# Patient Record
Sex: Female | Born: 1950 | Race: White | Hispanic: No | Smoking: Never smoker
Health system: Southern US, Community
[De-identification: ages and names within clinical notes are randomized; demographics above are authoritative.]

## PROBLEM LIST (undated history)

## (undated) DIAGNOSIS — E119 Type 2 diabetes mellitus without complications: Secondary | ICD-10-CM

## (undated) DIAGNOSIS — E785 Hyperlipidemia, unspecified: Secondary | ICD-10-CM

## (undated) DIAGNOSIS — F329 Major depressive disorder, single episode, unspecified: Secondary | ICD-10-CM

## (undated) DIAGNOSIS — G4730 Sleep apnea, unspecified: Secondary | ICD-10-CM

## (undated) DIAGNOSIS — F419 Anxiety disorder, unspecified: Secondary | ICD-10-CM

## (undated) DIAGNOSIS — M1990 Unspecified osteoarthritis, unspecified site: Secondary | ICD-10-CM

## (undated) DIAGNOSIS — F32A Depression, unspecified: Secondary | ICD-10-CM

## (undated) DIAGNOSIS — K30 Functional dyspepsia: Secondary | ICD-10-CM

## (undated) DIAGNOSIS — I10 Essential (primary) hypertension: Secondary | ICD-10-CM

## (undated) DIAGNOSIS — G43909 Migraine, unspecified, not intractable, without status migrainosus: Secondary | ICD-10-CM

## (undated) DIAGNOSIS — G629 Polyneuropathy, unspecified: Secondary | ICD-10-CM

## (undated) DIAGNOSIS — E559 Vitamin D deficiency, unspecified: Secondary | ICD-10-CM

## (undated) DIAGNOSIS — K219 Gastro-esophageal reflux disease without esophagitis: Secondary | ICD-10-CM

## (undated) DIAGNOSIS — H409 Unspecified glaucoma: Secondary | ICD-10-CM

## (undated) DIAGNOSIS — R569 Unspecified convulsions: Secondary | ICD-10-CM

## (undated) DIAGNOSIS — J449 Chronic obstructive pulmonary disease, unspecified: Secondary | ICD-10-CM

## (undated) HISTORY — DX: Type 2 diabetes mellitus without complications: E11.9

## (undated) HISTORY — DX: Chronic obstructive pulmonary disease, unspecified: J44.9

## (undated) HISTORY — DX: Essential (primary) hypertension: I10

## (undated) HISTORY — DX: Polyneuropathy, unspecified: G62.9

## (undated) HISTORY — DX: Major depressive disorder, single episode, unspecified: F32.9

## (undated) HISTORY — PX: KNEE ARTHROSCOPY: SUR90

## (undated) HISTORY — DX: Migraine, unspecified, not intractable, without status migrainosus: G43.909

## (undated) HISTORY — DX: Anxiety disorder, unspecified: F41.9

## (undated) HISTORY — DX: Hyperlipidemia, unspecified: E78.5

## (undated) HISTORY — DX: Gastro-esophageal reflux disease without esophagitis: K21.9

## (undated) HISTORY — PX: ORIF ANKLE FRACTURE: SUR919

## (undated) HISTORY — DX: Depression, unspecified: F32.A

## (undated) HISTORY — PX: ABDOMINAL HYSTERECTOMY: SHX81

## (undated) HISTORY — DX: Vitamin D deficiency, unspecified: E55.9

## (undated) HISTORY — DX: Functional dyspepsia: K30

---

## 2004-04-29 ENCOUNTER — Ambulatory Visit (HOSPITAL_COMMUNITY): Admission: RE | Admit: 2004-04-29 | Discharge: 2004-04-29 | Payer: Self-pay | Admitting: Family Medicine

## 2004-05-25 ENCOUNTER — Ambulatory Visit (HOSPITAL_COMMUNITY): Admission: RE | Admit: 2004-05-25 | Discharge: 2004-05-25 | Payer: Self-pay | Admitting: Family Medicine

## 2004-07-27 ENCOUNTER — Ambulatory Visit (HOSPITAL_COMMUNITY): Admission: RE | Admit: 2004-07-27 | Discharge: 2004-07-27 | Payer: Self-pay | Admitting: *Deleted

## 2004-08-14 ENCOUNTER — Ambulatory Visit: Admission: RE | Admit: 2004-08-14 | Discharge: 2004-08-14 | Payer: Self-pay | Admitting: *Deleted

## 2004-08-14 ENCOUNTER — Ambulatory Visit: Payer: Self-pay | Admitting: Pulmonary Disease

## 2004-09-27 ENCOUNTER — Ambulatory Visit (HOSPITAL_COMMUNITY): Admission: RE | Admit: 2004-09-27 | Discharge: 2004-09-27 | Payer: Self-pay | Admitting: Family Medicine

## 2004-10-05 ENCOUNTER — Ambulatory Visit (HOSPITAL_COMMUNITY): Admission: RE | Admit: 2004-10-05 | Discharge: 2004-10-05 | Payer: Self-pay | Admitting: Family Medicine

## 2004-10-14 ENCOUNTER — Ambulatory Visit: Payer: Self-pay | Admitting: Internal Medicine

## 2004-10-18 ENCOUNTER — Ambulatory Visit: Payer: Self-pay | Admitting: Internal Medicine

## 2004-10-18 ENCOUNTER — Ambulatory Visit (HOSPITAL_COMMUNITY): Admission: RE | Admit: 2004-10-18 | Discharge: 2004-10-18 | Payer: Self-pay | Admitting: Internal Medicine

## 2005-02-09 ENCOUNTER — Ambulatory Visit (HOSPITAL_COMMUNITY): Admission: RE | Admit: 2005-02-09 | Discharge: 2005-02-09 | Payer: Self-pay | Admitting: Family Medicine

## 2005-03-22 ENCOUNTER — Ambulatory Visit (HOSPITAL_COMMUNITY): Admission: RE | Admit: 2005-03-22 | Discharge: 2005-03-22 | Payer: Self-pay | Admitting: Family Medicine

## 2005-04-20 ENCOUNTER — Ambulatory Visit: Payer: Self-pay | Admitting: Internal Medicine

## 2006-07-10 ENCOUNTER — Ambulatory Visit: Payer: Self-pay | Admitting: Psychiatry

## 2006-07-10 ENCOUNTER — Emergency Department (HOSPITAL_COMMUNITY): Admission: EM | Admit: 2006-07-10 | Discharge: 2006-07-10 | Payer: Self-pay | Admitting: Emergency Medicine

## 2006-07-10 ENCOUNTER — Inpatient Hospital Stay (HOSPITAL_COMMUNITY): Admission: RE | Admit: 2006-07-10 | Discharge: 2006-07-14 | Payer: Self-pay | Admitting: Psychiatry

## 2015-08-13 DIAGNOSIS — E2839 Other primary ovarian failure: Secondary | ICD-10-CM | POA: Diagnosis not present

## 2015-08-14 DIAGNOSIS — R0602 Shortness of breath: Secondary | ICD-10-CM | POA: Diagnosis not present

## 2015-08-14 DIAGNOSIS — Z6835 Body mass index (BMI) 35.0-35.9, adult: Secondary | ICD-10-CM | POA: Diagnosis not present

## 2015-08-14 DIAGNOSIS — E559 Vitamin D deficiency, unspecified: Secondary | ICD-10-CM | POA: Diagnosis not present

## 2015-08-14 DIAGNOSIS — K760 Fatty (change of) liver, not elsewhere classified: Secondary | ICD-10-CM | POA: Diagnosis not present

## 2015-08-14 DIAGNOSIS — Z789 Other specified health status: Secondary | ICD-10-CM | POA: Diagnosis not present

## 2015-08-14 DIAGNOSIS — E1165 Type 2 diabetes mellitus with hyperglycemia: Secondary | ICD-10-CM | POA: Diagnosis not present

## 2015-08-20 DIAGNOSIS — R0602 Shortness of breath: Secondary | ICD-10-CM | POA: Diagnosis not present

## 2015-09-07 DIAGNOSIS — F209 Schizophrenia, unspecified: Secondary | ICD-10-CM | POA: Diagnosis not present

## 2015-09-23 DIAGNOSIS — R1084 Generalized abdominal pain: Secondary | ICD-10-CM | POA: Diagnosis not present

## 2015-09-23 DIAGNOSIS — E7800 Pure hypercholesterolemia, unspecified: Secondary | ICD-10-CM | POA: Diagnosis not present

## 2015-09-23 DIAGNOSIS — E1165 Type 2 diabetes mellitus with hyperglycemia: Secondary | ICD-10-CM | POA: Diagnosis not present

## 2015-09-23 DIAGNOSIS — K219 Gastro-esophageal reflux disease without esophagitis: Secondary | ICD-10-CM | POA: Diagnosis not present

## 2015-09-24 DIAGNOSIS — E559 Vitamin D deficiency, unspecified: Secondary | ICD-10-CM | POA: Diagnosis not present

## 2015-09-24 DIAGNOSIS — R7989 Other specified abnormal findings of blood chemistry: Secondary | ICD-10-CM | POA: Diagnosis not present

## 2015-09-25 DIAGNOSIS — R1084 Generalized abdominal pain: Secondary | ICD-10-CM | POA: Diagnosis not present

## 2015-09-25 LAB — HEPATIC FUNCTION PANEL
ALT: 66 U/L — AB (ref 7–35)
AST: 62 U/L — AB (ref 13–35)
Alkaline Phosphatase: 147 U/L — AB (ref 25–125)

## 2015-09-25 LAB — BASIC METABOLIC PANEL
BUN: 19 mg/dL (ref 4–21)
Creatinine: 0.6 mg/dL (ref <=1.1)

## 2015-10-08 DIAGNOSIS — F329 Major depressive disorder, single episode, unspecified: Secondary | ICD-10-CM | POA: Diagnosis not present

## 2015-10-08 DIAGNOSIS — I10 Essential (primary) hypertension: Secondary | ICD-10-CM | POA: Diagnosis not present

## 2015-10-08 DIAGNOSIS — E1165 Type 2 diabetes mellitus with hyperglycemia: Secondary | ICD-10-CM | POA: Diagnosis not present

## 2015-10-08 DIAGNOSIS — R350 Frequency of micturition: Secondary | ICD-10-CM | POA: Diagnosis not present

## 2015-10-16 DIAGNOSIS — E1165 Type 2 diabetes mellitus with hyperglycemia: Secondary | ICD-10-CM | POA: Diagnosis not present

## 2015-10-16 DIAGNOSIS — N952 Postmenopausal atrophic vaginitis: Secondary | ICD-10-CM | POA: Diagnosis not present

## 2015-10-16 DIAGNOSIS — F419 Anxiety disorder, unspecified: Secondary | ICD-10-CM | POA: Diagnosis not present

## 2015-10-16 DIAGNOSIS — R109 Unspecified abdominal pain: Secondary | ICD-10-CM | POA: Diagnosis not present

## 2015-10-19 DIAGNOSIS — R7989 Other specified abnormal findings of blood chemistry: Secondary | ICD-10-CM | POA: Diagnosis not present

## 2015-10-19 DIAGNOSIS — N281 Cyst of kidney, acquired: Secondary | ICD-10-CM | POA: Diagnosis not present

## 2015-10-19 DIAGNOSIS — R945 Abnormal results of liver function studies: Secondary | ICD-10-CM | POA: Diagnosis not present

## 2015-10-19 DIAGNOSIS — K760 Fatty (change of) liver, not elsewhere classified: Secondary | ICD-10-CM | POA: Diagnosis not present

## 2015-10-21 DIAGNOSIS — Z800 Family history of malignant neoplasm of digestive organs: Secondary | ICD-10-CM | POA: Diagnosis not present

## 2015-10-21 DIAGNOSIS — R109 Unspecified abdominal pain: Secondary | ICD-10-CM | POA: Diagnosis not present

## 2015-10-21 DIAGNOSIS — G8929 Other chronic pain: Secondary | ICD-10-CM | POA: Diagnosis not present

## 2015-11-03 DIAGNOSIS — N952 Postmenopausal atrophic vaginitis: Secondary | ICD-10-CM | POA: Diagnosis not present

## 2015-11-03 DIAGNOSIS — Z299 Encounter for prophylactic measures, unspecified: Secondary | ICD-10-CM | POA: Diagnosis not present

## 2015-11-08 DIAGNOSIS — E119 Type 2 diabetes mellitus without complications: Secondary | ICD-10-CM | POA: Diagnosis not present

## 2015-11-08 DIAGNOSIS — Z79899 Other long term (current) drug therapy: Secondary | ICD-10-CM | POA: Diagnosis not present

## 2015-11-08 DIAGNOSIS — R1030 Lower abdominal pain, unspecified: Secondary | ICD-10-CM | POA: Diagnosis not present

## 2015-11-08 DIAGNOSIS — F329 Major depressive disorder, single episode, unspecified: Secondary | ICD-10-CM | POA: Diagnosis not present

## 2015-11-08 DIAGNOSIS — Z794 Long term (current) use of insulin: Secondary | ICD-10-CM | POA: Diagnosis not present

## 2015-11-08 DIAGNOSIS — K529 Noninfective gastroenteritis and colitis, unspecified: Secondary | ICD-10-CM | POA: Diagnosis not present

## 2015-11-13 DIAGNOSIS — E1165 Type 2 diabetes mellitus with hyperglycemia: Secondary | ICD-10-CM | POA: Diagnosis not present

## 2015-11-13 DIAGNOSIS — A09 Infectious gastroenteritis and colitis, unspecified: Secondary | ICD-10-CM | POA: Diagnosis not present

## 2015-11-13 DIAGNOSIS — F329 Major depressive disorder, single episode, unspecified: Secondary | ICD-10-CM | POA: Diagnosis not present

## 2015-11-13 DIAGNOSIS — Z299 Encounter for prophylactic measures, unspecified: Secondary | ICD-10-CM | POA: Diagnosis not present

## 2015-11-15 DIAGNOSIS — Z882 Allergy status to sulfonamides status: Secondary | ICD-10-CM | POA: Diagnosis not present

## 2015-11-15 DIAGNOSIS — R1010 Upper abdominal pain, unspecified: Secondary | ICD-10-CM | POA: Diagnosis not present

## 2015-11-15 DIAGNOSIS — D72829 Elevated white blood cell count, unspecified: Secondary | ICD-10-CM | POA: Diagnosis not present

## 2015-11-15 DIAGNOSIS — Z811 Family history of alcohol abuse and dependence: Secondary | ICD-10-CM | POA: Diagnosis not present

## 2015-11-15 DIAGNOSIS — Z794 Long term (current) use of insulin: Secondary | ICD-10-CM | POA: Diagnosis not present

## 2015-11-15 DIAGNOSIS — Z800 Family history of malignant neoplasm of digestive organs: Secondary | ICD-10-CM | POA: Diagnosis not present

## 2015-11-15 DIAGNOSIS — Z87440 Personal history of urinary (tract) infections: Secondary | ICD-10-CM | POA: Diagnosis not present

## 2015-11-15 DIAGNOSIS — F419 Anxiety disorder, unspecified: Secondary | ICD-10-CM | POA: Diagnosis not present

## 2015-11-15 DIAGNOSIS — R109 Unspecified abdominal pain: Secondary | ICD-10-CM | POA: Diagnosis not present

## 2015-11-15 DIAGNOSIS — K219 Gastro-esophageal reflux disease without esophagitis: Secondary | ICD-10-CM | POA: Diagnosis not present

## 2015-11-15 DIAGNOSIS — Z87891 Personal history of nicotine dependence: Secondary | ICD-10-CM | POA: Diagnosis not present

## 2015-11-15 DIAGNOSIS — Z8249 Family history of ischemic heart disease and other diseases of the circulatory system: Secondary | ICD-10-CM | POA: Diagnosis not present

## 2015-11-15 DIAGNOSIS — Z888 Allergy status to other drugs, medicaments and biological substances status: Secondary | ICD-10-CM | POA: Diagnosis not present

## 2015-11-15 DIAGNOSIS — R110 Nausea: Secondary | ICD-10-CM | POA: Diagnosis not present

## 2015-11-15 DIAGNOSIS — F329 Major depressive disorder, single episode, unspecified: Secondary | ICD-10-CM | POA: Diagnosis not present

## 2015-11-15 DIAGNOSIS — Z90711 Acquired absence of uterus with remaining cervical stump: Secondary | ICD-10-CM | POA: Diagnosis not present

## 2015-11-15 DIAGNOSIS — K529 Noninfective gastroenteritis and colitis, unspecified: Secondary | ICD-10-CM | POA: Diagnosis not present

## 2015-11-15 DIAGNOSIS — E7800 Pure hypercholesterolemia, unspecified: Secondary | ICD-10-CM | POA: Diagnosis not present

## 2015-11-15 DIAGNOSIS — I10 Essential (primary) hypertension: Secondary | ICD-10-CM | POA: Diagnosis not present

## 2015-11-15 DIAGNOSIS — K449 Diaphragmatic hernia without obstruction or gangrene: Secondary | ICD-10-CM | POA: Diagnosis not present

## 2015-11-15 DIAGNOSIS — E119 Type 2 diabetes mellitus without complications: Secondary | ICD-10-CM | POA: Diagnosis not present

## 2015-11-15 DIAGNOSIS — Z79899 Other long term (current) drug therapy: Secondary | ICD-10-CM | POA: Diagnosis not present

## 2015-11-16 DIAGNOSIS — K529 Noninfective gastroenteritis and colitis, unspecified: Secondary | ICD-10-CM | POA: Diagnosis not present

## 2015-11-16 DIAGNOSIS — I10 Essential (primary) hypertension: Secondary | ICD-10-CM | POA: Diagnosis not present

## 2015-11-17 DIAGNOSIS — E7800 Pure hypercholesterolemia, unspecified: Secondary | ICD-10-CM | POA: Diagnosis not present

## 2015-11-17 DIAGNOSIS — I10 Essential (primary) hypertension: Secondary | ICD-10-CM | POA: Diagnosis not present

## 2015-11-17 DIAGNOSIS — E119 Type 2 diabetes mellitus without complications: Secondary | ICD-10-CM | POA: Diagnosis not present

## 2015-11-20 DIAGNOSIS — G8929 Other chronic pain: Secondary | ICD-10-CM | POA: Diagnosis not present

## 2015-11-20 DIAGNOSIS — K529 Noninfective gastroenteritis and colitis, unspecified: Secondary | ICD-10-CM | POA: Diagnosis not present

## 2015-11-20 DIAGNOSIS — R109 Unspecified abdominal pain: Secondary | ICD-10-CM | POA: Diagnosis not present

## 2015-11-23 DIAGNOSIS — R1032 Left lower quadrant pain: Secondary | ICD-10-CM | POA: Diagnosis not present

## 2015-11-23 DIAGNOSIS — Z90710 Acquired absence of both cervix and uterus: Secondary | ICD-10-CM | POA: Diagnosis not present

## 2015-11-23 DIAGNOSIS — R1031 Right lower quadrant pain: Secondary | ICD-10-CM | POA: Diagnosis not present

## 2015-11-23 DIAGNOSIS — R102 Pelvic and perineal pain: Secondary | ICD-10-CM | POA: Diagnosis not present

## 2015-11-23 HISTORY — PX: ESOPHAGOGASTRODUODENOSCOPY: SHX1529

## 2015-11-23 HISTORY — PX: COLONOSCOPY: SHX174

## 2015-11-30 DIAGNOSIS — F209 Schizophrenia, unspecified: Secondary | ICD-10-CM | POA: Diagnosis not present

## 2015-12-01 DIAGNOSIS — Z794 Long term (current) use of insulin: Secondary | ICD-10-CM | POA: Diagnosis not present

## 2015-12-01 DIAGNOSIS — Z809 Family history of malignant neoplasm, unspecified: Secondary | ICD-10-CM | POA: Diagnosis not present

## 2015-12-01 DIAGNOSIS — G8929 Other chronic pain: Secondary | ICD-10-CM | POA: Diagnosis not present

## 2015-12-01 DIAGNOSIS — I10 Essential (primary) hypertension: Secondary | ICD-10-CM | POA: Diagnosis not present

## 2015-12-01 DIAGNOSIS — D123 Benign neoplasm of transverse colon: Secondary | ICD-10-CM | POA: Diagnosis not present

## 2015-12-01 DIAGNOSIS — F419 Anxiety disorder, unspecified: Secondary | ICD-10-CM | POA: Diagnosis not present

## 2015-12-01 DIAGNOSIS — Z79899 Other long term (current) drug therapy: Secondary | ICD-10-CM | POA: Diagnosis not present

## 2015-12-01 DIAGNOSIS — E7800 Pure hypercholesterolemia, unspecified: Secondary | ICD-10-CM | POA: Diagnosis not present

## 2015-12-01 DIAGNOSIS — Z888 Allergy status to other drugs, medicaments and biological substances status: Secondary | ICD-10-CM | POA: Diagnosis not present

## 2015-12-01 DIAGNOSIS — E119 Type 2 diabetes mellitus without complications: Secondary | ICD-10-CM | POA: Diagnosis not present

## 2015-12-01 DIAGNOSIS — K219 Gastro-esophageal reflux disease without esophagitis: Secondary | ICD-10-CM | POA: Diagnosis not present

## 2015-12-01 DIAGNOSIS — J449 Chronic obstructive pulmonary disease, unspecified: Secondary | ICD-10-CM | POA: Diagnosis not present

## 2015-12-01 DIAGNOSIS — Z882 Allergy status to sulfonamides status: Secondary | ICD-10-CM | POA: Diagnosis not present

## 2015-12-01 DIAGNOSIS — E559 Vitamin D deficiency, unspecified: Secondary | ICD-10-CM | POA: Diagnosis not present

## 2015-12-01 DIAGNOSIS — R109 Unspecified abdominal pain: Secondary | ICD-10-CM | POA: Diagnosis not present

## 2015-12-01 DIAGNOSIS — K529 Noninfective gastroenteritis and colitis, unspecified: Secondary | ICD-10-CM | POA: Diagnosis not present

## 2015-12-01 DIAGNOSIS — F329 Major depressive disorder, single episode, unspecified: Secondary | ICD-10-CM | POA: Diagnosis not present

## 2015-12-01 DIAGNOSIS — Z86010 Personal history of colonic polyps: Secondary | ICD-10-CM | POA: Diagnosis not present

## 2015-12-01 DIAGNOSIS — K449 Diaphragmatic hernia without obstruction or gangrene: Secondary | ICD-10-CM | POA: Diagnosis not present

## 2015-12-07 DIAGNOSIS — I10 Essential (primary) hypertension: Secondary | ICD-10-CM | POA: Diagnosis not present

## 2015-12-07 DIAGNOSIS — R102 Pelvic and perineal pain: Secondary | ICD-10-CM | POA: Diagnosis not present

## 2015-12-07 DIAGNOSIS — R1010 Upper abdominal pain, unspecified: Secondary | ICD-10-CM | POA: Diagnosis not present

## 2015-12-07 DIAGNOSIS — E7800 Pure hypercholesterolemia, unspecified: Secondary | ICD-10-CM | POA: Diagnosis not present

## 2015-12-07 DIAGNOSIS — E119 Type 2 diabetes mellitus without complications: Secondary | ICD-10-CM | POA: Diagnosis not present

## 2016-01-12 DIAGNOSIS — E1142 Type 2 diabetes mellitus with diabetic polyneuropathy: Secondary | ICD-10-CM | POA: Diagnosis not present

## 2016-01-12 LAB — HEMOGLOBIN A1C: Hemoglobin A1C: 10.3

## 2016-01-27 DIAGNOSIS — Z299 Encounter for prophylactic measures, unspecified: Secondary | ICD-10-CM | POA: Diagnosis not present

## 2016-01-27 DIAGNOSIS — J019 Acute sinusitis, unspecified: Secondary | ICD-10-CM | POA: Diagnosis not present

## 2016-01-27 DIAGNOSIS — Z87891 Personal history of nicotine dependence: Secondary | ICD-10-CM | POA: Diagnosis not present

## 2016-02-01 DIAGNOSIS — F329 Major depressive disorder, single episode, unspecified: Secondary | ICD-10-CM | POA: Diagnosis not present

## 2016-02-01 DIAGNOSIS — R51 Headache: Secondary | ICD-10-CM | POA: Diagnosis not present

## 2016-02-01 DIAGNOSIS — E7800 Pure hypercholesterolemia, unspecified: Secondary | ICD-10-CM | POA: Diagnosis not present

## 2016-02-01 DIAGNOSIS — E1165 Type 2 diabetes mellitus with hyperglycemia: Secondary | ICD-10-CM | POA: Diagnosis not present

## 2016-02-04 DIAGNOSIS — E119 Type 2 diabetes mellitus without complications: Secondary | ICD-10-CM | POA: Diagnosis not present

## 2016-02-04 DIAGNOSIS — H538 Other visual disturbances: Secondary | ICD-10-CM | POA: Diagnosis not present

## 2016-02-05 DIAGNOSIS — H538 Other visual disturbances: Secondary | ICD-10-CM | POA: Diagnosis not present

## 2016-02-05 DIAGNOSIS — R51 Headache: Secondary | ICD-10-CM | POA: Diagnosis not present

## 2016-02-05 DIAGNOSIS — G9389 Other specified disorders of brain: Secondary | ICD-10-CM | POA: Diagnosis not present

## 2016-02-23 ENCOUNTER — Ambulatory Visit: Payer: Self-pay | Admitting: "Endocrinology

## 2016-02-23 DIAGNOSIS — F209 Schizophrenia, unspecified: Secondary | ICD-10-CM | POA: Diagnosis not present

## 2016-03-03 DIAGNOSIS — I10 Essential (primary) hypertension: Secondary | ICD-10-CM | POA: Diagnosis not present

## 2016-03-03 DIAGNOSIS — E7800 Pure hypercholesterolemia, unspecified: Secondary | ICD-10-CM | POA: Diagnosis not present

## 2016-03-03 DIAGNOSIS — E119 Type 2 diabetes mellitus without complications: Secondary | ICD-10-CM | POA: Diagnosis not present

## 2016-03-04 ENCOUNTER — Encounter: Payer: Self-pay | Admitting: "Endocrinology

## 2016-03-04 ENCOUNTER — Encounter: Payer: Medicare Other | Attending: "Endocrinology | Admitting: Nutrition

## 2016-03-04 ENCOUNTER — Ambulatory Visit (INDEPENDENT_AMBULATORY_CARE_PROVIDER_SITE_OTHER): Payer: Medicare Other | Admitting: "Endocrinology

## 2016-03-04 VITALS — BP 106/71 | HR 105 | Ht 62.5 in | Wt 191.0 lb

## 2016-03-04 VITALS — Ht 62.0 in | Wt 191.0 lb

## 2016-03-04 DIAGNOSIS — Z713 Dietary counseling and surveillance: Secondary | ICD-10-CM | POA: Insufficient documentation

## 2016-03-04 DIAGNOSIS — E785 Hyperlipidemia, unspecified: Secondary | ICD-10-CM | POA: Diagnosis not present

## 2016-03-04 DIAGNOSIS — E1165 Type 2 diabetes mellitus with hyperglycemia: Secondary | ICD-10-CM

## 2016-03-04 DIAGNOSIS — E669 Obesity, unspecified: Secondary | ICD-10-CM | POA: Diagnosis not present

## 2016-03-04 DIAGNOSIS — E118 Type 2 diabetes mellitus with unspecified complications: Secondary | ICD-10-CM

## 2016-03-04 DIAGNOSIS — I10 Essential (primary) hypertension: Secondary | ICD-10-CM | POA: Diagnosis not present

## 2016-03-04 DIAGNOSIS — Z794 Long term (current) use of insulin: Secondary | ICD-10-CM

## 2016-03-04 DIAGNOSIS — R740 Nonspecific elevation of levels of transaminase and lactic acid dehydrogenase [LDH]: Secondary | ICD-10-CM

## 2016-03-04 DIAGNOSIS — E6609 Other obesity due to excess calories: Secondary | ICD-10-CM | POA: Insufficient documentation

## 2016-03-04 DIAGNOSIS — R7401 Elevation of levels of liver transaminase levels: Secondary | ICD-10-CM | POA: Insufficient documentation

## 2016-03-04 DIAGNOSIS — IMO0002 Reserved for concepts with insufficient information to code with codable children: Secondary | ICD-10-CM

## 2016-03-04 DIAGNOSIS — E119 Type 2 diabetes mellitus without complications: Secondary | ICD-10-CM | POA: Diagnosis not present

## 2016-03-04 DIAGNOSIS — Z6834 Body mass index (BMI) 34.0-34.9, adult: Secondary | ICD-10-CM

## 2016-03-04 DIAGNOSIS — E782 Mixed hyperlipidemia: Secondary | ICD-10-CM | POA: Insufficient documentation

## 2016-03-04 MED ORDER — INSULIN ASPART 100 UNIT/ML FLEXPEN: 5.0000 [IU] | PEN_INJECTOR | Freq: Three times a day (TID) | SUBCUTANEOUS | 3 refills | Status: DC

## 2016-03-04 MED ORDER — CANAGLIFLOZIN 100 MG PO TABS: 100.0000 mg | ORAL_TABLET | Freq: Every day | ORAL | 2 refills | Status: DC

## 2016-03-04 NOTE — Patient Instructions (Signed)

## 2016-03-04 NOTE — Progress Notes (Signed)
Subjective:    Patient ID: Carrie Turner, female    DOB: [birth year]. Patient is being seen in consultation for management of diabetes requested by  [facility], MD  Past Medical History:  Diagnosis Date  . Anxiety   . Depression   . Diabetes mellitus, type II ([place])   . GERD (gastroesophageal reflux disease)   . Hyperlipidemia   . Hypertension   . Vitamin D deficiency    Past Surgical History:  Procedure Laterality Date  . ABDOMINAL HYSTERECTOMY     Social History   Social History  . Marital status: Single    Spouse name: N/A  . Number of children: N/A  . Years of education: N/A   Social History Main Topics  . Smoking status: Never Smoker  . Smokeless tobacco: Never Used  . Alcohol use None  . Drug use: Unknown  . Sexual activity: Not Asked   Other Topics Concern  . None   Social History Narrative  . None   Outpatient Encounter Prescriptions as of 03/04/2016  Medication Sig  . amLODipine (NORVASC) 2.5 MG tablet Take 2.5 mg by mouth daily.  . Aspirin-Acetaminophen-Caffeine (EXCEDRIN MIGRAINE PO) Take by mouth.  . citalopram (CELEXA) 10 MG tablet Take 10 mg by mouth daily.  Marland Kitchen gabapentin (NEURONTIN) 100 MG capsule Take 300 mg by mouth at bedtime.  . insulin aspart (NOVOLOG FLEXPEN) 100 UNIT/ML FlexPen Inject 5-11 Units into the skin 3 (three) times daily with meals.  . Insulin Detemir (LEVEMIR FLEXTOUCH) 100 UNIT/ML Pen Inject 30 Units into the skin at bedtime.  Marland Kitchen omeprazole (PRILOSEC) 20 MG capsule Take 20 mg by mouth 2 (two) times daily before a meal.  . [DISCONTINUED] canagliflozin (INVOKANA) 300 MG TABS tablet Take 300 mg by mouth daily before breakfast.  . [DISCONTINUED] insulin aspart (NOVOLOG FLEXPEN) 100 UNIT/ML FlexPen Inject 5-11 Units into the skin 3 (three) times daily with meals.  . [DISCONTINUED] metFORMIN (GLUCOPHAGE) 500 MG tablet Take by mouth 2 (two) times daily with a meal.  . canagliflozin (INVOKANA) 100 MG TABS tablet Take 1 tablet (100 mg  total) by mouth daily before breakfast.   No facility-administered encounter medications on file as of 03/04/2016.    ALLERGIES: Allergies  Allergen Reactions  . Sulfa Antibiotics    VACCINATION STATUS:  There is no immunization history on file for this patient.  Diabetes  She presents for her initial diabetic visit. She has type 2 diabetes mellitus. Onset time: She was diagnosed at approximate age of 58 years. Her disease course has been worsening. There are no hypoglycemic associated symptoms. Pertinent negatives for hypoglycemia include no confusion, headaches, pallor or seizures. Associated symptoms include blurred vision, fatigue, polydipsia and polyuria. Pertinent negatives for diabetes include no chest pain and no polyphagia. There are no hypoglycemic complications. Symptoms are worsening. There are no diabetic complications. Risk factors for coronary artery disease include diabetes mellitus, dyslipidemia, obesity and sedentary lifestyle. Current diabetic treatment includes insulin injections (She is on Levemir 20 units, NovoLog 5 units with meals, and vocal 300 mg daily, metformin 1000 g by mouth twice a day.). Her weight is increasing steadily. She is following a generally unhealthy diet. When asked about meal planning, she reported none. She has not had a previous visit with a dietitian (She will see the dietitian today.). She never participates in exercise. Her home blood glucose trend is increasing steadily. Her breakfast blood glucose range is generally >200 mg/dl. Her lunch blood glucose range is generally >200 mg/dl. Her  dinner blood glucose range is generally >200 mg/dl. Her overall blood glucose range is >200 mg/dl. An ACE inhibitor/angiotensin II receptor blocker is not being taken. Eye exam is current.  Hyperlipidemia  This is a chronic problem. The current episode started more than 1 year ago. Recent lipid tests were reviewed and are high. Exacerbating diseases include diabetes and  obesity. Pertinent negatives include no chest pain, myalgias or shortness of breath. She is currently on no antihyperlipidemic treatment. Compliance problems include adherence to diet and adherence to exercise.  Risk factors for coronary artery disease include diabetes mellitus, dyslipidemia, hypertension, obesity and a sedentary lifestyle.  Hypertension  This is a chronic problem. The current episode started more than 1 year ago. The problem is controlled. Associated symptoms include blurred vision. Pertinent negatives include no chest pain, headaches, palpitations or shortness of breath. Risk factors for coronary artery disease include diabetes mellitus, dyslipidemia and sedentary lifestyle. Past treatments include calcium channel blockers. Compliance problems include diet and exercise.        Review of Systems  Constitutional: Positive for fatigue. Negative for unexpected weight change.  HENT: Negative for trouble swallowing and voice change.   Eyes: Positive for blurred vision. Negative for visual disturbance.  Respiratory: Negative for cough, shortness of breath and wheezing.   Cardiovascular: Negative for chest pain, palpitations and leg swelling.  Gastrointestinal: Negative for diarrhea, nausea and vomiting.  Endocrine: Positive for polydipsia and polyuria. Negative for cold intolerance, heat intolerance and polyphagia.  Genitourinary: Positive for frequency. Negative for dysuria and flank pain.  Musculoskeletal: Negative for arthralgias and myalgias.  Skin: Negative for color change, pallor, rash and wound.  Neurological: Negative for seizures and headaches.  Psychiatric/Behavioral: Negative for confusion and suicidal ideas.    Objective:    BP 106/71   Pulse (!) 105   Ht 5' 2.5" (1.588 m)   Wt 191 lb (86.6 kg)   BMI 34.38 kg/m   Wt Readings from Last 3 Encounters:  03/04/16 191 lb (86.6 kg)    Physical Exam  Constitutional: She is oriented to person, place, and time. She  appears well-developed.  HENT:  Head: Normocephalic and atraumatic.  Eyes: EOM are normal.  Neck: Normal range of motion. Neck supple. No tracheal deviation present. No thyromegaly present.  Cardiovascular: Normal rate and regular rhythm.   Pulmonary/Chest: Effort normal and breath sounds normal.  Abdominal: Soft. Bowel sounds are normal. There is no tenderness. There is no guarding.  Musculoskeletal: Normal range of motion. She exhibits no edema.  Neurological: She is alert and oriented to person, place, and time. She has normal reflexes. No cranial nerve deficit. Coordination normal.  Skin: Skin is warm and dry. No rash noted. No erythema. No pallor.  Psychiatric: She has a normal mood and affect. Judgment normal.    A1c was 10.3% on 01/12/2016, vitamin D low at 20.8 on 09/25/2015, liver function test was abnormal with alkaline phosphatase 147 (normal 39-1 17), AST 62, ALT 66, A1c 9.9% on 09/23/2015     Assessment & Plan:   1. Uncontrolled type 2 diabetes mellitus with complication, with long-term current use of insulin ([place])  - Patient has currently uncontrolled symptomatic type 2 DM since  65 years of age,  with most recent A1c of 10.3 %. Recent labs reviewed.   Her diabetes is complicated by obesity and sedentary life and patient remains at a high risk for more acute and chronic complications of diabetes which include CAD, CVA, CKD, retinopathy, and neuropathy. These are  all discussed in detail with the patient.  - I have counseled the patient on diet management and weight loss, by adopting a carbohydrate restricted/protein rich diet.  - Suggestion is made for patient to avoid simple carbohydrates   from their diet including Cakes , Desserts, Ice Cream,  Soda (  diet and regular) , Sweet Tea , Candies,  Chips, Cookies, Artificial Sweeteners,   and "Sugar-free" Products . This will help patient to have stable blood glucose profile and potentially avoid unintended weight gain.  - I  encouraged the patient to switch to  unprocessed or minimally processed complex starch and increased protein intake (animal or plant source), fruits, and vegetables.  - Patient is advised to stick to a routine mealtimes to eat 3 meals  a day and avoid unnecessary snacks ( to snack only to correct hypoglycemia).  - The patient will be scheduled with Jearld Fenton, RDN, CDE for individualized DM education.  - I have approached patient with the following individualized plan to manage diabetes and patient agrees:   - Given her abnormal liver function test, I will discontinue her metformin for now. -She will continue to require basal/bolus insulin. - I  will proceed to readjust her basal insulin Levemir to 30 units QHS, and prandial insulin NovoLog to 5 units TIDAC for pre-meal BG readings of 90-150mg /dl, plus patient specific correction dose for unexpected hyperglycemia above 150mg /dl, associated with strict monitoring of glucose  AC and HS. - Patient is warned not to take insulin without proper monitoring per orders. -Adjustment parameters are given for hypo and hyperglycemia in writing. -Patient is encouraged to call clinic for blood glucose levels less than 70 or above 300 mg /dl. - I will lower and continue Invokana to 100 milligrams by mouth every morning with breakfast, therapeutically suitable for patient- side effects and precautions discussed with her. - Patient will be considered for incretin therapy as appropriate next visit. - Patient specific target  A1c;  LDL, HDL, Triglycerides, and  Waist Circumference were discussed in detail.  2) BP/HTN: Controlled. She will be considered for ACE inhibitor/ARB next visit.  3) Lipids/HPL:  Controlled unknown, I would obtain fasting lipid panel on subsequent visits. She is not on statins likely due to a concern of elevated transaminases. -However she will be reassessed for utility of statins after her next labs. 4)  obesity: CDE Consult will be  initiated , exercise, and detailed carbohydrates information provided.  5) Chronic Care/Health Maintenance:  -Patient is encouraged to continue to follow up with Ophthalmology, Podiatrist at least yearly or according to recommendations, and advised to   stay away from smoking. I have recommended yearly flu vaccine and pneumonia vaccination at least every 5 years; moderate intensity exercise for up to 150 minutes weekly; and  sleep for at least 7 hours a day.  - 60 minutes of time was spent on the care of this patient , 50% of which was applied for counseling on diabetes complications and their preventions.  - Patient to bring meter and  blood glucose logs during their next visit.   - I advised patient to maintain close follow up with [facility], MD for primary care needs.  Follow up plan: - Return in about 3 weeks (around 03/25/2016) for follow up with meter and logs- no labs.  Glade Lloyd, MD Phone: [phone]  Fax: [phone]   03/04/2016, 4:14 PM

## 2016-03-09 ENCOUNTER — Other Ambulatory Visit: Payer: Self-pay

## 2016-03-09 ENCOUNTER — Encounter: Payer: Medicare Other | Admitting: Nutrition

## 2016-03-09 VITALS — Ht 62.0 in | Wt 194.0 lb

## 2016-03-09 DIAGNOSIS — IMO0002 Reserved for concepts with insufficient information to code with codable children: Secondary | ICD-10-CM

## 2016-03-09 DIAGNOSIS — Z794 Long term (current) use of insulin: Principal | ICD-10-CM

## 2016-03-09 DIAGNOSIS — E119 Type 2 diabetes mellitus without complications: Secondary | ICD-10-CM | POA: Diagnosis not present

## 2016-03-09 DIAGNOSIS — E1165 Type 2 diabetes mellitus with hyperglycemia: Secondary | ICD-10-CM

## 2016-03-09 DIAGNOSIS — E118 Type 2 diabetes mellitus with unspecified complications: Principal | ICD-10-CM

## 2016-03-09 DIAGNOSIS — G43919 Migraine, unspecified, intractable, without status migrainosus: Secondary | ICD-10-CM | POA: Diagnosis not present

## 2016-03-09 DIAGNOSIS — E669 Obesity, unspecified: Secondary | ICD-10-CM

## 2016-03-09 DIAGNOSIS — Z713 Dietary counseling and surveillance: Secondary | ICD-10-CM | POA: Diagnosis not present

## 2016-03-09 MED ORDER — INSULIN ASPART 100 UNIT/ML FLEXPEN: 5.0000 [IU] | PEN_INJECTOR | Freq: Three times a day (TID) | SUBCUTANEOUS | 3 refills | Status: DC

## 2016-03-09 NOTE — Patient Instructions (Addendum)
Goals 1`. Don't take Novolog without eating a meat. 2. Eat three meals per day and don't skip meals. 3. Take insulin before meals. 4. Eat 2-3 carb choices per meal 5. May consider protein shake or slim fast 6. Follow Sliding for insulin. 7/ Get A1C less than 8%.

## 2016-03-09 NOTE — Progress Notes (Signed)
Diabetes Self-Management Education  Visit Type: Follow-up  Appt. Start Time:1400 Appt. End Time:  1500  03/17/2016  Carrie Turner Books, identified by name and date of birth, is a 65 y.o. female with a diagnosis of Diabetes:  .   ASSESSMENT  Height 5\' 2"  (1.575 m), weight 194 lb (88 kg). Body mass index is 35.48 kg/m.      Diabetes Self-Management Education - 03/09/16 1435      Visit Information   Visit Type Follow-up     Health Coping   How would you rate your overall health? Good     Psychosocial Assessment   Patient Belief/Attitude about Diabetes (P)  Motivated to manage diabetes   Self-care barriers (P)  None   Self-management support (P)  Doctor's office;Family   Other persons present (P)  Patient   Patient Concerns (P)  Nutrition/Meal planning;Medication;Monitoring;Healthy Lifestyle   Special Needs (P)  None   Preferred Learning Style (P)  No preference indicated   Learning Readiness (P)  Ready   How often do you need to have someone help you when you read instructions, pamphlets, or other written materials from your doctor or pharmacy? (P)  1 - Never     Pre-Education Assessment   Patient understands the diabetes disease and treatment process. (P)  Needs Instruction   Patient understands incorporating nutritional management into lifestyle. (P)  Needs Instruction   Patient undertands incorporating physical activity into lifestyle. (P)  Needs Instruction   Patient understands using medications safely. (P)  Needs Instruction   Patient understands monitoring blood glucose, interpreting and using results (P)  Needs Instruction   Patient understands prevention, detection, and treatment of acute complications. (P)  Needs Instruction   Patient understands prevention, detection, and treatment of chronic complications. (P)  Needs Instruction   Patient understands how to develop strategies to address psychosocial issues. (P)  Needs Instruction   Patient understands how to  develop strategies to promote health/change behavior. (P)  Needs Instruction     Complications   How often do you check your blood sugar? (P)  1-2 times/day   Fasting Blood glucose range (mg/dL) (P)  130-179   Postprandial Blood glucose range (mg/dL) (P)  180-200   Number of hyperglycemic episodes per week (P)  5   Can you tell when your blood sugar is high? (P)  Yes   What do you do if your blood sugar is high? (P)  water   Have you had a dilated eye exam in the past 12 months? (P)  No   Have you had a dental exam in the past 12 months? (P)  No   Are you checking your feet? (P)  Yes     Dietary Intake   Lunch pork chop, Broccoli, , and fruit, water   Dinner  skipped     Subsequent Visit   Since your last visit have you continued or begun to take your medications as prescribed? Yes   Since your last visit have you had your blood pressure checked? Yes   Is your most recent blood pressure lower, unchanged, or higher since your last visit? Lower   Since your last visit have you experienced any weight changes? (P)  Gain   Weight Gain (lbs) (P)  3   Since your last visit, are you checking your blood glucose at least once a day? (P)  Yes      Individualized Plan for Diabetes Self-Management Training:   Learning Objective:  Patient will have  a greater understanding of diabetes self-management. Patient education plan is to attend individual and/or group sessions per assessed needs and concerns.   Plan:   Patient Instructions  Goals 1`. Don't take Novolog without eating a meat. 2. Eat three meals per day and don't skip meals. 3. Take insulin before meals. 4. Eat 2-3 carb choices per meal 5. May consider protein shake or slim fast 6. Follow Sliding for insulin. 7/ Get A1C less than 8%.   Expected Outcomes:    Lower A1C, weight loss and improved overall health and increased DM knowledge  Education material provided: Living Well with Diabetes, Food label handouts, A1C conversion  sheet, Meal plan card and My Plate  If problems or questions, patient to contact team via:  Phone and Email  Future DSME appointment:

## 2016-03-18 NOTE — Progress Notes (Signed)
  Medical Nutrition Therapy:  Appt start time: 1400 end time:  1430.   Assessment:  Primary concerns today: DM. Here to see Dr. Dorris Fetch today. Walk in visit. WIll answer safety questions next visit.    Lab Results  Component Value Date   HGBA1C 10.3 01/12/2016    Preferred Learning Style:     No preference indicated   Learning Readiness:     Ready  Change in progress   MEDICATIONS: see list   DIETARY INTAKE:  Eats 2-3 meals per day.  Usual physical activity: ADL  Estimated energy needs: 1500 calories 170 g carbohydrates 112 g protein 42 g fat  Progress Towards Goal(s):  In progress.   Nutritional Diagnosis:  NB-1.1 Food and nutrition-related knowledge deficit As related to Diabetes.  As evidenced by A1C.    Intervention:  Nutrition and Diabetes education provided on My Plate, CHO counting, meal planning, portion sizes, timing of meals, avoiding snacks between meals unless having a low blood sugar, target ranges for A1C and blood sugars, signs/symptoms and treatment of hyper/hypoglycemia, monitoring blood sugars, taking medications as prescribed, benefits of exercising 30 minutes per day and prevention of complications of DM.  Goal Plan:  Aim for 2-3 Carb Choices per meal (30-45 grams) +/- 1 either way  Include protein in moderation with your meals and snacks Consider reading food labels for Total Carbohydrate and Fat Grams of foods Consider  increasing your activity level by 30-60 minutes daily as tolerated Consider checking BG at alternate times per day as directed by MD  Consider taking medication  as directed by MD Lose 1 lb per week Get A1C to 7%   Teaching Method Utilized:  Visual Auditory Hands on  Handouts given during visit include:  The Plate Method   Meal Plan Card   Barriers to learning/adherence to lifestyle change:  None  Demonstrated degree of understanding via:  Teach Back   Monitoring/Evaluation:  Dietary intake, exercise, meal  planning, sbg, and body weight in 1 month(s).

## 2016-03-18 NOTE — Patient Instructions (Signed)
Goal Plan:  Aim for 2-3 Carb Choices per meal (30-45 grams) +/- 1 either way  Include protein in moderation with your meals and snacks Consider reading food labels for Total Carbohydrate and Fat Grams of foods Consider  increasing your activity level by 30-60 minutes daily as tolerated Consider checking BG at alternate times per day as directed by MD  Consider taking medication  as directed by MD Lose 1 lb per week Get A1C to 7%

## 2016-03-21 ENCOUNTER — Encounter: Payer: Medicare Other | Admitting: Nutrition

## 2016-03-21 ENCOUNTER — Ambulatory Visit (INDEPENDENT_AMBULATORY_CARE_PROVIDER_SITE_OTHER): Payer: Medicare Other | Admitting: "Endocrinology

## 2016-03-21 ENCOUNTER — Encounter: Payer: Self-pay | Admitting: "Endocrinology

## 2016-03-21 VITALS — Ht 62.0 in | Wt 192.0 lb

## 2016-03-21 VITALS — BP 110/68 | HR 108 | Ht 62.5 in | Wt 192.0 lb

## 2016-03-21 DIAGNOSIS — E1165 Type 2 diabetes mellitus with hyperglycemia: Secondary | ICD-10-CM

## 2016-03-21 DIAGNOSIS — I10 Essential (primary) hypertension: Secondary | ICD-10-CM

## 2016-03-21 DIAGNOSIS — Z794 Long term (current) use of insulin: Secondary | ICD-10-CM

## 2016-03-21 DIAGNOSIS — E118 Type 2 diabetes mellitus with unspecified complications: Secondary | ICD-10-CM

## 2016-03-21 DIAGNOSIS — E785 Hyperlipidemia, unspecified: Secondary | ICD-10-CM | POA: Diagnosis not present

## 2016-03-21 DIAGNOSIS — Z713 Dietary counseling and surveillance: Secondary | ICD-10-CM | POA: Diagnosis not present

## 2016-03-21 DIAGNOSIS — IMO0002 Reserved for concepts with insufficient information to code with codable children: Secondary | ICD-10-CM

## 2016-03-21 DIAGNOSIS — E119 Type 2 diabetes mellitus without complications: Secondary | ICD-10-CM | POA: Diagnosis not present

## 2016-03-21 MED ORDER — INSULIN ASPART 100 UNIT/ML FLEXPEN: 10.0000 [IU] | PEN_INJECTOR | Freq: Three times a day (TID) | SUBCUTANEOUS | 3 refills | Status: DC

## 2016-03-21 NOTE — Progress Notes (Signed)
Diabetes Self-Management Education  Visit Type:    Appt. Start Time:1445  Appt. End Time:  1500  03/21/2016    Gained 1 lb since last visit. Brought BS log . BS are still elevated. Says she is taking her medications of insulin as prescribed.   Levemir 30 units TID and 5 units of Novolog with meals. To see Dr. Dorris Fetch today. Meds increased to 40 units of Levemir and 10 units of Novolog with meals. Forgot to document how many units of meal time insulin she is taking on log sheets.   Trying to eat better.  Lab Results  Component Value Date   HGBA1C 10.3 01/12/2016     Carrie Turner, identified by name and date of birth, is a 65 y.o. female with a diagnosis of Diabetes:  .   ASSESSMENT  Wt Readings from Last 3 Encounters:  03/21/16 192 lb (87.1 kg)  03/21/16 192 lb (87.1 kg)  03/09/16 194 lb (88 kg)   Ht Readings from Last 3 Encounters:  03/21/16 5\' 2"  (1.575 m)  03/21/16 5' 2.5" (1.588 m)  03/09/16 5\' 2"  (1.575 m)   Body mass index is 35.12 kg/m.       Diabetes Self-Management Education - 03/21/16 1700      Dietary Intake   Lunch oatmeal and cottage cheese   Beverage(s) water     Patient Self-Evaluation of Goals - Patient rates self as meeting previously set goals (% of time)   Nutrition 25 - 50%   Physical Activity < 25%   Medications >75%   Monitoring >75%   Problem Solving 25 - 50%   Reducing Risk 25 - 50%   Health Coping 25 - 50%      Learning Objective:  Patient will have a greater understanding of diabetes self-management. Patient education plan is to attend individual and/or group sessions per assessed needs and concerns.   Plan:   Patient Instructions  Goals 1. Follow Plate Method 2. Talk to PCP about medication for reflux 3. Eat three balanced meals and increase fresh fruits and vegetables and whole grains. 4. Check blood sugar if symptoms of low blood sugar and treat. 5. Walk 15 minutes three days a week. 6. Record how much insulin you take  at meals  on sheet of paper given. 7. Lose 1-2 lbs per week, 8. Get A1C down to 8%.    Expected Outcomes:  Demonstrated interest in learning. Expect positive outcomes  Education material provided: Meal plan card and My Plate  If problems or questions, patient to contact team via:  Phone and Email  Future DSME appointment: - 4-6 wks

## 2016-03-21 NOTE — Progress Notes (Signed)
Subjective:    Patient ID: Carrie Turner, female    DOB: January 28, 1951. Patient is being seen in f/u for management of diabetes requested by  [facility], MD  Past Medical History:  Diagnosis Date  . Anxiety   . Depression   . Diabetes mellitus, type II ([place])   . GERD (gastroesophageal reflux disease)   . Hyperlipidemia   . Hypertension   . Vitamin D deficiency    Past Surgical History:  Procedure Laterality Date  . ABDOMINAL HYSTERECTOMY     Social History   Social History  . Marital status: Single    Spouse name: N/A  . Number of children: N/A  . Years of education: N/A   Social History Main Topics  . Smoking status: Never Smoker  . Smokeless tobacco: Never Used  . Alcohol use None  . Drug use: Unknown  . Sexual activity: Not Asked   Other Topics Concern  . None   Social History Narrative  . None   Outpatient Encounter Prescriptions as of 03/21/2016  Medication Sig  . amLODipine (NORVASC) 2.5 MG tablet Take 2.5 mg by mouth daily.  . Aspirin-Acetaminophen-Caffeine (EXCEDRIN MIGRAINE PO) Take by mouth.  . canagliflozin (INVOKANA) 100 MG TABS tablet Take 1 tablet (100 mg total) by mouth daily before breakfast.  . citalopram (CELEXA) 10 MG tablet Take 10 mg by mouth daily.  Marland Kitchen gabapentin (NEURONTIN) 100 MG capsule Take 300 mg by mouth at bedtime.  . insulin aspart (NOVOLOG FLEXPEN) 100 UNIT/ML FlexPen Inject 10-16 Units into the skin 3 (three) times daily with meals.  . Insulin Detemir (LEVEMIR FLEXTOUCH) 100 UNIT/ML Pen Inject 40 Units into the skin at bedtime.  Marland Kitchen omeprazole (PRILOSEC) 20 MG capsule Take 20 mg by mouth 2 (two) times daily before a meal.  . [DISCONTINUED] insulin aspart (NOVOLOG FLEXPEN) 100 UNIT/ML FlexPen Inject 5-11 Units into the skin 3 (three) times daily with meals.   No facility-administered encounter medications on file as of 03/21/2016.    ALLERGIES: Allergies  Allergen Reactions  . Sulfa Antibiotics    VACCINATION STATUS:  There is  no immunization history on file for this patient.  Diabetes  She presents for her follow-up diabetic visit. She has type 2 diabetes mellitus. Onset time: She was diagnosed at approximate age of 86 years. Her disease course has been worsening. There are no hypoglycemic associated symptoms. Pertinent negatives for hypoglycemia include no confusion, headaches, pallor or seizures. Associated symptoms include blurred vision, fatigue, polydipsia and polyuria. Pertinent negatives for diabetes include no chest pain and no polyphagia. There are no hypoglycemic complications. Symptoms are worsening. There are no diabetic complications. Risk factors for coronary artery disease include diabetes mellitus, dyslipidemia, obesity and sedentary lifestyle. Current diabetic treatment includes insulin injections (She is on Levemir 20 units, NovoLog 5 units with meals, and vocal 300 mg daily, metformin 1000 g by mouth twice a day.). Her weight is increasing steadily. She is following a generally unhealthy diet. When asked about meal planning, she reported none. She has not had a previous visit with a dietitian (She will see the dietitian today.). She never participates in exercise. Her home blood glucose trend is increasing steadily. Her breakfast blood glucose range is generally >200 mg/dl. Her lunch blood glucose range is generally >200 mg/dl. Her dinner blood glucose range is generally >200 mg/dl. Her overall blood glucose range is >200 mg/dl. An ACE inhibitor/angiotensin II receptor blocker is not being taken. Eye exam is current.  Hyperlipidemia  This is  a chronic problem. The current episode started more than 1 year ago. Recent lipid tests were reviewed and are high. Exacerbating diseases include diabetes and obesity. Pertinent negatives include no chest pain, myalgias or shortness of breath. She is currently on no antihyperlipidemic treatment. Compliance problems include adherence to diet and adherence to exercise.  Risk  factors for coronary artery disease include diabetes mellitus, dyslipidemia, hypertension, obesity and a sedentary lifestyle.  Hypertension  This is a chronic problem. The current episode started more than 1 year ago. The problem is controlled. Associated symptoms include blurred vision. Pertinent negatives include no chest pain, headaches, palpitations or shortness of breath. Risk factors for coronary artery disease include diabetes mellitus, dyslipidemia and sedentary lifestyle. Past treatments include calcium channel blockers. Compliance problems include diet and exercise.        Review of Systems  Constitutional: Positive for fatigue. Negative for unexpected weight change.  HENT: Negative for trouble swallowing and voice change.   Eyes: Positive for blurred vision. Negative for visual disturbance.  Respiratory: Negative for cough, shortness of breath and wheezing.   Cardiovascular: Negative for chest pain, palpitations and leg swelling.  Gastrointestinal: Negative for diarrhea, nausea and vomiting.  Endocrine: Positive for polydipsia and polyuria. Negative for cold intolerance, heat intolerance and polyphagia.  Genitourinary: Positive for frequency. Negative for dysuria and flank pain.  Musculoskeletal: Negative for arthralgias and myalgias.  Skin: Negative for color change, pallor, rash and wound.  Neurological: Negative for seizures and headaches.  Psychiatric/Behavioral: Negative for confusion and suicidal ideas.    Objective:    BP 110/68   Pulse (!) 108   Ht 5' 2.5" (1.588 m)   Wt 192 lb (87.1 kg)   BMI 34.56 kg/m   Wt Readings from Last 3 Encounters:  03/21/16 192 lb (87.1 kg)  03/21/16 192 lb (87.1 kg)  03/09/16 194 lb (88 kg)    Physical Exam  Constitutional: She is oriented to person, place, and time. She appears well-developed.  HENT:  Head: Normocephalic and atraumatic.  Eyes: EOM are normal.  Neck: Normal range of motion. Neck supple. No tracheal deviation  present. No thyromegaly present.  Cardiovascular: Normal rate and regular rhythm.   Pulmonary/Chest: Effort normal and breath sounds normal.  Abdominal: Soft. Bowel sounds are normal. There is no tenderness. There is no guarding.  Musculoskeletal: Normal range of motion. She exhibits no edema.  Neurological: She is alert and oriented to person, place, and time. She has normal reflexes. No cranial nerve deficit. Coordination normal.  Skin: Skin is warm and dry. No rash noted. No erythema. No pallor.  Psychiatric: She has a normal mood and affect. Judgment normal.    A1c was 10.3% on 01/12/2016, vitamin D low at 20.8 on 09/25/2015, liver function test was abnormal with alkaline phosphatase 147 (normal 39-1 17), AST 62, ALT 66, A1c 9.9% on 09/23/2015   Assessment & Plan:   1. Uncontrolled type 2 diabetes mellitus with complication, with long-term current use of insulin ([place])  - Patient has currently uncontrolled symptomatic type 2 DM since  65 years of age,  with most recent A1c of 10.3 %. Recent labs reviewed.   Her diabetes is complicated by obesity and sedentary life and patient remains at a high risk for more acute and chronic complications of diabetes which include CAD, CVA, CKD, retinopathy, and neuropathy. These are all discussed in detail with the patient.  - I have counseled the patient on diet management and weight loss, by adopting a carbohydrate restricted/protein  rich diet.  - Suggestion is made for patient to avoid simple carbohydrates   from their diet including Cakes , Desserts, Ice Cream,  Soda (  diet and regular) , Sweet Tea , Candies,  Chips, Cookies, Artificial Sweeteners,   and "Sugar-free" Products . This will help patient to have stable blood glucose profile and potentially avoid unintended weight gain.  - I encouraged the patient to switch to  unprocessed or minimally processed complex starch and increased protein intake (animal or plant source), fruits, and  vegetables.  - Patient is advised to stick to a routine mealtimes to eat 3 meals  a day and avoid unnecessary snacks ( to snack only to correct hypoglycemia).  - The patient will be scheduled with Jearld Fenton, RDN, CDE for individualized DM education.  - I have approached patient with the following individualized plan to manage diabetes and patient agrees:   -She will continue to require basal/bolus insulin. - I  will proceed to readjust her basal insulin Levemir to 40 units QHS, and prandial insulin NovoLog to 10 units TIDAC for pre-meal BG readings of 90-150mg /dl, plus patient specific correction dose for unexpected hyperglycemia above 150mg /dl, associated with strict monitoring of glucose  AC and HS. - Patient is warned not to take insulin without proper monitoring per orders. -Adjustment parameters are given for hypo and hyperglycemia in writing. -Patient is encouraged to call clinic for blood glucose levels less than 70 or above 300 mg /dl. - I will lower and continue Invokana to 100 milligrams by mouth every morning with breakfast, therapeutically suitable for patient- side effects and precautions discussed with her. - Given her abnormal liver function test, I will discontinue her metformin for now. - Patient will be considered for incretin therapy as appropriate next visit. - Patient specific target  A1c;  LDL, HDL, Triglycerides, and  Waist Circumference were discussed in detail.  2) BP/HTN: Controlled. She will be considered for ACE inhibitor/ARB next visit.  3) Lipids/HPL:  Control unknown, I would obtain fasting lipid panel on subsequent visits. She is not on statins likely due to a concern of elevated transaminases. -However she will be reassessed for utility of statins after her next labs. 4)  obesity: CDE Consult in progress , exercise, and detailed carbohydrates information provided.  5) Chronic Care/Health Maintenance:  -Patient is encouraged to continue to follow up with  Ophthalmology, Podiatrist at least yearly or according to recommendations, and advised to   stay away from smoking. I have recommended yearly flu vaccine and pneumonia vaccination at least every 5 years; moderate intensity exercise for up to 150 minutes weekly; and  sleep for at least 7 hours a day.  - 25 minutes of time was spent on the care of this patient , 50% of which was applied for counseling on diabetes complications and their preventions.  - Patient to bring meter and  blood glucose logs during their next visit.   - I advised patient to maintain close follow up with [facility], MD for primary care needs.  Follow up plan: - Return in about 4 weeks (around 04/18/2016) for follow up with pre-visit labs, meter, and logs.  Glade Lloyd, MD Phone: [phone]  Fax: [phone]   03/21/2016, 4:23 PM

## 2016-03-21 NOTE — Patient Instructions (Signed)
Goals 1. Follow Plate Method 2. Talk to PCP about medication for reflux 3. Eat three balanced meals and increase fresh fruits and vegetables and whole grains. 4. Check blood sugar if symptoms of low blood sugar and treat. 5. Walk 15 minutes three days a week. 6. Record how much insulin you take at meals  on sheet of paper given. 7. Lose 1-2 lbs per week, 8. Get A1C down to 8%.

## 2016-03-21 NOTE — Patient Instructions (Signed)

## 2016-04-20 DIAGNOSIS — E118 Type 2 diabetes mellitus with unspecified complications: Secondary | ICD-10-CM | POA: Diagnosis not present

## 2016-04-20 DIAGNOSIS — Z794 Long term (current) use of insulin: Secondary | ICD-10-CM | POA: Diagnosis not present

## 2016-04-20 DIAGNOSIS — E1165 Type 2 diabetes mellitus with hyperglycemia: Secondary | ICD-10-CM | POA: Diagnosis not present

## 2016-04-20 LAB — HEMOGLOBIN A1C: Hemoglobin A1C: 11.4

## 2016-04-21 ENCOUNTER — Ambulatory Visit: Payer: Medicare Other | Admitting: "Endocrinology

## 2016-04-21 ENCOUNTER — Telehealth: Payer: Self-pay | Admitting: Nutrition

## 2016-04-21 ENCOUNTER — Encounter: Payer: Medicare Other | Attending: "Endocrinology | Admitting: Nutrition

## 2016-04-21 DIAGNOSIS — E119 Type 2 diabetes mellitus without complications: Secondary | ICD-10-CM | POA: Insufficient documentation

## 2016-04-21 DIAGNOSIS — Z713 Dietary counseling and surveillance: Secondary | ICD-10-CM | POA: Insufficient documentation

## 2016-04-21 DIAGNOSIS — G43919 Migraine, unspecified, intractable, without status migrainosus: Secondary | ICD-10-CM | POA: Diagnosis not present

## 2016-04-21 NOTE — Telephone Encounter (Signed)
VM left to call and reschedule missed appt. 

## 2016-04-25 ENCOUNTER — Encounter: Payer: Medicare Other | Attending: "Endocrinology | Admitting: Nutrition

## 2016-04-25 ENCOUNTER — Encounter: Payer: Self-pay | Admitting: Nutrition

## 2016-04-25 ENCOUNTER — Encounter: Payer: Self-pay | Admitting: "Endocrinology

## 2016-04-25 ENCOUNTER — Ambulatory Visit (INDEPENDENT_AMBULATORY_CARE_PROVIDER_SITE_OTHER): Payer: Medicare Other | Admitting: "Endocrinology

## 2016-04-25 VITALS — BP 143/95 | HR 109 | Ht 62.5 in | Wt 192.0 lb

## 2016-04-25 DIAGNOSIS — Z794 Long term (current) use of insulin: Secondary | ICD-10-CM

## 2016-04-25 DIAGNOSIS — Z713 Dietary counseling and surveillance: Secondary | ICD-10-CM | POA: Insufficient documentation

## 2016-04-25 DIAGNOSIS — E118 Type 2 diabetes mellitus with unspecified complications: Secondary | ICD-10-CM

## 2016-04-25 DIAGNOSIS — Z6834 Body mass index (BMI) 34.0-34.9, adult: Secondary | ICD-10-CM | POA: Diagnosis not present

## 2016-04-25 DIAGNOSIS — I10 Essential (primary) hypertension: Secondary | ICD-10-CM

## 2016-04-25 DIAGNOSIS — E1165 Type 2 diabetes mellitus with hyperglycemia: Secondary | ICD-10-CM

## 2016-04-25 DIAGNOSIS — E782 Mixed hyperlipidemia: Secondary | ICD-10-CM | POA: Diagnosis not present

## 2016-04-25 DIAGNOSIS — IMO0002 Reserved for concepts with insufficient information to code with codable children: Secondary | ICD-10-CM

## 2016-04-25 DIAGNOSIS — E6609 Other obesity due to excess calories: Secondary | ICD-10-CM | POA: Diagnosis not present

## 2016-04-25 DIAGNOSIS — E119 Type 2 diabetes mellitus without complications: Secondary | ICD-10-CM | POA: Insufficient documentation

## 2016-04-25 NOTE — Patient Instructions (Signed)

## 2016-04-25 NOTE — Patient Instructions (Signed)
Goals 1. Exercise 30 minutes 4 days per week 2. Avoid grapefruit products 3. Drink 4 bottles of water per day 4. Cut out Diet Pepsi Test blood sugars 4 times per day Use sliding scale insulin with meals. Inject 5 minutes before meal. Get A1C down to 7%

## 2016-04-25 NOTE — Progress Notes (Signed)
Diabetes Self-Management Education  Visit Type:  Follow-up  Appt. Start Time: 1500 Appt. End Time: [id number]  04/25/2016  Carrie Turner Books, identified by name and date of birth, is a 65 y.o. female with a diagnosis of Diabetes:  .   ASSESSMENT  Wt Readings from Last 3 Encounters:  04/25/16 192 lb (87.1 kg)  03/21/16 192 lb (87.1 kg)  03/21/16 192 lb (87.1 kg)   Ht Readings from Last 3 Encounters:  04/25/16 5' 2.5" (1.588 m)  03/21/16 5\' 2"  (1.575 m)  03/21/16 5' 2.5" (1.588 m)   There is no height or weight on file to calculate BMI. @BMIFA @ Facility age limit for growth percentiles is 20 years. Facility age limit for growth percentiles is 20 years.       Diabetes Self-Management Education - 04/25/16 1800      Health Coping   How would you rate your overall health? Fair     Complications   Last HgB A1C per patient/outside source --  11.4   How often do you check your blood sugar? 1-2 times/day   Fasting Blood glucose range (mg/dL) 180-200   Postprandial Blood glucose range (mg/dL) 180-200   Number of hypoglycemic episodes per month 0   Number of hyperglycemic episodes per week 15   Can you tell when your blood sugar is high? Yes   What do you do if your blood sugar is high? nothing   Are you checking your feet? Yes   How many days per week are you checking your feet? 7     Dietary Intake   Breakfast --  egg, 1 slice toast, juice   Lunch skips   Snack (afternoon) misc   Dinner Grilled cheese sandwich and soup, water   Snack (evening) misc   Beverage(s) water, diet sodas     Patient Education   Nutrition management  Food label reading, portion sizes and measuring food.;Carbohydrate counting;Reviewed blood glucose goals for pre and post meals and how to evaluate the patients' food intake on their blood glucose level.;Meal timing in regards to the patients' current diabetes medication.;Information on hints to eating out and maintain blood glucose control.   Physical  activity and exercise  Role of exercise on diabetes management, blood pressure control and cardiac health.;Identified with patient nutritional and/or medication changes necessary with exercise.   Medications Taught/reviewed insulin injection, site rotation, insulin storage and needle disposal.;Reviewed patients medication for diabetes, action, purpose, timing of dose and side effects.   Monitoring Taught/evaluated SMBG meter.;Purpose and frequency of SMBG.;Taught/discussed recording of test results and interpretation of SMBG.;Interpreting lab values - A1C, lipid, urine microalbumina.;Identified appropriate SMBG and/or A1C goals.   Acute complications Taught treatment of hypoglycemia - the 15 rule.;Discussed and identified patients' treatment of hyperglycemia.   Chronic complications Assessed and discussed foot care and prevention of foot problems;Lipid levels, blood glucose control and heart disease;Identified and discussed with patient  current chronic complications   Psychosocial adjustment Role of stress on diabetes;Worked with patient to identify barriers to care and solutions;Identified and addressed patients feelings and concerns about diabetes   Personal strategies to promote health Lifestyle issues that need to be addressed for better diabetes care     Individualized Goals (developed by patient)   Nutrition Follow meal plan discussed;General guidelines for healthy choices and portions discussed   Physical Activity Exercise 3-5 times per week;30 minutes per day   Medications take my medication as prescribed   Monitoring  test my blood glucose as discussed;send in my  blood glucose log as discussed;test blood glucose pre and post meals as discussed     Patient Self-Evaluation of Goals - Patient rates self as meeting previously set goals (% of time)   Nutrition 25 - 50%   Physical Activity 25 - 50%   Medications 25 - 50%   Monitoring 25 - 50%   Problem Solving 25 - 50%   Reducing Risk 25 -  50%   Health Coping 25 - 50%     Post-Education Assessment   Patient understands the diabetes disease and treatment process. Needs Review   Patient understands incorporating nutritional management into lifestyle. Needs Review   Patient undertands incorporating physical activity into lifestyle. Needs Review   Patient understands using medications safely. Needs Review   Patient understands monitoring blood glucose, interpreting and using results Needs Review   Patient understands prevention, detection, and treatment of acute complications. Needs Review   Patient understands prevention, detection, and treatment of chronic complications. Needs Review   Patient understands how to develop strategies to address psychosocial issues. Needs Review   Patient understands how to develop strategies to promote health/change behavior. Needs Review     Outcomes   Program Status Completed     Subsequent Visit   Since your last visit have you continued or begun to take your medications as prescribed? No  Hfasn't been taking meal time insulin as prescribed. Forgets      Learning Objective:  Patient will have a greater understanding of diabetes self-management. Patient education plan is to attend individual and/or group sessions per assessed needs and concerns.   Plan: Goals 1. Exercise 30 minutes 4 days per week 2. Avoid grapefruit products 3. Drink 4 bottles of water per day 4. Cut out Diet Pepsi Test blood sugars 4 times per day Use sliding scale insulin with meals. Inject 5 minutes before meal. Get A1C down to 7%  Expected Outcomes:  Demonstrated interest in learning. Expect positive outcomes  Education material provided: Meal plan card and My Plate  If problems or questions, patient to contact team via:  Phone and Email  Future DSME appointment: - 3-4 months  She needs better compliance with medications for improved blood sugars.

## 2016-04-25 NOTE — Progress Notes (Signed)
Subjective:    Patient ID: Carrie Turner, female    DOB: March 11, 1951. Patient is being seen in f/u for management of diabetes requested by  [facility], MD  Past Medical History:  Diagnosis Date  . Anxiety   . Depression   . Diabetes mellitus, type II ([place])   . GERD (gastroesophageal reflux disease)   . Hyperlipidemia   . Hypertension   . Vitamin D deficiency    Past Surgical History:  Procedure Laterality Date  . ABDOMINAL HYSTERECTOMY     Social History   Social History  . Marital status: Single    Spouse name: N/A  . Number of children: N/A  . Years of education: N/A   Social History Main Topics  . Smoking status: Never Smoker  . Smokeless tobacco: Never Used  . Alcohol use None  . Drug use: Unknown  . Sexual activity: Not Asked   Other Topics Concern  . None   Social History Narrative  . None   Outpatient Encounter Prescriptions as of 04/25/2016  Medication Sig  . amLODipine (NORVASC) 2.5 MG tablet Take 2.5 mg by mouth daily.  . Aspirin-Acetaminophen-Caffeine (EXCEDRIN MIGRAINE PO) Take by mouth.  . canagliflozin (INVOKANA) 100 MG TABS tablet Take 1 tablet (100 mg total) by mouth daily before breakfast.  . citalopram (CELEXA) 10 MG tablet Take 10 mg by mouth daily.  Marland Kitchen gabapentin (NEURONTIN) 100 MG capsule Take 300 mg by mouth at bedtime.  . insulin aspart (NOVOLOG FLEXPEN) 100 UNIT/ML FlexPen Inject 10-16 Units into the skin 3 (three) times daily with meals.  . Insulin Detemir (LEVEMIR FLEXTOUCH) 100 UNIT/ML Pen Inject 40 Units into the skin at bedtime.  Marland Kitchen omeprazole (PRILOSEC) 20 MG capsule Take 20 mg by mouth 2 (two) times daily before a meal.   No facility-administered encounter medications on file as of 04/25/2016.    ALLERGIES: Allergies  Allergen Reactions  . Sulfa Antibiotics    VACCINATION STATUS:  There is no immunization history on file for this patient.  Diabetes  She presents for her follow-up diabetic visit. She has type 2 diabetes  mellitus. Onset time: She was diagnosed at approximate age of 41 years. Her disease course has been worsening. There are no hypoglycemic associated symptoms. Pertinent negatives for hypoglycemia include no confusion, headaches, pallor or seizures. Associated symptoms include blurred vision, fatigue, polydipsia and polyuria. Pertinent negatives for diabetes include no chest pain and no polyphagia. There are no hypoglycemic complications. Symptoms are worsening. There are no diabetic complications. Risk factors for coronary artery disease include diabetes mellitus, dyslipidemia, obesity and sedentary lifestyle. Current diabetic treatment includes insulin injections (She did not freeze her insulin dose as recommended during her last visit.). She is compliant with treatment some of the time. Her weight is increasing steadily. She is following a generally unhealthy diet. When asked about meal planning, she reported none. She has not had a previous visit with a dietitian (She will see the dietitian today.). She never participates in exercise. Her home blood glucose trend is increasing steadily. Her breakfast blood glucose range is generally >200 mg/dl. Her lunch blood glucose range is generally >200 mg/dl. Her dinner blood glucose range is generally >200 mg/dl. Her overall blood glucose range is >200 mg/dl. An ACE inhibitor/angiotensin II receptor blocker is not being taken. Eye exam is current.  Hyperlipidemia  This is a chronic problem. The current episode started more than 1 year ago. Recent lipid tests were reviewed and are high. Exacerbating diseases include diabetes  and obesity. Pertinent negatives include no chest pain, myalgias or shortness of breath. She is currently on no antihyperlipidemic treatment. Compliance problems include adherence to diet and adherence to exercise.  Risk factors for coronary artery disease include diabetes mellitus, dyslipidemia, hypertension, obesity and a sedentary lifestyle.   Hypertension  This is a chronic problem. The current episode started more than 1 year ago. The problem is controlled. Associated symptoms include blurred vision. Pertinent negatives include no chest pain, headaches, palpitations or shortness of breath. Risk factors for coronary artery disease include diabetes mellitus, dyslipidemia and sedentary lifestyle. Past treatments include calcium channel blockers. Compliance problems include diet and exercise.        Review of Systems  Constitutional: Positive for fatigue. Negative for unexpected weight change.  HENT: Negative for trouble swallowing and voice change.   Eyes: Positive for blurred vision. Negative for visual disturbance.  Respiratory: Negative for cough, shortness of breath and wheezing.   Cardiovascular: Negative for chest pain, palpitations and leg swelling.  Gastrointestinal: Negative for diarrhea, nausea and vomiting.  Endocrine: Positive for polydipsia and polyuria. Negative for cold intolerance, heat intolerance and polyphagia.  Genitourinary: Positive for frequency. Negative for dysuria and flank pain.  Musculoskeletal: Negative for arthralgias and myalgias.  Skin: Negative for color change, pallor, rash and wound.  Neurological: Negative for seizures and headaches.  Psychiatric/Behavioral: Negative for confusion and suicidal ideas.    Objective:    BP (!) 143/95   Pulse (!) 109   Ht 5' 2.5" (1.588 m)   Wt 192 lb (87.1 kg)   BMI 34.56 kg/m   Wt Readings from Last 3 Encounters:  04/25/16 192 lb (87.1 kg)  03/21/16 192 lb (87.1 kg)  03/21/16 192 lb (87.1 kg)    Physical Exam  Constitutional: She is oriented to person, place, and time. She appears well-developed.  HENT:  Head: Normocephalic and atraumatic.  Eyes: EOM are normal.  Neck: Normal range of motion. Neck supple. No tracheal deviation present. No thyromegaly present.  Cardiovascular: Normal rate and regular rhythm.   Pulmonary/Chest: Effort normal and  breath sounds normal.  Abdominal: Soft. Bowel sounds are normal. There is no tenderness. There is no guarding.  Musculoskeletal: Normal range of motion. She exhibits no edema.  Neurological: She is alert and oriented to person, place, and time. She has normal reflexes. No cranial nerve deficit. Coordination normal.  Skin: Skin is warm and dry. No rash noted. No erythema. No pallor.  Psychiatric: She has a normal mood and affect. Judgment normal.   04/20/2016: Her A1c was 11.4% A1c was 10.3% on 01/12/2016,  vitamin D low at 20.8 on 09/25/2015, liver function test was abnormal with alkaline phosphatase 147 (normal 39-1 17), AST 62, ALT 66,    Assessment & Plan:   1. Uncontrolled type 2 diabetes mellitus with complication, with long-term current use of insulin ([place])  - Patient has currently uncontrolled symptomatic type 2 DM since  65 years of age,  with most recent A1c of 11.4% increasing from 10.3 %. Recent labs reviewed.   Her diabetes is complicated by obesity and sedentary life and patient remains at a high risk for more acute and chronic complications of diabetes which include CAD, CVA, CKD, retinopathy, and neuropathy. These are all discussed in detail with the patient.  - I have counseled the patient on diet management and weight loss, by adopting a carbohydrate restricted/protein rich diet.  - Suggestion is made for patient to avoid simple carbohydrates   from their diet  including Cakes , Desserts, Ice Cream,  Soda (  diet and regular) , Sweet Tea , Candies,  Chips, Cookies, Artificial Sweeteners,   and "Sugar-free" Products . This will help patient to have stable blood glucose profile and potentially avoid unintended weight gain.  - I encouraged the patient to switch to  unprocessed or minimally processed complex starch and increased protein intake (animal or plant source), fruits, and vegetables.  - Patient is advised to stick to a routine mealtimes to eat 3 meals  a day and avoid  unnecessary snacks ( to snack only to correct hypoglycemia).  - The patient will be scheduled with Jearld Fenton, RDN, CDE for individualized DM education.  - I have approached patient with the following individualized plan to manage diabetes and patient agrees:   -She will continue to require basal/bolus insulin. - She failed to adjust her insulin dose per recommendation last visit, I urged her to resume and continue  basal insulin Levemir 40 units QHS, and prandial insulin NovoLog  10 units TIDAC for pre-meal BG readings of 90-150mg /dl, plus patient specific correction dose for unexpected hyperglycemia above 150mg /dl, associated with strict monitoring of glucose  AC and HS. - Patient is warned not to take insulin without proper monitoring per orders. -Adjustment parameters are given for hypo and hyperglycemia in writing. -Patient is encouraged to call clinic for blood glucose levels less than 70 or above 300 mg /dl. - I will lower and continue Invokana to 100 mg by mouth every morning with breakfast, therapeutically suitable for patient- side effects and precautions discussed with her. - Given her abnormal liver function test, I will discontinue her metformin for now. - Patient will be considered for incretin therapy as appropriate next visit. - Patient specific target  A1c;  LDL, HDL, Triglycerides, and  Waist Circumference were discussed in detail.  2) BP/HTN: Controlled. She will be considered for ACE inhibitor/ARB next visit.  3) Lipids/HPL:  Control unknown, I would obtain fasting lipid panel on subsequent visits. She is not on statins likely due to a concern of elevated transaminases. -However she will be reassessed for utility of statins after her next labs. 4)  obesity: CDE Consult in progress , exercise, and detailed carbohydrates information provided.  5) Chronic Care/Health Maintenance:  -Patient is encouraged to continue to follow up with Ophthalmology, Podiatrist at least yearly  or according to recommendations, and advised to   stay away from smoking. I have recommended yearly flu vaccine and pneumonia vaccination at least every 5 years; moderate intensity exercise for up to 150 minutes weekly; and  sleep for at least 7 hours a day.  - 25 minutes of time was spent on the care of this patient , 50% of which was applied for counseling on diabetes complications and their preventions.  - Patient to bring meter and  blood glucose logs during their next visit.   - I advised patient to maintain close follow up with [facility], MD for primary care needs.  Follow up plan: - Return in about 3 months (around 07/26/2016) for meter, and logs.  Glade Lloyd, MD Phone: [phone]  Fax: [phone]   04/25/2016, 4:07 PM

## 2016-04-26 DIAGNOSIS — F329 Major depressive disorder, single episode, unspecified: Secondary | ICD-10-CM | POA: Diagnosis not present

## 2016-04-26 DIAGNOSIS — D473 Essential (hemorrhagic) thrombocythemia: Secondary | ICD-10-CM | POA: Diagnosis not present

## 2016-04-26 DIAGNOSIS — E7800 Pure hypercholesterolemia, unspecified: Secondary | ICD-10-CM | POA: Diagnosis not present

## 2016-04-26 DIAGNOSIS — Z299 Encounter for prophylactic measures, unspecified: Secondary | ICD-10-CM | POA: Diagnosis not present

## 2016-04-26 DIAGNOSIS — E1165 Type 2 diabetes mellitus with hyperglycemia: Secondary | ICD-10-CM | POA: Diagnosis not present

## 2016-04-26 DIAGNOSIS — Z6834 Body mass index (BMI) 34.0-34.9, adult: Secondary | ICD-10-CM | POA: Diagnosis not present

## 2016-04-26 DIAGNOSIS — E1142 Type 2 diabetes mellitus with diabetic polyneuropathy: Secondary | ICD-10-CM | POA: Diagnosis not present

## 2016-05-16 DIAGNOSIS — F209 Schizophrenia, unspecified: Secondary | ICD-10-CM | POA: Diagnosis not present

## 2016-05-17 DIAGNOSIS — E119 Type 2 diabetes mellitus without complications: Secondary | ICD-10-CM | POA: Diagnosis not present

## 2016-05-21 DIAGNOSIS — K219 Gastro-esophageal reflux disease without esophagitis: Secondary | ICD-10-CM | POA: Diagnosis not present

## 2016-05-21 DIAGNOSIS — X58XXXA Exposure to other specified factors, initial encounter: Secondary | ICD-10-CM | POA: Diagnosis not present

## 2016-05-21 DIAGNOSIS — K529 Noninfective gastroenteritis and colitis, unspecified: Secondary | ICD-10-CM | POA: Diagnosis not present

## 2016-05-21 DIAGNOSIS — S82851A Displaced trimalleolar fracture of right lower leg, initial encounter for closed fracture: Secondary | ICD-10-CM | POA: Diagnosis not present

## 2016-05-21 DIAGNOSIS — M25579 Pain in unspecified ankle and joints of unspecified foot: Secondary | ICD-10-CM | POA: Diagnosis not present

## 2016-05-21 DIAGNOSIS — I10 Essential (primary) hypertension: Secondary | ICD-10-CM | POA: Diagnosis not present

## 2016-05-21 DIAGNOSIS — T148XXA Other injury of unspecified body region, initial encounter: Secondary | ICD-10-CM | POA: Diagnosis not present

## 2016-05-21 DIAGNOSIS — W010XXA Fall on same level from slipping, tripping and stumbling without subsequent striking against object, initial encounter: Secondary | ICD-10-CM | POA: Diagnosis not present

## 2016-05-21 DIAGNOSIS — E119 Type 2 diabetes mellitus without complications: Secondary | ICD-10-CM | POA: Diagnosis not present

## 2016-05-21 DIAGNOSIS — S9301XA Subluxation of right ankle joint, initial encounter: Secondary | ICD-10-CM | POA: Diagnosis not present

## 2016-05-21 DIAGNOSIS — S82841A Displaced bimalleolar fracture of right lower leg, initial encounter for closed fracture: Secondary | ICD-10-CM | POA: Diagnosis not present

## 2016-05-23 DIAGNOSIS — R2689 Other abnormalities of gait and mobility: Secondary | ICD-10-CM | POA: Diagnosis not present

## 2016-05-23 DIAGNOSIS — M25571 Pain in right ankle and joints of right foot: Secondary | ICD-10-CM | POA: Diagnosis not present

## 2016-05-23 DIAGNOSIS — Z743 Need for continuous supervision: Secondary | ICD-10-CM | POA: Diagnosis not present

## 2016-05-23 DIAGNOSIS — E1140 Type 2 diabetes mellitus with diabetic neuropathy, unspecified: Secondary | ICD-10-CM | POA: Diagnosis not present

## 2016-05-23 DIAGNOSIS — S82841A Displaced bimalleolar fracture of right lower leg, initial encounter for closed fracture: Secondary | ICD-10-CM | POA: Diagnosis not present

## 2016-05-23 DIAGNOSIS — R279 Unspecified lack of coordination: Secondary | ICD-10-CM | POA: Diagnosis not present

## 2016-05-23 DIAGNOSIS — S82891A Other fracture of right lower leg, initial encounter for closed fracture: Secondary | ICD-10-CM | POA: Diagnosis not present

## 2016-05-23 DIAGNOSIS — Z794 Long term (current) use of insulin: Secondary | ICD-10-CM | POA: Diagnosis not present

## 2016-05-23 DIAGNOSIS — Z4789 Encounter for other orthopedic aftercare: Secondary | ICD-10-CM | POA: Diagnosis not present

## 2016-05-23 DIAGNOSIS — K219 Gastro-esophageal reflux disease without esophagitis: Secondary | ICD-10-CM | POA: Diagnosis not present

## 2016-05-23 DIAGNOSIS — J449 Chronic obstructive pulmonary disease, unspecified: Secondary | ICD-10-CM | POA: Diagnosis not present

## 2016-05-23 DIAGNOSIS — S9301XA Subluxation of right ankle joint, initial encounter: Secondary | ICD-10-CM | POA: Diagnosis not present

## 2016-05-23 DIAGNOSIS — K529 Noninfective gastroenteritis and colitis, unspecified: Secondary | ICD-10-CM | POA: Diagnosis not present

## 2016-05-23 DIAGNOSIS — I10 Essential (primary) hypertension: Secondary | ICD-10-CM | POA: Diagnosis not present

## 2016-05-23 DIAGNOSIS — E119 Type 2 diabetes mellitus without complications: Secondary | ICD-10-CM | POA: Diagnosis not present

## 2016-05-23 DIAGNOSIS — W19XXXA Unspecified fall, initial encounter: Secondary | ICD-10-CM | POA: Diagnosis not present

## 2016-05-23 DIAGNOSIS — Z0000 Encounter for general adult medical examination without abnormal findings: Secondary | ICD-10-CM | POA: Diagnosis not present

## 2016-05-23 DIAGNOSIS — R278 Other lack of coordination: Secondary | ICD-10-CM | POA: Diagnosis not present

## 2016-05-23 DIAGNOSIS — X58XXXA Exposure to other specified factors, initial encounter: Secondary | ICD-10-CM | POA: Diagnosis not present

## 2016-05-23 DIAGNOSIS — Z79899 Other long term (current) drug therapy: Secondary | ICD-10-CM | POA: Diagnosis not present

## 2016-05-23 DIAGNOSIS — F329 Major depressive disorder, single episode, unspecified: Secondary | ICD-10-CM | POA: Diagnosis not present

## 2016-05-23 DIAGNOSIS — Z882 Allergy status to sulfonamides status: Secondary | ICD-10-CM | POA: Diagnosis not present

## 2016-05-23 DIAGNOSIS — Y92009 Unspecified place in unspecified non-institutional (private) residence as the place of occurrence of the external cause: Secondary | ICD-10-CM | POA: Diagnosis not present

## 2016-05-23 DIAGNOSIS — M6281 Muscle weakness (generalized): Secondary | ICD-10-CM | POA: Diagnosis not present

## 2016-05-23 DIAGNOSIS — S82851A Displaced trimalleolar fracture of right lower leg, initial encounter for closed fracture: Secondary | ICD-10-CM | POA: Diagnosis not present

## 2016-05-24 DIAGNOSIS — S82841A Displaced bimalleolar fracture of right lower leg, initial encounter for closed fracture: Secondary | ICD-10-CM | POA: Diagnosis not present

## 2016-05-27 DIAGNOSIS — Z882 Allergy status to sulfonamides status: Secondary | ICD-10-CM | POA: Diagnosis not present

## 2016-05-27 DIAGNOSIS — R2689 Other abnormalities of gait and mobility: Secondary | ICD-10-CM | POA: Diagnosis not present

## 2016-05-27 DIAGNOSIS — E119 Type 2 diabetes mellitus without complications: Secondary | ICD-10-CM | POA: Diagnosis not present

## 2016-05-27 DIAGNOSIS — K529 Noninfective gastroenteritis and colitis, unspecified: Secondary | ICD-10-CM | POA: Diagnosis not present

## 2016-05-27 DIAGNOSIS — W19XXXA Unspecified fall, initial encounter: Secondary | ICD-10-CM | POA: Diagnosis not present

## 2016-05-27 DIAGNOSIS — Y92009 Unspecified place in unspecified non-institutional (private) residence as the place of occurrence of the external cause: Secondary | ICD-10-CM | POA: Diagnosis not present

## 2016-05-27 DIAGNOSIS — Z4789 Encounter for other orthopedic aftercare: Secondary | ICD-10-CM | POA: Diagnosis not present

## 2016-05-27 DIAGNOSIS — I10 Essential (primary) hypertension: Secondary | ICD-10-CM | POA: Diagnosis not present

## 2016-05-27 DIAGNOSIS — S8291XA Unspecified fracture of right lower leg, initial encounter for closed fracture: Secondary | ICD-10-CM | POA: Diagnosis not present

## 2016-05-27 DIAGNOSIS — S82841A Displaced bimalleolar fracture of right lower leg, initial encounter for closed fracture: Secondary | ICD-10-CM | POA: Diagnosis not present

## 2016-05-27 DIAGNOSIS — S9301XA Subluxation of right ankle joint, initial encounter: Secondary | ICD-10-CM | POA: Diagnosis not present

## 2016-05-27 DIAGNOSIS — R278 Other lack of coordination: Secondary | ICD-10-CM | POA: Diagnosis not present

## 2016-05-27 DIAGNOSIS — F329 Major depressive disorder, single episode, unspecified: Secondary | ICD-10-CM | POA: Diagnosis not present

## 2016-05-27 DIAGNOSIS — Z794 Long term (current) use of insulin: Secondary | ICD-10-CM | POA: Diagnosis not present

## 2016-05-27 DIAGNOSIS — F419 Anxiety disorder, unspecified: Secondary | ICD-10-CM | POA: Diagnosis not present

## 2016-05-27 DIAGNOSIS — M6281 Muscle weakness (generalized): Secondary | ICD-10-CM | POA: Diagnosis not present

## 2016-05-27 DIAGNOSIS — E1140 Type 2 diabetes mellitus with diabetic neuropathy, unspecified: Secondary | ICD-10-CM | POA: Diagnosis not present

## 2016-05-27 DIAGNOSIS — K219 Gastro-esophageal reflux disease without esophagitis: Secondary | ICD-10-CM | POA: Diagnosis not present

## 2016-05-28 DIAGNOSIS — I10 Essential (primary) hypertension: Secondary | ICD-10-CM | POA: Diagnosis not present

## 2016-05-28 DIAGNOSIS — S8291XA Unspecified fracture of right lower leg, initial encounter for closed fracture: Secondary | ICD-10-CM | POA: Diagnosis not present

## 2016-05-28 DIAGNOSIS — E119 Type 2 diabetes mellitus without complications: Secondary | ICD-10-CM | POA: Diagnosis not present

## 2016-06-02 DIAGNOSIS — F419 Anxiety disorder, unspecified: Secondary | ICD-10-CM | POA: Diagnosis not present

## 2016-06-02 DIAGNOSIS — E119 Type 2 diabetes mellitus without complications: Secondary | ICD-10-CM | POA: Diagnosis not present

## 2016-06-02 DIAGNOSIS — K219 Gastro-esophageal reflux disease without esophagitis: Secondary | ICD-10-CM | POA: Diagnosis not present

## 2016-06-02 DIAGNOSIS — F329 Major depressive disorder, single episode, unspecified: Secondary | ICD-10-CM | POA: Diagnosis not present

## 2016-06-10 DIAGNOSIS — Z794 Long term (current) use of insulin: Secondary | ICD-10-CM | POA: Diagnosis not present

## 2016-06-10 DIAGNOSIS — S82841A Displaced bimalleolar fracture of right lower leg, initial encounter for closed fracture: Secondary | ICD-10-CM | POA: Diagnosis not present

## 2016-06-10 DIAGNOSIS — I10 Essential (primary) hypertension: Secondary | ICD-10-CM | POA: Diagnosis not present

## 2016-06-22 ENCOUNTER — Ambulatory Visit: Payer: Medicare Other | Admitting: Nutrition

## 2016-06-22 ENCOUNTER — Ambulatory Visit: Payer: Medicare Other | Admitting: "Endocrinology

## 2016-07-01 DIAGNOSIS — E119 Type 2 diabetes mellitus without complications: Secondary | ICD-10-CM | POA: Diagnosis not present

## 2016-07-01 DIAGNOSIS — S82841A Displaced bimalleolar fracture of right lower leg, initial encounter for closed fracture: Secondary | ICD-10-CM | POA: Diagnosis not present

## 2016-07-01 DIAGNOSIS — W19XXXA Unspecified fall, initial encounter: Secondary | ICD-10-CM | POA: Diagnosis not present

## 2016-07-01 DIAGNOSIS — I10 Essential (primary) hypertension: Secondary | ICD-10-CM | POA: Diagnosis not present

## 2016-07-04 DIAGNOSIS — E7800 Pure hypercholesterolemia, unspecified: Secondary | ICD-10-CM | POA: Diagnosis not present

## 2016-07-04 DIAGNOSIS — S82891D Other fracture of right lower leg, subsequent encounter for closed fracture with routine healing: Secondary | ICD-10-CM | POA: Diagnosis not present

## 2016-07-04 DIAGNOSIS — W19XXXD Unspecified fall, subsequent encounter: Secondary | ICD-10-CM | POA: Diagnosis not present

## 2016-07-04 DIAGNOSIS — E1140 Type 2 diabetes mellitus with diabetic neuropathy, unspecified: Secondary | ICD-10-CM | POA: Diagnosis not present

## 2016-07-04 DIAGNOSIS — Z6837 Body mass index (BMI) 37.0-37.9, adult: Secondary | ICD-10-CM | POA: Diagnosis not present

## 2016-07-04 DIAGNOSIS — I10 Essential (primary) hypertension: Secondary | ICD-10-CM | POA: Diagnosis not present

## 2016-07-04 DIAGNOSIS — Z7982 Long term (current) use of aspirin: Secondary | ICD-10-CM | POA: Diagnosis not present

## 2016-07-04 DIAGNOSIS — F419 Anxiety disorder, unspecified: Secondary | ICD-10-CM | POA: Diagnosis not present

## 2016-07-04 DIAGNOSIS — Z794 Long term (current) use of insulin: Secondary | ICD-10-CM | POA: Diagnosis not present

## 2016-07-04 DIAGNOSIS — K219 Gastro-esophageal reflux disease without esophagitis: Secondary | ICD-10-CM | POA: Diagnosis not present

## 2016-07-04 DIAGNOSIS — Y92012 Bathroom of single-family (private) house as the place of occurrence of the external cause: Secondary | ICD-10-CM | POA: Diagnosis not present

## 2016-07-04 DIAGNOSIS — F329 Major depressive disorder, single episode, unspecified: Secondary | ICD-10-CM | POA: Diagnosis not present

## 2016-07-06 DIAGNOSIS — E7800 Pure hypercholesterolemia, unspecified: Secondary | ICD-10-CM | POA: Diagnosis not present

## 2016-07-06 DIAGNOSIS — E1140 Type 2 diabetes mellitus with diabetic neuropathy, unspecified: Secondary | ICD-10-CM | POA: Diagnosis not present

## 2016-07-06 DIAGNOSIS — F419 Anxiety disorder, unspecified: Secondary | ICD-10-CM | POA: Diagnosis not present

## 2016-07-06 DIAGNOSIS — F329 Major depressive disorder, single episode, unspecified: Secondary | ICD-10-CM | POA: Diagnosis not present

## 2016-07-06 DIAGNOSIS — S82891D Other fracture of right lower leg, subsequent encounter for closed fracture with routine healing: Secondary | ICD-10-CM | POA: Diagnosis not present

## 2016-07-06 DIAGNOSIS — I10 Essential (primary) hypertension: Secondary | ICD-10-CM | POA: Diagnosis not present

## 2016-07-07 DIAGNOSIS — F419 Anxiety disorder, unspecified: Secondary | ICD-10-CM | POA: Diagnosis not present

## 2016-07-07 DIAGNOSIS — E7800 Pure hypercholesterolemia, unspecified: Secondary | ICD-10-CM | POA: Diagnosis not present

## 2016-07-07 DIAGNOSIS — F329 Major depressive disorder, single episode, unspecified: Secondary | ICD-10-CM | POA: Diagnosis not present

## 2016-07-07 DIAGNOSIS — S82891D Other fracture of right lower leg, subsequent encounter for closed fracture with routine healing: Secondary | ICD-10-CM | POA: Diagnosis not present

## 2016-07-07 DIAGNOSIS — E1140 Type 2 diabetes mellitus with diabetic neuropathy, unspecified: Secondary | ICD-10-CM | POA: Diagnosis not present

## 2016-07-07 DIAGNOSIS — I10 Essential (primary) hypertension: Secondary | ICD-10-CM | POA: Diagnosis not present

## 2016-07-11 DIAGNOSIS — F419 Anxiety disorder, unspecified: Secondary | ICD-10-CM | POA: Diagnosis not present

## 2016-07-11 DIAGNOSIS — E1140 Type 2 diabetes mellitus with diabetic neuropathy, unspecified: Secondary | ICD-10-CM | POA: Diagnosis not present

## 2016-07-11 DIAGNOSIS — F329 Major depressive disorder, single episode, unspecified: Secondary | ICD-10-CM | POA: Diagnosis not present

## 2016-07-11 DIAGNOSIS — E7800 Pure hypercholesterolemia, unspecified: Secondary | ICD-10-CM | POA: Diagnosis not present

## 2016-07-11 DIAGNOSIS — I10 Essential (primary) hypertension: Secondary | ICD-10-CM | POA: Diagnosis not present

## 2016-07-11 DIAGNOSIS — S82891D Other fracture of right lower leg, subsequent encounter for closed fracture with routine healing: Secondary | ICD-10-CM | POA: Diagnosis not present

## 2016-07-12 DIAGNOSIS — F419 Anxiety disorder, unspecified: Secondary | ICD-10-CM | POA: Diagnosis not present

## 2016-07-12 DIAGNOSIS — S82841A Displaced bimalleolar fracture of right lower leg, initial encounter for closed fracture: Secondary | ICD-10-CM | POA: Diagnosis not present

## 2016-07-12 DIAGNOSIS — F329 Major depressive disorder, single episode, unspecified: Secondary | ICD-10-CM | POA: Diagnosis not present

## 2016-07-12 DIAGNOSIS — E1140 Type 2 diabetes mellitus with diabetic neuropathy, unspecified: Secondary | ICD-10-CM | POA: Diagnosis not present

## 2016-07-12 DIAGNOSIS — E7800 Pure hypercholesterolemia, unspecified: Secondary | ICD-10-CM | POA: Diagnosis not present

## 2016-07-12 DIAGNOSIS — S82891D Other fracture of right lower leg, subsequent encounter for closed fracture with routine healing: Secondary | ICD-10-CM | POA: Diagnosis not present

## 2016-07-12 DIAGNOSIS — I10 Essential (primary) hypertension: Secondary | ICD-10-CM | POA: Diagnosis not present

## 2016-07-13 DIAGNOSIS — E7800 Pure hypercholesterolemia, unspecified: Secondary | ICD-10-CM | POA: Diagnosis not present

## 2016-07-13 DIAGNOSIS — E1140 Type 2 diabetes mellitus with diabetic neuropathy, unspecified: Secondary | ICD-10-CM | POA: Diagnosis not present

## 2016-07-13 DIAGNOSIS — I10 Essential (primary) hypertension: Secondary | ICD-10-CM | POA: Diagnosis not present

## 2016-07-13 DIAGNOSIS — F329 Major depressive disorder, single episode, unspecified: Secondary | ICD-10-CM | POA: Diagnosis not present

## 2016-07-13 DIAGNOSIS — S82891D Other fracture of right lower leg, subsequent encounter for closed fracture with routine healing: Secondary | ICD-10-CM | POA: Diagnosis not present

## 2016-07-13 DIAGNOSIS — F419 Anxiety disorder, unspecified: Secondary | ICD-10-CM | POA: Diagnosis not present

## 2016-07-14 DIAGNOSIS — I10 Essential (primary) hypertension: Secondary | ICD-10-CM | POA: Diagnosis not present

## 2016-07-14 DIAGNOSIS — F419 Anxiety disorder, unspecified: Secondary | ICD-10-CM | POA: Diagnosis not present

## 2016-07-14 DIAGNOSIS — F329 Major depressive disorder, single episode, unspecified: Secondary | ICD-10-CM | POA: Diagnosis not present

## 2016-07-14 DIAGNOSIS — E1140 Type 2 diabetes mellitus with diabetic neuropathy, unspecified: Secondary | ICD-10-CM | POA: Diagnosis not present

## 2016-07-14 DIAGNOSIS — S82891D Other fracture of right lower leg, subsequent encounter for closed fracture with routine healing: Secondary | ICD-10-CM | POA: Diagnosis not present

## 2016-07-14 DIAGNOSIS — E7800 Pure hypercholesterolemia, unspecified: Secondary | ICD-10-CM | POA: Diagnosis not present

## 2016-07-15 DIAGNOSIS — F419 Anxiety disorder, unspecified: Secondary | ICD-10-CM | POA: Diagnosis not present

## 2016-07-15 DIAGNOSIS — S82891D Other fracture of right lower leg, subsequent encounter for closed fracture with routine healing: Secondary | ICD-10-CM | POA: Diagnosis not present

## 2016-07-15 DIAGNOSIS — E7800 Pure hypercholesterolemia, unspecified: Secondary | ICD-10-CM | POA: Diagnosis not present

## 2016-07-15 DIAGNOSIS — E1140 Type 2 diabetes mellitus with diabetic neuropathy, unspecified: Secondary | ICD-10-CM | POA: Diagnosis not present

## 2016-07-15 DIAGNOSIS — F329 Major depressive disorder, single episode, unspecified: Secondary | ICD-10-CM | POA: Diagnosis not present

## 2016-07-15 DIAGNOSIS — I10 Essential (primary) hypertension: Secondary | ICD-10-CM | POA: Diagnosis not present

## 2016-07-19 DIAGNOSIS — F329 Major depressive disorder, single episode, unspecified: Secondary | ICD-10-CM | POA: Diagnosis not present

## 2016-07-19 DIAGNOSIS — I10 Essential (primary) hypertension: Secondary | ICD-10-CM | POA: Diagnosis not present

## 2016-07-19 DIAGNOSIS — S82891D Other fracture of right lower leg, subsequent encounter for closed fracture with routine healing: Secondary | ICD-10-CM | POA: Diagnosis not present

## 2016-07-19 DIAGNOSIS — F419 Anxiety disorder, unspecified: Secondary | ICD-10-CM | POA: Diagnosis not present

## 2016-07-19 DIAGNOSIS — E1140 Type 2 diabetes mellitus with diabetic neuropathy, unspecified: Secondary | ICD-10-CM | POA: Diagnosis not present

## 2016-07-19 DIAGNOSIS — E7800 Pure hypercholesterolemia, unspecified: Secondary | ICD-10-CM | POA: Diagnosis not present

## 2016-07-20 DIAGNOSIS — Z299 Encounter for prophylactic measures, unspecified: Secondary | ICD-10-CM | POA: Diagnosis not present

## 2016-07-20 DIAGNOSIS — Z0000 Encounter for general adult medical examination without abnormal findings: Secondary | ICD-10-CM | POA: Diagnosis not present

## 2016-07-20 DIAGNOSIS — Z1389 Encounter for screening for other disorder: Secondary | ICD-10-CM | POA: Diagnosis not present

## 2016-07-20 DIAGNOSIS — Z79899 Other long term (current) drug therapy: Secondary | ICD-10-CM | POA: Diagnosis not present

## 2016-07-20 DIAGNOSIS — Z7189 Other specified counseling: Secondary | ICD-10-CM | POA: Diagnosis not present

## 2016-07-20 DIAGNOSIS — Z6834 Body mass index (BMI) 34.0-34.9, adult: Secondary | ICD-10-CM | POA: Diagnosis not present

## 2016-07-20 DIAGNOSIS — R5383 Other fatigue: Secondary | ICD-10-CM | POA: Diagnosis not present

## 2016-07-20 DIAGNOSIS — E559 Vitamin D deficiency, unspecified: Secondary | ICD-10-CM | POA: Diagnosis not present

## 2016-07-20 DIAGNOSIS — E7800 Pure hypercholesterolemia, unspecified: Secondary | ICD-10-CM | POA: Diagnosis not present

## 2016-07-20 DIAGNOSIS — F329 Major depressive disorder, single episode, unspecified: Secondary | ICD-10-CM | POA: Diagnosis not present

## 2016-07-21 DIAGNOSIS — I10 Essential (primary) hypertension: Secondary | ICD-10-CM | POA: Diagnosis not present

## 2016-07-21 DIAGNOSIS — E1140 Type 2 diabetes mellitus with diabetic neuropathy, unspecified: Secondary | ICD-10-CM | POA: Diagnosis not present

## 2016-07-21 DIAGNOSIS — S82891D Other fracture of right lower leg, subsequent encounter for closed fracture with routine healing: Secondary | ICD-10-CM | POA: Diagnosis not present

## 2016-07-21 DIAGNOSIS — F329 Major depressive disorder, single episode, unspecified: Secondary | ICD-10-CM | POA: Diagnosis not present

## 2016-07-21 DIAGNOSIS — E7800 Pure hypercholesterolemia, unspecified: Secondary | ICD-10-CM | POA: Diagnosis not present

## 2016-07-21 DIAGNOSIS — F419 Anxiety disorder, unspecified: Secondary | ICD-10-CM | POA: Diagnosis not present

## 2016-07-26 DIAGNOSIS — F419 Anxiety disorder, unspecified: Secondary | ICD-10-CM | POA: Diagnosis not present

## 2016-07-26 DIAGNOSIS — F329 Major depressive disorder, single episode, unspecified: Secondary | ICD-10-CM | POA: Diagnosis not present

## 2016-07-26 DIAGNOSIS — S82891D Other fracture of right lower leg, subsequent encounter for closed fracture with routine healing: Secondary | ICD-10-CM | POA: Diagnosis not present

## 2016-07-26 DIAGNOSIS — E1140 Type 2 diabetes mellitus with diabetic neuropathy, unspecified: Secondary | ICD-10-CM | POA: Diagnosis not present

## 2016-07-26 DIAGNOSIS — I10 Essential (primary) hypertension: Secondary | ICD-10-CM | POA: Diagnosis not present

## 2016-07-26 DIAGNOSIS — E7800 Pure hypercholesterolemia, unspecified: Secondary | ICD-10-CM | POA: Diagnosis not present

## 2016-07-27 DIAGNOSIS — D72829 Elevated white blood cell count, unspecified: Secondary | ICD-10-CM | POA: Diagnosis not present

## 2016-07-27 DIAGNOSIS — F329 Major depressive disorder, single episode, unspecified: Secondary | ICD-10-CM | POA: Diagnosis not present

## 2016-07-27 DIAGNOSIS — K219 Gastro-esophageal reflux disease without esophagitis: Secondary | ICD-10-CM | POA: Diagnosis not present

## 2016-07-27 DIAGNOSIS — Z6834 Body mass index (BMI) 34.0-34.9, adult: Secondary | ICD-10-CM | POA: Diagnosis not present

## 2016-07-27 DIAGNOSIS — E1142 Type 2 diabetes mellitus with diabetic polyneuropathy: Secondary | ICD-10-CM | POA: Diagnosis not present

## 2016-07-27 DIAGNOSIS — Z789 Other specified health status: Secondary | ICD-10-CM | POA: Diagnosis not present

## 2016-07-27 DIAGNOSIS — Z299 Encounter for prophylactic measures, unspecified: Secondary | ICD-10-CM | POA: Diagnosis not present

## 2016-07-28 DIAGNOSIS — I10 Essential (primary) hypertension: Secondary | ICD-10-CM | POA: Diagnosis not present

## 2016-07-28 DIAGNOSIS — F419 Anxiety disorder, unspecified: Secondary | ICD-10-CM | POA: Diagnosis not present

## 2016-07-28 DIAGNOSIS — F329 Major depressive disorder, single episode, unspecified: Secondary | ICD-10-CM | POA: Diagnosis not present

## 2016-07-28 DIAGNOSIS — E7800 Pure hypercholesterolemia, unspecified: Secondary | ICD-10-CM | POA: Diagnosis not present

## 2016-07-28 DIAGNOSIS — E1140 Type 2 diabetes mellitus with diabetic neuropathy, unspecified: Secondary | ICD-10-CM | POA: Diagnosis not present

## 2016-07-28 DIAGNOSIS — S82891D Other fracture of right lower leg, subsequent encounter for closed fracture with routine healing: Secondary | ICD-10-CM | POA: Diagnosis not present

## 2016-08-02 DIAGNOSIS — Z1231 Encounter for screening mammogram for malignant neoplasm of breast: Secondary | ICD-10-CM | POA: Diagnosis not present

## 2016-08-04 DIAGNOSIS — Z299 Encounter for prophylactic measures, unspecified: Secondary | ICD-10-CM | POA: Diagnosis not present

## 2016-08-04 DIAGNOSIS — D509 Iron deficiency anemia, unspecified: Secondary | ICD-10-CM | POA: Diagnosis not present

## 2016-08-04 DIAGNOSIS — D72829 Elevated white blood cell count, unspecified: Secondary | ICD-10-CM | POA: Diagnosis not present

## 2016-08-04 DIAGNOSIS — E559 Vitamin D deficiency, unspecified: Secondary | ICD-10-CM | POA: Diagnosis not present

## 2016-08-08 DIAGNOSIS — E1165 Type 2 diabetes mellitus with hyperglycemia: Secondary | ICD-10-CM | POA: Diagnosis not present

## 2016-08-08 DIAGNOSIS — M79604 Pain in right leg: Secondary | ICD-10-CM | POA: Diagnosis not present

## 2016-08-08 DIAGNOSIS — M79606 Pain in leg, unspecified: Secondary | ICD-10-CM | POA: Diagnosis not present

## 2016-08-08 DIAGNOSIS — Z794 Long term (current) use of insulin: Secondary | ICD-10-CM | POA: Diagnosis not present

## 2016-08-08 DIAGNOSIS — E118 Type 2 diabetes mellitus with unspecified complications: Secondary | ICD-10-CM | POA: Diagnosis not present

## 2016-08-08 DIAGNOSIS — M79605 Pain in left leg: Secondary | ICD-10-CM | POA: Diagnosis not present

## 2016-08-08 LAB — HEMOGLOBIN A1C: Hemoglobin A1C: 9.6

## 2016-08-16 DIAGNOSIS — D72829 Elevated white blood cell count, unspecified: Secondary | ICD-10-CM | POA: Diagnosis not present

## 2016-08-16 DIAGNOSIS — R5382 Chronic fatigue, unspecified: Secondary | ICD-10-CM | POA: Diagnosis not present

## 2016-08-18 DIAGNOSIS — E119 Type 2 diabetes mellitus without complications: Secondary | ICD-10-CM | POA: Diagnosis not present

## 2016-08-18 DIAGNOSIS — I10 Essential (primary) hypertension: Secondary | ICD-10-CM | POA: Diagnosis not present

## 2016-08-18 DIAGNOSIS — E7800 Pure hypercholesterolemia, unspecified: Secondary | ICD-10-CM | POA: Diagnosis not present

## 2016-08-23 ENCOUNTER — Ambulatory Visit (INDEPENDENT_AMBULATORY_CARE_PROVIDER_SITE_OTHER): Payer: Medicare Other | Admitting: "Endocrinology

## 2016-08-23 ENCOUNTER — Encounter: Payer: Self-pay | Admitting: "Endocrinology

## 2016-08-23 VITALS — BP 162/93 | HR 96 | Ht 62.5 in | Wt 195.0 lb

## 2016-08-23 DIAGNOSIS — E782 Mixed hyperlipidemia: Secondary | ICD-10-CM | POA: Diagnosis not present

## 2016-08-23 DIAGNOSIS — Z9119 Patient's noncompliance with other medical treatment and regimen: Secondary | ICD-10-CM | POA: Diagnosis not present

## 2016-08-23 DIAGNOSIS — Z794 Long term (current) use of insulin: Secondary | ICD-10-CM | POA: Diagnosis not present

## 2016-08-23 DIAGNOSIS — E1165 Type 2 diabetes mellitus with hyperglycemia: Secondary | ICD-10-CM | POA: Diagnosis not present

## 2016-08-23 DIAGNOSIS — IMO0002 Reserved for concepts with insufficient information to code with codable children: Secondary | ICD-10-CM

## 2016-08-23 DIAGNOSIS — I10 Essential (primary) hypertension: Secondary | ICD-10-CM | POA: Diagnosis not present

## 2016-08-23 DIAGNOSIS — Z91199 Patient's noncompliance with other medical treatment and regimen due to unspecified reason: Secondary | ICD-10-CM

## 2016-08-23 DIAGNOSIS — E118 Type 2 diabetes mellitus with unspecified complications: Secondary | ICD-10-CM

## 2016-08-23 NOTE — Progress Notes (Signed)
Subjective:    Patient ID: Carrie Turner, female    DOB: 1950/09/05. Patient is being seen in f/u for management of diabetes requested by  [facility], MD  Past Medical History:  Diagnosis Date  . Anxiety   . Depression   . Diabetes mellitus, type II ([place])   . GERD (gastroesophageal reflux disease)   . Hyperlipidemia   . Hypertension   . Vitamin D deficiency    Past Surgical History:  Procedure Laterality Date  . ABDOMINAL HYSTERECTOMY     Social History   Social History  . Marital status: Single    Spouse name: N/A  . Number of children: N/A  . Years of education: N/A   Social History Main Topics  . Smoking status: Never Smoker  . Smokeless tobacco: Never Used  . Alcohol use None  . Drug use: Unknown  . Sexual activity: Not Asked   Other Topics Concern  . None   Social History Narrative  . None   Outpatient Encounter Prescriptions as of 08/23/2016  Medication Sig  . amLODipine (NORVASC) 2.5 MG tablet Take 2.5 mg by mouth daily.  . Aspirin-Acetaminophen-Caffeine (EXCEDRIN MIGRAINE PO) Take by mouth.  . canagliflozin (INVOKANA) 100 MG TABS tablet Take 1 tablet (100 mg total) by mouth daily before breakfast.  . citalopram (CELEXA) 10 MG tablet Take 10 mg by mouth daily.  Marland Kitchen gabapentin (NEURONTIN) 100 MG capsule Take 300 mg by mouth at bedtime.  . insulin aspart (NOVOLOG FLEXPEN) 100 UNIT/ML FlexPen Inject 10-16 Units into the skin 3 (three) times daily with meals.  . Insulin Detemir (LEVEMIR FLEXTOUCH) 100 UNIT/ML Pen Inject 50 Units into the skin at bedtime.  Marland Kitchen omeprazole (PRILOSEC) 20 MG capsule Take 20 mg by mouth 2 (two) times daily before a meal.   No facility-administered encounter medications on file as of 08/23/2016.    ALLERGIES: Allergies  Allergen Reactions  . Sulfa Antibiotics    VACCINATION STATUS:  There is no immunization history on file for this patient.  Diabetes  She presents for her follow-up diabetic visit. She has type 2 diabetes  mellitus. Onset time: She was diagnosed at approximate age of 26 years. Her disease course has been improving. There are no hypoglycemic associated symptoms. Pertinent negatives for hypoglycemia include no confusion, headaches, pallor or seizures. Associated symptoms include blurred vision, fatigue, polydipsia and polyuria. Pertinent negatives for diabetes include no chest pain and no polyphagia. There are no hypoglycemic complications. Symptoms are improving. There are no diabetic complications. Risk factors for coronary artery disease include diabetes mellitus, dyslipidemia, obesity and sedentary lifestyle. Current diabetic treatment includes insulin injections (She did not freeze her insulin dose as recommended during her last visit.). She is compliant with treatment some of the time. Her weight is increasing steadily. She is following a generally unhealthy diet. When asked about meal planning, she reported none. She has not had a previous visit with a dietitian (She will see the dietitian today.). She never participates in exercise. Her home blood glucose trend is decreasing steadily. Her breakfast blood glucose range is generally >200 mg/dl. Her lunch blood glucose range is generally >200 mg/dl. Her dinner blood glucose range is generally >200 mg/dl. Her overall blood glucose range is >200 mg/dl. An ACE inhibitor/angiotensin II receptor blocker is not being taken. Eye exam is current.  Hyperlipidemia  This is a chronic problem. The current episode started more than 1 year ago. Recent lipid tests were reviewed and are high. Exacerbating diseases include diabetes  and obesity. Pertinent negatives include no chest pain, myalgias or shortness of breath. She is currently on no antihyperlipidemic treatment. Compliance problems include adherence to diet and adherence to exercise.  Risk factors for coronary artery disease include diabetes mellitus, dyslipidemia, hypertension, obesity and a sedentary lifestyle.   Hypertension  This is a chronic problem. The current episode started more than 1 year ago. The problem is controlled. Associated symptoms include blurred vision. Pertinent negatives include no chest pain, headaches, palpitations or shortness of breath. Risk factors for coronary artery disease include diabetes mellitus, dyslipidemia and sedentary lifestyle. Past treatments include calcium channel blockers. Compliance problems include diet and exercise.      Review of Systems  Constitutional: Positive for fatigue. Negative for unexpected weight change.  HENT: Negative for trouble swallowing and voice change.   Eyes: Positive for blurred vision. Negative for visual disturbance.  Respiratory: Negative for cough, shortness of breath and wheezing.   Cardiovascular: Negative for chest pain, palpitations and leg swelling.  Gastrointestinal: Negative for diarrhea, nausea and vomiting.  Endocrine: Positive for polydipsia and polyuria. Negative for cold intolerance, heat intolerance and polyphagia.  Genitourinary: Positive for frequency. Negative for dysuria and flank pain.  Musculoskeletal: Negative for arthralgias and myalgias.  Skin: Negative for color change, pallor, rash and wound.  Neurological: Negative for seizures and headaches.  Psychiatric/Behavioral: Negative for confusion and suicidal ideas.    Objective:    BP (!) 162/93   Pulse 96   Ht 5' 2.5" (1.588 m)   Wt 195 lb (88.5 kg)   BMI 35.10 kg/m   Wt Readings from Last 3 Encounters:  08/23/16 195 lb (88.5 kg)  04/25/16 192 lb (87.1 kg)  03/21/16 192 lb (87.1 kg)    Physical Exam  Constitutional: She is oriented to person, place, and time. She appears well-developed.  HENT:  Head: Normocephalic and atraumatic.  Eyes: EOM are normal.  Neck: Normal range of motion. Neck supple. No tracheal deviation present. No thyromegaly present.  Cardiovascular: Normal rate and regular rhythm.   Pulmonary/Chest: Effort normal and breath  sounds normal.  Abdominal: Soft. Bowel sounds are normal. There is no tenderness. There is no guarding.  Musculoskeletal: Normal range of motion. She exhibits no edema.  Neurological: She is alert and oriented to person, place, and time. She has normal reflexes. No cranial nerve deficit. Coordination normal.  Skin: Skin is warm and dry. No rash noted. No erythema. No pallor.  Psychiatric: She has a normal mood and affect. Judgment normal.   Labs: 08/08/2016 show A1c of 9.6%. 04/20/2016: Her A1c was 11.4% A1c was 10.3% on 01/12/2016,  vitamin D low at 20.8 on 09/25/2015, liver function test was abnormal with alkaline phosphatase 147 (normal 39-1 17), AST 62, ALT 66,   Assessment & Plan:   1. Uncontrolled type 2 diabetes mellitus with complication, with long-term current use of insulin ([place])  - Patient has currently uncontrolled symptomatic type 2 DM since  66 years of age,  with most recent A1c of  9.6% improving from 11.4%. -Recent labs reviewed, showing normal renal function, transaminases back to normal.   Her diabetes is complicated by obesity and sedentary life and patient remains at a high risk for more acute and chronic complications of diabetes which include CAD, CVA, CKD, retinopathy, and neuropathy. These are all discussed in detail with the patient.  - I have counseled the patient on diet management and weight loss, by adopting a carbohydrate restricted/protein rich diet.  - Suggestion is made for  patient to avoid simple carbohydrates   from their diet including Cakes , Desserts, Ice Cream,  Soda (  diet and regular) , Sweet Tea , Candies,  Chips, Cookies, Artificial Sweeteners,   and "Sugar-free" Products . This will help patient to have stable blood glucose profile and potentially avoid unintended weight gain.  - I encouraged the patient to switch to  unprocessed or minimally processed complex starch and increased protein intake (animal or plant source), fruits, and  vegetables.  - Patient is advised to stick to a routine mealtimes to eat 3 meals  a day and avoid unnecessary snacks ( to snack only to correct hypoglycemia).  - The patient will be scheduled with Jearld Fenton, RDN, CDE for individualized DM education.  - I have approached patient with the following individualized plan to manage diabetes and patient agrees:   -She will continue to require basal/bolus insulin. - I advised her to increase her Levemir to 50 units daily at bedtime, continue prandial insulin NovoLog  10 units TIDAC for pre-meal BG readings of 90-150mg /dl, plus patient specific correction dose for unexpected hyperglycemia above 150mg /dl, associated with strict monitoring of glucose  AC and HS. - Patient is warned not to take insulin without proper monitoring per orders. -Adjustment parameters are given for hypo and hyperglycemia in writing. -Patient is encouraged to call clinic for blood glucose levels less than 70 or above 300 mg /dl. - I will continue Invokana  100 mg by mouth every morning with breakfast, therapeutically suitable for patient- side effects and precautions discussed with her. -  Her liver function tests is improving, I advised her to stay off of metformin for now.  - Patient will be considered for incretin therapy as appropriate next visit. - Patient specific target  A1c;  LDL, HDL, Triglycerides, and  Waist Circumference were discussed in detail.  2) BP/HTN: uncontrolled.   He says she did not take her blood pressure medications today. She is advised to take her blood pressure medications always in the morning. She will be considered for ACE inhibitor/ARB next visit.  3) Lipids/HPL:  Control unknown, I would obtain fasting lipid panel on subsequent visits. She is not on statins likely due to a concern of elevated transaminases. -However she will be reassessed for utility of statins after her next labs. 4)  obesity: CDE Consult in progress , exercise, and detailed  carbohydrates information provided.  5) Chronic Care/Health Maintenance:  -Patient is encouraged to continue to follow up with Ophthalmology, Podiatrist at least yearly or according to recommendations, and advised to   stay away from smoking. I have recommended yearly flu vaccine and pneumonia vaccination at least every 5 years; moderate intensity exercise for up to 150 minutes weekly; and  sleep for at least 7 hours a day.  - 25 minutes of time was spent on the care of this patient , 50% of which was applied for counseling on diabetes complications and their preventions.  - Patient to bring meter and  blood glucose logs during her next visit.  - I advised patient to maintain close follow up with [facility], MD for primary care needs.  Follow up plan: - No Follow-up on file.  Glade Lloyd, MD Phone: [phone]  Fax: [phone]   08/23/2016, 2:52 PM

## 2016-09-12 DIAGNOSIS — Z713 Dietary counseling and surveillance: Secondary | ICD-10-CM | POA: Diagnosis not present

## 2016-09-12 DIAGNOSIS — F329 Major depressive disorder, single episode, unspecified: Secondary | ICD-10-CM | POA: Diagnosis not present

## 2016-09-12 DIAGNOSIS — I10 Essential (primary) hypertension: Secondary | ICD-10-CM | POA: Diagnosis not present

## 2016-09-12 DIAGNOSIS — E1142 Type 2 diabetes mellitus with diabetic polyneuropathy: Secondary | ICD-10-CM | POA: Diagnosis not present

## 2016-09-12 DIAGNOSIS — Z789 Other specified health status: Secondary | ICD-10-CM | POA: Diagnosis not present

## 2016-09-12 DIAGNOSIS — Z299 Encounter for prophylactic measures, unspecified: Secondary | ICD-10-CM | POA: Diagnosis not present

## 2016-09-12 DIAGNOSIS — Z6835 Body mass index (BMI) 35.0-35.9, adult: Secondary | ICD-10-CM | POA: Diagnosis not present

## 2016-09-12 DIAGNOSIS — E7800 Pure hypercholesterolemia, unspecified: Secondary | ICD-10-CM | POA: Diagnosis not present

## 2016-09-14 DIAGNOSIS — F339 Major depressive disorder, recurrent, unspecified: Secondary | ICD-10-CM | POA: Diagnosis not present

## 2016-09-20 DIAGNOSIS — I10 Essential (primary) hypertension: Secondary | ICD-10-CM | POA: Diagnosis not present

## 2016-09-20 DIAGNOSIS — E119 Type 2 diabetes mellitus without complications: Secondary | ICD-10-CM | POA: Diagnosis not present

## 2016-09-20 DIAGNOSIS — E7800 Pure hypercholesterolemia, unspecified: Secondary | ICD-10-CM | POA: Diagnosis not present

## 2016-09-23 DIAGNOSIS — G4700 Insomnia, unspecified: Secondary | ICD-10-CM | POA: Diagnosis not present

## 2016-09-23 DIAGNOSIS — I10 Essential (primary) hypertension: Secondary | ICD-10-CM | POA: Diagnosis not present

## 2016-09-29 ENCOUNTER — Other Ambulatory Visit: Payer: Self-pay

## 2016-09-29 DIAGNOSIS — H52223 Regular astigmatism, bilateral: Secondary | ICD-10-CM | POA: Diagnosis not present

## 2016-09-29 DIAGNOSIS — H5203 Hypermetropia, bilateral: Secondary | ICD-10-CM | POA: Diagnosis not present

## 2016-09-29 DIAGNOSIS — H25813 Combined forms of age-related cataract, bilateral: Secondary | ICD-10-CM | POA: Diagnosis not present

## 2016-09-29 DIAGNOSIS — H40001 Preglaucoma, unspecified, right eye: Secondary | ICD-10-CM | POA: Diagnosis not present

## 2016-09-29 DIAGNOSIS — E119 Type 2 diabetes mellitus without complications: Secondary | ICD-10-CM | POA: Diagnosis not present

## 2016-09-29 DIAGNOSIS — H40002 Preglaucoma, unspecified, left eye: Secondary | ICD-10-CM | POA: Diagnosis not present

## 2016-09-29 DIAGNOSIS — H2513 Age-related nuclear cataract, bilateral: Secondary | ICD-10-CM | POA: Diagnosis not present

## 2016-09-29 DIAGNOSIS — Z7984 Long term (current) use of oral hypoglycemic drugs: Secondary | ICD-10-CM | POA: Diagnosis not present

## 2016-09-29 DIAGNOSIS — H25013 Cortical age-related cataract, bilateral: Secondary | ICD-10-CM | POA: Diagnosis not present

## 2016-09-29 DIAGNOSIS — H524 Presbyopia: Secondary | ICD-10-CM | POA: Diagnosis not present

## 2016-09-29 MED ORDER — GLUCOSE BLOOD VI STRP: ORAL_STRIP | 5 refills | Status: DC

## 2016-10-03 ENCOUNTER — Other Ambulatory Visit: Payer: Self-pay

## 2016-10-03 MED ORDER — INSULIN DETEMIR 100 UNIT/ML FLEXPEN: 50.0000 [IU] | PEN_INJECTOR | Freq: Every day | SUBCUTANEOUS | 2 refills | Status: DC

## 2016-10-05 ENCOUNTER — Encounter (HOSPITAL_COMMUNITY): Payer: Medicare Other | Attending: Oncology | Admitting: Oncology

## 2016-10-05 ENCOUNTER — Other Ambulatory Visit (HOSPITAL_COMMUNITY): Admission: RE | Admit: 2016-10-05 | Payer: Medicare Other | Source: Other Acute Inpatient Hospital | Admitting: Oncology

## 2016-10-05 ENCOUNTER — Encounter (HOSPITAL_COMMUNITY): Payer: Self-pay

## 2016-10-05 ENCOUNTER — Encounter (HOSPITAL_COMMUNITY): Payer: Medicare Other

## 2016-10-05 DIAGNOSIS — Z811 Family history of alcohol abuse and dependence: Secondary | ICD-10-CM | POA: Insufficient documentation

## 2016-10-05 DIAGNOSIS — Z90710 Acquired absence of both cervix and uterus: Secondary | ICD-10-CM | POA: Insufficient documentation

## 2016-10-05 DIAGNOSIS — F329 Major depressive disorder, single episode, unspecified: Secondary | ICD-10-CM | POA: Insufficient documentation

## 2016-10-05 DIAGNOSIS — D473 Essential (hemorrhagic) thrombocythemia: Secondary | ICD-10-CM | POA: Insufficient documentation

## 2016-10-05 DIAGNOSIS — F419 Anxiety disorder, unspecified: Secondary | ICD-10-CM | POA: Diagnosis not present

## 2016-10-05 DIAGNOSIS — Z8249 Family history of ischemic heart disease and other diseases of the circulatory system: Secondary | ICD-10-CM | POA: Diagnosis not present

## 2016-10-05 DIAGNOSIS — Z7982 Long term (current) use of aspirin: Secondary | ICD-10-CM | POA: Insufficient documentation

## 2016-10-05 DIAGNOSIS — D649 Anemia, unspecified: Secondary | ICD-10-CM

## 2016-10-05 DIAGNOSIS — E119 Type 2 diabetes mellitus without complications: Secondary | ICD-10-CM | POA: Diagnosis not present

## 2016-10-05 DIAGNOSIS — Z79899 Other long term (current) drug therapy: Secondary | ICD-10-CM | POA: Insufficient documentation

## 2016-10-05 DIAGNOSIS — Z809 Family history of malignant neoplasm, unspecified: Secondary | ICD-10-CM | POA: Diagnosis not present

## 2016-10-05 DIAGNOSIS — D75839 Thrombocytosis, unspecified: Secondary | ICD-10-CM

## 2016-10-05 DIAGNOSIS — Z882 Allergy status to sulfonamides status: Secondary | ICD-10-CM | POA: Diagnosis not present

## 2016-10-05 DIAGNOSIS — E559 Vitamin D deficiency, unspecified: Secondary | ICD-10-CM | POA: Insufficient documentation

## 2016-10-05 DIAGNOSIS — K449 Diaphragmatic hernia without obstruction or gangrene: Secondary | ICD-10-CM | POA: Insufficient documentation

## 2016-10-05 DIAGNOSIS — E785 Hyperlipidemia, unspecified: Secondary | ICD-10-CM | POA: Insufficient documentation

## 2016-10-05 DIAGNOSIS — I10 Essential (primary) hypertension: Secondary | ICD-10-CM | POA: Diagnosis not present

## 2016-10-05 DIAGNOSIS — Z7984 Long term (current) use of oral hypoglycemic drugs: Secondary | ICD-10-CM | POA: Diagnosis not present

## 2016-10-05 DIAGNOSIS — K219 Gastro-esophageal reflux disease without esophagitis: Secondary | ICD-10-CM | POA: Insufficient documentation

## 2016-10-05 DIAGNOSIS — D72829 Elevated white blood cell count, unspecified: Secondary | ICD-10-CM | POA: Diagnosis not present

## 2016-10-05 DIAGNOSIS — D72821 Monocytosis (symptomatic): Secondary | ICD-10-CM | POA: Diagnosis not present

## 2016-10-05 DIAGNOSIS — D72820 Lymphocytosis (symptomatic): Secondary | ICD-10-CM | POA: Diagnosis not present

## 2016-10-05 DIAGNOSIS — J449 Chronic obstructive pulmonary disease, unspecified: Secondary | ICD-10-CM

## 2016-10-05 LAB — CBC WITH DIFFERENTIAL/PLATELET
BASOS ABS: 0.1 10*3/uL (ref 0.0–0.1)
Basophils Relative: 1 %
EOS ABS: 0.4 10*3/uL (ref 0.0–0.7)
EOS PCT: 3 %
HCT: 38.9 % (ref 36.0–46.0)
Hemoglobin: 12.3 g/dL (ref 12.0–15.0)
LYMPHS PCT: 31 %
Lymphs Abs: 4.2 10*3/uL — ABNORMAL HIGH (ref 0.7–4.0)
MCH: 27.9 pg (ref 26.0–34.0)
MCHC: 31.6 g/dL (ref 30.0–36.0)
MCV: 88.2 fL (ref 78.0–100.0)
Monocytes Absolute: 0.9 10*3/uL (ref 0.1–1.0)
Monocytes Relative: 7 %
NEUTROS PCT: 58 %
Neutro Abs: 7.9 10*3/uL — ABNORMAL HIGH (ref 1.7–7.7)
PLATELETS: 494 10*3/uL — AB (ref 150–400)
RBC: 4.41 MIL/uL (ref 3.87–5.11)
RDW: 14.8 % (ref 11.5–15.5)
WBC: 13.4 10*3/uL — AB (ref 4.0–10.5)

## 2016-10-05 LAB — FERRITIN: FERRITIN: 18 ng/mL (ref 11–307)

## 2016-10-05 LAB — COMPREHENSIVE METABOLIC PANEL
ALBUMIN: 4 g/dL (ref 3.5–5.0)
ALT: 17 U/L (ref 14–54)
AST: 17 U/L (ref 15–41)
Alkaline Phosphatase: 131 U/L — ABNORMAL HIGH (ref 38–126)
Anion gap: 8 (ref 5–15)
BUN: 20 mg/dL (ref 6–20)
CHLORIDE: 99 mmol/L — AB (ref 101–111)
CO2: 25 mmol/L (ref 22–32)
CREATININE: 0.5 mg/dL (ref 0.44–1.00)
Calcium: 8.9 mg/dL (ref 8.9–10.3)
GFR calc Af Amer: >60 mL/min (ref >60)
GFR calc non Af Amer: >60 mL/min (ref >60)
Glucose, Bld: 156 mg/dL — ABNORMAL HIGH (ref 65–99)
Potassium: 3.6 mmol/L (ref 3.5–5.1)
SODIUM: 132 mmol/L — AB (ref 135–145)
Total Bilirubin: 0.3 mg/dL (ref 0.3–1.2)
Total Protein: 7.7 g/dL (ref 6.5–8.1)

## 2016-10-05 LAB — IRON AND TIBC
Iron: 57 ug/dL (ref 28–170)
Saturation Ratios: 13 % (ref 10.4–31.8)
TIBC: 447 ug/dL (ref 250–450)
UIBC: 390 ug/dL

## 2016-10-05 NOTE — Patient Instructions (Signed)
Carrie Turner at [facility] Discharge Instructions  RECOMMENDATIONS MADE BY THE CONSULTANT AND ANY TEST RESULTS WILL BE SENT TO YOUR REFERRING PHYSICIAN.  You were seen today by Dr. Twana First You will have lab work done today Follow up in 3 weeks See Amy up front for appointments   Thank you for choosing [place] at [facility] to provide your oncology and hematology care.  To afford each patient quality time with our provider, please arrive at least 15 minutes before your scheduled appointment time.    If you have a lab appointment with the [facility] please come in thru the  Main Entrance and check in at the main information desk  You need to re-schedule your appointment should you arrive 10 or more minutes late.  We strive to give you quality time with our providers, and arriving late affects you and other patients whose appointments are after yours.  Also, if you no show three or more times for appointments you may be dismissed from the clinic at the providers discretion.     Again, thank you for choosing [facility].  Our hope is that these requests will decrease the amount of time that you wait before being seen by our physicians.       _____________________________________________________________  Should you have questions after your visit to [facility], please contact our office at [phone] between the hours of 8:30 a.m. and 4:30 p.m.  Voicemails left after 4:30 p.m. will not be returned until the following business day.  For prescription refill requests, have your pharmacy contact our office.       Resources For Cancer Patients and their Caregivers ? American Cancer Society: Can assist with transportation, wigs, general needs, runs Look Good Feel Better.        (504) 525-6128 ? Cancer Care: Provides financial assistance, online support groups, medication/co-pay assistance.  1-800-813-HOPE  7800669488) ? [facility] Assists [place] cancer patients and their families through emotional , educational and financial support.  9478858698 ? [place] DSS Where to apply for food stamps, Medicaid and utility assistance. (705)743-4197 ? [facility]: Transportation to medical appointments. (305)244-7286 ? Social Security Administration: May apply for disability if have a Stage IV cancer. (978) 210-1067 (938) 456-6647 ? [employer], Disability and Transit Services: Assists with nutrition, care and transit needs. [place] Support Programs: @10RELATIVEDAYS @ > Cancer Support Group  2nd Tuesday of the month 1pm-2pm, Journey Room  > Creative Journey  3rd Tuesday of the month 1130am-1pm, Journey Room  > Look Good Feel Better  1st Wednesday of the month 10am-12 noon, Journey Room (Call [place] [facility] to register [phone])

## 2016-10-05 NOTE — Progress Notes (Signed)
[facility]  CONSULT NOTE  Patient Care Team: Monico Blitz, MD as PCP - General (Internal Medicine)  CHIEF COMPLAINTS/PURPOSE OF CONSULTATION:  Leukocytosis   No history exists.    HISTORY OF PRESENTING ILLNESS:  Carrie Turner 66 y.o. female is here because of leukocytosis.   She is doing well today. She has a hiatal hernia and COPD. She is always hot and tired. She went through menopause in her early 61's. Denies chest pain, SOB, abdominal pain, chronic inflammatory disorders, joint pain, or any other concerns. She has never had her spleen removed. Never smoker.   MEDICAL HISTORY:  Past Medical History:  Diagnosis Date  . Anxiety   . Depression   . Diabetes mellitus, type II ([place])   . GERD (gastroesophageal reflux disease)   . Hyperlipidemia   . Hypertension   . Vitamin D deficiency     SURGICAL HISTORY: Past Surgical History:  Procedure Laterality Date  . ABDOMINAL HYSTERECTOMY      SOCIAL HISTORY: Social History   Social History  . Marital status: Single    Spouse name: N/A  . Number of children: N/A  . Years of education: N/A   Occupational History  . Not on file.   Social History Main Topics  . Smoking status: Never Smoker  . Smokeless tobacco: Never Used  . Alcohol use Not on file  . Drug use: Unknown  . Sexual activity: Not on file   Other Topics Concern  . Not on file   Social History Narrative  . No narrative on file    FAMILY HISTORY: Family History  Problem Relation Age of Onset  . Hypertension Mother   . Alcohol abuse Father   . Cancer Sister     ALLERGIES:  is allergic to sulfa antibiotics.  MEDICATIONS:  Current Outpatient Prescriptions  Medication Sig Dispense Refill  . amLODipine (NORVASC) 2.5 MG tablet Take 2.5 mg by mouth daily.    . Aspirin-Acetaminophen-Caffeine (EXCEDRIN MIGRAINE PO) Take by mouth.    . canagliflozin (INVOKANA) 100 MG TABS tablet Take 1 tablet (100 mg total) by mouth daily before  breakfast. 30 tablet 2  . citalopram (CELEXA) 10 MG tablet Take 10 mg by mouth daily.    Marland Kitchen gabapentin (NEURONTIN) 100 MG capsule Take 300 mg by mouth at bedtime.    Marland Kitchen glucose blood (ONETOUCH VERIO) test strip Use as instructed 4 x daily 150 each 5  . insulin aspart (NOVOLOG FLEXPEN) 100 UNIT/ML FlexPen Inject 10-16 Units into the skin 3 (three) times daily with meals. 15 mL 3  . omeprazole (PRILOSEC) 20 MG capsule Take 20 mg by mouth 2 (two) times daily before a meal.    . Insulin Detemir (LEVEMIR FLEXTOUCH) 100 UNIT/ML Pen Inject 50 Units into the skin at bedtime. 15 mL 2   No current facility-administered medications for this visit.     Review of Systems  Constitutional: Positive for malaise/fatigue.       Hot flashes  HENT: Negative.   Eyes: Negative.   Respiratory: Negative.  Negative for shortness of breath.   Cardiovascular: Negative.  Negative for chest pain.  Gastrointestinal: Negative.  Negative for abdominal pain.       Hiatal hernia  Genitourinary: Negative.   Musculoskeletal: Negative.  Negative for joint pain.  Skin: Negative.   Neurological: Positive for headaches.  Endo/Heme/Allergies: Negative.   Psychiatric/Behavioral: Negative.   All other systems reviewed and are negative. 14 point ROS was done and is otherwise as  detailed above or in HPI  PHYSICAL EXAMINATION: ECOG PERFORMANCE STATUS: 1 - Symptomatic but completely ambulatory  Vitals:   10/05/16 1310  BP: (!) 149/83  Pulse: 86  Resp: 20  Temp: 98.7 F (37.1 C)   Filed Weights   10/05/16 1310  Weight: 200 lb 4.8 oz (90.9 kg)   Physical Exam  Constitutional: She is oriented to person, place, and time and well-developed, well-nourished, and in no distress.  HENT:  Head: Normocephalic and atraumatic.  Eyes: EOM are normal. Pupils are equal, round, and reactive to light.  Neck: Normal range of motion. Neck supple.  Cardiovascular: Normal rate, regular rhythm and normal heart sounds.   Pulmonary/Chest:  Effort normal and breath sounds normal.  Abdominal: Soft. Bowel sounds are normal. There is tenderness (epigastric region). There is no rebound and no guarding.  Musculoskeletal: Normal range of motion.  Neurological: She is alert and oriented to person, place, and time. Gait normal.  Skin: Skin is warm and dry.  Nursing note and vitals reviewed.  LABORATORY DATA:  I have reviewed the data as listed  CBC No flowsheet data found.  CMP     Component Value Date/Time   BUN 19 09/25/2015   CREATININE 0.6 09/25/2015   AST 62 (A) 09/25/2015   ALT 66 (A) 09/25/2015   ALKPHOS 147 (A) 09/25/2015   Outside Labs 07/27/2016 WBC   19.2 (H) Neutrophils (Abs) 11.7 (H) Lymphs (Abs)   5.2 (H) Monocytes (Abs) 1.5 (H)  RBC    4.56 Hgb   11.6 Hematocrit  36.4 MCV   80 MCH   25.5 (L) MCHC   31.9 RDW   16.1 (H) Platelets  651 (H) Neutrophils  61 Lymphs  27 Monocytes  8 Eos   2 Basos   1  Outside Labs 07/20/2016  WBC   16.7 (H) Neutrophils (Abs) 9.5 (H) Lymphs (Abs)  5.3 (H) Monocytes (Abs) 1.3 (H)  RBC   4.63 Hgb   11.9 Hematocrit  37.3 MCV   81 MCH   25.7 (H) MCHC   31.9 RDW   16.2 (H) Platelets  741 (H) Neutrophils  56 Lymphs  32 Monocytes  8 Eos   3 Basos   0  Hematopath Smear 08/04/2016 Diagnosis: Neutrophilic leukocytosis and mild lymphocytosis. Cells are mature. Reactive changes are present.  Absolute monocytosis also present.   RADIOGRAPHIC STUDIES: I have personally reviewed the radiological images as listed and agreed with the findings in the report. No results found.  ASSESSMENT & PLAN:  Cancer Staging No matching staging information was found for the patient. No problem-specific Assessment & Plan notes found for this encounter. Leukocytosis  Thrombocytosis Mild normocytic anemia  PLAN: Plan to rule out chronic leukemias including CLL and CML, rule out myeloproliferative disorders. Labs today; CBC, CMP, Iron studies, Ferritin, JAK2 with reflex to exon  12, MPL, CALR, chronic flow cytometry.   She will return for follow up in 3 weeks to review labs and discuss the next plan of care.   All questions were answered. The patient knows to call the clinic with any problems, questions or concerns.  This document serves as a record of services personally performed by Twana First, MD. It was created on her behalf by Martinique Casey, a trained medical scribe. The creation of this record is based on the scribe's personal observations and the provider's statements to them. This document has been checked and approved by the attending provider.  I have reviewed the above documentation for accuracy and  completeness and I agree with the above.  This note was electronically signed.    Martinique M Casey  10/05/2016 1:12 PM

## 2016-10-06 DIAGNOSIS — E1142 Type 2 diabetes mellitus with diabetic polyneuropathy: Secondary | ICD-10-CM | POA: Diagnosis not present

## 2016-10-06 DIAGNOSIS — Z713 Dietary counseling and surveillance: Secondary | ICD-10-CM | POA: Diagnosis not present

## 2016-10-06 DIAGNOSIS — K219 Gastro-esophageal reflux disease without esophagitis: Secondary | ICD-10-CM | POA: Diagnosis not present

## 2016-10-06 DIAGNOSIS — G4700 Insomnia, unspecified: Secondary | ICD-10-CM | POA: Diagnosis not present

## 2016-10-06 DIAGNOSIS — Z299 Encounter for prophylactic measures, unspecified: Secondary | ICD-10-CM | POA: Diagnosis not present

## 2016-10-06 DIAGNOSIS — I10 Essential (primary) hypertension: Secondary | ICD-10-CM | POA: Diagnosis not present

## 2016-10-06 DIAGNOSIS — E7800 Pure hypercholesterolemia, unspecified: Secondary | ICD-10-CM | POA: Diagnosis not present

## 2016-10-06 DIAGNOSIS — Z6836 Body mass index (BMI) 36.0-36.9, adult: Secondary | ICD-10-CM | POA: Diagnosis not present

## 2016-10-12 LAB — BCR-ABL1, CML/ALL, PCR, QUANT

## 2016-10-13 LAB — CALR + JAK2 E12-15 + MPL (REFLEXED)

## 2016-10-13 LAB — JAK2 V617F, W REFLEX TO CALR/E12/MPL

## 2016-10-26 ENCOUNTER — Encounter (HOSPITAL_COMMUNITY): Payer: Medicare Other | Attending: Oncology | Admitting: Oncology

## 2016-10-26 ENCOUNTER — Encounter (HOSPITAL_COMMUNITY): Payer: Self-pay

## 2016-10-26 VITALS — BP 160/88 | HR 120 | Temp 98.4°F | Resp 20 | Wt 202.3 lb

## 2016-10-26 DIAGNOSIS — E119 Type 2 diabetes mellitus without complications: Secondary | ICD-10-CM | POA: Insufficient documentation

## 2016-10-26 DIAGNOSIS — Z811 Family history of alcohol abuse and dependence: Secondary | ICD-10-CM | POA: Insufficient documentation

## 2016-10-26 DIAGNOSIS — Z8249 Family history of ischemic heart disease and other diseases of the circulatory system: Secondary | ICD-10-CM | POA: Insufficient documentation

## 2016-10-26 DIAGNOSIS — Z809 Family history of malignant neoplasm, unspecified: Secondary | ICD-10-CM | POA: Insufficient documentation

## 2016-10-26 DIAGNOSIS — Z7984 Long term (current) use of oral hypoglycemic drugs: Secondary | ICD-10-CM | POA: Insufficient documentation

## 2016-10-26 DIAGNOSIS — Z7982 Long term (current) use of aspirin: Secondary | ICD-10-CM | POA: Insufficient documentation

## 2016-10-26 DIAGNOSIS — D72820 Lymphocytosis (symptomatic): Secondary | ICD-10-CM | POA: Insufficient documentation

## 2016-10-26 DIAGNOSIS — J449 Chronic obstructive pulmonary disease, unspecified: Secondary | ICD-10-CM | POA: Insufficient documentation

## 2016-10-26 DIAGNOSIS — D72821 Monocytosis (symptomatic): Secondary | ICD-10-CM | POA: Insufficient documentation

## 2016-10-26 DIAGNOSIS — D649 Anemia, unspecified: Secondary | ICD-10-CM | POA: Diagnosis not present

## 2016-10-26 DIAGNOSIS — K449 Diaphragmatic hernia without obstruction or gangrene: Secondary | ICD-10-CM | POA: Insufficient documentation

## 2016-10-26 DIAGNOSIS — K219 Gastro-esophageal reflux disease without esophagitis: Secondary | ICD-10-CM | POA: Insufficient documentation

## 2016-10-26 DIAGNOSIS — Z90710 Acquired absence of both cervix and uterus: Secondary | ICD-10-CM | POA: Insufficient documentation

## 2016-10-26 DIAGNOSIS — E559 Vitamin D deficiency, unspecified: Secondary | ICD-10-CM | POA: Insufficient documentation

## 2016-10-26 DIAGNOSIS — E785 Hyperlipidemia, unspecified: Secondary | ICD-10-CM | POA: Insufficient documentation

## 2016-10-26 DIAGNOSIS — I10 Essential (primary) hypertension: Secondary | ICD-10-CM | POA: Insufficient documentation

## 2016-10-26 DIAGNOSIS — D473 Essential (hemorrhagic) thrombocythemia: Secondary | ICD-10-CM

## 2016-10-26 DIAGNOSIS — F329 Major depressive disorder, single episode, unspecified: Secondary | ICD-10-CM | POA: Insufficient documentation

## 2016-10-26 DIAGNOSIS — Z79899 Other long term (current) drug therapy: Secondary | ICD-10-CM | POA: Insufficient documentation

## 2016-10-26 DIAGNOSIS — F419 Anxiety disorder, unspecified: Secondary | ICD-10-CM | POA: Insufficient documentation

## 2016-10-26 DIAGNOSIS — Z882 Allergy status to sulfonamides status: Secondary | ICD-10-CM | POA: Insufficient documentation

## 2016-10-26 DIAGNOSIS — D72829 Elevated white blood cell count, unspecified: Secondary | ICD-10-CM

## 2016-10-26 NOTE — Progress Notes (Signed)
[name]  PROGRESS NOTE  Patient Care Team: Monico Blitz, MD as PCP - General (Internal Medicine)  CHIEF COMPLAINTS/PURPOSE OF CONSULTATION:  Leukocytosis   HISTORY OF PRESENTING ILLNESS:  Carrie Turner 66 y.o. female is here for f/u of leukocytosis.   She is doing well today. She is still very tired and her muscles ache all over. For the past couple of weeks, she has been taking 2 iron pills a day. She wakes up at night with some nausea. Denies chest pain, SOB, abdominal pain, or any other concerns.   MEDICAL HISTORY:  Past Medical History:  Diagnosis Date  . Anxiety   . Depression   . Diabetes mellitus, type II ([place])   . GERD (gastroesophageal reflux disease)   . Hyperlipidemia   . Hypertension   . Migraines   . Neuropathy ([place])   . Vitamin D deficiency     SURGICAL HISTORY: Past Surgical History:  Procedure Laterality Date  . ABDOMINAL HYSTERECTOMY    . KNEE ARTHROSCOPY Right   . ORIF ANKLE FRACTURE Right     SOCIAL HISTORY: Social History   Social History  . Marital status: Single    Spouse name: N/A  . Number of children: N/A  . Years of education: N/A   Occupational History  . Not on file.   Social History Main Topics  . Smoking status: Never Smoker  . Smokeless tobacco: Never Used  . Alcohol use No  . Drug use: No  . Sexual activity: No   Other Topics Concern  . Not on file   Social History Narrative  . No narrative on file    FAMILY HISTORY: Family History  Problem Relation Age of Onset  . Hypertension Mother   . Colon cancer Mother   . Alcohol abuse Father   . Pancreatic cancer Sister   . Stroke Brother   . Cancer Maternal Grandmother   . Cancer Maternal Grandfather   . Cancer Paternal Grandmother   . Other Paternal Grandfather   . Other Brother     ALLERGIES:  is allergic to sulfa antibiotics.  MEDICATIONS:  Current Outpatient Prescriptions  Medication Sig Dispense Refill  . amLODipine (NORVASC) 2.5 MG  tablet Take 2.5 mg by mouth daily.    . Aspirin-Acetaminophen-Caffeine (EXCEDRIN MIGRAINE PO) Take by mouth.    . canagliflozin (INVOKANA) 100 MG TABS tablet Take 1 tablet (100 mg total) by mouth daily before breakfast. 30 tablet 2  . citalopram (CELEXA) 10 MG tablet Take 10 mg by mouth daily.    Marland Kitchen gabapentin (NEURONTIN) 100 MG capsule Take 300 mg by mouth at bedtime.    Marland Kitchen glucose blood (ONETOUCH VERIO) test strip Use as instructed 4 x daily 150 each 5  . insulin aspart (NOVOLOG FLEXPEN) 100 UNIT/ML FlexPen Inject 10-16 Units into the skin 3 (three) times daily with meals. 15 mL 3  . Insulin Detemir (LEVEMIR FLEXTOUCH) 100 UNIT/ML Pen Inject 50 Units into the skin at bedtime. 15 mL 2  . omeprazole (PRILOSEC) 20 MG capsule Take 20 mg by mouth 2 (two) times daily before a meal.     No current facility-administered medications for this visit.    Review of Systems  Constitutional: Positive for malaise/fatigue.  HENT: Negative.   Eyes: Negative.   Respiratory: Negative.  Negative for shortness of breath.   Cardiovascular: Negative.  Negative for chest pain.  Gastrointestinal: Positive for nausea. Negative for abdominal pain.  Genitourinary: Negative.   Musculoskeletal: Positive for myalgias.  Skin: Negative.   Neurological: Negative.   Endo/Heme/Allergies: Negative.   Psychiatric/Behavioral: Negative.   All other systems reviewed and are negative. 14 point ROS was done and is otherwise as detailed above or in HPI  PHYSICAL EXAMINATION: ECOG PERFORMANCE STATUS: 1 - Symptomatic but completely ambulatory  Vitals:   10/26/16 1526  BP: (!) 160/88  Pulse: (!) 120  Resp: 20  Temp: 98.4 F (36.9 C)   Filed Weights   10/26/16 1526  Weight: 202 lb 4.8 oz (91.8 kg)   Physical Exam  Constitutional: She is oriented to person, place, and time and well-developed, well-nourished, and in no distress.  HENT:  Head: Normocephalic and atraumatic.  Eyes: EOM are normal. Pupils are equal, round,  and reactive to light.  Neck: Normal range of motion. Neck supple.  Cardiovascular: Normal rate, regular rhythm and normal heart sounds.   Pulmonary/Chest: Effort normal and breath sounds normal.  Abdominal: Soft. Bowel sounds are normal. She exhibits no distension. There is no tenderness. There is no rebound and no guarding.  Musculoskeletal: Normal range of motion.  Neurological: She is alert and oriented to person, place, and time. Gait normal.  Skin: Skin is warm and dry.  Nursing note and vitals reviewed.  LABORATORY DATA:  I have reviewed the data as listed  CBC CBC Latest Ref Rng & Units 10/05/2016  WBC 4.0 - 10.5 K/uL 13.4(H)  Hemoglobin 12.0 - 15.0 g/dL 12.3  Hematocrit 36.0 - 46.0 % 38.9  Platelets 150 - 400 K/uL 494(H)   CMP     Component Value Date/Time   NA 132 (L) 10/05/2016 1332   K 3.6 10/05/2016 1332   CL 99 (L) 10/05/2016 1332   CO2 25 10/05/2016 1332   GLUCOSE 156 (H) 10/05/2016 1332   BUN 20 10/05/2016 1332   BUN 19 09/25/2015   CREATININE 0.50 10/05/2016 1332   CALCIUM 8.9 10/05/2016 1332   PROT 7.7 10/05/2016 1332   ALBUMIN 4.0 10/05/2016 1332   AST 17 10/05/2016 1332   ALT 17 10/05/2016 1332   ALKPHOS 131 (H) 10/05/2016 1332   BILITOT 0.3 10/05/2016 1332   GFRNONAA >60 10/05/2016 1332   GFRAA >60 10/05/2016 1332   Outside Labs 07/27/2016 WBC   19.2 (H) Neutrophils (Abs) 11.7 (H) Lymphs (Abs)   5.2 (H) Monocytes (Abs) 1.5 (H)  RBC    4.56 Hgb   11.6 Hematocrit  36.4 MCV   80 MCH   25.5 (L) MCHC   31.9 RDW   16.1 (H) Platelets  651 (H) Neutrophils  61 Lymphs  27 Monocytes  8 Eos   2 Basos   1  Outside Labs 07/20/2016  WBC   16.7 (H) Neutrophils (Abs) 9.5 (H) Lymphs (Abs)  5.3 (H) Monocytes (Abs) 1.3 (H)  RBC   4.63 Hgb   11.9 Hematocrit  37.3 MCV   81 MCH   25.7 (H) MCHC   31.9 RDW   16.2 (H) Platelets  741 (H) Neutrophils  56 Lymphs  32 Monocytes  8 Eos   3 Basos   0  Hematopath Smear  08/04/2016 Diagnosis: Neutrophilic leukocytosis and mild lymphocytosis. Cells are mature. Reactive changes are present.  Absolute monocytosis also present.   PATHOLOGY:  BCR-ABL1, CML/ALL, PCR, QUANT  Order: [id number]   Status:  Edited Result - FINAL Visible to patient:  No (Not Released) Next appt:  11/28/2016 at 11:30 AM in Endocrinology Glade Lloyd, MD) Dx:  Thrombocytosis ([place]); Anemia, unspeci...   Ref Range & Units 3wk ago  b2a2 transcript % Comment   Comments: (NOTE)       <0.0032 %  (sensitivity limit of assay)   b3a2 transcript % Comment   Comments: (NOTE)       <0.0032 %  (sensitivity limit of assay)   E1A2 Transcript % Comment   Comments: (NOTE)       <0.0032 %  (sensitivity limit of assay)   Interpretation (BCRAL):  Comment   Comments: (NOTE)  NEGATIVE for the BCR-ABL1 e1a2 (p190), e13a2 (b2a2, p210) and e14a2  (b3a2, p210) fusion transcripts. These results do not rule out the  presence of rare BCR-ABL1 transcripts not detected by this assay.        JAK2 V617, CALR + JAK2 E12-15 + MPL (reflexed) -negative. Results for SHONTERIA, ABELN (MRN [id number]) as of 10/26/2016 15:23  Ref. Range 10/05/2016 13:32  Iron Latest Ref Range: 28 - 170 ug/dL 57  UIBC Latest Units: ug/dL 390  TIBC Latest Ref Range: 250 - 450 ug/dL 447  Saturation Ratios Latest Ref Range: 10.4 - 31.8 % 13  Ferritin Latest Ref Range: 11 - 307 ng/mL 18    ASSESSMENT & PLAN:  Leukocytosis -improved Thrombocytosis-improved Mild normocytic anemia  Reviewed workup with the patient and her daughter today. Workup negative for myeloproliferative disorder and chronic leukemias including CML/CLL.  Her thrombocytosis has improved but may be reactive to iron deficiency. I recommend for her to start taking ferrous sulfate 325mg  PO BID with meals and to take a stool softener if constipation develops while on iron supplementation. I recommended for patient to see a rheumatologist for her diffuse  myalgia to rule out fibromyalgia as a cause.  She will return for follow up in 3 months with labs; iron studies and CBC.   Orders Placed This Encounter  Procedures  . CBC with Differential    Standing Status:   Future    Standing Expiration Date:   02/25/2017  . Iron and TIBC    Standing Status:   Future    Standing Expiration Date:   02/25/2017  . Ferritin    Standing Status:   Future    Standing Expiration Date:   02/25/2017    All questions were answered. The patient knows to call the clinic with any problems, questions or concerns.  This document serves as a record of services personally performed by Twana First, MD. It was created on her behalf by Martinique Casey, a trained medical scribe. The creation of this record is based on the scribe's personal observations and the provider's statements to them. This document has been checked and approved by the attending provider.  I have reviewed the above documentation for accuracy and completeness and I agree with the above.  This note was electronically signed.    Martinique M Casey  10/26/2016 3:29 PM

## 2016-10-26 NOTE — Patient Instructions (Signed)
[facility] Discharge Instructions  RECOMMENDATIONS MADE BY THE CONSULTANT AND ANY TEST RESULTS WILL BE SENT TO YOUR REFERRING PHYSICIAN.  You were seen today by Dr. Louise Zhou Follow up in 3 months with lab work See Amy up front for appointments   Thank you for choosing [facility] at [facility] to provide your oncology and hematology care.  To afford each patient quality time with our provider, please arrive at least 15 minutes before your scheduled appointment time.    If you have a lab appointment with the Cancer Center please come in thru the  Main Entrance and check in at the main information desk  You need to re-schedule your appointment should you arrive 10 or more minutes late.  We strive to give you quality time with our providers, and arriving late affects you and other patients whose appointments are after yours.  Also, if you no show three or more times for appointments you may be dismissed from the clinic at the providers discretion.     Again, thank you for choosing [facility].  Our hope is that these requests will decrease the amount of time that you wait before being seen by our physicians.       _____________________________________________________________  Should you have questions after your visit to [facility], please contact our office at [phone] between the hours of 8:30 a.m. and 4:30 p.m.  Voicemails left after 4:30 p.m. will not be returned until the following business day.  For prescription refill requests, have your pharmacy contact our office.       Resources For Cancer Patients and their Caregivers ? American Cancer Society: Can assist with transportation, wigs, general needs, runs Look Good Feel Better.        1-888-227-6333 ? Cancer Care: Provides financial assistance, online support groups, medication/co-pay assistance.  1-800-813-HOPE (4673) ? [facility] Assists [place] cancer patients and their families through emotional , educational and financial support.  336-427-4357 ? [place] DSS Where to apply for food stamps, Medicaid and utility assistance. 336-342-1394 ? [facility]: Transportation to medical appointments. 336-347-2287 ? Social Security Administration: May apply for disability if have a Stage IV cancer. 336-342-7796 1-800-772-1213 ? [place] Aging, Disability and Transit Services: Assists with nutrition, care and transit needs. 336-349-2343  Cancer Center Support Programs: @10RELATIVEDAYS@ > Cancer Support Group  2nd Tuesday of the month 1pm-2pm, Journey Room  > Creative Journey  3rd Tuesday of the month 1130am-1pm, Journey Room  > Look Good Feel Better  1st Wednesday of the month 10am-12 noon, Journey Room (Call American Cancer Society to register 1-800-395-5775)    

## 2016-11-14 ENCOUNTER — Other Ambulatory Visit: Payer: Self-pay | Admitting: "Endocrinology

## 2016-11-28 ENCOUNTER — Encounter: Payer: 59 | Attending: Internal Medicine | Admitting: Nutrition

## 2016-11-28 ENCOUNTER — Encounter: Payer: Self-pay | Admitting: "Endocrinology

## 2016-11-28 ENCOUNTER — Other Ambulatory Visit: Payer: Self-pay | Admitting: "Endocrinology

## 2016-11-28 ENCOUNTER — Ambulatory Visit (INDEPENDENT_AMBULATORY_CARE_PROVIDER_SITE_OTHER): Payer: Medicare Other | Admitting: "Endocrinology

## 2016-11-28 VITALS — Wt 203.0 lb

## 2016-11-28 VITALS — BP 122/82 | HR 105 | Ht 62.0 in | Wt 203.0 lb

## 2016-11-28 DIAGNOSIS — E1165 Type 2 diabetes mellitus with hyperglycemia: Secondary | ICD-10-CM | POA: Diagnosis not present

## 2016-11-28 DIAGNOSIS — E118 Type 2 diabetes mellitus with unspecified complications: Principal | ICD-10-CM

## 2016-11-28 DIAGNOSIS — Z6837 Body mass index (BMI) 37.0-37.9, adult: Secondary | ICD-10-CM

## 2016-11-28 DIAGNOSIS — Z794 Long term (current) use of insulin: Secondary | ICD-10-CM | POA: Diagnosis not present

## 2016-11-28 DIAGNOSIS — E782 Mixed hyperlipidemia: Secondary | ICD-10-CM

## 2016-11-28 DIAGNOSIS — Z713 Dietary counseling and surveillance: Secondary | ICD-10-CM | POA: Insufficient documentation

## 2016-11-28 DIAGNOSIS — Z6834 Body mass index (BMI) 34.0-34.9, adult: Secondary | ICD-10-CM

## 2016-11-28 DIAGNOSIS — IMO0002 Reserved for concepts with insufficient information to code with codable children: Secondary | ICD-10-CM

## 2016-11-28 DIAGNOSIS — E6609 Other obesity due to excess calories: Secondary | ICD-10-CM

## 2016-11-28 DIAGNOSIS — I10 Essential (primary) hypertension: Secondary | ICD-10-CM

## 2016-11-28 DIAGNOSIS — E66811 Other obesity due to excess calories: Secondary | ICD-10-CM

## 2016-11-28 MED ORDER — INSULIN DETEMIR 100 UNIT/ML FLEXPEN: 60.0000 [IU] | PEN_INJECTOR | Freq: Every day | SUBCUTANEOUS | 2 refills | Status: DC

## 2016-11-28 MED ORDER — INSULIN ASPART 100 UNIT/ML FLEXPEN: 15.0000 [IU] | PEN_INJECTOR | Freq: Three times a day (TID) | SUBCUTANEOUS | 3 refills | Status: DC

## 2016-11-28 NOTE — Progress Notes (Signed)
Subjective:    Patient ID: Carrie Turner, female    DOB: 13-Mar-1951. Patient is being seen in f/u for management of diabetes requested by  Monico Blitz, MD  Past Medical History:  Diagnosis Date  . Anxiety   . Depression   . Diabetes mellitus, type II ([place])   . GERD (gastroesophageal reflux disease)   . Hyperlipidemia   . Hypertension   . Migraines   . Neuropathy   . Vitamin D deficiency    Past Surgical History:  Procedure Laterality Date  . ABDOMINAL HYSTERECTOMY    . KNEE ARTHROSCOPY Right   . ORIF ANKLE FRACTURE Right    Social History   Social History  . Marital status: Single    Spouse name: N/A  . Number of children: N/A  . Years of education: N/A   Social History Main Topics  . Smoking status: Never Smoker  . Smokeless tobacco: Never Used  . Alcohol use No  . Drug use: No  . Sexual activity: No   Other Topics Concern  . None   Social History Narrative  . None   Outpatient Encounter Prescriptions as of 11/28/2016  Medication Sig  . amLODipine (NORVASC) 2.5 MG tablet Take 2.5 mg by mouth daily.  . Aspirin-Acetaminophen-Caffeine (EXCEDRIN MIGRAINE PO) Take by mouth.  . citalopram (CELEXA) 10 MG tablet Take 10 mg by mouth daily.  Marland Kitchen gabapentin (NEURONTIN) 100 MG capsule Take 300 mg by mouth at bedtime.  Marland Kitchen glucose blood (ONETOUCH VERIO) test strip Use as instructed 4 x daily  . insulin aspart (NOVOLOG FLEXPEN) 100 UNIT/ML FlexPen Inject 15-21 Units into the skin 3 (three) times daily with meals.  . Insulin Detemir (LEVEMIR FLEXTOUCH) 100 UNIT/ML Pen Inject 60 Units into the skin at bedtime.  . INVOKANA 100 MG TABS tablet TAKE ONE TABLET BY MOUTH ONCE DAILY BEFORE  BREAKFAST  . omeprazole (PRILOSEC) 20 MG capsule Take 20 mg by mouth 2 (two) times daily before a meal.  . [DISCONTINUED] insulin aspart (NOVOLOG FLEXPEN) 100 UNIT/ML FlexPen Inject 10-16 Units into the skin 3 (three) times daily with meals.  . [DISCONTINUED] Insulin Detemir (LEVEMIR FLEXTOUCH)  100 UNIT/ML Pen Inject 50 Units into the skin at bedtime.   No facility-administered encounter medications on file as of 11/28/2016.    ALLERGIES: Allergies  Allergen Reactions  . Sulfa Antibiotics    VACCINATION STATUS:  There is no immunization history on file for this patient.  Diabetes  She presents for her follow-up diabetic visit. She has type 2 diabetes mellitus. Onset time: She was diagnosed at approximate age of 15 years. Her disease course has been worsening. There are no hypoglycemic associated symptoms. Pertinent negatives for hypoglycemia include no confusion, headaches, pallor or seizures. Associated symptoms include blurred vision, fatigue, polydipsia and polyuria. Pertinent negatives for diabetes include no chest pain and no polyphagia. There are no hypoglycemic complications. Symptoms are worsening. There are no diabetic complications. Risk factors for coronary artery disease include diabetes mellitus, dyslipidemia, obesity and sedentary lifestyle. Current diabetic treatment includes insulin injections (She did not freeze her insulin dose as recommended during her last visit.). She is compliant with treatment some of the time. Her weight is stable. She is following a generally unhealthy diet. When asked about meal planning, she reported none. She has not had a previous visit with a dietitian (She will see the dietitian today.). She never participates in exercise. Her home blood glucose trend is decreasing steadily. Her breakfast blood glucose range is  generally >200 mg/dl. Her lunch blood glucose range is generally >200 mg/dl. Her dinner blood glucose range is generally >200 mg/dl. Her overall blood glucose range is >200 mg/dl. An ACE inhibitor/angiotensin II receptor blocker is not being taken. Eye exam is current.  Hyperlipidemia  This is a chronic problem. The current episode started more than 1 year ago. Recent lipid tests were reviewed and are high. Exacerbating diseases include  diabetes and obesity. Pertinent negatives include no chest pain, myalgias or shortness of breath. She is currently on no antihyperlipidemic treatment. Compliance problems include adherence to diet and adherence to exercise.  Risk factors for coronary artery disease include diabetes mellitus, dyslipidemia, hypertension, obesity and a sedentary lifestyle.  Hypertension  This is a chronic problem. The current episode started more than 1 year ago. The problem is controlled. Associated symptoms include blurred vision. Pertinent negatives include no chest pain, headaches, palpitations or shortness of breath. Risk factors for coronary artery disease include diabetes mellitus, dyslipidemia and sedentary lifestyle. Past treatments include calcium channel blockers. Compliance problems include diet and exercise.      Review of Systems  Constitutional: Positive for fatigue. Negative for unexpected weight change.  HENT: Negative for trouble swallowing and voice change.   Eyes: Positive for blurred vision. Negative for visual disturbance.  Respiratory: Negative for cough, shortness of breath and wheezing.   Cardiovascular: Negative for chest pain, palpitations and leg swelling.  Gastrointestinal: Negative for diarrhea, nausea and vomiting.  Endocrine: Positive for polydipsia and polyuria. Negative for cold intolerance, heat intolerance and polyphagia.  Genitourinary: Positive for frequency. Negative for dysuria and flank pain.  Musculoskeletal: Negative for arthralgias and myalgias.  Skin: Negative for color change, pallor, rash and wound.  Neurological: Negative for seizures and headaches.  Psychiatric/Behavioral: Negative for confusion and suicidal ideas.    Objective:    BP 122/82   Pulse (!) 105   Ht 5\' 2"  (1.575 m)   Wt 203 lb (92.1 kg)   BMI 37.13 kg/m   Wt Readings from Last 3 Encounters:  11/28/16 203 lb (92.1 kg)  10/26/16 202 lb 4.8 oz (91.8 kg)  10/05/16 200 lb 4.8 oz (90.9 kg)     Physical Exam  Constitutional: She is oriented to person, place, and time. She appears well-developed.  HENT:  Head: Normocephalic and atraumatic.  Eyes: EOM are normal.  Neck: Normal range of motion. Neck supple. No tracheal deviation present. No thyromegaly present.  Cardiovascular: Normal rate and regular rhythm.   Pulmonary/Chest: Effort normal and breath sounds normal.  Abdominal: Soft. Bowel sounds are normal. There is no tenderness. There is no guarding.  Musculoskeletal: Normal range of motion. She exhibits no edema.  Neurological: She is alert and oriented to person, place, and time. She has normal reflexes. No cranial nerve deficit. Coordination normal.  Skin: Skin is warm and dry. No rash noted. No erythema. No pallor.  Psychiatric: She has a normal mood and affect. Judgment normal.   Labs: 08/08/2016 show A1c of 9.6%. 04/20/2016: Her A1c was 11.4% A1c was 10.3% on 01/12/2016,  vitamin D low at 20.8 on 09/25/2015, liver function test was abnormal with alkaline phosphatase 147 (normal 39-1 17), AST 62, ALT 66,   Assessment & Plan:   1. Uncontrolled type 2 diabetes mellitus with complication, with long-term current use of insulin ([place])  - Patient has currently uncontrolled symptomatic type 2 DM since  66 years of age. - Her most recent labs did not include A1c, during her last visit A1c was  9.6% , improving from 11.4%. -Recent labs reviewed, showing normal renal function, transaminases back to normal.   Her diabetes is complicated by obesity and sedentary life and patient remains at a high risk for more acute and chronic complications of diabetes which include CAD, CVA, CKD, retinopathy, and neuropathy. These are all discussed in detail with the patient.  - I have counseled the patient on diet management and weight loss, by adopting a carbohydrate restricted/protein rich diet.  - Suggestion is made for patient to avoid simple carbohydrates   from their diet including Cakes  , Desserts, Ice Cream,  Soda (  diet and regular) , Sweet Tea , Candies,  Chips, Cookies, Artificial Sweeteners,   and "Sugar-free" Products . This will help patient to have stable blood glucose profile and potentially avoid unintended weight gain.  - I encouraged the patient to switch to  unprocessed or minimally processed complex starch and increased protein intake (animal or plant source), fruits, and vegetables.  - Patient is advised to stick to a routine mealtimes to eat 3 meals  a day and avoid unnecessary snacks ( to snack only to correct hypoglycemia).  - The patient will be scheduled with Jearld Fenton, RDN, CDE for individualized DM education.  - I have approached patient with the following individualized plan to manage diabetes and patient agrees:   -She will continue to require basal/bolus insulin. - I advised her to increase her Levemir to 60 units daily at bedtime, continue prandial insulin NovoLog  15 units TIDAC for pre-meal BG readings of 90-150mg /dl, plus patient specific correction dose for unexpected hyperglycemia above 150mg /dl, associated with strict monitoring of glucose  AC and HS. - Patient is warned not to take insulin without proper monitoring per orders. -Adjustment parameters are given for hypo and hyperglycemia in writing. -Patient is encouraged to call clinic for blood glucose levels less than 70 or above 300 mg /dl. - I will continue Invokana  100 mg by mouth every morning with breakfast, therapeutically suitable for patient- side effects and precautions discussed with her. -  Her liver function tests is improving, I advised her to stay off of metformin for now.  - Patient will be considered for incretin therapy as appropriate next visit. - Patient specific target  A1c;  LDL, HDL, Triglycerides, and  Waist Circumference were discussed in detail.  2) BP/HTN: uncontrolled.   He says she did not take her blood pressure medications today. She is advised to take her  blood pressure medications always in the morning. She will be considered for ACE inhibitor/ARB next visit.   3) Lipids/HPL:  Control unknown, I would obtain fasting lipid panel on subsequent visits. She is not on statins likely due to a concern of elevated transaminases. -However she will be reassessed for utility of statins after her next labs. 4)  obesity: CDE Consult in progress , exercise, and detailed carbohydrates information provided.  5) Chronic Care/Health Maintenance:  -Patient is encouraged to continue to follow up with Ophthalmology, Podiatrist at least yearly or according to recommendations, and advised to   stay away from smoking. I have recommended yearly flu vaccine and pneumonia vaccination at least every 5 years; moderate intensity exercise for up to 150 minutes weekly; and  sleep for at least 7 hours a day.  - 25 minutes of time was spent on the care of this patient , 50% of which was applied for counseling on diabetes complications and their preventions.  - Patient to bring meter and  blood glucose logs during her next visit.  - I advised patient to maintain close follow up with Monico Blitz, MD for primary care needs.  Follow up plan: - Return in about 3 months (around 02/28/2017) for follow up with pre-visit labs, meter, and logs.  Glade Lloyd, MD Phone: [phone]  Fax: [phone]   11/28/2016, 11:35 AM

## 2016-11-28 NOTE — Progress Notes (Signed)
Diabetes Self-Management Education  Visit Type:     Appt. Start Time:1145  Appt. End Time: 1200 11/28/2016   [name], identified by name and date of birth, is a 66 y.o. female with a diagnosis of Diabetes:  .  Saw Dr. Dorris Fetch today. A1C improved from 11% down to 9.6%. She is trying to get off diet cokes and exercise more. Feels much better. ASSESSMENT  Weight 203 lb (92.1 kg). Body mass index is 37.13 kg/m.  Wt Readings from Last 3 Encounters:  11/28/16 203 lb (92.1 kg)  11/28/16 203 lb (92.1 kg)  10/26/16 202 lb 4.8 oz (91.8 kg)   Ht Readings from Last 3 Encounters:  11/28/16 5\' 2"  (1.575 m)  10/05/16 5\' 2"  (1.575 m)  08/23/16 5' 2.5" (1.588 m)   Body mass index is 37.13 kg/m. @BMIFA @  CMP Latest Ref Rng & Units 10/05/2016 09/25/2015  Glucose 65 - 99 mg/dL 156(H) -  BUN 6 - 20 mg/dL 20 19  Creatinine 0.44 - 1.00 mg/dL 0.50 0.6  Sodium 135 - 145 mmol/L 132(L) -  Potassium 3.5 - 5.1 mmol/L 3.6 -  Chloride 101 - 111 mmol/L 99(L) -  CO2 22 - 32 mmol/L 25 -  Calcium 8.9 - 10.3 mg/dL 8.9 -  Total Protein 6.5 - 8.1 g/dL 7.7 -  Total Bilirubin 0.3 - 1.2 mg/dL 0.3 -  Alkaline Phos 38 - 126 U/L 131(H) 147(A)  AST 15 - 41 U/L 17 62(A)  ALT 14 - 54 U/L 17 66(A)           Diabetes Self-Management Education - 11/28/16 1145      Health Coping   How would you rate your overall health? Good     Pre-Education Assessment   Patient understands incorporating nutritional management into lifestyle. Needs Review   Patient undertands incorporating physical activity into lifestyle. Needs Review     Dietary Intake   Breakfast 1 Boiled egg and 1 cup oatmeal and 1 piece fruit, Diet coke   Lunch Green beans, and baked chicken and small poato, water   [employer] sandwich, [employer]) water, diet sodas.     Subsequent Visit   Since your last visit have you continued or begun to take your medications as prescribed? Yes   Since your last visit have you had your blood  pressure checked? Yes   Is your most recent blood pressure lower, unchanged, or higher since your last visit? Lower   Since your last visit have you experienced any weight changes? Gain   Weight Gain (lbs) 1   Since your last visit, are you checking your blood glucose at least once a day? Yes      Learning Objective:  Patient will have a greater understanding of diabetes self-management. Patient education plan is to attend individual and/or group sessions per assessed needs and concerns.   Plan:   Patient Instructions  Goals 1. Follow My Plate 2. Exercise 15 minutes twice a day 3. Eat three meals per day 4. Cut out snacks at night 5. Take insulin with meals- Lose 1 lb per week Get A1C to 7% in 6 months     Expected Outcomes:   Improved blood sugars, weight loss and reduced complications and improved knowledge.  Education material provided: Meal plan card and Carbohydrate counting sheet  If problems or questions, patient to contact team via:  Phone and Email  Future DSME appointment: -   3 months

## 2016-11-28 NOTE — Patient Instructions (Signed)
Goals 1. Follow My Plate 2. Exercise 15 minutes twice a day 3. Eat three meals per day 4. Cut out snacks at night 5. Take insulin with meals- Lose 1 lb per week Get A1C to 7% in 6 months

## 2016-11-28 NOTE — Patient Instructions (Signed)

## 2017-01-16 ENCOUNTER — Other Ambulatory Visit: Payer: Self-pay | Admitting: "Endocrinology

## 2017-01-26 ENCOUNTER — Other Ambulatory Visit (HOSPITAL_COMMUNITY): Payer: Medicare Other

## 2017-01-30 ENCOUNTER — Ambulatory Visit (HOSPITAL_COMMUNITY): Payer: Medicare Other

## 2017-02-07 ENCOUNTER — Encounter (HOSPITAL_COMMUNITY): Payer: Medicare Other | Attending: Oncology

## 2017-02-07 DIAGNOSIS — D72829 Elevated white blood cell count, unspecified: Secondary | ICD-10-CM | POA: Insufficient documentation

## 2017-02-07 DIAGNOSIS — D473 Essential (hemorrhagic) thrombocythemia: Secondary | ICD-10-CM | POA: Diagnosis not present

## 2017-02-07 DIAGNOSIS — D500 Iron deficiency anemia secondary to blood loss (chronic): Secondary | ICD-10-CM | POA: Insufficient documentation

## 2017-02-07 LAB — CBC WITH DIFFERENTIAL/PLATELET
BASOS PCT: 1 %
Basophils Absolute: 0.1 10*3/uL (ref 0.0–0.1)
Eosinophils Absolute: 0.3 10*3/uL (ref 0.0–0.7)
Eosinophils Relative: 2 %
HEMATOCRIT: 44.3 % (ref 36.0–46.0)
Hemoglobin: 13.9 g/dL (ref 12.0–15.0)
Lymphocytes Relative: 35 %
Lymphs Abs: 4.2 10*3/uL — ABNORMAL HIGH (ref 0.7–4.0)
MCH: 29.1 pg (ref 26.0–34.0)
MCHC: 31.4 g/dL (ref 30.0–36.0)
MCV: 92.9 fL (ref 78.0–100.0)
MONO ABS: 1 10*3/uL (ref 0.1–1.0)
MONOS PCT: 9 %
Neutro Abs: 6.3 10*3/uL (ref 1.7–7.7)
Neutrophils Relative %: 53 %
Platelets: 506 10*3/uL — ABNORMAL HIGH (ref 150–400)
RBC: 4.77 MIL/uL (ref 3.87–5.11)
RDW: 13.2 % (ref 11.5–15.5)
WBC: 11.8 10*3/uL — ABNORMAL HIGH (ref 4.0–10.5)

## 2017-02-07 LAB — IRON AND TIBC
Iron: 113 ug/dL (ref 28–170)
Saturation Ratios: 23 % (ref 10.4–31.8)
TIBC: 500 ug/dL — ABNORMAL HIGH (ref 250–450)
UIBC: 387 ug/dL

## 2017-02-07 LAB — FERRITIN: FERRITIN: 24 ng/mL (ref 11–307)

## 2017-02-09 ENCOUNTER — Encounter (HOSPITAL_COMMUNITY): Payer: Self-pay

## 2017-02-09 ENCOUNTER — Encounter (HOSPITAL_BASED_OUTPATIENT_CLINIC_OR_DEPARTMENT_OTHER): Payer: Medicare Other | Admitting: Oncology

## 2017-02-09 VITALS — BP 160/71 | HR 103 | Resp 16 | Ht 62.0 in | Wt 206.4 lb

## 2017-02-09 DIAGNOSIS — D72829 Elevated white blood cell count, unspecified: Secondary | ICD-10-CM | POA: Diagnosis not present

## 2017-02-09 DIAGNOSIS — D75839 Thrombocytosis, unspecified: Secondary | ICD-10-CM

## 2017-02-09 DIAGNOSIS — D473 Essential (hemorrhagic) thrombocythemia: Secondary | ICD-10-CM | POA: Diagnosis not present

## 2017-02-09 DIAGNOSIS — D500 Iron deficiency anemia secondary to blood loss (chronic): Secondary | ICD-10-CM

## 2017-02-09 NOTE — Progress Notes (Signed)
[facility]  PROGRESS NOTE  Patient Care Team: Monico Blitz, MD as PCP - General (Internal Medicine)  CHIEF COMPLAINTS/PURPOSE OF CONSULTATION:  Leukocytosis   HISTORY OF PRESENTING ILLNESS:  Carrie Turner 66 y.o. female is here for f/u of leukocytosis.   She is doing well today. She states she started working out a few weeks ago and is complaining of achy muscle pains in her legs. No recent infections. Denies chest pain, SOB, abdominal pain, or any other concerns.   MEDICAL HISTORY:  Past Medical History:  Diagnosis Date  . Anxiety   . Depression   . Diabetes mellitus, type II ([place])   . GERD (gastroesophageal reflux disease)   . Hyperlipidemia   . Hypertension   . Migraines   . Neuropathy   . Vitamin D deficiency     SURGICAL HISTORY: Past Surgical History:  Procedure Laterality Date  . ABDOMINAL HYSTERECTOMY    . KNEE ARTHROSCOPY Right   . ORIF ANKLE FRACTURE Right     SOCIAL HISTORY: Social History   Social History  . Marital status: Single    Spouse name: N/A  . Number of children: N/A  . Years of education: N/A   Occupational History  . Not on file.   Social History Main Topics  . Smoking status: Never Smoker  . Smokeless tobacco: Never Used  . Alcohol use No  . Drug use: No  . Sexual activity: No   Other Topics Concern  . Not on file   Social History Narrative  . No narrative on file    FAMILY HISTORY: Family History  Problem Relation Age of Onset  . Hypertension Mother   . Colon cancer Mother   . Alcohol abuse Father   . Pancreatic cancer Sister   . Stroke Brother   . Cancer Maternal Grandmother   . Cancer Maternal Grandfather   . Cancer Paternal Grandmother   . Other Paternal Grandfather   . Other Brother     ALLERGIES:  is allergic to sulfa antibiotics.  MEDICATIONS:  Current Outpatient Prescriptions  Medication Sig Dispense Refill  . amLODipine (NORVASC) 2.5 MG tablet Take 2.5 mg by mouth daily.    .  Aspirin-Acetaminophen-Caffeine (EXCEDRIN MIGRAINE PO) Take by mouth.    . citalopram (CELEXA) 10 MG tablet Take 10 mg by mouth daily.    Marland Kitchen gabapentin (NEURONTIN) 100 MG capsule Take 300 mg by mouth at bedtime.    Marland Kitchen glucose blood (ONETOUCH VERIO) test strip Use as instructed 4 x daily 150 each 5  . Insulin Detemir (LEVEMIR FLEXTOUCH) 100 UNIT/ML Pen Inject 60 Units into the skin at bedtime. 30 mL 2  . INVOKANA 100 MG TABS tablet TAKE ONE TABLET BY MOUTH ONCE DAILY BEFORE  BREAKFAST 30 tablet 2  . omeprazole (PRILOSEC) 20 MG capsule Take 20 mg by mouth 2 (two) times daily before a meal.     No current facility-administered medications for this visit.    Review of Systems  Constitutional: Negative for malaise/fatigue.  HENT: Negative.   Eyes: Negative.   Respiratory: Negative.  Negative for shortness of breath.   Cardiovascular: Negative.  Negative for chest pain.  Gastrointestinal: Negative for abdominal pain and nausea.  Genitourinary: Negative.   Musculoskeletal: Positive for myalgias (in her legs).  Skin: Negative.   Neurological: Negative.   Endo/Heme/Allergies: Negative.   Psychiatric/Behavioral: Negative.   All other systems reviewed and are negative. 14 point ROS was done and is otherwise as detailed above or  in HPI  PHYSICAL EXAMINATION: ECOG PERFORMANCE STATUS: 1 - Symptomatic but completely ambulatory  Vitals:   02/09/17 1449  BP: (!) 160/71  Pulse: (!) 103  Resp: 16   Filed Weights   02/09/17 1449  Weight: 206 lb 6.4 oz (93.6 kg)   Physical Exam  Constitutional: She is oriented to person, place, and time and well-developed, well-nourished, and in no distress.  HENT:  Head: Normocephalic and atraumatic.  Eyes: Pupils are equal, round, and reactive to light. EOM are normal.  Neck: Normal range of motion. Neck supple.  Cardiovascular: Normal rate, regular rhythm and normal heart sounds.   Pulmonary/Chest: Effort normal and breath sounds normal.  Abdominal: Soft.  Bowel sounds are normal. She exhibits no distension. There is no tenderness. There is no rebound and no guarding.  Musculoskeletal: Normal range of motion.  Neurological: She is alert and oriented to person, place, and time. Gait normal.  Skin: Skin is warm and dry.  Nursing note and vitals reviewed.  LABORATORY DATA:  I have reviewed the data as listed  CBC CBC Latest Ref Rng & Units 02/07/2017 10/05/2016  WBC 4.0 - 10.5 K/uL 11.8(H) 13.4(H)  Hemoglobin 12.0 - 15.0 g/dL 13.9 12.3  Hematocrit 36.0 - 46.0 % 44.3 38.9  Platelets 150 - 400 K/uL 506(H) 494(H)   CMP     Component Value Date/Time   NA 132 (L) 10/05/2016 1332   K 3.6 10/05/2016 1332   CL 99 (L) 10/05/2016 1332   CO2 25 10/05/2016 1332   GLUCOSE 156 (H) 10/05/2016 1332   BUN 20 10/05/2016 1332   BUN 19 09/25/2015   CREATININE 0.50 10/05/2016 1332   CALCIUM 8.9 10/05/2016 1332   PROT 7.7 10/05/2016 1332   ALBUMIN 4.0 10/05/2016 1332   AST 17 10/05/2016 1332   ALT 17 10/05/2016 1332   ALKPHOS 131 (H) 10/05/2016 1332   BILITOT 0.3 10/05/2016 1332   GFRNONAA >60 10/05/2016 1332   GFRAA >60 10/05/2016 1332   Outside Labs 07/27/2016 WBC   19.2 (H) Neutrophils (Abs) 11.7 (H) Lymphs (Abs)   5.2 (H) Monocytes (Abs) 1.5 (H)  RBC    4.56 Hgb   11.6 Hematocrit  36.4 MCV   80 MCH   25.5 (L) MCHC   31.9 RDW   16.1 (H) Platelets  651 (H) Neutrophils  61 Lymphs  27 Monocytes  8 Eos   2 Basos   1  Outside Labs 07/20/2016  WBC   16.7 (H) Neutrophils (Abs) 9.5 (H) Lymphs (Abs)  5.3 (H) Monocytes (Abs) 1.3 (H)  RBC   4.63 Hgb   11.9 Hematocrit  37.3 MCV   81 MCH   25.7 (H) MCHC   31.9 RDW   16.2 (H) Platelets  741 (H) Neutrophils  56 Lymphs  32 Monocytes  8 Eos   3 Basos   0  Hematopath Smear 08/04/2016 Diagnosis: Neutrophilic leukocytosis and mild lymphocytosis. Cells are mature. Reactive changes are present.  Absolute monocytosis also present.   PATHOLOGY:  BCR-ABL1, CML/ALL, PCR, QUANT  Order:  [id number]   Status:  Edited Result - FINAL Visible to patient:  No (Not Released) Next appt:  11/28/2016 at 11:30 AM in Endocrinology Glade Lloyd, MD) Dx:  Thrombocytosis ([place]); Anemia, unspeci...   Ref Range & Units 3wk ago  b2a2 transcript % Comment   Comments: (NOTE)       <0.0032 %  (sensitivity limit of assay)   b3a2 transcript % Comment   Comments: (NOTE)       <  0.0032 %  (sensitivity limit of assay)   E1A2 Transcript % Comment   Comments: (NOTE)       <0.0032 %  (sensitivity limit of assay)   Interpretation (BCRAL):  Comment   Comments: (NOTE)  NEGATIVE for the BCR-ABL1 e1a2 (p190), e13a2 (b2a2, p210) and e14a2  (b3a2, p210) fusion transcripts. These results do not rule out the  presence of rare BCR-ABL1 transcripts not detected by this assay.        JAK2 V617, CALR + JAK2 E12-15 + MPL (reflexed) -negative. Results for TALLULAH, HOSMAN (MRN [id number]) as of 10/26/2016 15:23  Ref. Range 10/05/2016 13:32  Iron Latest Ref Range: 28 - 170 ug/dL 57  UIBC Latest Units: ug/dL 390  TIBC Latest Ref Range: 250 - 450 ug/dL 447  Saturation Ratios Latest Ref Range: 10.4 - 31.8 % 13  Ferritin Latest Ref Range: 11 - 307 ng/mL 18    ASSESSMENT & PLAN:  Leukocytosis -improved Thrombocytosis-improved Mild normocytic anemia- resolved  Workup negative for myeloproliferative disorder and chronic leukemias including CML/CLL. Leukocytosis continues to trend down.  Patient is no longer anemia at this time. Her thrombocytosis may be reactive to iron deficiency since her ferritin is still low despite improved serum iron. I recommend for her to continue ferrous sulfate 325mg  PO BID with meals and to take a stool softener if constipation develops while on iron supplementation. I have told her her muscle aches may be due to starting her new exercise regimen, but I again recommended for patient to see a rheumatologist for her diffuse myalgia to rule out fibromyalgia as a cause.    She will return for follow up in 4 months with labs; iron studies and CBC.   Orders Placed This Encounter  Procedures  . CBC with Differential    Standing Status:   Future    Standing Expiration Date:   02/09/2018  . Comprehensive metabolic panel    Standing Status:   Future    Standing Expiration Date:   02/09/2018  . Iron and TIBC    Standing Status:   Future    Standing Expiration Date:   02/09/2018  . Ferritin    Standing Status:   Future    Standing Expiration Date:   02/09/2018    All questions were answered. The patient knows to call the clinic with any problems, questions or concerns.   This note was electronically signed.    Twana First, MD  02/09/2017 3:12 PM

## 2017-02-15 ENCOUNTER — Other Ambulatory Visit: Payer: Self-pay

## 2017-02-15 MED ORDER — INSULIN LISPRO 100 UNIT/ML (KWIKPEN): 15.0000 [IU] | PEN_INJECTOR | Freq: Three times a day (TID) | SUBCUTANEOUS | 2 refills | Status: DC

## 2017-02-22 LAB — HEMOGLOBIN A1C: HEMOGLOBIN A1C: 9.5

## 2017-02-27 ENCOUNTER — Other Ambulatory Visit: Payer: Self-pay | Admitting: "Endocrinology

## 2017-02-28 ENCOUNTER — Encounter: Payer: Medicaid Other | Attending: Internal Medicine | Admitting: Nutrition

## 2017-02-28 ENCOUNTER — Ambulatory Visit (INDEPENDENT_AMBULATORY_CARE_PROVIDER_SITE_OTHER): Payer: Medicare Other | Admitting: "Endocrinology

## 2017-02-28 ENCOUNTER — Encounter: Payer: Self-pay | Admitting: "Endocrinology

## 2017-02-28 VITALS — Ht 62.0 in | Wt 194.0 lb

## 2017-02-28 VITALS — BP 128/87 | HR 106 | Wt 194.0 lb

## 2017-02-28 DIAGNOSIS — Z6834 Body mass index (BMI) 34.0-34.9, adult: Secondary | ICD-10-CM | POA: Diagnosis not present

## 2017-02-28 DIAGNOSIS — E6609 Other obesity due to excess calories: Secondary | ICD-10-CM

## 2017-02-28 DIAGNOSIS — Z713 Dietary counseling and surveillance: Secondary | ICD-10-CM | POA: Diagnosis not present

## 2017-02-28 DIAGNOSIS — I10 Essential (primary) hypertension: Secondary | ICD-10-CM

## 2017-02-28 DIAGNOSIS — E118 Type 2 diabetes mellitus with unspecified complications: Secondary | ICD-10-CM | POA: Insufficient documentation

## 2017-02-28 DIAGNOSIS — E1165 Type 2 diabetes mellitus with hyperglycemia: Secondary | ICD-10-CM | POA: Insufficient documentation

## 2017-02-28 DIAGNOSIS — E782 Mixed hyperlipidemia: Secondary | ICD-10-CM

## 2017-02-28 DIAGNOSIS — IMO0002 Reserved for concepts with insufficient information to code with codable children: Secondary | ICD-10-CM

## 2017-02-28 DIAGNOSIS — Z794 Long term (current) use of insulin: Secondary | ICD-10-CM

## 2017-02-28 DIAGNOSIS — E669 Obesity, unspecified: Secondary | ICD-10-CM

## 2017-02-28 MED ORDER — INSULIN DETEMIR 100 UNIT/ML FLEXPEN: 70.0000 [IU] | PEN_INJECTOR | Freq: Every day | SUBCUTANEOUS | 2 refills | Status: DC

## 2017-02-28 NOTE — Patient Instructions (Signed)
Goals 1. Cut out drinks 2. Walk 8,000 steps five times week 3. . Eat 2-3 carb choices 4. Keep going to the [facility] Get A1C down to 7% Goal AM BS less 130 mg/dl bedtime less than 150 mg/dl. NO snacks between meals.

## 2017-02-28 NOTE — Patient Instructions (Signed)

## 2017-02-28 NOTE — Progress Notes (Signed)
Subjective:    Patient ID: Carrie Turner, female    DOB: 29-Mar-1951. Patient is being seen in f/u for management of diabetes requested by  Monico Blitz, MD  Past Medical History:  Diagnosis Date  . Anxiety   . Depression   . Diabetes mellitus, type II ([place])   . GERD (gastroesophageal reflux disease)   . Hyperlipidemia   . Hypertension   . Migraines   . Neuropathy   . Vitamin D deficiency    Past Surgical History:  Procedure Laterality Date  . ABDOMINAL HYSTERECTOMY    . KNEE ARTHROSCOPY Right   . ORIF ANKLE FRACTURE Right    Social History   Social History  . Marital status: Single    Spouse name: N/A  . Number of children: N/A  . Years of education: N/A   Social History Main Topics  . Smoking status: Never Smoker  . Smokeless tobacco: Never Used  . Alcohol use No  . Drug use: No  . Sexual activity: No   Other Topics Concern  . None   Social History Narrative  . None   Outpatient Encounter Prescriptions as of 02/28/2017  Medication Sig  . amLODipine (NORVASC) 2.5 MG tablet Take 2.5 mg by mouth daily.  . Aspirin-Acetaminophen-Caffeine (EXCEDRIN MIGRAINE PO) Take by mouth.  . citalopram (CELEXA) 10 MG tablet Take 10 mg by mouth daily.  Marland Kitchen gabapentin (NEURONTIN) 100 MG capsule Take 300 mg by mouth at bedtime.  Marland Kitchen glucose blood (ONETOUCH VERIO) test strip Use as instructed 4 x daily  . Insulin Detemir (LEVEMIR FLEXTOUCH) 100 UNIT/ML Pen Inject 70 Units into the skin at bedtime.  . insulin lispro (HUMALOG KWIKPEN) 100 UNIT/ML KiwkPen Inject 0.15-0.21 mLs (15-21 Units total) into the skin 3 (three) times daily.  . INVOKANA 100 MG TABS tablet TAKE 1 TABLET BY MOUTH ONCE DAILY BEFORE BREAKFAST  . omeprazole (PRILOSEC) 20 MG capsule Take 20 mg by mouth 2 (two) times daily before a meal.  . [DISCONTINUED] Insulin Detemir (LEVEMIR FLEXTOUCH) 100 UNIT/ML Pen Inject 60 Units into the skin at bedtime.   No facility-administered encounter medications on file as of  02/28/2017.    ALLERGIES: Allergies  Allergen Reactions  . Sulfa Antibiotics    VACCINATION STATUS:  There is no immunization history on file for this patient.  Diabetes  She presents for her follow-up diabetic visit. She has type 2 diabetes mellitus. Onset time: She was diagnosed at approximate age of 1 years. Her disease course has been stable. There are no hypoglycemic associated symptoms. Pertinent negatives for hypoglycemia include no confusion, headaches, pallor or seizures. Associated symptoms include fatigue. Pertinent negatives for diabetes include no blurred vision, no chest pain, no polydipsia, no polyphagia and no polyuria. There are no hypoglycemic complications. Symptoms are stable. There are no diabetic complications. Risk factors for coronary artery disease include diabetes mellitus, dyslipidemia, obesity and sedentary lifestyle. Current diabetic treatment includes insulin injections (She did not freeze her insulin dose as recommended during her last visit.). She is compliant with treatment some of the time. Her weight is decreasing steadily. She is following a generally unhealthy diet. When asked about meal planning, she reported none. She has not had a previous visit with a dietitian (She will see the dietitian today.). She never participates in exercise. Her home blood glucose trend is decreasing steadily. Her breakfast blood glucose range is generally >200 mg/dl. Her lunch blood glucose range is generally 180-200 mg/dl. Her dinner blood glucose range is generally  180-200 mg/dl. Her bedtime blood glucose range is generally 180-200 mg/dl. Her overall blood glucose range is 180-200 mg/dl. An ACE inhibitor/angiotensin II receptor blocker is not being taken. Eye exam is current.  Hyperlipidemia  This is a chronic problem. The current episode started more than 1 year ago. Recent lipid tests were reviewed and are high. Exacerbating diseases include diabetes and obesity. Pertinent negatives  include no chest pain, myalgias or shortness of breath. She is currently on no antihyperlipidemic treatment. Compliance problems include adherence to diet and adherence to exercise.  Risk factors for coronary artery disease include diabetes mellitus, dyslipidemia, hypertension, obesity and a sedentary lifestyle.  Hypertension  This is a chronic problem. The current episode started more than 1 year ago. The problem is controlled. Pertinent negatives include no blurred vision, chest pain, headaches, palpitations or shortness of breath. Risk factors for coronary artery disease include diabetes mellitus, dyslipidemia and sedentary lifestyle. Past treatments include calcium channel blockers. Compliance problems include diet and exercise.      Review of Systems  Constitutional: Positive for fatigue. Negative for unexpected weight change.  HENT: Negative for trouble swallowing and voice change.   Eyes: Negative for blurred vision and visual disturbance.  Respiratory: Negative for cough, shortness of breath and wheezing.   Cardiovascular: Negative for chest pain, palpitations and leg swelling.  Gastrointestinal: Negative for diarrhea, nausea and vomiting.  Endocrine: Negative for cold intolerance, heat intolerance, polydipsia, polyphagia and polyuria.  Genitourinary: Positive for frequency. Negative for dysuria and flank pain.  Musculoskeletal: Negative for arthralgias and myalgias.  Skin: Negative for color change, pallor, rash and wound.  Neurological: Negative for seizures and headaches.  Psychiatric/Behavioral: Negative for confusion and suicidal ideas.    Objective:    BP 128/87   Pulse (!) 106   Wt 194 lb (88 kg)   SpO2 95%   BMI 35.48 kg/m   Wt Readings from Last 3 Encounters:  02/28/17 194 lb (88 kg)  02/28/17 194 lb (88 kg)  02/09/17 206 lb 6.4 oz (93.6 kg)    Physical Exam  Constitutional: She is oriented to person, place, and time. She appears well-developed.  HENT:  Head:  Normocephalic and atraumatic.  Eyes: EOM are normal.  Neck: Normal range of motion. Neck supple. No tracheal deviation present. No thyromegaly present.  Cardiovascular: Normal rate and regular rhythm.   Pulmonary/Chest: Effort normal and breath sounds normal.  Abdominal: Soft. Bowel sounds are normal. There is no tenderness. There is no guarding.  Musculoskeletal: Normal range of motion. She exhibits no edema.  Neurological: She is alert and oriented to person, place, and time. She has normal reflexes. No cranial nerve deficit. Coordination normal.  Skin: Skin is warm and dry. No rash noted. No erythema. No pallor.  Psychiatric: She has a normal mood and affect. Judgment normal.   Labs: 02/22/2017 show A1c of 9.5% 08/08/2016 show A1c of 9.6%. 04/20/2016: Her A1c was 11.4% A1c was 10.3% on 01/12/2016,  vitamin D low at 20.8 on 09/25/2015, liver function test was abnormal with alkaline phosphatase 147 (normal 39-1 17), AST 62, ALT 66,   Assessment & Plan:   1. Uncontrolled type 2 diabetes mellitus with complication, with long-term current use of insulin ([place])  - Patient has currently uncontrolled symptomatic type 2 DM since  65 years of age. - Her most recent labs Show A1c of 9.5%, slowly improving from 11.4%.  -Recent labs reviewed, showing normal renal function, transaminases back to normal.   Her diabetes is complicated by  obesity and sedentary life and patient remains at a high risk for more acute and chronic complications of diabetes which include CAD, CVA, CKD, retinopathy, and neuropathy. These are all discussed in detail with the patient.  - I have counseled the patient on diet management and weight loss, by adopting a carbohydrate restricted/protein rich diet.  - Suggestion is made for patient to avoid simple carbohydrates  from her diet including Cakes , Desserts, Ice Cream,  Soda (  diet and regular) , Sweet Tea , Candies,  Chips, Cookies, Artificial Sweeteners,   and  "Sugar-free" Products . This will help patient to have stable blood glucose profile and potentially avoid unintended weight gain.  - I encouraged the patient to switch to  unprocessed or minimally processed complex starch and increased protein intake (animal or plant source), fruits, and vegetables.  - Patient is advised to stick to a routine mealtimes to eat 3 meals  a day and avoid unnecessary snacks ( to snack only to correct hypoglycemia).   - I have approached patient with the following individualized plan to manage diabetes and patient agrees:   -She will continue to require basal/bolus insulin. - I advised her to increase her Levemir to 70 units daily at bedtime, continue prandial insulin NovoLog  15 units TIDAC for pre-meal BG readings of 90-150mg /dl, plus patient specific correction dose for unexpected hyperglycemia above 150mg /dl, associated with strict monitoring of glucose  AC and HS. - Patient is warned not to take insulin without proper monitoring per orders. -Adjustment parameters are given for hypo and hyperglycemia in writing. -Patient is encouraged to call clinic for blood glucose levels less than 70 or above 300 mg /dl. - I will continue Invokana  100 mg by mouth every morning with breakfast, therapeutically suitable for patient- side effects and precautions discussed with her. -  Her liver function tests is improving, I advised her to stay off of metformin for now.  - Patient will be considered for incretin therapy as appropriate next visit. - Patient specific target  A1c;  LDL, HDL, Triglycerides, and  Waist Circumference were discussed in detail.  2) BP/HTN: controlled.   She is advised to take her blood pressure medications always in the morning. She will be considered for ACE inhibitor/ARB next visit.   3) Lipids/HPL:  Control unknown, I would obtain fasting lipid panel on subsequent visits. She is not on statins likely due to a concern of elevated  transaminases. -However she will be reassessed for utility of statins after her next labs. 4)  obesity: CDE Consult in progress , exercise, and detailed carbohydrates information provided.  5) Chronic Care/Health Maintenance:  -Patient is encouraged to continue to follow up with Ophthalmology, Podiatrist at least yearly or according to recommendations, and advised to   stay away from smoking. I have recommended yearly flu vaccine and pneumonia vaccination at least every 5 years; moderate intensity exercise for up to 150 minutes weekly; and  sleep for at least 7 hours a day.  - 20 minutes of time was spent on the care of this patient , 50% of which was applied for counseling on diabetes complications and their preventions.  - Patient to bring meter and  blood glucose logs during her next visit.  - I advised patient to maintain close follow up with Monico Blitz, MD for primary care needs.  Follow up plan: - Return in about 3 months (around 05/31/2017) for follow up with pre-visit labs, meter, and logs.  Glade Lloyd, MD  Phone: [phone]  Fax: [phone]   02/28/2017, 3:24 PM

## 2017-02-28 NOTE — Progress Notes (Signed)
Diabetes Self-Management Education  Visit Type:     Appt. Start Time:1145  Appt. End Time: 1200 02/28/2017   Carrie Turner Books, identified by name and date of birth, is a 66 y.o. female with a diagnosis of Diabetes:  Marland Kitchen Type 2.   Saw Dr. Dorris Fetch today. A1C improved from 11% down to 9.6%. She is trying to get off diet cokes and exercise more. Feels much better.    Changes made:   Cut out eating at night, joined about the [facility] and making better food choice.s FBS: 120-200 mg/d   Bedtime: less than 200.  She is taking her Invokana, Novolog 15+ SS with meals and Levemir was on 60 units and going to increase to 70 units a day now. . A1C 9.5%, slighly down from 9.6% previously.   ASSESSMENT  Height 5\' 2"  (1.575 m), weight 194 lb (88 kg). Body mass index is 35.48 kg/m.  Wt Readings from Last 3 Encounters:  02/28/17 194 lb (88 kg)  02/28/17 194 lb (88 kg)  02/09/17 206 lb 6.4 oz (93.6 kg)   Ht Readings from Last 3 Encounters:  02/28/17 5\' 2"  (1.575 m)  02/09/17 5\' 2"  (1.575 m)  11/28/16 5\' 2"  (1.575 m)   Body mass index is 35.48 kg/m. @BMIFA @  CMP Latest Ref Rng & Units 10/05/2016 09/25/2015  Glucose 65 - 99 mg/dL 156(H) -  BUN 6 - 20 mg/dL 20 19  Creatinine 0.44 - 1.00 mg/dL 0.50 0.6  Sodium 135 - 145 mmol/L 132(L) -  Potassium 3.5 - 5.1 mmol/L 3.6 -  Chloride 101 - 111 mmol/L 99(L) -  CO2 22 - 32 mmol/L 25 -  Calcium 8.9 - 10.3 mg/dL 8.9 -  Total Protein 6.5 - 8.1 g/dL 7.7 -  Total Bilirubin 0.3 - 1.2 mg/dL 0.3 -  Alkaline Phos 38 - 126 U/L 131(H) 147(A)  AST 15 - 41 U/L 17 62(A)  ALT 14 - 54 U/L 17 66(A)     Learning Objective:  Patient will have a greater understanding of diabetes self-management. Patient education plan is to attend individual and/or group sessions per assessed needs and concerns.  B) Oatmeal and 1 egg,  Or GLucerna L) Chicken, broccoli and tomatoes,  Water D) TV dinner: Smart Ones; chicken, stuffing, carrots, water Peach Dinking water.  Plan:    Goals 1. Cut out drinks 2. Walk 8,000 steps five times week 3. . Eat 2-3 carb choices 4. Keep going to the [place] Get A1C down to 7% Goal AM BS less 130 mg/dl bedtime less than 150 mg/dl. NO snacks between meals.   Expected Outcomes:   Improved blood sugars, weight loss and reduced complications and improved knowledge.  Education material provided: Meal plan card and Carbohydrate counting sheet  If problems or questions, patient to contact team via:  Phone and Email  Future DSME appointment: -   3 months

## 2017-03-15 ENCOUNTER — Telehealth: Payer: Self-pay

## 2017-03-15 NOTE — Telephone Encounter (Signed)
error 

## 2017-05-22 LAB — HEPATIC FUNCTION PANEL
ALT: 17 (ref 7–35)
AST: 15 (ref 13–35)

## 2017-05-22 LAB — BASIC METABOLIC PANEL
BUN: 16 (ref 4–21)
CREATININE: 0.6 (ref <=1.1)

## 2017-05-22 LAB — HEMOGLOBIN A1C: HEMOGLOBIN A1C: 9.5

## 2017-05-31 ENCOUNTER — Encounter: Payer: Medicare Other | Attending: "Endocrinology | Admitting: Nutrition

## 2017-05-31 ENCOUNTER — Encounter: Payer: Self-pay | Admitting: Nutrition

## 2017-05-31 ENCOUNTER — Encounter: Payer: Self-pay | Admitting: "Endocrinology

## 2017-05-31 ENCOUNTER — Ambulatory Visit (INDEPENDENT_AMBULATORY_CARE_PROVIDER_SITE_OTHER): Payer: Medicare Other | Admitting: "Endocrinology

## 2017-05-31 VITALS — BP 136/84 | HR 68 | Ht 62.0 in | Wt 207.0 lb

## 2017-05-31 VITALS — Wt 206.8 lb

## 2017-05-31 DIAGNOSIS — E1165 Type 2 diabetes mellitus with hyperglycemia: Secondary | ICD-10-CM | POA: Diagnosis not present

## 2017-05-31 DIAGNOSIS — E782 Mixed hyperlipidemia: Secondary | ICD-10-CM | POA: Diagnosis not present

## 2017-05-31 DIAGNOSIS — E118 Type 2 diabetes mellitus with unspecified complications: Secondary | ICD-10-CM

## 2017-05-31 DIAGNOSIS — E6609 Other obesity due to excess calories: Secondary | ICD-10-CM | POA: Diagnosis not present

## 2017-05-31 DIAGNOSIS — IMO0002 Reserved for concepts with insufficient information to code with codable children: Secondary | ICD-10-CM

## 2017-05-31 DIAGNOSIS — I10 Essential (primary) hypertension: Secondary | ICD-10-CM | POA: Diagnosis not present

## 2017-05-31 DIAGNOSIS — E669 Obesity, unspecified: Secondary | ICD-10-CM

## 2017-05-31 DIAGNOSIS — Z713 Dietary counseling and surveillance: Secondary | ICD-10-CM | POA: Diagnosis not present

## 2017-05-31 DIAGNOSIS — Z6834 Body mass index (BMI) 34.0-34.9, adult: Secondary | ICD-10-CM

## 2017-05-31 DIAGNOSIS — Z794 Long term (current) use of insulin: Secondary | ICD-10-CM | POA: Diagnosis not present

## 2017-05-31 MED ORDER — METFORMIN HCL 500 MG PO TABS: 500.0000 mg | ORAL_TABLET | Freq: Two times a day (BID) | ORAL | 2 refills | Status: DC

## 2017-05-31 NOTE — Patient Instructions (Signed)

## 2017-05-31 NOTE — Progress Notes (Signed)
Diabetes Self-Management Education  Visit Type:     Follow up. Appt. Start Time:1145  Appt. End Time: 1200 05/31/2017   Carrie Turner Books, identified by name and date of birth, is a 66 y.o. female with a diagnosis of Diabetes:  Marland Kitchen Type 2.   Saw Dr. Doretha Imus. No change in A1C, still 9.5.  Hasn't been testing as required. She is not compliant with medication management nor her diet. She is eating insulin without eating and not eating meals on schedule. Skipping meals.  She notes she doesn't sleep but 20 minutes at a time. Chronically tired. 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 Not sleeping much at all. Weight gain of 10 lbs lbs and she doesn't know why. She admits to drinking sodas and tea. Not engaged with diet changes.   Lab Results  Component Value Date   HGBA1C 9.5 02/22/2017    ASSESSMENT  Weight 206 lb 12.8 oz (93.8 kg). Body mass index is 37.82 kg/m.  Wt Readings from Last 3 Encounters:  05/31/17 206 lb 12.8 oz (93.8 kg)  05/31/17 207 lb (93.9 kg)  02/28/17 194 lb (88 kg)   Ht Readings from Last 3 Encounters:  05/31/17 5\' 2"  (1.575 m)  02/28/17 5\' 2"  (1.575 m)  02/09/17 5\' 2"  (1.575 m)   Body mass index is 37.82 kg/m. @BMIFA @  CMP Latest Ref Rng & Units 10/05/2016 09/25/2015  Glucose 65 - 99 mg/dL 156(H) -  BUN 6 - 20 mg/dL 20 19  Creatinine 0.44 - 1.00 mg/dL 0.50 0.6  Sodium 135 - 145 mmol/L 132(L) -  Potassium 3.5 - 5.1 mmol/L 3.6 -  Chloride 101 - 111 mmol/L 99(L) -  CO2 22 - 32 mmol/L 25 -  Calcium 8.9 - 10.3 mg/dL 8.9 -  Total Protein 6.5 - 8.1 g/dL 7.7 -  Total Bilirubin 0.3 - 1.2 mg/dL 0.3 -  Alkaline Phos 38 - 126 U/L 131(H) 147(A)  AST 15 - 41 U/L 17 62(A)  ALT 14 - 54 U/L 17 66(A)     Learning Objective:  Patient will have a greater understanding of diabetes self-management. Patient education plan is to attend individual and/or group sessions per assessed needs and concerns.  B)  Skipped. L)  Skipped D) TV dinner: Smart Ones; chicken,  stuffing, carrots, water Peach Dinking water.  Plan:    Goals Cut out sodas.  Eat three balanced meals at times discussed. Take insulin as prescribed with meals. Do not skip meals Talk to family MD about a sleep study. Exercise 30 minutes a day. Increase low carb vegetables. Lose 1-2 lbs per week Get A1C down to 8%  Expected Outcomes:   Improved blood sugars, weight loss and reduced complications and improved knowledge.  Education material provided: Meal plan card and Carbohydrate counting sheet  If problems or questions, patient to contact team via:  Phone and Email  Future DSME appointment: -   3 months

## 2017-05-31 NOTE — Patient Instructions (Signed)
Goals Cut out sodas.  Eat three balanced meals at times discussed. Take insulin as prescribed with meals. Do not skip meals Talk to family MD about a sleep study. Exercise 30 minutes a day. Increase low carb vegetables. Lose 1-2 lbs per week Get A1C down to 8%

## 2017-05-31 NOTE — Progress Notes (Signed)
Subjective:    Patient ID: Carrie Turner, female    DOB: 04-14-1951. Patient is being seen in f/u for management of diabetes requested by  Monico Blitz, MD  Past Medical History:  Diagnosis Date  . Anxiety   . Depression   . Diabetes mellitus, type II ([place])   . GERD (gastroesophageal reflux disease)   . Hyperlipidemia   . Hypertension   . Migraines   . Neuropathy   . Vitamin D deficiency    Past Surgical History:  Procedure Laterality Date  . ABDOMINAL HYSTERECTOMY    . KNEE ARTHROSCOPY Right   . ORIF ANKLE FRACTURE Right    Social History   Socioeconomic History  . Marital status: Single    Spouse name: None  . Number of children: None  . Years of education: None  . Highest education level: None  Social Needs  . Financial resource strain: None  . Food insecurity - worry: None  . Food insecurity - inability: None  . Transportation needs - medical: None  . Transportation needs - non-medical: None  Occupational History  . None  Tobacco Use  . Smoking status: Never Smoker  . Smokeless tobacco: Never Used  Substance and Sexual Activity  . Alcohol use: No  . Drug use: No  . Sexual activity: No  Other Topics Concern  . None  Social History Narrative  . None   Outpatient Encounter Medications as of 05/31/2017  Medication Sig  . amLODipine (NORVASC) 2.5 MG tablet Take 2.5 mg by mouth daily.  . Aspirin-Acetaminophen-Caffeine (EXCEDRIN MIGRAINE PO) Take by mouth.  . citalopram (CELEXA) 10 MG tablet Take 10 mg by mouth daily.  Marland Kitchen gabapentin (NEURONTIN) 100 MG capsule Take 300 mg by mouth at bedtime.  Marland Kitchen glucose blood (ONETOUCH VERIO) test strip Use as instructed 4 x daily  . Insulin Detemir (LEVEMIR FLEXTOUCH) 100 UNIT/ML Pen Inject 70 Units into the skin at bedtime.  . insulin lispro (HUMALOG KWIKPEN) 100 UNIT/ML KiwkPen Inject 0.15-0.21 mLs (15-21 Units total) into the skin 3 (three) times daily.  . metFORMIN (GLUCOPHAGE) 500 MG tablet Take 1 tablet (500 mg  total) 2 (two) times daily with a meal by mouth.  Marland Kitchen omeprazole (PRILOSEC) 20 MG capsule Take 20 mg by mouth 2 (two) times daily before a meal.  . [DISCONTINUED] INVOKANA 100 MG TABS tablet TAKE 1 TABLET BY MOUTH ONCE DAILY BEFORE BREAKFAST   No facility-administered encounter medications on file as of 05/31/2017.    ALLERGIES: Allergies  Allergen Reactions  . Sulfa Antibiotics    VACCINATION STATUS:  There is no immunization history on file for this patient.  Diabetes  She presents for her follow-up diabetic visit. She has type 2 diabetes mellitus. Onset time: She was diagnosed at approximate age of 18 years. Her disease course has been stable. There are no hypoglycemic associated symptoms. Pertinent negatives for hypoglycemia include no confusion, headaches, pallor or seizures. Associated symptoms include fatigue. Pertinent negatives for diabetes include no blurred vision, no chest pain, no polydipsia, no polyphagia and no polyuria. There are no hypoglycemic complications. Symptoms are stable. There are no diabetic complications. Risk factors for coronary artery disease include diabetes mellitus, dyslipidemia, obesity and sedentary lifestyle. Current diabetic treatment includes insulin injections (She did not freeze her insulin dose as recommended during her last visit.). She is compliant with treatment some of the time. Her weight is decreasing steadily. She is following a generally unhealthy diet. When asked about meal planning, she reported none.  She has not had a previous visit with a dietitian (She will see the dietitian today.). She never participates in exercise. Her home blood glucose trend is decreasing steadily. Her breakfast blood glucose range is generally >200 mg/dl. Her lunch blood glucose range is generally 180-200 mg/dl. Her dinner blood glucose range is generally 180-200 mg/dl. Her bedtime blood glucose range is generally 180-200 mg/dl. Her overall blood glucose range is 180-200  mg/dl. An ACE inhibitor/angiotensin II receptor blocker is not being taken. Eye exam is current.  Hyperlipidemia  This is a chronic problem. The current episode started more than 1 year ago. Recent lipid tests were reviewed and are high. Exacerbating diseases include diabetes and obesity. Pertinent negatives include no chest pain, myalgias or shortness of breath. She is currently on no antihyperlipidemic treatment. Compliance problems include adherence to diet and adherence to exercise.  Risk factors for coronary artery disease include diabetes mellitus, dyslipidemia, hypertension, obesity and a sedentary lifestyle.  Hypertension  This is a chronic problem. The current episode started more than 1 year ago. The problem is controlled. Pertinent negatives include no blurred vision, chest pain, headaches, palpitations or shortness of breath. Risk factors for coronary artery disease include diabetes mellitus, dyslipidemia and sedentary lifestyle. Past treatments include calcium channel blockers. Compliance problems include diet and exercise.      Review of Systems  Constitutional: Positive for fatigue. Negative for unexpected weight change.  HENT: Negative for trouble swallowing and voice change.   Eyes: Negative for blurred vision and visual disturbance.  Respiratory: Negative for cough, shortness of breath and wheezing.   Cardiovascular: Negative for chest pain, palpitations and leg swelling.  Gastrointestinal: Negative for diarrhea, nausea and vomiting.  Endocrine: Negative for cold intolerance, heat intolerance, polydipsia, polyphagia and polyuria.  Genitourinary: Positive for frequency. Negative for dysuria and flank pain.  Musculoskeletal: Negative for arthralgias and myalgias.  Skin: Negative for color change, pallor, rash and wound.  Neurological: Negative for seizures and headaches.  Psychiatric/Behavioral: Negative for confusion and suicidal ideas.    Objective:    BP 136/84 (BP  Location: Right Arm, Patient Position: Sitting, Cuff Size: Normal)   Pulse 68   Ht 5\' 2"  (1.575 m)   Wt 207 lb (93.9 kg)   BMI 37.86 kg/m   Wt Readings from Last 3 Encounters:  05/31/17 206 lb 12.8 oz (93.8 kg)  05/31/17 207 lb (93.9 kg)  02/28/17 194 lb (88 kg)    Physical Exam  Constitutional: She is oriented to person, place, and time. She appears well-developed.  HENT:  Head: Normocephalic and atraumatic.  Eyes: EOM are normal.  Neck: Normal range of motion. Neck supple. No tracheal deviation present. No thyromegaly present.  Cardiovascular: Normal rate and regular rhythm.  Pulmonary/Chest: Effort normal and breath sounds normal.  Abdominal: Soft. Bowel sounds are normal. There is no tenderness. There is no guarding.  Musculoskeletal: Normal range of motion. She exhibits no edema.  Neurological: She is alert and oriented to person, place, and time. She has normal reflexes. No cranial nerve deficit. Coordination normal.  Skin: Skin is warm and dry. No rash noted. No erythema. No pallor.  Psychiatric: She has a normal mood and affect. Judgment normal.   Labs: 05/22/2017 show A1c of 9.5% 02/22/2017 show A1c of 9.5% 08/08/2016 show A1c of 9.6%. 04/20/2016: Her A1c was 11.4% A1c was 10.3% on 01/12/2016,  vitamin D low at 20.8 on 09/25/2015, liver function test was abnormal with alkaline phosphatase 147 (normal 39-1 17), AST 62, ALT 66.  Labs from 05/22/2017 and also showing normal renal function, normal CMP including transaminases.  Recent Results (from the past 2160 hour(s))  Basic metabolic panel     Status: None   Collection Time: 05/22/17 12:00 AM  Result Value Ref Range   BUN 16 4 - 21   Creatinine 0.6 0.5 - 1.1  Hepatic function panel     Status: None   Collection Time: 05/22/17 12:00 AM  Result Value Ref Range   ALT 17 7 - 35   AST 15 13 - 35  Hemoglobin A1c     Status: None   Collection Time: 05/22/17 12:00 AM  Result Value Ref Range   Hemoglobin A1C 9.5       Assessment & Plan:   1. Uncontrolled type 2 diabetes mellitus with complication, with long-term current use of insulin ([place])  - Patient has currently uncontrolled symptomatic type 2 DM since  66 years of age. - Her most recent labs Show A1c of 9.5%, slowly improving from 11.4%.  -Recent labs reviewed, showing normal renal function, transaminases back to normal.   Her diabetes is complicated by obesity and sedentary life and patient remains at a high risk for more acute and chronic complications of diabetes which include CAD, CVA, CKD, retinopathy, and neuropathy. These are all discussed in detail with the patient.  - I have counseled the patient on diet management and weight loss, by adopting a carbohydrate restricted/protein rich diet. -  Suggestion is made for her to avoid simple carbohydrates  from her diet including Cakes, Sweet Desserts / Pastries, Ice Cream, Soda (diet and regular), Sweet Tea, Candies, Chips, Cookies, Store Bought Juices, Alcohol in Excess of  1-2 drinks a day, Artificial Sweeteners, and "Sugar-free" Products. This will help patient to have stable blood glucose profile and potentially avoid unintended weight gain.   - I encouraged the patient to switch to  unprocessed or minimally processed complex starch and increased protein intake (animal or plant source), fruits, and vegetables.  - Patient is advised to stick to a routine mealtimes to eat 3 meals  a day and avoid unnecessary snacks ( to snack only to correct hypoglycemia).   - I have approached patient with the following individualized plan to manage diabetes and patient agrees:   -She will continue to require basal/bolus insulin. - I advised her to  continue Levemir  70 units daily at bedtime, continue prandial insulin NovoLog  15 units TIDAC for pre-meal BG readings of 90-150mg /dl, plus patient specific correction dose for unexpected hyperglycemia above 150mg /dl, associated with strict monitoring of glucose   AC and HS. - Patient is warned not to take insulin without proper monitoring per orders. -Adjustment parameters are given for hypo and hyperglycemia in writing. -Patient is encouraged to call clinic for blood glucose levels less than 70 or above 300 mg /dl. - I will discontinue Invokana . She wishes to try low-dose metformin. I discussed and initiated metformin 500 mg by mouth twice a day, side effects and precautions discussed with her.  - Patient will be considered for incretin therapy as appropriate next visit. - Patient specific target  A1c;  LDL, HDL, Triglycerides, and  Waist Circumference were discussed in detail.  2) BP/HTN: Controlled.   She is advised to take her blood pressure medications always in the morning. She will be considered for ACE inhibitor/ARB next visit.   3) Lipids/HPL:  Control unknown, I will obtain fasting lipid panel on subsequent visits. She is not on statins likely  due to a concern of elevated transaminases. -However she will be reassessed for utility of statins after her next labs. 4)  obesity: CDE Consult in progress , exercise, and detailed carbohydrates information provided.  5) Chronic Care/Health Maintenance:  -Patient is encouraged to continue to follow up with Ophthalmology, Podiatrist at least yearly or according to recommendations, and advised to   stay away from smoking. I have recommended yearly flu vaccine and pneumonia vaccination at least every 5 years; moderate intensity exercise for up to 150 minutes weekly; and  sleep for at least 7 hours a day.  - I advised patient to maintain close follow up with Monico Blitz, MD for primary care needs. - Time spent with the patient: 25 min, of which >50% was spent in reviewing her sugar logs , discussing her hypo- and hyper-glycemic episodes, reviewing her current and  previous labs and insulin doses and developing a plan to avoid hypo- and hyper-glycemia.   Follow up plan: - Return in about 3 months (around  08/31/2017) for follow up with pre-visit labs, meter, and logs.  Glade Lloyd, MD Phone: [phone]  Fax: [phone]  -  This note was partially dictated with voice recognition software. Similar sounding words can be transcribed inadequately or may not  be corrected upon review.  05/31/2017, 3:26 PM

## 2017-06-02 ENCOUNTER — Other Ambulatory Visit (HOSPITAL_COMMUNITY): Payer: Medicare Other

## 2017-06-05 ENCOUNTER — Telehealth: Payer: Self-pay | Admitting: "Endocrinology

## 2017-06-05 MED ORDER — INSULIN DETEMIR 100 UNIT/ML FLEXPEN: 70.0000 [IU] | PEN_INJECTOR | Freq: Every day | SUBCUTANEOUS | 2 refills | Status: DC

## 2017-06-05 NOTE — Telephone Encounter (Signed)
Tilda Burrow is stating she needs a refill on Insulin Detemir (LEVEMIR FLEXTOUCH) 100 UNIT/ML Pen sent to [facility] in [place]

## 2017-06-07 ENCOUNTER — Encounter (HOSPITAL_COMMUNITY): Payer: Medicare Other | Attending: Oncology

## 2017-06-12 ENCOUNTER — Encounter (HOSPITAL_COMMUNITY): Payer: Medicare Other | Attending: Oncology

## 2017-06-12 DIAGNOSIS — D500 Iron deficiency anemia secondary to blood loss (chronic): Secondary | ICD-10-CM

## 2017-06-12 DIAGNOSIS — D473 Essential (hemorrhagic) thrombocythemia: Secondary | ICD-10-CM | POA: Diagnosis not present

## 2017-06-12 DIAGNOSIS — D72829 Elevated white blood cell count, unspecified: Secondary | ICD-10-CM | POA: Insufficient documentation

## 2017-06-12 DIAGNOSIS — D75839 Thrombocytosis, unspecified: Secondary | ICD-10-CM

## 2017-06-12 LAB — CBC WITH DIFFERENTIAL/PLATELET
BASOS PCT: 1 %
Basophils Absolute: 0.1 10*3/uL (ref 0.0–0.1)
EOS ABS: 0.3 10*3/uL (ref 0.0–0.7)
Eosinophils Relative: 2 %
HCT: 43 % (ref 36.0–46.0)
Hemoglobin: 13 g/dL (ref 12.0–15.0)
Lymphocytes Relative: 29 %
Lymphs Abs: 4.2 10*3/uL — ABNORMAL HIGH (ref 0.7–4.0)
MCH: 29.1 pg (ref 26.0–34.0)
MCHC: 30.2 g/dL (ref 30.0–36.0)
MCV: 96.2 fL (ref 78.0–100.0)
MONO ABS: 0.9 10*3/uL (ref 0.1–1.0)
MONOS PCT: 6 %
NEUTROS PCT: 62 %
Neutro Abs: 8.9 10*3/uL — ABNORMAL HIGH (ref 1.7–7.7)
Platelets: 459 10*3/uL — ABNORMAL HIGH (ref 150–400)
RBC: 4.47 MIL/uL (ref 3.87–5.11)
RDW: 13.2 % (ref 11.5–15.5)
WBC: 14.3 10*3/uL — ABNORMAL HIGH (ref 4.0–10.5)

## 2017-06-12 LAB — COMPREHENSIVE METABOLIC PANEL
ALBUMIN: 4.1 g/dL (ref 3.5–5.0)
ALT: 20 U/L (ref 14–54)
ANION GAP: 8 (ref 5–15)
AST: 20 U/L (ref 15–41)
Alkaline Phosphatase: 138 U/L — ABNORMAL HIGH (ref 38–126)
BILIRUBIN TOTAL: 0.4 mg/dL (ref 0.3–1.2)
BUN: 16 mg/dL (ref 6–20)
CO2: 31 mmol/L (ref 22–32)
Calcium: 9.3 mg/dL (ref 8.9–10.3)
Chloride: 94 mmol/L — ABNORMAL LOW (ref 101–111)
Creatinine, Ser: 0.55 mg/dL (ref 0.44–1.00)
GFR calc Af Amer: >60 mL/min (ref >60)
Glucose, Bld: 150 mg/dL — ABNORMAL HIGH (ref 65–99)
POTASSIUM: 3.9 mmol/L (ref 3.5–5.1)
Sodium: 133 mmol/L — ABNORMAL LOW (ref 135–145)
TOTAL PROTEIN: 7.5 g/dL (ref 6.5–8.1)

## 2017-06-13 LAB — FERRITIN: Ferritin: 19 ng/mL (ref 11–307)

## 2017-06-13 LAB — IRON AND TIBC
Iron: 43 ug/dL (ref 28–170)
SATURATION RATIOS: 9 % — AB (ref 10.4–31.8)
TIBC: 472 ug/dL — ABNORMAL HIGH (ref 250–450)
UIBC: 429 ug/dL

## 2017-06-14 ENCOUNTER — Ambulatory Visit (HOSPITAL_COMMUNITY): Payer: Medicare Other

## 2017-06-19 ENCOUNTER — Encounter: Payer: Self-pay | Admitting: Internal Medicine

## 2017-06-19 ENCOUNTER — Other Ambulatory Visit: Payer: Self-pay

## 2017-06-19 ENCOUNTER — Encounter (HOSPITAL_BASED_OUTPATIENT_CLINIC_OR_DEPARTMENT_OTHER): Payer: Medicare Other | Admitting: Oncology

## 2017-06-19 ENCOUNTER — Encounter (HOSPITAL_COMMUNITY): Payer: Self-pay | Admitting: Oncology

## 2017-06-19 VITALS — BP 140/66 | HR 107 | Temp 98.7°F | Resp 18 | Ht 62.0 in | Wt 204.0 lb

## 2017-06-19 DIAGNOSIS — D75839 Thrombocytosis, unspecified: Secondary | ICD-10-CM

## 2017-06-19 DIAGNOSIS — D473 Essential (hemorrhagic) thrombocythemia: Secondary | ICD-10-CM

## 2017-06-19 DIAGNOSIS — D72829 Elevated white blood cell count, unspecified: Secondary | ICD-10-CM

## 2017-06-19 DIAGNOSIS — D508 Other iron deficiency anemias: Secondary | ICD-10-CM

## 2017-06-19 NOTE — Progress Notes (Signed)
[facility]  PROGRESS NOTE  Patient Care Team: Monico Blitz, MD as PCP - General (Internal Medicine)  CHIEF COMPLAINTS/PURPOSE OF CONSULTATION:  Leukocytosis   HISTORY OF PRESENTING ILLNESS:  Carrie Turner 66 y.o. female is here for f/u of leukocytosis.   She is doing well today. She states she started working out a few weeks ago and is complaining of achy muscle pains in her legs. No recent infections. Denies chest pain, SOB, abdominal pain, or any other concerns.   MEDICAL HISTORY:  Past Medical History:  Diagnosis Date  . Anxiety   . Depression   . Diabetes mellitus, type II ([place])   . GERD (gastroesophageal reflux disease)   . Hyperlipidemia   . Hypertension   . Migraines   . Neuropathy   . Vitamin D deficiency     SURGICAL HISTORY: Past Surgical History:  Procedure Laterality Date  . ABDOMINAL HYSTERECTOMY    . KNEE ARTHROSCOPY Right   . ORIF ANKLE FRACTURE Right     SOCIAL HISTORY: Social History   Socioeconomic History  . Marital status: Single    Spouse name: Not on file  . Number of children: Not on file  . Years of education: Not on file  . Highest education level: Not on file  Social Needs  . Financial resource strain: Not on file  . Food insecurity - worry: Not on file  . Food insecurity - inability: Not on file  . Transportation needs - medical: Not on file  . Transportation needs - non-medical: Not on file  Occupational History  . Not on file  Tobacco Use  . Smoking status: Never Smoker  . Smokeless tobacco: Never Used  Substance and Sexual Activity  . Alcohol use: No  . Drug use: No  . Sexual activity: No  Other Topics Concern  . Not on file  Social History Narrative  . Not on file    FAMILY HISTORY: Family History  Problem Relation Age of Onset  . Hypertension Mother   . Colon cancer Mother   . Alcohol abuse Father   . Pancreatic cancer Sister   . Stroke Brother   . Cancer Maternal Grandmother   . Cancer  Maternal Grandfather   . Cancer Paternal Grandmother   . Other Paternal Grandfather   . Other Brother     ALLERGIES:  is allergic to sulfa antibiotics.  MEDICATIONS:  Current Outpatient Medications  Medication Sig Dispense Refill  . amLODipine (NORVASC) 2.5 MG tablet Take 2.5 mg by mouth daily.    . Aspirin-Acetaminophen-Caffeine (EXCEDRIN MIGRAINE PO) Take by mouth.    . citalopram (CELEXA) 10 MG tablet Take 10 mg by mouth daily.    Marland Kitchen gabapentin (NEURONTIN) 100 MG capsule Take 300 mg by mouth at bedtime.    Marland Kitchen glucose blood (ONETOUCH VERIO) test strip Use as instructed 4 x daily 150 each 5  . Insulin Detemir (LEVEMIR FLEXTOUCH) 100 UNIT/ML Pen Inject 70 Units at bedtime into the skin. 30 mL 2  . insulin lispro (HUMALOG KWIKPEN) 100 UNIT/ML KiwkPen Inject 0.15-0.21 mLs (15-21 Units total) into the skin 3 (three) times daily. 30 mL 2  . metFORMIN (GLUCOPHAGE) 500 MG tablet Take 1 tablet (500 mg total) 2 (two) times daily with a meal by mouth. 60 tablet 2  . omeprazole (PRILOSEC) 20 MG capsule Take 20 mg by mouth 2 (two) times daily before a meal.     No current facility-administered medications for this visit.    Review  of Systems  Constitutional: Negative for malaise/fatigue.  HENT: Negative.   Eyes: Negative.   Respiratory: Negative.  Negative for shortness of breath.   Cardiovascular: Negative.  Negative for chest pain.  Gastrointestinal: Negative for abdominal pain and nausea.  Genitourinary: Negative.   Musculoskeletal: Negative for myalgias.  Skin: Negative.   Neurological: Negative.   Endo/Heme/Allergies: Negative.   Psychiatric/Behavioral: Negative.   All other systems reviewed and are negative. 14 point ROS was done and is otherwise as detailed above or in HPI  PHYSICAL EXAMINATION: ECOG PERFORMANCE STATUS: 1 - Symptomatic but completely ambulatory  Vitals:   06/19/17 1012  BP: 140/66  Pulse: (!) 107  Resp: 18  Temp: 98.7 F (37.1 C)  SpO2: 97%   Filed  Weights   06/19/17 1012  Weight: 204 lb (92.5 kg)   Physical Exam  Constitutional: She is oriented to person, place, and time and well-developed, well-nourished, and in no distress.  HENT:  Head: Normocephalic and atraumatic.  Eyes: EOM are normal. Pupils are equal, round, and reactive to light.  Neck: Normal range of motion. Neck supple.  Cardiovascular: Normal rate, regular rhythm and normal heart sounds.  Pulmonary/Chest: Effort normal and breath sounds normal.  Abdominal: Soft. Bowel sounds are normal. She exhibits no distension. There is no tenderness. There is no rebound and no guarding.  Musculoskeletal: Normal range of motion.  Neurological: She is alert and oriented to person, place, and time. Gait normal.  Skin: Skin is warm and dry.  Nursing note and vitals reviewed.  LABORATORY DATA:  I have reviewed the data as listed  CBC CBC Latest Ref Rng & Units 06/12/2017 02/07/2017 10/05/2016  WBC 4.0 - 10.5 K/uL 14.3(H) 11.8(H) 13.4(H)  Hemoglobin 12.0 - 15.0 g/dL 13.0 13.9 12.3  Hematocrit 36.0 - 46.0 % 43.0 44.3 38.9  Platelets 150 - 400 K/uL 459(H) 506(H) 494(H)   CMP     Component Value Date/Time   NA 133 (L) 06/12/2017 1451   K 3.9 06/12/2017 1451   CL 94 (L) 06/12/2017 1451   CO2 31 06/12/2017 1451   GLUCOSE 150 (H) 06/12/2017 1451   BUN 16 06/12/2017 1451   BUN 16 05/22/2017   CREATININE 0.55 06/12/2017 1451   CALCIUM 9.3 06/12/2017 1451   PROT 7.5 06/12/2017 1451   ALBUMIN 4.1 06/12/2017 1451   AST 20 06/12/2017 1451   ALT 20 06/12/2017 1451   ALKPHOS 138 (H) 06/12/2017 1451   BILITOT 0.4 06/12/2017 1451   GFRNONAA >60 06/12/2017 1451   GFRAA >60 06/12/2017 1451   Outside Labs 07/27/2016 WBC   19.2 (H) Neutrophils (Abs) 11.7 (H) Lymphs (Abs)   5.2 (H) Monocytes (Abs) 1.5 (H)  RBC    4.56 Hgb   11.6 Hematocrit  36.4 MCV   80 MCH   25.5 (L) MCHC   31.9 RDW   16.1 (H) Platelets  651  (H) Neutrophils  61 Lymphs  27 Monocytes  8 Eos   2 Basos   1  Outside Labs 07/20/2016  WBC   16.7 (H) Neutrophils (Abs) 9.5 (H) Lymphs (Abs)  5.3 (H) Monocytes (Abs) 1.3 (H)  RBC   4.63 Hgb   11.9 Hematocrit  37.3 MCV   81 MCH   25.7 (H) MCHC   31.9 RDW   16.2 (H) Platelets  741 (H) Neutrophils  56 Lymphs  32 Monocytes  8 Eos   3 Basos   0  Hematopath Smear 08/04/2016 Diagnosis: Neutrophilic leukocytosis and mild lymphocytosis. Cells are mature. Reactive  changes are present.  Absolute monocytosis also present.   PATHOLOGY:  BCR-ABL1, CML/ALL, PCR, QUANT  Order: [id number]   Status:  Edited Result - FINAL Visible to patient:  No (Not Released) Next appt:  11/28/2016 at 11:30 AM in Endocrinology Glade Lloyd, MD) Dx:  Thrombocytosis ([place]); Anemia, unspeci...   Ref Range & Units 3wk ago  b2a2 transcript % Comment   Comments: (NOTE)       <0.0032 %  (sensitivity limit of assay)   b3a2 transcript % Comment   Comments: (NOTE)       <0.0032 %  (sensitivity limit of assay)   E1A2 Transcript % Comment   Comments: (NOTE)       <0.0032 %  (sensitivity limit of assay)   Interpretation (BCRAL):  Comment   Comments: (NOTE)  NEGATIVE for the BCR-ABL1 e1a2 (p190), e13a2 (b2a2, p210) and e14a2  (b3a2, p210) fusion transcripts. These results do not rule out the  presence of rare BCR-ABL1 transcripts not detected by this assay.        JAK2 V617, CALR + JAK2 E12-15 + MPL (reflexed) -negative. Results for MELANA, HINGLE (MRN [id number]) as of 10/26/2016 15:23  Ref. Range 10/05/2016 13:32  Iron Latest Ref Range: 28 - 170 ug/dL 57  UIBC Latest Units: ug/dL 390  TIBC Latest Ref Range: 250 - 450 ug/dL 447  Saturation Ratios Latest Ref Range: 10.4 - 31.8 % 13  Ferritin Latest Ref Range: 11 - 307 ng/mL 18    ASSESSMENT & PLAN:  Leukocytosis -improved Thrombocytosis-improved Mild normocytic anemia- resolved  Workup negative for myeloproliferative disorder  and chronic leukemias including CML/CLL. Leukocytosis continues to trend down.  Patient is no longer anemia at this time. Her thrombocytosis may be reactive to iron deficiency since her ferritin is still low despite improved serum iron. I recommend for her to continue ferrous sulfate 325mg  PO BID with meals and to take a stool softener if constipation develops while on iron supplementation. I have told her her muscle aches may be due to starting her new exercise regimen, but I again recommended for patient to see a rheumatologist for her diffuse myalgia to rule out fibromyalgia as a cause.  She will return for follow up in 4 months with labs; iron studies and CBC.   No orders of the defined types were placed in this encounter.   All questions were answered. The patient knows to call the clinic with any problems, questions or concerns.   This note was electronically signed.    Twana First, MD  06/19/2017 10:26 AM

## 2017-06-27 ENCOUNTER — Ambulatory Visit: Payer: Medicare Other | Admitting: Nutrition

## 2017-07-13 ENCOUNTER — Other Ambulatory Visit: Payer: Self-pay | Admitting: "Endocrinology

## 2017-08-07 ENCOUNTER — Ambulatory Visit: Payer: Medicare Other | Admitting: Gastroenterology

## 2017-08-10 ENCOUNTER — Other Ambulatory Visit: Payer: Self-pay | Admitting: "Endocrinology

## 2017-09-05 ENCOUNTER — Ambulatory Visit: Payer: Medicare Other | Admitting: Nutrition

## 2017-09-05 ENCOUNTER — Ambulatory Visit: Payer: Medicare Other | Admitting: "Endocrinology

## 2017-09-20 ENCOUNTER — Ambulatory Visit (INDEPENDENT_AMBULATORY_CARE_PROVIDER_SITE_OTHER): Payer: Medicare Other | Admitting: Gastroenterology

## 2017-09-20 ENCOUNTER — Encounter: Payer: Self-pay | Admitting: Gastroenterology

## 2017-09-20 DIAGNOSIS — R110 Nausea: Secondary | ICD-10-CM

## 2017-09-20 DIAGNOSIS — K219 Gastro-esophageal reflux disease without esophagitis: Secondary | ICD-10-CM | POA: Insufficient documentation

## 2017-09-20 DIAGNOSIS — R197 Diarrhea, unspecified: Secondary | ICD-10-CM

## 2017-09-20 DIAGNOSIS — K529 Noninfective gastroenteritis and colitis, unspecified: Secondary | ICD-10-CM | POA: Diagnosis not present

## 2017-09-20 DIAGNOSIS — Z800 Family history of malignant neoplasm of digestive organs: Secondary | ICD-10-CM

## 2017-09-20 DIAGNOSIS — R1013 Epigastric pain: Secondary | ICD-10-CM

## 2017-09-20 DIAGNOSIS — R1110 Vomiting, unspecified: Secondary | ICD-10-CM | POA: Insufficient documentation

## 2017-09-20 DIAGNOSIS — R748 Abnormal levels of other serum enzymes: Secondary | ICD-10-CM

## 2017-09-20 DIAGNOSIS — K625 Hemorrhage of anus and rectum: Secondary | ICD-10-CM | POA: Insufficient documentation

## 2017-09-20 LAB — HEMOGLOBIN A1C: Hemoglobin A1C: 10.5

## 2017-09-20 LAB — BASIC METABOLIC PANEL
BUN: 16 (ref 4–21)
Creatinine: 0.5 (ref 0.5–1.1)

## 2017-09-20 NOTE — Progress Notes (Signed)
Primary Care Physician:  Monico Blitz, MD  Primary Gastroenterologist:  Garfield Cornea, MD   Chief Complaint  Patient presents with  . Gastroesophageal Reflux  . colitis    HPI:  Carrie Turner is a 67 y.o. female here at the request of Dr. Brigitte Pulse for further evaluation of Jerrye Bushy. Patient states her typical heartburn symptoms are well controlled on Protonix once daily however she has frequent epigastric pain. She has tried ginger crystals. Takes Phenergan for nausea. Rarely has vomiting. Feels nauseated every single day. She reports gallbladder workup was unremarkable. She reports being on multiple different PPI's without relief. Rarely uses ibuprofen.  Patient was hospitalized at [facility] back in January. She had a CT without contrast showing stable hiatal hernia and paraumbilical hernia containing only fat. She brings in records from February 1 highlighting possible diagnosis of colitis. She was given Cipro and Flagyl. She reports going to ED at least three separate times for "colitis" in the past.   Received records of EGD and colonoscopy in May 2017 by Dr. Britta Mccreedy at which time she was found to have a hiatal hernia but otherwise unremarkable upper G.I. tract. Sessile polyp removed from the transverse colon, measuring 5 to 9 mm which was benign. Patient advised to come back in five years.  Diarrhea real bad for last two months but has been fairly persistent for the past one year. Goes multiple times during the day. She was heme negative back in December. She sees bright red blood per rectum on occasion. Currently taking Imodium, generally takes 2 after 3 bowel movements and then she may get constipated.  Patient has also been seen multiple times by hematology for leukocytosis and mild iron deficiency.  She has had chronically elevated alk phos, with deviously abnormal AST/ALT but more recently that has been normal. Liver appeared somewhat fatty on CT with contrast back in November  2018.  Current Outpatient Medications  Medication Sig Dispense Refill  . amLODipine (NORVASC) 2.5 MG tablet Take 2.5 mg by mouth daily.    . cholecalciferol (VITAMIN D) 1000 units tablet Take 1,000 Units by mouth daily.    . citalopram (CELEXA) 10 MG tablet Take 20 mg by mouth daily.     . cyanocobalamin 1000 MCG tablet Take 1,000 mcg by mouth daily.    Marland Kitchen gabapentin (NEURONTIN) 800 MG tablet Take 800 mg by mouth 3 (three) times daily.    Marland Kitchen ibuprofen (ADVIL,MOTRIN) 200 MG tablet Take 400 mg by mouth every 6 (six) hours as needed.    . Insulin Detemir (LEVEMIR FLEXTOUCH) 100 UNIT/ML Pen Inject 70 Units at bedtime into the skin. 30 mL 2  . Insulin Detemir (LEVEMIR FLEXTOUCH) 100 UNIT/ML Pen Inject 70 Units into the skin at bedtime. 30 mL 2  . insulin lispro (HUMALOG KWIKPEN) 100 UNIT/ML KiwkPen Inject 0.15-0.21 mLs (15-21 Units total) into the skin 3 (three) times daily. 30 mL 2  . lisinopril (PRINIVIL,ZESTRIL) 20 MG tablet Take 20 mg by mouth 2 (two) times daily.    . metFORMIN (GLUCOPHAGE) 500 MG tablet Take 1 tablet (500 mg total) 2 (two) times daily with a meal by mouth. 60 tablet 2  . ONETOUCH VERIO test strip USE 1 STRIP TO CHECK GLUCOSE 4 TIMES DAILY 150 each 5  . pantoprazole (PROTONIX) 40 MG tablet Take 40 mg by mouth daily.    . pravastatin (PRAVACHOL) 40 MG tablet Take 40 mg by mouth daily.    . Pyridoxine HCl (VITAMIN B6 PO) Take by mouth daily.    Marland Kitchen  Suvorexant (BELSOMRA) 20 MG TABS Take by mouth daily.     No current facility-administered medications for this visit.     Allergies as of 09/20/2017 - Review Complete 09/20/2017  Allergen Reaction Noted  . Sulfa antibiotics  03/04/2016    Past Medical History:  Diagnosis Date  . Anxiety   . Depression   . Diabetes mellitus, type II ([place])   . GERD (gastroesophageal reflux disease)   . Hyperlipidemia   . Hypertension   . Migraines   . Neuropathy   . Vitamin D deficiency     Past Surgical History:  Procedure Laterality  Date  . ABDOMINAL HYSTERECTOMY    . COLONOSCOPY  11/2015   Dr. Britta Mccreedy: sessile polyp removed (benign). advised repeat colonoscopy in 5 years.   . ESOPHAGOGASTRODUODENOSCOPY  11/2015   Dr. Britta Mccreedy: hiatal hernia  . KNEE ARTHROSCOPY Right   . ORIF ANKLE FRACTURE Right     Family History  Problem Relation Age of Onset  . Hypertension Mother   . Colon cancer Mother   . Alcohol abuse Father   . Pancreatic cancer Sister   . Stroke Brother   . Cancer Maternal Grandmother   . Cancer Maternal Grandfather   . Cancer Paternal Grandmother   . Other Paternal Grandfather   . Other Brother     Social History   Socioeconomic History  . Marital status: Single    Spouse name: Not on file  . Number of children: Not on file  . Years of education: Not on file  . Highest education level: Not on file  Social Needs  . Financial resource strain: Not on file  . Food insecurity - worry: Not on file  . Food insecurity - inability: Not on file  . Transportation needs - medical: Not on file  . Transportation needs - non-medical: Not on file  Occupational History  . Not on file  Tobacco Use  . Smoking status: Never Smoker  . Smokeless tobacco: Never Used  Substance and Sexual Activity  . Alcohol use: No  . Drug use: No  . Sexual activity: No  Other Topics Concern  . Not on file  Social History Narrative  . Not on file      ROS:  General: Negative for anorexia, weight loss, fever, chills, fatigue, weakness. Eyes: Negative for vision changes.  ENT: Negative for hoarseness, difficulty swallowing , nasal congestion. CV: Negative for chest pain, angina, palpitations, dyspnea on exertion, peripheral edema.  Respiratory: Negative for dyspnea at rest, dyspnea on exertion, cough, sputum, wheezing.  GI: See history of present illness. GU:  Negative for dysuria, hematuria, urinary incontinence, urinary frequency, nocturnal urination.  MS: Negative for joint pain, low back pain.  Derm: Negative  for rash or itching.  Neuro: Negative for weakness, abnormal sensation, seizure, frequent headaches, memory loss, confusion.  Psych: Negative for anxiety, depression, suicidal ideation, hallucinations.  Endo: Negative for unusual weight change.  Heme: Negative for bruising or bleeding. Allergy: Negative for rash or hives.    Physical Examination:  BP (!) 153/92   Pulse (!) 106   Temp 97.6 F (36.4 C) (Oral)   Ht 5' 3"  (1.6 m)   Wt 206 lb 6.4 oz (93.6 kg)   BMI 36.56 kg/m    General: Well-nourished, well-developed in no acute distress.  Head: Normocephalic, atraumatic.   Eyes: Conjunctiva pink, no icterus. Mouth: Oropharyngeal mucosa moist and pink , no lesions erythema or exudate. Neck: Supple without thyromegaly, masses, or lymphadenopathy.  Lungs: Clear  to auscultation bilaterally.  Heart: Regular rate and rhythm, no murmurs rubs or gallops.  Abdomen: Bowel sounds are normal, nontender, nondistended, no hepatosplenomegaly or masses, no abdominal bruits or    hernia , no rebound or guarding.   Rectal: not performed Extremities: No lower extremity edema. No clubbing or deformities.  Neuro: Alert and oriented x 4 , grossly normal neurologically.  Skin: Warm and dry, no rash or jaundice.   Psych: Alert and cooperative, normal mood and affect.  Labs: labs dated 21 2019 white blood cell count 15,600, hemoglobin 12.7, hematocrit 40.5, platelets 479,000, total bilirubin less than 0.2, alkaline phosphatase 152 h, AST 15.8, ALT 19, albumin 4.1, INR 0.9  Lab Results  Component Value Date   IRON 43 06/12/2017   TIBC 472 (H) 06/12/2017   FERRITIN 19 06/12/2017   Lab Results  Component Value Date   WBC 14.3 (H) 06/12/2017   HGB 13.0 06/12/2017   HCT 43.0 06/12/2017   MCV 96.2 06/12/2017   PLT 459 (H) 06/12/2017   Lab Results  Component Value Date   CREATININE 0.55 06/12/2017   BUN 16 06/12/2017   NA 133 (L) 06/12/2017   K 3.9 06/12/2017   CL 94 (L) 06/12/2017   CO2 31  06/12/2017   Lab Results  Component Value Date   ALT 20 06/12/2017   AST 20 06/12/2017   ALKPHOS 138 (H) 06/12/2017   BILITOT 0.4 06/12/2017   Lab Results  Component Value Date   HGBA1C 9.5 05/22/2017     Imaging Studies: No results found.

## 2017-09-20 NOTE — Patient Instructions (Signed)
1. Once I have reviewed your records we will be in touch with next step in your work up.

## 2017-09-26 ENCOUNTER — Other Ambulatory Visit: Payer: Self-pay

## 2017-09-26 ENCOUNTER — Encounter: Payer: Self-pay | Admitting: Gastroenterology

## 2017-09-26 DIAGNOSIS — K625 Hemorrhage of anus and rectum: Secondary | ICD-10-CM

## 2017-09-26 DIAGNOSIS — R1013 Epigastric pain: Secondary | ICD-10-CM

## 2017-09-26 DIAGNOSIS — K529 Noninfective gastroenteritis and colitis, unspecified: Secondary | ICD-10-CM

## 2017-09-26 DIAGNOSIS — R748 Abnormal levels of other serum enzymes: Secondary | ICD-10-CM | POA: Insufficient documentation

## 2017-09-26 DIAGNOSIS — K219 Gastro-esophageal reflux disease without esophagitis: Secondary | ICD-10-CM

## 2017-09-26 DIAGNOSIS — Z800 Family history of malignant neoplasm of digestive organs: Secondary | ICD-10-CM

## 2017-09-26 DIAGNOSIS — R110 Nausea: Secondary | ICD-10-CM

## 2017-09-26 MED ORDER — PEG 3350-KCL-NA BICARB-NACL 420 G PO SOLR: 4000.0000 mL | ORAL | 0 refills | Status: DC

## 2017-09-26 NOTE — Assessment & Plan Note (Addendum)
67 year old female presenting for further evaluation of frequent nausea without vomiting, epigastric pain, diarrhea for the past year, rectal bleeding. She reports being treated for "colitis" at least three times. Recently completed Cipro and Flagyl provided by ED at [facility]. Patient's last upper endoscopy and colonoscopy were in May 2017 Dr. Britta Mccreedy. This was prior to development of diarrhea and rectal bleeding. She is also been followed by hematology for mild iron deficiency anemia as well as leukocytosis/thrombocytosis. Workup negative formyeloproliferative disorder and chronic leukemias including CML/CLL.   At this time I would offer her an upper endoscopy and colonoscopy for evaluation of her symptoms. Celiac disease as well as microscopic colitis need to be considered. She also has a pretty significant family history of colon cancer.  I have discussed the risks, alternatives, benefits with regards to but not limited to the risk of reaction to medication, bleeding, infection, perforation and the patient is agreeable to proceed. Written consent to be obtained.

## 2017-09-26 NOTE — Progress Notes (Signed)
cc'ed to pcp °

## 2017-09-26 NOTE — Progress Notes (Signed)
Please schedule EGD and colonoscopy with Dr. Gala Romney with propofol.  Day before colonoscopy:, reduced Levemir by half of her usual dose, reduce Humalog by half of her normal dose, reduce metformin by half of her normal dose.  A.m. of colonoscopy, whole diabetic medications i.e. Levemir, Humalog, metformin.   At pre-op I need the following labs: Hep B surface antigen, Hep C antibody, mitochondrial antibody (AMA), ANA, smooth muscle antibody,  IgG/IgM/IgA, Tissue transglutimase IgA.

## 2017-09-26 NOTE — Assessment & Plan Note (Signed)
Chronically elevated alkaline phosphatase (mild). AST/ALT previously elevated in 2017 but more recently normal. Would consider limited work up to include AMA. Will see if can be done at time of pre-op labs.

## 2017-09-26 NOTE — Progress Notes (Signed)
Called and informed pt of pre-op appt 10/19/17 at 12:45pm. Letter mailed with procedure instructions.  Additional lab orders entered to be drawn with pre-op labs.

## 2017-09-26 NOTE — Progress Notes (Signed)
Called and informed pt. TCS/EGD w/Propofol w/RMR scheduled for 10/26/17 at 12:45pm. Rx for prep sent to pharmacy. Orders entered. Will mail instructions after pre-op appt is scheduled.

## 2017-10-11 ENCOUNTER — Encounter: Payer: Medicare Other | Attending: Internal Medicine | Admitting: Nutrition

## 2017-10-11 ENCOUNTER — Ambulatory Visit (INDEPENDENT_AMBULATORY_CARE_PROVIDER_SITE_OTHER): Payer: Medicare Other | Admitting: "Endocrinology

## 2017-10-11 ENCOUNTER — Encounter: Payer: Self-pay | Admitting: Nutrition

## 2017-10-11 ENCOUNTER — Encounter: Payer: Self-pay | Admitting: "Endocrinology

## 2017-10-11 VITALS — Ht 62.0 in | Wt 209.0 lb

## 2017-10-11 VITALS — BP 136/84 | HR 88 | Ht 62.0 in | Wt 209.0 lb

## 2017-10-11 DIAGNOSIS — E1165 Type 2 diabetes mellitus with hyperglycemia: Secondary | ICD-10-CM | POA: Diagnosis present

## 2017-10-11 DIAGNOSIS — IMO0002 Reserved for concepts with insufficient information to code with codable children: Secondary | ICD-10-CM

## 2017-10-11 DIAGNOSIS — I10 Essential (primary) hypertension: Secondary | ICD-10-CM

## 2017-10-11 DIAGNOSIS — E118 Type 2 diabetes mellitus with unspecified complications: Secondary | ICD-10-CM

## 2017-10-11 DIAGNOSIS — Z713 Dietary counseling and surveillance: Secondary | ICD-10-CM | POA: Insufficient documentation

## 2017-10-11 DIAGNOSIS — Z6834 Body mass index (BMI) 34.0-34.9, adult: Secondary | ICD-10-CM

## 2017-10-11 DIAGNOSIS — E669 Obesity, unspecified: Secondary | ICD-10-CM

## 2017-10-11 DIAGNOSIS — E782 Mixed hyperlipidemia: Secondary | ICD-10-CM

## 2017-10-11 DIAGNOSIS — E6609 Other obesity due to excess calories: Secondary | ICD-10-CM | POA: Diagnosis not present

## 2017-10-11 DIAGNOSIS — Z794 Long term (current) use of insulin: Secondary | ICD-10-CM

## 2017-10-11 NOTE — Patient Instructions (Signed)

## 2017-10-11 NOTE — Progress Notes (Signed)
Subjective:    Patient ID: Carrie Turner, female    DOB: 1951-03-16. Patient is being seen in follow-up for management of diabetes requested by  Monico Blitz, MD  Past Medical History:  Diagnosis Date  . Anxiety   . COPD (chronic obstructive pulmonary disease) ([place])   . Depression   . Diabetes mellitus, type II ([place])   . GERD (gastroesophageal reflux disease)   . Hyperlipidemia   . Hypertension   . Migraines   . Neuropathy   . Vitamin D deficiency    Past Surgical History:  Procedure Laterality Date  . ABDOMINAL HYSTERECTOMY    . COLONOSCOPY  11/2015   Dr. Britta Mccreedy: sessile polyp removed (benign). advised repeat colonoscopy in 5 years.   . ESOPHAGOGASTRODUODENOSCOPY  11/2015   Dr. Britta Mccreedy: hiatal hernia  . KNEE ARTHROSCOPY Right   . ORIF ANKLE FRACTURE Right    Social History   Socioeconomic History  . Marital status: Single    Spouse name: None  . Number of children: None  . Years of education: None  . Highest education level: None  Social Needs  . Financial resource strain: None  . Food insecurity - worry: None  . Food insecurity - inability: None  . Transportation needs - medical: None  . Transportation needs - non-medical: None  Occupational History  . None  Tobacco Use  . Smoking status: Never Smoker  . Smokeless tobacco: Never Used  Substance and Sexual Activity  . Alcohol use: No  . Drug use: No  . Sexual activity: No  Other Topics Concern  . None  Social History Narrative  . None   Outpatient Encounter Medications as of 10/11/2017  Medication Sig  . amLODipine (NORVASC) 2.5 MG tablet Take 2.5 mg by mouth daily.  . cholecalciferol (VITAMIN D) 1000 units tablet Take 1,000 Units by mouth daily.  . citalopram (CELEXA) 10 MG tablet Take 20 mg by mouth daily.   . cyanocobalamin 1000 MCG tablet Take 1,000 mcg by mouth daily.  Marland Kitchen gabapentin (NEURONTIN) 800 MG tablet Take 800 mg by mouth 3 (three) times daily.  Marland Kitchen ibuprofen (ADVIL,MOTRIN) 200 MG tablet  Take 400 mg by mouth every 6 (six) hours as needed.  . Insulin Detemir (LEVEMIR FLEXTOUCH) 100 UNIT/ML Pen Inject 70 Units into the skin at bedtime.  . insulin lispro (HUMALOG KWIKPEN) 100 UNIT/ML KiwkPen Inject 0.15-0.21 mLs (15-21 Units total) into the skin 3 (three) times daily.  Marland Kitchen lisinopril (PRINIVIL,ZESTRIL) 20 MG tablet Take 20 mg by mouth 2 (two) times daily.  Glory Rosebush VERIO test strip USE 1 STRIP TO CHECK GLUCOSE 4 TIMES DAILY  . pantoprazole (PROTONIX) 40 MG tablet Take 40 mg by mouth daily.  . polyethylene glycol-electrolytes (TRILYTE) 420 g solution Take 4,000 mLs by mouth as directed.  . pravastatin (PRAVACHOL) 40 MG tablet Take 40 mg by mouth daily.  . Pyridoxine HCl (VITAMIN B6 PO) Take by mouth daily.  . Suvorexant (BELSOMRA) 20 MG TABS Take by mouth daily.  . [DISCONTINUED] Insulin Detemir (LEVEMIR FLEXTOUCH) 100 UNIT/ML Pen Inject 70 Units at bedtime into the skin.  . [DISCONTINUED] metFORMIN (GLUCOPHAGE) 500 MG tablet Take 1 tablet (500 mg total) 2 (two) times daily with a meal by mouth.   No facility-administered encounter medications on file as of 10/11/2017.    ALLERGIES: Allergies  Allergen Reactions  . Sulfa Antibiotics    VACCINATION STATUS:  There is no immunization history on file for this patient.  Diabetes  She presents for  her follow-up diabetic visit. She has type 2 diabetes mellitus. Onset time: She was diagnosed at approximate age of 32 years. Her disease course has been worsening. There are no hypoglycemic associated symptoms. Pertinent negatives for hypoglycemia include no confusion, headaches, pallor or seizures. Associated symptoms include fatigue, polydipsia and polyuria. Pertinent negatives for diabetes include no blurred vision, no chest pain and no polyphagia. There are no hypoglycemic complications. Symptoms are worsening. There are no diabetic complications. Risk factors for coronary artery disease include diabetes mellitus, dyslipidemia, obesity  and sedentary lifestyle. Current diabetic treatment includes insulin injections (She did not freeze her insulin dose as recommended during her last visit.). She is compliant with treatment some of the time. Her weight is stable. She is following a generally unhealthy diet. When asked about meal planning, she reported none. She has not had a previous visit with a dietitian (She will see the dietitian today.). She never participates in exercise. Her home blood glucose trend is decreasing steadily. Her overall blood glucose range is 180-200 mg/dl. (She cannot confirm if she has been taking the insulin prescribed for her last visit.  Her A1c is higher at 10.5%.  She did not bring any meter nor logs for recent months.) An ACE inhibitor/angiotensin II receptor blocker is not being taken. Eye exam is current.  Hyperlipidemia  This is a chronic problem. The current episode started more than 1 year ago. Recent lipid tests were reviewed and are high. Exacerbating diseases include diabetes and obesity. Pertinent negatives include no chest pain, myalgias or shortness of breath. She is currently on no antihyperlipidemic treatment. Compliance problems include adherence to diet and adherence to exercise.  Risk factors for coronary artery disease include diabetes mellitus, dyslipidemia, hypertension, obesity and a sedentary lifestyle.  Hypertension  This is a chronic problem. The current episode started more than 1 year ago. The problem is controlled. Pertinent negatives include no blurred vision, chest pain, headaches, palpitations or shortness of breath. Risk factors for coronary artery disease include diabetes mellitus, dyslipidemia and sedentary lifestyle. Past treatments include calcium channel blockers. Compliance problems include diet and exercise.     Review of Systems  Constitutional: Positive for fatigue. Negative for unexpected weight change.  HENT: Negative for trouble swallowing and voice change.   Eyes:  Negative for blurred vision and visual disturbance.  Respiratory: Negative for cough, shortness of breath and wheezing.   Cardiovascular: Negative for chest pain, palpitations and leg swelling.  Gastrointestinal: Negative for diarrhea, nausea and vomiting.  Endocrine: Positive for polydipsia and polyuria. Negative for cold intolerance, heat intolerance and polyphagia.  Genitourinary: Positive for frequency. Negative for dysuria and flank pain.  Musculoskeletal: Negative for arthralgias and myalgias.  Skin: Negative for color change, pallor, rash and wound.  Neurological: Negative for seizures and headaches.  Psychiatric/Behavioral: Negative for confusion and suicidal ideas.    Objective:    BP 136/84   Pulse 88   Ht 5\' 2"  (1.575 m)   Wt 209 lb (94.8 kg)   BMI 38.23 kg/m   Wt Readings from Last 3 Encounters:  10/11/17 209 lb (94.8 kg)  10/11/17 209 lb (94.8 kg)  09/20/17 206 lb 6.4 oz (93.6 kg)    Physical Exam  Constitutional: She is oriented to person, place, and time. She appears well-developed.  HENT:  Head: Normocephalic and atraumatic.  Eyes: EOM are normal.  Neck: Normal range of motion. Neck supple. No tracheal deviation present. No thyromegaly present.  Cardiovascular: Normal rate and regular rhythm.  Pulmonary/Chest: Effort  normal and breath sounds normal.  Abdominal: Soft. Bowel sounds are normal. There is no tenderness. There is no guarding.  Musculoskeletal: Normal range of motion. She exhibits no edema.  Neurological: She is alert and oriented to person, place, and time. She has normal reflexes. No cranial nerve deficit. Coordination normal.  Skin: Skin is warm and dry. No rash noted. No erythema. No pallor.  Psychiatric: She has a normal mood and affect. Judgment normal.   Labs: September 20, 2017 A1c 10.5% 05/22/2017 show A1c of 9.5% 02/22/2017 show A1c of 9.5% 08/08/2016 show A1c of 9.6%. 04/20/2016: Her A1c was 11.4% A1c was 10.3% on 01/12/2016,  vitamin  D low at 20.8 on 09/25/2015, liver function test was abnormal with alkaline phosphatase 147 (normal 39-1 17), AST 62, ALT 66. Labs from 05/22/2017 and also showing normal renal function, normal CMP including transaminases.  Recent Results (from the past 2160 hour(s))  Basic metabolic panel     Status: None   Collection Time: 09/20/17 12:00 AM  Result Value Ref Range   BUN 16 4 - 21   Creatinine 0.5 0.5 - 1.1  Hemoglobin A1c     Status: None   Collection Time: 09/20/17 12:00 AM  Result Value Ref Range   Hemoglobin A1C 10.5      Assessment & Plan:   1. Uncontrolled type 2 diabetes mellitus with complication, with long-term current use of insulin ([place])  - Patient has currently uncontrolled symptomatic type 2 DM since  67 years of age. -Tragically, she stopped monitoring and injecting insulin for unclear reasons for several weeks now.  She returns with no meter nor logs for her recent months. - Her most recent labs Show A1c of 10.5% worsening from 9.5%. -Recent labs reviewed, showing normal renal function, transaminases back to normal.   Her diabetes is complicated by obesity and sedentary life and patient remains at a high risk for more acute and chronic complications of diabetes which include CAD, CVA, CKD, retinopathy, and neuropathy. These are all discussed in detail with the patient.  - I have counseled the patient on diet management and weight loss, by adopting a carbohydrate restricted/protein rich diet.   -  Suggestion is made for her to avoid simple carbohydrates  from her diet including Cakes, Sweet Desserts / Pastries, Ice Cream, Soda (diet and regular), Sweet Tea, Candies, Chips, Cookies, Store Bought Juices, Alcohol in Excess of  1-2 drinks a day, Artificial Sweeteners, and "Sugar-free" Products. This will help patient to have stable blood glucose profile and potentially avoid unintended weight gain.  - I encouraged the patient to switch to  unprocessed or minimally processed  complex starch and increased protein intake (animal or plant source), fruits, and vegetables.  - Patient is advised to stick to a routine mealtimes to eat 3 meals  a day and avoid unnecessary snacks ( to snack only to correct hypoglycemia).   - I have approached patient with the following individualized plan to manage diabetes and patient agrees:   -She returns with no recent logs and cannot confirm if she has been taking the prescribed insulin.  -She is approached to resume with her original order with Levemir 70 units nightly, NovoLog 15 units 3 times daily before meals for pre-meal BG readings of 90-150mg /dl, plus patient specific correction dose for unexpected hyperglycemia above 150mg /dl, associated with strict monitoring of glucose 4 times a day-before meals and at bedtime. -He is asked to return in 1 week with her meter and logs. - Patient  is warned not to take insulin without proper monitoring per orders. -Adjustment parameters are given for hypo and hyperglycemia in writing. -Patient is encouraged to call clinic for blood glucose levels less than 70 or above 300 mg /dl. -He did not tolerate the low-dose metformin retry.  I advised her to discontinue.    - Patient will be considered for incretin therapy as appropriate next visit. - Patient specific target  A1c;  LDL, HDL, Triglycerides, and  Waist Circumference were discussed in detail.  2) BP/HTN: Controlled.   She is advised to take her blood pressure medications always in the morning. She will be considered for ACE inhibitor/ARB next visit.   3) Lipids/HPL:  Control unknown, I will obtain fasting lipid panel on subsequent visits. She is not on statins likely due to a concern of elevated transaminases. -However she will be reassessed for utility of statins after her next labs. 4)  obesity: CDE Consult in progress , exercise, and detailed carbohydrates information provided.  5) Chronic Care/Health Maintenance:  -Patient is  encouraged to continue to follow up with Ophthalmology, Podiatrist at least yearly or according to recommendations, and advised to   stay away from smoking. I have recommended yearly flu vaccine and pneumonia vaccination at least every 5 years; moderate intensity exercise for up to 150 minutes weekly; and  sleep for at least 7 hours a day.  - I advised patient to maintain close follow up with Monico Blitz, MD for primary care needs.  - Time spent with the patient: 25 min, of which >50% was spent in reviewing her blood glucose logs , discussing her hypo- and hyper-glycemic episodes, reviewing her current and  previous labs and insulin doses and developing a plan to avoid hypo- and hyper-glycemia. Please refer to Patient Instructions for Blood Glucose Monitoring and Insulin/Medications Dosing Guide"  in media tab for additional information. Carrie Turner participated in the discussions, expressed understanding, and voiced agreement with the above plans.  All questions were answered to her satisfaction. she is encouraged to contact clinic should she have any questions or concerns prior to her return visit.    Follow up plan: - Return in about 1 week (around 10/18/2017) for follow up with meter and logs- no labs.  Glade Lloyd, MD Phone: [phone]  Fax: [phone]  -  This note was partially dictated with voice recognition software. Similar sounding words can be transcribed inadequately or may not  be corrected upon review.  10/11/2017, 4:21 PM

## 2017-10-11 NOTE — Patient Instructions (Addendum)
Goals 1. Eat 3 balanced meals per day 2. Drink only water  3. Take medications as prescribed. 4. Cut out sodas, sweets and junk food. 5. Increase fresh fruits and vegetables. Get A1C down to 9%

## 2017-10-11 NOTE — Progress Notes (Signed)
Diabetes Self-Management Education  Visit Type: Follow-up  Appt. Start Time: 1515 Appt. End Time: 1610  10/11/2017  [name], identified by name and date of birth, is a 67 y.o. female with a diagnosis of Diabetes:  . Says she fell of the wagon and needs to get back on track. Hasn't been eating right and not walking. Says she has gained about 10+ lbs.  A1C up to 10.5 from 9.5%. Has missed some doses of insulin and testing of blood sugars.  Willing to do better and make better food choices and take meds as prescribed.   BS running > 200's most all the time she reports. Levemir 70 units daily and Humalog 15 units with meals plus sliding scale. Lab Results  Component Value Date   HGBA1C 10.5 09/20/2017      ASSESSMENT  Height 5\' 2"  (1.575 m), weight 209 lb (94.8 kg). Body mass index is 38.23 kg/m.  Diabetes Self-Management Education - 10/11/17 1500      Visit Information   Visit Type  Follow-up      Health Coping   How would you rate your overall health?  Fair      Complications   Fasting Blood glucose range (mg/dL)  >200    Postprandial Blood glucose range (mg/dL)  >200    Have you had a dilated eye exam in the past 12 months?  Yes    Have you had a dental exam in the past 12 months?  No    Are you checking your feet?  No      Dietary Intake   Breakfast   Oatmeal-plain, 1 packet,     Lunch  Skipped:       Dinner  Refried beans,  Diet coke    Snack (evening)  Misc    Beverage(s)  Diet sodas,       Exercise   Exercise Type  ADL's      Patient Education   Nutrition management   Role of diet in the treatment of diabetes and the relationship between the three main macronutrients and blood glucose level;Meal timing in regards to the patients' current diabetes medication.;Reviewed blood glucose goals for pre and post meals and how to evaluate the patients' food intake on their blood glucose level.    Monitoring  Purpose and frequency of SMBG.;Identified appropriate  SMBG and/or A1C goals.;Taught/discussed recording of test results and interpretation of SMBG.    Chronic complications  Relationship between chronic complications and blood glucose control      Individualized Goals (developed by patient)   Monitoring   test my blood glucose as discussed      Patient Self-Evaluation of Goals - Patient rates self as meeting previously set goals (% of time)   Nutrition  < 25%    Physical Activity  < 25%    Medications  25 - 50%    Monitoring  < 25%    Problem Solving  < 25%    Reducing Risk  < 25%    Health Coping  < 25%      Post-Education Assessment   Patient understands the diabetes disease and treatment process.  Needs Review    Patient understands incorporating nutritional management into lifestyle.  Needs Review    Patient undertands incorporating physical activity into lifestyle.  Needs Review    Patient understands using medications safely.  Needs Review    Patient understands monitoring blood glucose, interpreting and using results  Needs Review    Patient  understands prevention, detection, and treatment of acute complications.  Needs Review    Patient understands prevention, detection, and treatment of chronic complications.  Needs Review    Patient understands how to develop strategies to address psychosocial issues.  Needs Review    Patient understands how to develop strategies to promote health/change behavior.  Needs Review      Outcomes   Expected Outcomes  Demonstrated interest in learning. Expect positive outcomes    Future DMSE  2 wks    Program Status  Completed       Individualized Plan for Diabetes Self-Management Training:   Learning Objective:  Patient will have a greater understanding of diabetes self-management. Patient education plan is to attend individual and/or group sessions per assessed needs and concerns.   Plan:  Goals 1. Eat 3 balanced meals per day 2. Drink only water  3. Take medications as prescribed. 4.  Cut out sodas, sweets and junk food. 5. Increase fresh fruits and vegetables. Get A1C down to 9%  Expected Outcomes:  Demonstrated interest in learning. Expect positive outcomes  Education material provided: My Plate  If problems or questions, patient to contact team via:  Phone and Email  Future DSME appointment: 2 wks

## 2017-10-17 NOTE — Patient Instructions (Addendum)
Carrie Turner  10/17/2017     @PREFPERIOPPHARMACY @   Your procedure is scheduled on  10/26/2017 .  Report to [facility] at  1115   A.M.  Call this number if you have problems the morning of surgery:  [phone]   Remember:  Do not eat food or drink liquids after midnight.  Take these medicines the morning of surgery with A SIP OF WATER  Norvasc, celexa, neurontin, lisinopril. Take 35 units of your levemir insulin the night before your procedure. DO NOT take any medications for diabetes the morning of your procedure.   Do not wear jewelry, make-up or nail polish.  Do not wear lotions, powders, or perfumes, or deodorant.  Do not shave 48 hours prior to surgery.  Men may shave face and neck.  Do not bring valuables to the hospital.  [facility] is not responsible for any belongings or valuables.  Contacts, dentures or bridgework may not be worn into surgery.  Leave your suitcase in the car.  After surgery it may be brought to your room.  For patients admitted to the hospital, discharge time will be determined by your treatment team.  Patients discharged the day of surgery will not be allowed to drive home.   Name and phone number of your driver:   family Special instructions:  Follow the diet and prep instructions given to you by Dr Roseanne Kaufman office.  Please read over the following fact sheets that you were given. Anesthesia Post-op Instructions and Care and Recovery After Surgery       Esophagogastroduodenoscopy Esophagogastroduodenoscopy (EGD) is a procedure to examine the lining of the esophagus, stomach, and first part of the small intestine (duodenum). This procedure is done to check for problems such as inflammation, bleeding, ulcers, or growths. During this procedure, a long, flexible, lighted tube with a camera attached (endoscope) is inserted down the throat. Tell a health care provider about:  Any allergies you have.  All medicines you are  taking, including vitamins, herbs, eye drops, creams, and over-the-counter medicines.  Any problems you or family members have had with anesthetic medicines.  Any blood disorders you have.  Any surgeries you have had.  Any medical conditions you have.  Whether you are pregnant or may be pregnant. What are the risks? Generally, this is a safe procedure. However, problems may occur, including:  Infection.  Bleeding.  A tear (perforation) in the esophagus, stomach, or duodenum.  Trouble breathing.  Excessive sweating.  Spasms of the larynx.  A slowed heartbeat.  Low blood pressure.  What happens before the procedure?  Follow instructions from your health care provider about eating or drinking restrictions.  Ask your health care provider about: ? Changing or stopping your regular medicines. This is especially important if you are taking diabetes medicines or blood thinners. ? Taking medicines such as aspirin and ibuprofen. These medicines can thin your blood. Do not take these medicines before your procedure if your health care provider instructs you not to.  Plan to have someone take you home after the procedure.  If you wear dentures, be ready to remove them before the procedure. What happens during the procedure?  To reduce your risk of infection, your health care team will wash or sanitize their hands.  An IV tube will be put in a vein in your hand or arm. You will get medicines and fluids through this tube.  You  will be given one or more of the following: ? A medicine to help you relax (sedative). ? A medicine to numb the area (local anesthetic). This medicine may be sprayed into your throat. It will make you feel more comfortable and keep you from gagging or coughing during the procedure. ? A medicine for pain.  A mouth guard may be placed in your mouth to protect your teeth and to keep you from biting on the endoscope.  You will be asked to lie on your left  side.  The endoscope will be lowered down your throat into your esophagus, stomach, and duodenum.  Air will be put into the endoscope. This will help your health care provider see better.  The lining of your esophagus, stomach, and duodenum will be examined.  Your health care provider may: ? Take a tissue sample so it can be looked at in a lab (biopsy). ? Remove growths. ? Remove objects (foreign bodies) that are stuck. ? Treat any bleeding with medicines or other devices that stop tissue from bleeding. ? Widen (dilate) or stretch narrowed areas of your esophagus and stomach.  The endoscope will be taken out. The procedure may vary among health care providers and hospitals. What happens after the procedure?  Your blood pressure, heart rate, breathing rate, and blood oxygen level will be monitored often until the medicines you were given have worn off.  Do not eat or drink anything until the numbing medicine has worn off and your gag reflex has returned. This information is not intended to replace advice given to you by your health care provider. Make sure you discuss any questions you have with your health care provider. Document Released: 11/11/2004 Document Revised: 12/17/2015 Document Reviewed: 06/04/2015 Elsevier Interactive Patient Education  2018 [employer]. Esophagogastroduodenoscopy, Care After Refer to this sheet in the next few weeks. These instructions provide you with information about caring for yourself after your procedure. Your health care provider may also give you more specific instructions. Your treatment has been planned according to current medical practices, but problems sometimes occur. Call your health care provider if you have any problems or questions after your procedure. What can I expect after the procedure? After the procedure, it is common to have:  A sore throat.  Nausea.  Bloating.  Dizziness.  Fatigue.  Follow these instructions at  home:  Do not eat or drink anything until the numbing medicine (local anesthetic) has worn off and your gag reflex has returned. You will know that the local anesthetic has worn off when you can swallow comfortably.  Do not drive for 24 hours if you received a medicine to help you relax (sedative).  If your health care provider took a tissue sample for testing during the procedure, make sure to get your test results. This is your responsibility. Ask your health care provider or the department performing the test when your results will be ready.  Keep all follow-up visits as told by your health care provider. This is important. Contact a health care provider if:  You cannot stop coughing.  You are not urinating.  You are urinating less than usual. Get help right away if:  You have trouble swallowing.  You cannot eat or drink.  You have throat or chest pain that gets worse.  You are dizzy or light-headed.  You faint.  You have nausea or vomiting.  You have chills.  You have a fever.  You have severe abdominal pain.  You have black,  tarry, or bloody stools. This information is not intended to replace advice given to you by your health care provider. Make sure you discuss any questions you have with your health care provider. Document Released: 06/27/2012 Document Revised: 12/17/2015 Document Reviewed: 06/04/2015 Elsevier Interactive Patient Education  2018 [employer].  Colonoscopy, Adult A colonoscopy is an exam to look at the large intestine. It is done to check for problems, such as:  Lumps (tumors).  Growths (polyps).  Swelling (inflammation).  Bleeding.  What happens before the procedure? Eating and drinking Follow instructions from your doctor about eating and drinking. These instructions may include:  A few days before the procedure - follow a low-fiber diet. ? Avoid nuts. ? Avoid seeds. ? Avoid dried fruit. ? Avoid raw fruits. ? Avoid  vegetables.  1-3 days before the procedure - follow a clear liquid diet. Avoid liquids that have red or purple dye. Drink only clear liquids, such as: ? Clear broth or bouillon. ? Black coffee or tea. ? Clear juice. ? Clear soft drinks or sports drinks. ? Gelatin dessert. ? Popsicles.  On the day of the procedure - do not eat or drink anything during the 2 hours before the procedure.  Bowel prep If you were prescribed an oral bowel prep:  Take it as told by your doctor. Starting the day before your procedure, you will need to drink a lot of liquid. The liquid will cause you to poop (have bowel movements) until your poop is almost clear or light green.  If your skin or butt gets irritated from diarrhea, you may: ? Wipe the area with wipes that have medicine in them, such as adult wet wipes with aloe and vitamin E. ? Put something on your skin that soothes the area, such as petroleum jelly.  If you throw up (vomit) while drinking the bowel prep, take a break for up to 60 minutes. Then begin the bowel prep again. If you keep throwing up and you cannot take the bowel prep without throwing up, call your doctor.  General instructions  Ask your doctor about changing or stopping your normal medicines. This is important if you take diabetes medicines or blood thinners.  Plan to have someone take you home from the hospital or clinic. What happens during the procedure?  An IV tube may be put into one of your veins.  You will be given medicine to help you relax (sedative).  To reduce your risk of infection: ? Your doctors will wash their hands. ? Your anal area will be washed with soap.  You will be asked to lie on your side with your knees bent.  Your doctor will get a long, thin, flexible tube ready. The tube will have a camera and a light on the end.  The tube will be put into your anus.  The tube will be gently put into your large intestine.  Air will be delivered into your  large intestine to keep it open. You may feel some pressure or cramping.  The camera will be used to take photos.  A small tissue sample may be removed from your body to be looked at under a microscope (biopsy). If any possible problems are found, the tissue will be sent to a lab for testing.  If small growths are found, your doctor may remove them and have them checked for cancer.  The tube that was put into your anus will be slowly removed. The procedure may vary among doctors and hospitals.  What happens after the procedure?  Your doctor will check on you often until the medicines you were given have worn off.  Do not drive for 24 hours after the procedure.  You may have a small amount of blood in your poop.  You may pass gas.  You may have mild cramps or bloating in your belly (abdomen).  It is up to you to get the results of your procedure. Ask your doctor, or the department performing the procedure, when your results will be ready. This information is not intended to replace advice given to you by your health care provider. Make sure you discuss any questions you have with your health care provider. Document Released: 08/13/2010 Document Revised: 05/11/2016 Document Reviewed: 09/22/2015 Elsevier Interactive Patient Education  2017 [employer]  Colonoscopy, Adult, Care After This sheet gives you information about how to care for yourself after your procedure. Your health care provider may also give you more specific instructions. If you have problems or questions, contact your health care provider. What can I expect after the procedure? After the procedure, it is common to have:  A small amount of blood in your stool for 24 hours after the procedure.  Some gas.  Mild abdominal cramping or bloating.  Follow these instructions at home: General instructions   For the first 24 hours after the procedure: ? Do not drive or use machinery. ? Do not sign important  documents. ? Do not drink alcohol. ? Do your regular daily activities at a slower pace than normal. ? Eat soft, easy-to-digest foods. ? Rest often.  Take over-the-counter or prescription medicines only as told by your health care provider.  It is up to you to get the results of your procedure. Ask your health care provider, or the department performing the procedure, when your results will be ready. Relieving cramping and bloating  Try walking around when you have cramps or feel bloated.  Apply heat to your abdomen as told by your health care provider. Use a heat source that your health care provider recommends, such as a moist heat pack or a heating pad. ? Place a towel between your skin and the heat source. ? Leave the heat on for 20-30 minutes. ? Remove the heat if your skin turns bright red. This is especially important if you are unable to feel pain, heat, or cold. You may have a greater risk of getting burned. Eating and drinking  Drink enough fluid to keep your urine clear or pale yellow.  Resume your normal diet as instructed by your health care provider. Avoid heavy or fried foods that are hard to digest.  Avoid drinking alcohol for as long as instructed by your health care provider. Contact a health care provider if:  You have blood in your stool 2-3 days after the procedure. Get help right away if:  You have more than a small spotting of blood in your stool.  You pass large blood clots in your stool.  Your abdomen is swollen.  You have nausea or vomiting.  You have a fever.  You have increasing abdominal pain that is not relieved with medicine. This information is not intended to replace advice given to you by your health care provider. Make sure you discuss any questions you have with your health care provider. Document Released: 02/23/2004 Document Revised: 04/04/2016 Document Reviewed: 09/22/2015 Elsevier Interactive Patient Education  2018 [address] Anesthesia is a term that refers to techniques, procedures,  and medicines that help a person stay safe and comfortable during a medical procedure. Monitored anesthesia care, or sedation, is one type of anesthesia. Your anesthesia specialist may recommend sedation if you will be having a procedure that does not require you to be unconscious, such as:  Cataract surgery.  A dental procedure.  A biopsy.  A colonoscopy.  During the procedure, you may receive a medicine to help you relax (sedative). There are three levels of sedation:  Mild sedation. At this level, you may feel awake and relaxed. You will be able to follow directions.  Moderate sedation. At this level, you will be sleepy. You may not remember the procedure.  Deep sedation. At this level, you will be asleep. You will not remember the procedure.  The more medicine you are given, the deeper your level of sedation will be. Depending on how you respond to the procedure, the anesthesia specialist may change your level of sedation or the type of anesthesia to fit your needs. An anesthesia specialist will monitor you closely during the procedure. Let your health care provider know about:  Any allergies you have.  All medicines you are taking, including vitamins, herbs, eye drops, creams, and over-the-counter medicines.  Any use of steroids (by mouth or as a cream).  Any problems you or family members have had with sedatives and anesthetic medicines.  Any blood disorders you have.  Any surgeries you have had.  Any medical conditions you have, such as sleep apnea.  Whether you are pregnant or may be pregnant.  Any use of cigarettes, alcohol, or street drugs. What are the risks? Generally, this is a safe procedure. However, problems may occur, including:  Getting too much medicine (oversedation).  Nausea.  Allergic reaction to medicines.  Trouble breathing. If this happens, a breathing  tube may be used to help with breathing. It will be removed when you are awake and breathing on your own.  Heart trouble.  Lung trouble.  Before the procedure Staying hydrated Follow instructions from your health care provider about hydration, which may include:  Up to 2 hours before the procedure - you may continue to drink clear liquids, such as water, clear fruit juice, black coffee, and plain tea.  Eating and drinking restrictions Follow instructions from your health care provider about eating and drinking, which may include:  8 hours before the procedure - stop eating heavy meals or foods such as meat, fried foods, or fatty foods.  6 hours before the procedure - stop eating light meals or foods, such as toast or cereal.  6 hours before the procedure - stop drinking milk or drinks that contain milk.  2 hours before the procedure - stop drinking clear liquids.  Medicines Ask your health care provider about:  Changing or stopping your regular medicines. This is especially important if you are taking diabetes medicines or blood thinners.  Taking medicines such as aspirin and ibuprofen. These medicines can thin your blood. Do not take these medicines before your procedure if your health care provider instructs you not to.  Tests and exams  You will have a physical exam.  You may have blood tests done to show: ? How well your kidneys and liver are working. ? How well your blood can clot.  General instructions  Plan to have someone take you home from the hospital or clinic.  If you will be going home right after the procedure, plan to have someone with you for 24 hours.  What  happens during the procedure?  Your blood pressure, heart rate, breathing, level of pain and overall condition will be monitored.  An IV tube will be inserted into one of your veins.  Your anesthesia specialist will give you medicines as needed to keep you comfortable during the procedure. This  may mean changing the level of sedation.  The procedure will be performed. After the procedure  Your blood pressure, heart rate, breathing rate, and blood oxygen level will be monitored until the medicines you were given have worn off.  Do not drive for 24 hours if you received a sedative.  You may: ? Feel sleepy, clumsy, or nauseous. ? Feel forgetful about what happened after the procedure. ? Have a sore throat if you had a breathing tube during the procedure. ? Vomit. This information is not intended to replace advice given to you by your health care provider. Make sure you discuss any questions you have with your health care provider. Document Released: 04/06/2005 Document Revised: 12/18/2015 Document Reviewed: 11/01/2015 Elsevier Interactive Patient Education  2018 [place], Care After These instructions provide you with information about caring for yourself after your procedure. Your health care provider may also give you more specific instructions. Your treatment has been planned according to current medical practices, but problems sometimes occur. Call your health care provider if you have any problems or questions after your procedure. What can I expect after the procedure? After your procedure, it is common to:  Feel sleepy for several hours.  Feel clumsy and have poor balance for several hours.  Feel forgetful about what happened after the procedure.  Have poor judgment for several hours.  Feel nauseous or vomit.  Have a sore throat if you had a breathing tube during the procedure.  Follow these instructions at home: For at least 24 hours after the procedure:   Do not: ? Participate in activities in which you could fall or become injured. ? Drive. ? Use heavy machinery. ? Drink alcohol. ? Take sleeping pills or medicines that cause drowsiness. ? Make important decisions or sign legal documents. ? Take care of children on your  own.  Rest. Eating and drinking  Follow the diet that is recommended by your health care provider.  If you vomit, drink water, juice, or soup when you can drink without vomiting.  Make sure you have little or no nausea before eating solid foods. General instructions  Have a responsible adult stay with you until you are awake and alert.  Take over-the-counter and prescription medicines only as told by your health care provider.  If you smoke, do not smoke without supervision.  Keep all follow-up visits as told by your health care provider. This is important. Contact a health care provider if:  You keep feeling nauseous or you keep vomiting.  You feel light-headed.  You develop a rash.  You have a fever. Get help right away if:  You have trouble breathing. This information is not intended to replace advice given to you by your health care provider. Make sure you discuss any questions you have with your health care provider. Document Released: 11/01/2015 Document Revised: 03/02/2016 Document Reviewed: 11/01/2015 Elsevier Interactive Patient Education  [employer].

## 2017-10-18 ENCOUNTER — Ambulatory Visit (INDEPENDENT_AMBULATORY_CARE_PROVIDER_SITE_OTHER): Payer: Medicare Other | Admitting: "Endocrinology

## 2017-10-18 ENCOUNTER — Encounter: Payer: Medicare Other | Attending: Internal Medicine | Admitting: Nutrition

## 2017-10-18 ENCOUNTER — Encounter: Payer: Self-pay | Admitting: Nutrition

## 2017-10-18 ENCOUNTER — Encounter: Payer: Self-pay | Admitting: "Endocrinology

## 2017-10-18 ENCOUNTER — Other Ambulatory Visit: Payer: Self-pay | Admitting: "Endocrinology

## 2017-10-18 VITALS — Ht 62.0 in | Wt 211.0 lb

## 2017-10-18 VITALS — BP 150/91 | HR 91 | Ht 62.0 in | Wt 211.0 lb

## 2017-10-18 DIAGNOSIS — E1165 Type 2 diabetes mellitus with hyperglycemia: Secondary | ICD-10-CM | POA: Insufficient documentation

## 2017-10-18 DIAGNOSIS — E782 Mixed hyperlipidemia: Secondary | ICD-10-CM | POA: Diagnosis not present

## 2017-10-18 DIAGNOSIS — E118 Type 2 diabetes mellitus with unspecified complications: Secondary | ICD-10-CM | POA: Diagnosis not present

## 2017-10-18 DIAGNOSIS — E6609 Other obesity due to excess calories: Secondary | ICD-10-CM | POA: Diagnosis not present

## 2017-10-18 DIAGNOSIS — Z713 Dietary counseling and surveillance: Secondary | ICD-10-CM | POA: Insufficient documentation

## 2017-10-18 DIAGNOSIS — I10 Essential (primary) hypertension: Secondary | ICD-10-CM | POA: Diagnosis not present

## 2017-10-18 DIAGNOSIS — Z6834 Body mass index (BMI) 34.0-34.9, adult: Secondary | ICD-10-CM

## 2017-10-18 DIAGNOSIS — IMO0002 Reserved for concepts with insufficient information to code with codable children: Secondary | ICD-10-CM

## 2017-10-18 DIAGNOSIS — Z794 Long term (current) use of insulin: Secondary | ICD-10-CM

## 2017-10-18 MED ORDER — INSULIN LISPRO 100 UNIT/ML (KWIKPEN): 15.0000 [IU] | PEN_INJECTOR | Freq: Three times a day (TID) | SUBCUTANEOUS | 2 refills | Status: DC

## 2017-10-18 MED ORDER — INSULIN DETEMIR 100 UNIT/ML FLEXPEN: 80.0000 [IU] | PEN_INJECTOR | Freq: Every day | SUBCUTANEOUS | 2 refills | Status: DC

## 2017-10-18 MED ORDER — FREESTYLE LIBRE 14 DAY SENSOR MISC: 1.0000 | 2 refills | Status: DC

## 2017-10-18 MED ORDER — FREESTYLE LIBRE 14 DAY READER DEVI: 1.0000 | Freq: Once | 0 refills | Status: AC

## 2017-10-18 NOTE — Patient Instructions (Addendum)
Goals 1. Eat 2-3 carb choices per meal. 2. Walk 15-30 minutes a day or 2500 steps. 3. Keep drinking water 4. Avoid snacks between meals.

## 2017-10-18 NOTE — Patient Instructions (Signed)

## 2017-10-18 NOTE — Progress Notes (Signed)
Diabetes Self-Management Education  Visit Type: Follow-up  Appt. Start Time: 1515 Appt. End Time: 4315  10/18/2017  Carrie Turner, identified by name and date of birth, is a 67 y.o. female with a diagnosis of Diabetes:    She has been doing better she notes with her diet and testing blood sugars. Started walkign a llittle. Drinking more water. Not skipping meals. Taking her insulin as prescribed. Gained 2 lbs.  BS still running 180-mid 250's. Dr. Dorris Fetch increase Levemir to 80 units a day and bolus insulin the same. Making progress with diet and exercise.  Lab Results  Component Value Date   HGBA1C 10.5 09/20/2017      ASSESSMENT  Height 5\' 2"  (1.575 m), weight 211 lb (95.7 kg). Body mass index is 38.59 kg/m.  Diabetes Self-Management Education - 10/18/17 1421      Visit Information   Visit Type  Follow-up      Health Coping   How would you rate your overall health?  Fair      Pre-Education Assessment   Patient understands the diabetes disease and treatment process.  Needs Review    Patient understands incorporating nutritional management into lifestyle.  Needs Review    Patient undertands incorporating physical activity into lifestyle.  Needs Review    Patient understands using medications safely.  Needs Review    Patient understands monitoring blood glucose, interpreting and using results  Needs Review    Patient understands prevention, detection, and treatment of acute complications.  Needs Review    Patient understands prevention, detection, and treatment of chronic complications.  Needs Review    Patient understands how to develop strategies to address psychosocial issues.  Needs Review    Patient understands how to develop strategies to promote health/change behavior.  Needs Review      Complications   Last HgB A1C per patient/outside source  10.2   How often do you check your blood sugar?  3-4 times/day    Fasting Blood glucose range (mg/dL)  180-200    Postprandial Blood glucose range (mg/dL)  180-200    Number of hypoglycemic episodes per month  0    Number of hyperglycemic episodes per week  15    Can you tell when your blood sugar is high?  No    Have you had a dilated eye exam in the past 12 months?  Yes    Have you had a dental exam in the past 12 months?  No    Are you checking your feet?  Yes    How many days per week are you checking your feet?  7      Dietary Intake   Breakfast  Oatmeal, boiled egg, coffe    Lunch  Grilled Chicken, vegetables, water    Dinner  TV Healthy CHoice TV dinner and 1 slice bread    Beverage(s)  water      Exercise   Exercise Type  ADL's      Patient Education   Nutrition management   Role of diet in the treatment of diabetes and the relationship between the three main macronutrients and blood glucose level;Carbohydrate counting;Meal timing in regards to the patients' current diabetes medication.;Reviewed blood glucose goals for pre and post meals and how to evaluate the patients' food intake on their blood glucose level.    Physical activity and exercise   Role of exercise on diabetes management, blood pressure control and cardiac health.    Monitoring  Purpose and frequency of  SMBG.    Chronic complications  Relationship between chronic complications and blood glucose control;Dental care;Lipid levels, blood glucose control and heart disease      Individualized Goals (developed by patient)   Physical Activity  Exercise 3-5 times per week;15 minutes per day    Medications  take my medication as prescribed    Monitoring   test my blood glucose as discussed    Reducing Risk  examine blood glucose patterns;do foot checks daily      Patient Self-Evaluation of Goals - Patient rates self as meeting previously set goals (% of time)   Nutrition  50 - 75 %    Physical Activity  25 - 50%    Medications  >75%    Monitoring  >75%    Problem Solving  25 - 50%    Reducing Risk  25 - 50%    Health Coping  25  - 50%      Post-Education Assessment   Patient understands incorporating nutritional management into lifestyle.  Needs Review    Patient undertands incorporating physical activity into lifestyle.  Needs Review      Outcomes   Expected Outcomes  Demonstrated interest in learning. Expect positive outcomes    Future DMSE  3-4 months    Program Status  Completed      Subsequent Visit   Since your last visit have you continued or begun to take your medications as prescribed?  Yes    Since your last visit have you had your blood pressure checked?  Yes    Is your most recent blood pressure lower, unchanged, or higher since your last visit?  Unchanged    Since your last visit have you experienced any weight changes?  Gain    Weight Gain (lbs)  2    Since your last visit, are you checking your blood glucose at least once a day?  Yes       Individualized Plan for Diabetes Self-Management Training:   Learning Objective:  Patient will have a greater understanding of diabetes self-management. Patient education plan is to attend individual and/or group sessions per assessed needs and concerns. Goals 1. Eat 2-3 carb choices per meal. 2. Walk 15-30 minutes a day or 2500 steps. 3. Keep drinking water 4. Avoid snacks between meals  Expected Outcomes:  Demonstrated interest in learning. Expect positive outcomes  Education material provided: My Plate  If problems or questions, patient to contact team via:  Phone and Email  Future DSME appointment: 3-4 months

## 2017-10-18 NOTE — Progress Notes (Signed)
Subjective:    Patient ID: Carrie Turner, female    DOB: 1951/05/10. Patient is being seen in follow-up for management of diabetes requested by  Monico Blitz, MD  Past Medical History:  Diagnosis Date  . Anxiety   . COPD (chronic obstructive pulmonary disease) ([place])   . Depression   . Diabetes mellitus, type II ([place])   . GERD (gastroesophageal reflux disease)   . Hyperlipidemia   . Hypertension   . Migraines   . Neuropathy   . Vitamin D deficiency    Past Surgical History:  Procedure Laterality Date  . ABDOMINAL HYSTERECTOMY    . COLONOSCOPY  11/2015   Dr. Britta Mccreedy: sessile polyp removed (benign). advised repeat colonoscopy in 5 years.   . ESOPHAGOGASTRODUODENOSCOPY  11/2015   Dr. Britta Mccreedy: hiatal hernia  . KNEE ARTHROSCOPY Right   . ORIF ANKLE FRACTURE Right    Social History   Socioeconomic History  . Marital status: Single    Spouse name: Not on file  . Number of children: Not on file  . Years of education: Not on file  . Highest education level: Not on file  Occupational History  . Not on file  Social Needs  . Financial resource strain: Not on file  . Food insecurity:    Worry: Not on file    Inability: Not on file  . Transportation needs:    Medical: Not on file    Non-medical: Not on file  Tobacco Use  . Smoking status: Never Smoker  . Smokeless tobacco: Never Used  Substance and Sexual Activity  . Alcohol use: No  . Drug use: No  . Sexual activity: Never  Lifestyle  . Physical activity:    Days per week: Not on file    Minutes per session: Not on file  . Stress: Not on file  Relationships  . Social connections:    Talks on phone: Not on file    Gets together: Not on file    Attends religious service: Not on file    Active member of club or organization: Not on file    Attends meetings of clubs or organizations: Not on file    Relationship status: Not on file  Other Topics Concern  . Not on file  Social History Narrative  . Not on file    Outpatient Encounter Medications as of 10/18/2017  Medication Sig  . amLODipine (NORVASC) 2.5 MG tablet Take 2.5 mg by mouth daily.  . Cholecalciferol (VITAMIN D3) 2000 units TABS Take 2,000 Units by mouth daily.  . citalopram (CELEXA) 40 MG tablet Take 40 mg by mouth daily.  . Continuous Blood Gluc Receiver (FREESTYLE LIBRE 14 DAY READER) DEVI 1 each by Does not apply route once for 1 dose.  . Continuous Blood Gluc Sensor (FREESTYLE LIBRE 14 DAY SENSOR) MISC Inject 1 each into the skin every 14 (fourteen) days. Use as directed.  . docusate sodium (COLACE) 100 MG capsule Take 100 mg by mouth 2 (two) times daily as needed for mild constipation.  . Ferrous Gluconate (IRON 27 PO) Take 1 tablet by mouth daily.  Marland Kitchen gabapentin (NEURONTIN) 800 MG tablet Take 800 mg by mouth 3 (three) times daily.  Marland Kitchen ibuprofen (ADVIL,MOTRIN) 200 MG tablet Take 400 mg by mouth every 8 (eight) hours as needed (for headaches.).   . Insulin Detemir (LEVEMIR FLEXTOUCH) 100 UNIT/ML Pen Inject 80 Units into the skin at bedtime.  . insulin lispro (HUMALOG KWIKPEN) 100 UNIT/ML KiwkPen Inject 0.15-0.21  mLs (15-21 Units total) into the skin 3 (three) times daily.  Marland Kitchen latanoprost (XALATAN) 0.005 % ophthalmic solution Place 1 drop into both eyes at bedtime.  Marland Kitchen lisinopril-hydrochlorothiazide (PRINZIDE,ZESTORETIC) 20-12.5 MG tablet Take 1 tablet by mouth 2 (two) times daily.  Glory Rosebush VERIO test strip USE 1 STRIP TO CHECK GLUCOSE 4 TIMES DAILY  . pantoprazole (PROTONIX) 40 MG tablet Take 40 mg by mouth 2 (two) times daily.   . polyethylene glycol-electrolytes (TRILYTE) 420 g solution Take 4,000 mLs by mouth as directed.  . pravastatin (PRAVACHOL) 40 MG tablet Take 40 mg by mouth daily.  Marland Kitchen pyridOXINE (VITAMIN B-6) 100 MG tablet Take 100 mg by mouth daily.  . ranitidine (ZANTAC) 150 MG capsule Take 150 mg by mouth every evening.  . Suvorexant (BELSOMRA) 20 MG TABS Take 20 mg by mouth at bedtime.   . vitamin B-12 (CYANOCOBALAMIN) 500  MCG tablet Take 500 mcg by mouth daily.  . [DISCONTINUED] Insulin Detemir (LEVEMIR FLEXTOUCH) 100 UNIT/ML Pen Inject 70 Units into the skin at bedtime.  . [DISCONTINUED] insulin lispro (HUMALOG KWIKPEN) 100 UNIT/ML KiwkPen Inject 0.15-0.21 mLs (15-21 Units total) into the skin 3 (three) times daily. (Patient taking differently: Inject 15-21 Units into the skin 3 (three) times daily. Sliding Scale Insulin)   No facility-administered encounter medications on file as of 10/18/2017.    ALLERGIES: Allergies  Allergen Reactions  . Sulfa Antibiotics    VACCINATION STATUS:  There is no immunization history on file for this patient.  Diabetes  She presents for her follow-up diabetic visit. She has type 2 diabetes mellitus. Onset time: She was diagnosed at approximate age of 18 years. Her disease course has been improving. There are no hypoglycemic associated symptoms. Pertinent negatives for hypoglycemia include no confusion, headaches, pallor or seizures. Associated symptoms include fatigue, polydipsia and polyuria. Pertinent negatives for diabetes include no blurred vision, no chest pain and no polyphagia. There are no hypoglycemic complications. Symptoms are improving. There are no diabetic complications. Risk factors for coronary artery disease include diabetes mellitus, dyslipidemia, obesity and sedentary lifestyle. Current diabetic treatment includes insulin injections (She did not freeze her insulin dose as recommended during her last visit.). She is compliant with treatment some of the time. Her weight is stable. She is following a generally unhealthy diet. When asked about meal planning, she reported none. She has had a previous visit with a dietitian (She will see the dietitian today.). She never participates in exercise. Her home blood glucose trend is decreasing steadily. Her breakfast blood glucose range is generally >200 mg/dl. Her lunch blood glucose range is generally 180-200 mg/dl. Her dinner  blood glucose range is generally >200 mg/dl. Her bedtime blood glucose range is generally >200 mg/dl. Her overall blood glucose range is >200 mg/dl. (She cannot confirm if she has been taking the insulin prescribed for her last visit.  Her A1c is higher at 10.5%.  She did not bring any meter nor logs for recent months.) An ACE inhibitor/angiotensin II receptor blocker is not being taken. Eye exam is current.  Hyperlipidemia  This is a chronic problem. The current episode started more than 1 year ago. Recent lipid tests were reviewed and are high. Exacerbating diseases include diabetes and obesity. Pertinent negatives include no chest pain, myalgias or shortness of breath. She is currently on no antihyperlipidemic treatment. Compliance problems include adherence to diet and adherence to exercise.  Risk factors for coronary artery disease include diabetes mellitus, dyslipidemia, hypertension, obesity and a sedentary lifestyle.  Hypertension  This is a chronic problem. The current episode started more than 1 year ago. The problem is controlled. Pertinent negatives include no blurred vision, chest pain, headaches, palpitations or shortness of breath. Risk factors for coronary artery disease include diabetes mellitus, dyslipidemia and sedentary lifestyle. Past treatments include calcium channel blockers. Compliance problems include diet and exercise.     Review of Systems  Constitutional: Positive for fatigue. Negative for unexpected weight change.  HENT: Negative for trouble swallowing and voice change.   Eyes: Negative for blurred vision and visual disturbance.  Respiratory: Negative for cough, shortness of breath and wheezing.   Cardiovascular: Negative for chest pain, palpitations and leg swelling.  Gastrointestinal: Negative for diarrhea, nausea and vomiting.  Endocrine: Positive for polydipsia and polyuria. Negative for cold intolerance, heat intolerance and polyphagia.  Genitourinary: Positive for  frequency. Negative for dysuria and flank pain.  Musculoskeletal: Negative for arthralgias and myalgias.  Skin: Negative for color change, pallor, rash and wound.  Neurological: Negative for seizures and headaches.  Psychiatric/Behavioral: Negative for confusion and suicidal ideas.    Objective:    BP (!) 150/91   Pulse 91   Ht 5\' 2"  (1.575 m)   Wt 211 lb (95.7 kg)   BMI 38.59 kg/m   Wt Readings from Last 3 Encounters:  10/18/17 211 lb (95.7 kg)  10/18/17 211 lb (95.7 kg)  10/11/17 209 lb (94.8 kg)    Physical Exam  Constitutional: She is oriented to person, place, and time. She appears well-developed.  HENT:  Head: Normocephalic and atraumatic.  Eyes: EOM are normal.  Neck: Normal range of motion. Neck supple. No tracheal deviation present. No thyromegaly present.  Cardiovascular: Normal rate.  Pulmonary/Chest: Effort normal.  Abdominal: Bowel sounds are normal. There is no tenderness. There is no guarding.  Musculoskeletal: Normal range of motion. She exhibits no edema.  Neurological: She is alert and oriented to person, place, and time. She has normal reflexes. No cranial nerve deficit. Coordination normal.  Skin: Skin is warm and dry. No rash noted. No erythema. No pallor.  Psychiatric: She has a normal mood and affect. Judgment normal.   Labs: September 20, 2017 A1c 10.5% 05/22/2017 show A1c of 9.5% 02/22/2017 show A1c of 9.5% 08/08/2016 show A1c of 9.6%. 04/20/2016: Her A1c was 11.4% A1c was 10.3% on 01/12/2016,  vitamin D low at 20.8 on 09/25/2015, liver function test was abnormal with alkaline phosphatase 147 (normal 39-1 17), AST 62, ALT 66. Labs from 05/22/2017 and also showing normal renal function, normal CMP including transaminases.  Recent Results (from the past 2160 hour(s))  Basic metabolic panel     Status: None   Collection Time: 09/20/17 12:00 AM  Result Value Ref Range   BUN 16 4 - 21   Creatinine 0.5 0.5 - 1.1  Hemoglobin A1c     Status: None    Collection Time: 09/20/17 12:00 AM  Result Value Ref Range   Hemoglobin A1C 10.5      Assessment & Plan:   1. Uncontrolled type 2 diabetes mellitus with complication, with long-term current use of insulin ([place])  - Patient has currently uncontrolled symptomatic type 2 DM since  67 years of age. -Returns with her logs showing still consistently above target blood glucose readings however slightly improving.    - Her most recent labs Show A1c of 10.5% worsening from 9.5%. -Recent labs reviewed, showing normal renal function, transaminases back to normal.   Her diabetes is complicated by obesity and sedentary life  and patient remains at a high risk for more acute and chronic complications of diabetes which include CAD, CVA, CKD, retinopathy, and neuropathy. These are all discussed in detail with the patient.  - I have counseled the patient on diet management and weight loss, by adopting a carbohydrate restricted/protein rich diet.   -  Suggestion is made for her to avoid simple carbohydrates  from her diet including Cakes, Sweet Desserts / Pastries, Ice Cream, Soda (diet and regular), Sweet Tea, Candies, Chips, Cookies, Store Bought Juices, Alcohol in Excess of  1-2 drinks a day, Artificial Sweeteners, and "Sugar-free" Products. This will help patient to have stable blood glucose profile and potentially avoid unintended weight gain.  - I encouraged the patient to switch to  unprocessed or minimally processed complex starch and increased protein intake (animal or plant source), fruits, and vegetables.  - Patient is advised to stick to a routine mealtimes to eat 3 meals  a day and avoid unnecessary snacks ( to snack only to correct hypoglycemia).   - I have approached patient with the following individualized plan to manage diabetes and patient agrees:   -She returns with he logs showing persistently above target blood glucose readings.    -She is approached to you with intensive treatment  with basal/bolus insulin.   -I will proceed to increase her Levemir to 80  units nightly, NovoLog 15 units 3 times daily before meals for pre-meal BG readings of 90-150mg /dl, plus patient specific correction dose for unexpected hyperglycemia above 150mg /dl, associated with strict monitoring of glucose 4 times a day-before meals and at bedtime. - Patient is warned not to take insulin without proper monitoring per orders. -Adjustment parameters are given for hypo and hyperglycemia in writing. -Patient is encouraged to call clinic for blood glucose levels less than 70 or above 300 mg /dl. -She did not tolerate the low-dose metformin retry.  I advised her to discontinue.   - Patient will be considered for incretin therapy as appropriate next visit. - Patient specific target  A1c;  LDL, HDL, Triglycerides, and  Waist Circumference were discussed in detail.  2) BP/HTN: Her blood pressure is not controlled to target.     She is advised to take her blood pressure medications always in the morning. She will be considered for ACE inhibitor/ARB next visit.   3) Lipids/HPL:  Control unknown, I will obtain fasting lipid panel on subsequent visits. She is not on statins likely due to a concern of elevated transaminases. -However she will be reassessed for utility of statins after her next labs. 4)  obesity: CDE Consult in progress , exercise, and detailed carbohydrates information provided.  5) Chronic Care/Health Maintenance:  -Patient is encouraged to continue to follow up with Ophthalmology, Podiatrist at least yearly or according to recommendations, and advised to   stay away from smoking. I have recommended yearly flu vaccine and pneumonia vaccination at least every 5 years; moderate intensity exercise for up to 150 minutes weekly; and  sleep for at least 7 hours a day.  - I advised patient to maintain close follow up with Monico Blitz, MD for primary care needs.  - Time spent with the patient: 25 min,  of which >50% was spent in reviewing her blood glucose logs , discussing her hypo- and hyper-glycemic episodes, reviewing her current and  previous labs and insulin doses and developing a plan to avoid hypo- and hyper-glycemia. Please refer to Patient Instructions for Blood Glucose Monitoring and Insulin/Medications Dosing Guide"  in  media tab for additional information. Orlena Sheldon participated in the discussions, expressed understanding, and voiced agreement with the above plans.  All questions were answered to her satisfaction. she is encouraged to contact clinic should she have any questions or concerns prior to her return visit.   Follow up plan: - Return in about 3 months (around 01/18/2018) for meter, and logs.  Glade Lloyd, MD Phone: [phone]  Fax: [phone]  -  This note was partially dictated with voice recognition software. Similar sounding words can be transcribed inadequately or may not  be corrected upon review.  10/18/2017, 4:42 PM

## 2017-10-19 ENCOUNTER — Encounter (HOSPITAL_COMMUNITY)
Admission: RE | Admit: 2017-10-19 | Discharge: 2017-10-19 | Disposition: A | Discharge status: Home / Self Care | Payer: Medicare Other | Source: Ambulatory Visit | Attending: Internal Medicine | Admitting: Internal Medicine

## 2017-10-19 ENCOUNTER — Encounter (HOSPITAL_COMMUNITY): Payer: Self-pay

## 2017-10-19 ENCOUNTER — Other Ambulatory Visit: Payer: Self-pay

## 2017-10-19 DIAGNOSIS — Z01810 Encounter for preprocedural cardiovascular examination: Secondary | ICD-10-CM | POA: Insufficient documentation

## 2017-10-19 DIAGNOSIS — D509 Iron deficiency anemia, unspecified: Secondary | ICD-10-CM | POA: Insufficient documentation

## 2017-10-19 DIAGNOSIS — R197 Diarrhea, unspecified: Secondary | ICD-10-CM | POA: Insufficient documentation

## 2017-10-19 DIAGNOSIS — Z01818 Encounter for other preprocedural examination: Secondary | ICD-10-CM | POA: Diagnosis not present

## 2017-10-19 DIAGNOSIS — D72829 Elevated white blood cell count, unspecified: Secondary | ICD-10-CM | POA: Insufficient documentation

## 2017-10-19 DIAGNOSIS — K625 Hemorrhage of anus and rectum: Secondary | ICD-10-CM | POA: Insufficient documentation

## 2017-10-19 DIAGNOSIS — I4510 Unspecified right bundle-branch block: Secondary | ICD-10-CM | POA: Diagnosis not present

## 2017-10-19 HISTORY — DX: Unspecified convulsions: R56.9

## 2017-10-19 HISTORY — DX: Unspecified glaucoma: H40.9

## 2017-10-19 LAB — CBC WITH DIFFERENTIAL/PLATELET
BASOS ABS: 0.1 10*3/uL (ref 0.0–0.1)
Basophils Relative: 1 %
Eosinophils Absolute: 0.3 10*3/uL (ref 0.0–0.7)
Eosinophils Relative: 3 %
HEMATOCRIT: 42.6 % (ref 36.0–46.0)
HEMOGLOBIN: 13.2 g/dL (ref 12.0–15.0)
LYMPHS PCT: 32 %
Lymphs Abs: 3.4 10*3/uL (ref 0.7–4.0)
MCH: 30 pg (ref 26.0–34.0)
MCHC: 31 g/dL (ref 30.0–36.0)
MCV: 96.8 fL (ref 78.0–100.0)
MONO ABS: 0.8 10*3/uL (ref 0.1–1.0)
Monocytes Relative: 7 %
NEUTROS ABS: 6.2 10*3/uL (ref 1.7–7.7)
Neutrophils Relative %: 57 %
Platelets: 447 10*3/uL — ABNORMAL HIGH (ref 150–400)
RBC: 4.4 MIL/uL (ref 3.87–5.11)
RDW: 12.5 % (ref 11.5–15.5)
WBC: 10.7 10*3/uL — AB (ref 4.0–10.5)

## 2017-10-19 LAB — BASIC METABOLIC PANEL
ANION GAP: 13 (ref 5–15)
BUN: 19 mg/dL (ref 6–20)
CHLORIDE: 102 mmol/L (ref 101–111)
CO2: 25 mmol/L (ref 22–32)
Calcium: 9.2 mg/dL (ref 8.9–10.3)
Creatinine, Ser: 0.61 mg/dL (ref 0.44–1.00)
GFR calc non Af Amer: >60 mL/min (ref >60)
Glucose, Bld: 256 mg/dL — ABNORMAL HIGH (ref 65–99)
Potassium: 3.8 mmol/L (ref 3.5–5.1)
Sodium: 140 mmol/L (ref 135–145)

## 2017-10-25 ENCOUNTER — Other Ambulatory Visit: Payer: Self-pay

## 2017-10-26 ENCOUNTER — Ambulatory Visit (HOSPITAL_COMMUNITY): Payer: Medicare Other | Admitting: Anesthesiology

## 2017-10-26 ENCOUNTER — Encounter (HOSPITAL_COMMUNITY): Payer: Self-pay | Admitting: *Deleted

## 2017-10-26 ENCOUNTER — Ambulatory Visit (HOSPITAL_COMMUNITY)
Admission: RE | Admit: 2017-10-26 | Discharge: 2017-10-26 | Disposition: A | Discharge status: Home / Self Care | Payer: Medicare Other | Source: Ambulatory Visit | Attending: Internal Medicine | Admitting: Internal Medicine

## 2017-10-26 ENCOUNTER — Other Ambulatory Visit: Payer: Self-pay

## 2017-10-26 ENCOUNTER — Encounter (HOSPITAL_COMMUNITY)
Admission: RE | Disposition: A | Discharge status: Home / Self Care | Payer: Self-pay | Source: Ambulatory Visit | Attending: Internal Medicine

## 2017-10-26 DIAGNOSIS — Z79899 Other long term (current) drug therapy: Secondary | ICD-10-CM | POA: Diagnosis not present

## 2017-10-26 DIAGNOSIS — E559 Vitamin D deficiency, unspecified: Secondary | ICD-10-CM | POA: Diagnosis not present

## 2017-10-26 DIAGNOSIS — K449 Diaphragmatic hernia without obstruction or gangrene: Secondary | ICD-10-CM | POA: Diagnosis not present

## 2017-10-26 DIAGNOSIS — Z823 Family history of stroke: Secondary | ICD-10-CM | POA: Diagnosis not present

## 2017-10-26 DIAGNOSIS — R1314 Dysphagia, pharyngoesophageal phase: Secondary | ICD-10-CM | POA: Diagnosis not present

## 2017-10-26 DIAGNOSIS — Z809 Family history of malignant neoplasm, unspecified: Secondary | ICD-10-CM | POA: Diagnosis not present

## 2017-10-26 DIAGNOSIS — D123 Benign neoplasm of transverse colon: Secondary | ICD-10-CM | POA: Diagnosis not present

## 2017-10-26 DIAGNOSIS — E785 Hyperlipidemia, unspecified: Secondary | ICD-10-CM | POA: Diagnosis not present

## 2017-10-26 DIAGNOSIS — G43909 Migraine, unspecified, not intractable, without status migrainosus: Secondary | ICD-10-CM | POA: Insufficient documentation

## 2017-10-26 DIAGNOSIS — D649 Anemia, unspecified: Secondary | ICD-10-CM | POA: Diagnosis not present

## 2017-10-26 DIAGNOSIS — K210 Gastro-esophageal reflux disease with esophagitis: Secondary | ICD-10-CM | POA: Insufficient documentation

## 2017-10-26 DIAGNOSIS — K209 Esophagitis, unspecified: Secondary | ICD-10-CM

## 2017-10-26 DIAGNOSIS — Z811 Family history of alcohol abuse and dependence: Secondary | ICD-10-CM | POA: Insufficient documentation

## 2017-10-26 DIAGNOSIS — I10 Essential (primary) hypertension: Secondary | ICD-10-CM | POA: Diagnosis not present

## 2017-10-26 DIAGNOSIS — F419 Anxiety disorder, unspecified: Secondary | ICD-10-CM | POA: Insufficient documentation

## 2017-10-26 DIAGNOSIS — K640 First degree hemorrhoids: Secondary | ICD-10-CM | POA: Diagnosis not present

## 2017-10-26 DIAGNOSIS — K529 Noninfective gastroenteritis and colitis, unspecified: Secondary | ICD-10-CM | POA: Diagnosis not present

## 2017-10-26 DIAGNOSIS — Z8249 Family history of ischemic heart disease and other diseases of the circulatory system: Secondary | ICD-10-CM | POA: Diagnosis not present

## 2017-10-26 DIAGNOSIS — H409 Unspecified glaucoma: Secondary | ICD-10-CM | POA: Diagnosis not present

## 2017-10-26 DIAGNOSIS — R1310 Dysphagia, unspecified: Secondary | ICD-10-CM | POA: Diagnosis not present

## 2017-10-26 DIAGNOSIS — Z800 Family history of malignant neoplasm of digestive organs: Secondary | ICD-10-CM | POA: Diagnosis not present

## 2017-10-26 DIAGNOSIS — E1140 Type 2 diabetes mellitus with diabetic neuropathy, unspecified: Secondary | ICD-10-CM | POA: Insufficient documentation

## 2017-10-26 DIAGNOSIS — F329 Major depressive disorder, single episode, unspecified: Secondary | ICD-10-CM | POA: Diagnosis not present

## 2017-10-26 DIAGNOSIS — J449 Chronic obstructive pulmonary disease, unspecified: Secondary | ICD-10-CM | POA: Diagnosis not present

## 2017-10-26 DIAGNOSIS — Z90710 Acquired absence of both cervix and uterus: Secondary | ICD-10-CM | POA: Insufficient documentation

## 2017-10-26 DIAGNOSIS — K219 Gastro-esophageal reflux disease without esophagitis: Secondary | ICD-10-CM

## 2017-10-26 DIAGNOSIS — R1013 Epigastric pain: Secondary | ICD-10-CM

## 2017-10-26 DIAGNOSIS — K625 Hemorrhage of anus and rectum: Secondary | ICD-10-CM

## 2017-10-26 DIAGNOSIS — R110 Nausea: Secondary | ICD-10-CM

## 2017-10-26 DIAGNOSIS — D473 Essential (hemorrhagic) thrombocythemia: Secondary | ICD-10-CM

## 2017-10-26 DIAGNOSIS — Z794 Long term (current) use of insulin: Secondary | ICD-10-CM | POA: Diagnosis not present

## 2017-10-26 DIAGNOSIS — D75839 Thrombocytosis, unspecified: Secondary | ICD-10-CM

## 2017-10-26 HISTORY — PX: ESOPHAGOGASTRODUODENOSCOPY (EGD) WITH PROPOFOL: SHX5813

## 2017-10-26 HISTORY — PX: COLONOSCOPY WITH PROPOFOL: SHX5780

## 2017-10-26 HISTORY — PX: ESOPHAGEAL DILATION: SHX303

## 2017-10-26 LAB — GLUCOSE, CAPILLARY: Glucose-Capillary: 130 mg/dL — ABNORMAL HIGH (ref 65–99)

## 2017-10-26 SURGERY — COLONOSCOPY WITH PROPOFOL
Anesthesia: Monitor Anesthesia Care

## 2017-10-26 MED ORDER — CHLORHEXIDINE GLUCONATE CLOTH 2 % EX PADS: 6.0000 | MEDICATED_PAD | Freq: Once | CUTANEOUS | Status: DC

## 2017-10-26 MED ORDER — PROPOFOL 500 MG/50ML IV EMUL
INTRAVENOUS | Status: DC | PRN
Start: 2017-10-26 — End: 2017-10-26
  Administered 2017-10-26 (×2): 150 ug/kg/min via INTRAVENOUS
  Administered 2017-10-26: 100 ug/kg/min via INTRAVENOUS

## 2017-10-26 MED ORDER — STERILE WATER FOR IRRIGATION IR SOLN
Status: DC | PRN
  Administered 2017-10-26: 15 mL

## 2017-10-26 MED ORDER — LIDOCAINE VISCOUS 2 % MT SOLN
5.0000 mL | Freq: Two times a day (BID) | OROMUCOSAL | Status: AC
  Administered 2017-10-26 (×2): 5 mL via OROMUCOSAL

## 2017-10-26 MED ORDER — PROPOFOL 10 MG/ML IV BOLUS
INTRAVENOUS | Status: DC | PRN
  Administered 2017-10-26: 40 mg via INTRAVENOUS
  Administered 2017-10-26: 20 mg via INTRAVENOUS
  Administered 2017-10-26: 40 mg via INTRAVENOUS
  Administered 2017-10-26: 20 mg via INTRAVENOUS

## 2017-10-26 MED ORDER — LACTATED RINGERS IV SOLN
INTRAVENOUS | Status: DC
  Administered 2017-10-26: 1000 mL via INTRAVENOUS

## 2017-10-26 MED ORDER — LIDOCAINE VISCOUS 2 % MT SOLN
OROMUCOSAL | Status: AC
  Filled 2017-10-26: qty 15

## 2017-10-26 NOTE — Discharge Instructions (Signed)
°Colonoscopy °Discharge Instructions ° °Read the instructions outlined below and refer to this sheet in the next few weeks. These discharge instructions provide you with general information on caring for yourself after you leave the hospital. Your doctor may also give you specific instructions. While your treatment has been planned according to the most current medical practices available, unavoidable complications occasionally occur. If you have any problems or questions after discharge, call Dr. Rourk at [phone]. °ACTIVITY °· You may resume your regular activity, but move at a slower pace for the next 24 hours.  °· Take frequent rest periods for the next 24 hours.  °· Walking will help get rid of the air and reduce the bloated feeling in your belly (abdomen).  °· No driving for 24 hours (because of the medicine (anesthesia) used during the test).   °· Do not sign any important legal documents or operate any machinery for 24 hours (because of the anesthesia used during the test).  °NUTRITION °· Drink plenty of fluids.  °· You may resume your normal diet as instructed by your doctor.  °· Begin with a light meal and progress to your normal diet. Heavy or fried foods are harder to digest and may make you feel sick to your stomach (nauseated).  °· Avoid alcoholic beverages for 24 hours or as instructed.  °MEDICATIONS °· You may resume your normal medications unless your doctor tells you otherwise.  °WHAT YOU CAN EXPECT TODAY °· Some feelings of bloating in the abdomen.  °· Passage of more gas than usual.  °· Spotting of blood in your stool or on the toilet paper.  °IF YOU HAD POLYPS REMOVED DURING THE COLONOSCOPY: °· No aspirin products for 7 days or as instructed.  °· No alcohol for 7 days or as instructed.  °· Eat a soft diet for the next 24 hours.  °FINDING OUT THE RESULTS OF YOUR TEST °Not all test results are available during your visit. If your test results are not back during the visit, make an appointment  with your caregiver to find out the results. Do not assume everything is normal if you have not heard from your caregiver or the medical facility. It is important for you to follow up on all of your test results.  °SEEK IMMEDIATE MEDICAL ATTENTION IF: °· You have more than a spotting of blood in your stool.  °· Your belly is swollen (abdominal distention).  °· You are nauseated or vomiting.  °· You have a temperature over 101.  °· You have abdominal pain or discomfort that is severe or gets worse throughout the day.  °EGD °Discharge instructions °Please read the instructions outlined below and refer to this sheet in the next few weeks. These discharge instructions provide you with general information on caring for yourself after you leave the hospital. Your doctor may also give you specific instructions. While your treatment has been planned according to the most current medical practices available, unavoidable complications occasionally occur. If you have any problems or questions after discharge, please call your doctor. °ACTIVITY °· You may resume your regular activity but move at a slower pace for the next 24 hours.  °· Take frequent rest periods for the next 24 hours.  °· Walking will help expel (get rid of) the air and reduce the bloated feeling in your abdomen.  °· No driving for 24 hours (because of the anesthesia (medicine) used during the test).  °· You may shower.  °· Do not sign any important   legal documents or operate any machinery for 24 hours (because of the anesthesia used during the test).  NUTRITION  Drink plenty of fluids.   You may resume your normal diet.   Begin with a light meal and progress to your normal diet.   Avoid alcoholic beverages for 24 hours or as instructed by your caregiver.  MEDICATIONS  You may resume your normal medications unless your caregiver tells you otherwise.  WHAT YOU CAN EXPECT TODAY  You may experience abdominal discomfort such as a feeling of fullness  or gas pains.  FOLLOW-UP  Your doctor will discuss the results of your test with you.  SEEK IMMEDIATE MEDICAL ATTENTION IF ANY OF THE FOLLOWING OCCUR:  Excessive nausea (feeling sick to your stomach) and/or vomiting.   Severe abdominal pain and distention (swelling).   Trouble swallowing.   Temperature over 101 F (37.8 C).   Rectal bleeding or vomiting of blood.    Gastroesophageal Reflux Disease, Adult Normally, food travels down the esophagus and stays in the stomach to be digested. However, when a person has gastroesophageal reflux disease (GERD), food and stomach acid move back up into the esophagus. When this happens, the esophagus becomes sore and inflamed. Over time, GERD can create small holes (ulcers) in the lining of the esophagus. What are the causes? This condition is caused by a problem with the muscle between the esophagus and the stomach (lower esophageal sphincter, or LES). Normally, the LES muscle closes after food passes through the esophagus to the stomach. When the LES is weakened or abnormal, it does not close properly, and that allows food and stomach acid to go back up into the esophagus. The LES can be weakened by certain dietary substances, medicines, and medical conditions, including:  Tobacco use.  Pregnancy.  Having a hiatal hernia.  Heavy alcohol use.  Certain foods and beverages, such as coffee, chocolate, onions, and peppermint.  What increases the risk? This condition is more likely to develop in:  People who have an increased body weight.  People who have connective tissue disorders.  People who use NSAID medicines.  What are the signs or symptoms? Symptoms of this condition include:  Heartburn.  Difficult or painful swallowing.  The feeling of having a lump in the throat.  Abitter taste in the mouth.  Bad breath.  Having a large amount of saliva.  Having an upset or bloated stomach.  Belching.  Chest pain.  Shortness  of breath or wheezing.  Ongoing (chronic) cough or a night-time cough.  Wearing away of tooth enamel.  Weight loss.  Different conditions can cause chest pain. Make sure to see your health care provider if you experience chest pain. How is this diagnosed? Your health care provider will take a medical history and perform a physical exam. To determine if you have mild or severe GERD, your health care provider may also monitor how you respond to treatment. You may also have other tests, including:  An endoscopy toexamine your stomach and esophagus with a small camera.  A test thatmeasures the acidity level in your esophagus.  A test thatmeasures how much pressure is on your esophagus.  A barium swallow or modified barium swallow to show the shape, size, and functioning of your esophagus.  How is this treated? The goal of treatment is to help relieve your symptoms and to prevent complications. Treatment for this condition may vary depending on how severe your symptoms are. Your health care provider may recommend:  Changes to  your diet.  Medicine.  Surgery.  Follow these instructions at home: Diet  Follow a diet as recommended by your health care provider. This may involve avoiding foods and drinks such as: ? Coffee and tea (with or without caffeine). ? Drinks that containalcohol. ? Energy drinks and sports drinks. ? Carbonated drinks or sodas. ? Chocolate and cocoa. ? Peppermint and mint flavorings. ? Garlic and onions. ? Horseradish. ? Spicy and acidic foods, including peppers, chili powder, curry powder, vinegar, hot sauces, and barbecue sauce. ? Citrus fruit juices and citrus fruits, such as oranges, lemons, and limes. ? Tomato-based foods, such as red sauce, chili, salsa, and pizza with red sauce. ? Fried and fatty foods, such as donuts, french fries, potato chips, and high-fat dressings. ? High-fat meats, such as hot dogs and fatty cuts of red and white meats, such  as rib eye steak, sausage, ham, and bacon. ? High-fat dairy items, such as whole milk, butter, and cream cheese.  Eat small, frequent meals instead of large meals.  Avoid drinking large amounts of liquid with your meals.  Avoid eating meals during the 2-3 hours before bedtime.  Avoid lying down right after you eat.  Do not exercise right after you eat. General instructions  Pay attention to any changes in your symptoms.  Take over-the-counter and prescription medicines only as told by your health care provider. Do not take aspirin, ibuprofen, or other NSAIDs unless your health care provider told you to do so.  Do not use any tobacco products, including cigarettes, chewing tobacco, and e-cigarettes. If you need help quitting, ask your health care provider.  Wear loose-fitting clothing. Do not wear anything tight around your waist that causes pressure on your abdomen.  Raise (elevate) the head of your bed 6 inches (15cm).  Try to reduce your stress, such as with yoga or meditation. If you need help reducing stress, ask your health care provider.  If you are overweight, reduce your weight to an amount that is healthy for you. Ask your health care provider for guidance about a safe weight loss goal.  Keep all follow-up visits as told by your health care provider. This is important. Contact a health care provider if:  You have new symptoms.  You have unexplained weight loss.  You have difficulty swallowing, or it hurts to swallow.  You have wheezing or a persistent cough.  Your symptoms do not improve with treatment.  You have a hoarse voice. Get help right away if:  You have pain in your arms, neck, jaw, teeth, or back.  You feel sweaty, dizzy, or light-headed.  You have chest pain or shortness of breath.  You vomit and your vomit looks like blood or coffee grounds.  You faint.  Your stool is bloody or black.  You cannot swallow, drink, or eat. This information is  not intended to replace advice given to you by your health care provider. Make sure you discuss any questions you have with your health care provider. Document Released: 04/20/2005 Document Revised: 12/09/2015 Document Reviewed: 11/05/2014 Elsevier Interactive Patient Education  2018 [employer].   Colon Polyps Polyps are tissue growths inside the body. Polyps can grow in many places, including the large intestine (colon). A polyp may be a round bump or a mushroom-shaped growth. You could have one polyp or several. Most colon polyps are noncancerous (benign). However, some colon polyps can become cancerous over time. What are the causes? The exact cause of colon polyps is not known.  What increases the risk? This condition is more likely to develop in people who:  Have a family history of colon cancer or colon polyps.  Are older than 7 or older than 45 if they are African American.  Have inflammatory bowel disease, such as ulcerative colitis or Crohn disease.  Are overweight.  Smoke cigarettes.  Do not get enough exercise.  Drink too much alcohol.  Eat a diet that is: ? High in fat and red meat. ? Low in fiber.  Had childhood cancer that was treated with abdominal radiation.  What are the signs or symptoms? Most polyps do not cause symptoms. If you have symptoms, they may include:  Blood coming from your rectum when having a bowel movement.  Blood in your stool.The stool may look dark red or black.  A change in bowel habits, such as constipation or diarrhea.  How is this diagnosed? This condition is diagnosed with a colonoscopy. This is a procedure that uses a lighted, flexible scope to look at the inside of your colon. How is this treated? Treatment for this condition involves removing any polyps that are found. Those polyps will then be tested for cancer. If cancer is found, your health care provider will talk to you about options for colon cancer treatment. Follow  these instructions at home: Diet  Eat plenty of fiber, such as fruits, vegetables, and whole grains.  Eat foods that are high in calcium and vitamin D, such as milk, cheese, yogurt, eggs, liver, fish, and broccoli.  Limit foods high in fat, red meats, and processed meats, such as hot dogs, sausage, bacon, and lunch meats.  Maintain a healthy weight, or lose weight if recommended by your health care provider. General instructions  Do not smoke cigarettes.  Do not drink alcohol excessively.  Keep all follow-up visits as told by your health care provider. This is important. This includes keeping regularly scheduled colonoscopies. Talk to your health care provider about when you need a colonoscopy.  Exercise every day or as told by your health care provider. Contact a health care provider if:  You have new or worsening bleeding during a bowel movement.  You have new or increased blood in your stool.  You have a change in bowel habits.  You unexpectedly lose weight. This information is not intended to replace advice given to you by your health care provider. Make sure you discuss any questions you have with your health care provider. Document Released: 04/06/2004 Document Revised: 12/17/2015 Document Reviewed: 06/01/2015 Elsevier Interactive Patient Education  2018 [employer].   Hemorrhoids Hemorrhoids are swollen veins in and around the rectum or anus. There are two types of hemorrhoids:  Internal hemorrhoids. These occur in the veins that are just inside the rectum. They may poke through to the outside and become irritated and painful.  External hemorrhoids. These occur in the veins that are outside of the anus and can be felt as a painful swelling or hard lump near the anus.  Most hemorrhoids do not cause serious problems, and they can be managed with home treatments such as diet and lifestyle changes. If home treatments do not help your symptoms, procedures can be done to  shrink or remove the hemorrhoids. What are the causes? This condition is caused by increased pressure in the anal area. This pressure may result from various things, including:  Constipation.  Straining to have a bowel movement.  Diarrhea.  Pregnancy.  Obesity.  Sitting for long periods of time.  Heavy lifting or other activity that causes you to strain.  Anal sex.  What are the signs or symptoms? Symptoms of this condition include:  Pain.  Anal itching or irritation.  Rectal bleeding.  Leakage of stool (feces).  Anal swelling.  One or more lumps around the anus.  How is this diagnosed? This condition can often be diagnosed through a visual exam. Other exams or tests may also be done, such as:  Examination of the rectal area with a gloved hand (digital rectal exam).  Examination of the anal canal using a small tube (anoscope).  A blood test, if you have lost a significant amount of blood.  A test to look inside the colon (sigmoidoscopy or colonoscopy).  How is this treated? This condition can usually be treated at home. However, various procedures may be done if dietary changes, lifestyle changes, and other home treatments do not help your symptoms. These procedures can help make the hemorrhoids smaller or remove them completely. Some of these procedures involve surgery, and others do not. Common procedures include:  Rubber band ligation. Rubber bands are placed at the base of the hemorrhoids to cut off the blood supply to them.  Sclerotherapy. Medicine is injected into the hemorrhoids to shrink them.  Infrared coagulation. A type of light energy is used to get rid of the hemorrhoids.  Hemorrhoidectomy surgery. The hemorrhoids are surgically removed, and the veins that supply them are tied off.  Stapled hemorrhoidopexy surgery. A circular stapling device is used to remove the hemorrhoids and use staples to cut off the blood supply to them.  Follow these  instructions at home: Eating and drinking  Eat foods that have a lot of fiber in them, such as whole grains, beans, nuts, fruits, and vegetables. Ask your health care provider about taking products that have added fiber (fiber supplements).  Drink enough fluid to keep your urine clear or pale yellow. Managing pain and swelling  Take warm sitz baths for 20 minutes, 3-4 times a day to ease pain and discomfort.  If directed, apply ice to the affected area. Using ice packs between sitz baths may be helpful. ? Put ice in a plastic bag. ? Place a towel between your skin and the bag. ? Leave the ice on for 20 minutes, 2-3 times a day. General instructions  Take over-the-counter and prescription medicines only as told by your health care provider.  Use medicated creams or suppositories as told.  Exercise regularly.  Go to the bathroom when you have the urge to have a bowel movement. Do not wait.  Avoid straining to have bowel movements.  Keep the anal area dry and clean. Use wet toilet paper or moist towelettes after a bowel movement.  Do not sit on the toilet for long periods of time. This increases blood pooling and pain. Contact a health care provider if:  You have increasing pain and swelling that are not controlled by treatment or medicine.  You have uncontrolled bleeding.  You have difficulty having a bowel movement, or you are unable to have a bowel movement.  You have pain or inflammation outside the area of the hemorrhoids. This information is not intended to replace advice given to you by your health care provider. Make sure you discuss any questions you have with your health care provider. Document Released: 07/08/2000 Document Revised: 12/09/2015 Document Reviewed: 03/25/2015 Elsevier Interactive Patient Education  2018 [employer].   GERD information provided  Increase Protonix to 40 mg twice daily.  Resume all other medications as usual.  Polyp and hemorrhoid  information provided  Further recommendations to follow pending review of pathology report  Office visit with [place] in 2 months

## 2017-10-26 NOTE — Anesthesia Postprocedure Evaluation (Signed)
Anesthesia Post Note  Patient: Carrie Turner  Procedure(s) Performed: COLONOSCOPY WITH PROPOFOL (N/A ) ESOPHAGOGASTRODUODENOSCOPY (EGD) WITH PROPOFOL (N/A ) ESOPHAGEAL DILATION  Patient location during evaluation: PACU Anesthesia Type: MAC Level of consciousness: awake and alert and patient cooperative Pain management: satisfactory to patient Vital Signs Assessment: vitals unstable Respiratory status: spontaneous breathing Cardiovascular status: stable Postop Assessment: no apparent nausea or vomiting Anesthetic complications: no     Last Vitals:  Vitals:   10/26/17 1344 10/26/17 1409  BP: 114/69 130/82  Pulse: 70 73  Resp: 14 18  Temp: 36.6 C 36.6 C  SpO2: 95% 98%    Last Pain:  Vitals:   10/26/17 1409  TempSrc: Oral  PainSc: 0-No pain                 Nehan Flaum

## 2017-10-26 NOTE — Op Note (Signed)
[facility] Patient Name: Carrie Turner Procedure Date: 10/26/2017 1:16 PM MRN: [id number] Date of Birth: 1951/02/10 Attending MD: Carrie Turner , MD CSN: [id number] Age: 67 Admit Type: Outpatient Procedure:                Colonoscopy Indications:              Chronic diarrhea Providers:                Carrie Richards, MD, Carrie Turner. Carrie Sers RN, RN,                            Carrie Del, RN, Carrie Turner Referring MD:             Carrie Canada Manuella Ghazi MD, MD Medicines:                Propofol per Anesthesia Complications:            No immediate complications. Estimated Blood Loss:     Estimated blood loss was minimal. Procedure:                Pre-Anesthesia Assessment:                           - Prior to the procedure, a History and Physical                            was performed, and patient medications and                            allergies were reviewed. The patient's tolerance of                            previous anesthesia was also reviewed. The risks                            and benefits of the procedure and the sedation                            options and risks were discussed with the patient.                            All questions were answered, and informed consent                            was obtained. Prior Anticoagulants: The patient has                            taken no previous anticoagulant or antiplatelet                            agents. ASA Grade Assessment: II - A patient with                            mild systemic disease. After reviewing the risks  and benefits, the patient was deemed in                            satisfactory condition to undergo the procedure.                           After obtaining informed consent, the colonoscope                            was passed under direct vision. Throughout the                            procedure, the patient's blood pressure, pulse, and         oxygen saturations were monitored continuously. The                            EC38-i10L ([id number]) scope was introduced through                            the and advanced to the the terminal ileum, with                            identification of the appendiceal orifice and IC                            valve. The colonoscopy was performed without                            difficulty. The patient tolerated the procedure                            well. The quality of the bowel preparation was                            adequate. The entire colon was well visualized. Scope In: 1:20:55 PM Scope Out: 1:35:13 PM Scope Withdrawal Time: 0 hours 7 minutes 29 seconds  Total Procedure Duration: 0 hours 14 minutes 18 seconds  Findings:      The perianal and digital rectal examinations were normal.      A 5 mm polyp was found in the hepatic flexure. The polyp was       semi-pedunculated. The polyp was removed with a cold snare. Resection       and retrieval were complete. Estimated blood loss was minimal. This was       biopsied with a cold forceps for histology. Estimated blood loss was       minimal.      Non-bleeding internal hemorrhoids were found during retroflexion. The       hemorrhoids were moderate, medium-sized and Grade I (internal       hemorrhoids that do not prolapse).      The exam was otherwise without abnormality on direct and retroflexion       views. Impression:               - One 5 mm polyp at the hepatic flexure, removed  with a cold snare. Resected and retrieved. Biopsied.                           - Non-bleeding internal hemorrhoids.                           - The examination was otherwise normal on direct                            and retroflexion views. Moderate Sedation:      Moderate (conscious) sedation was personally administered by an       anesthesia professional. The following parameters were monitored: oxygen        saturation, heart rate, blood pressure, respiratory rate, EKG, adequacy       of pulmonary ventilation, and response to care. Total physician       intraservice time was 38 minutes. Recommendation:           - Patient has a contact number available for                            emergencies. The signs and symptoms of potential                            delayed complications were discussed with the                            patient. Return to normal activities tomorrow.                            Written discharge instructions were provided to the                            patient.                           - Resume previous diet.                           - Continue present medications.                           - Await pathology results. See EGD report.                           - Repeat colonoscopy date to be determined after                            pending pathology results are reviewed for                            surveillance based on pathology results.                           - Return to GI office in 2 months. Procedure Code(s):        --- Professional ---  45385, Colonoscopy, flexible; with removal of                            tumor(s), polyp(s), or other lesion(s) by snare                            technique Diagnosis Code(s):        --- Professional ---                           D12.3, Benign neoplasm of transverse colon (hepatic                            flexure or splenic flexure)                           K64.0, First degree hemorrhoids                           K52.9, Noninfective gastroenteritis and colitis,                            unspecified CPT copyright 2017 [employer]. All rights reserved. The codes documented in this report are preliminary and upon coder review may  be revised to meet current compliance requirements. [name]. Rourk, MD Carrie Richards, MD 10/26/2017 1:47:53 PM This report has been  signed electronically. Number of Addenda: 0

## 2017-10-26 NOTE — H&P (Signed)
@LOGO @   Primary Care Physician:  Monico Blitz, MD Primary Gastroenterologist:  Dr. Gala Romney  Pre-Procedure History & Physical: HPI:  Carrie Turner is a 67 y.o. female here for EGD, epigastric pain, rectal bleeding and chronic diarrhea via EGD and TCS.  Also reports esophageal dysphagia to me today.  Past Medical History:  Diagnosis Date  . Anxiety   . COPD (chronic obstructive pulmonary disease) ([place])   . Depression   . Diabetes mellitus, type II ([place])   . GERD (gastroesophageal reflux disease)   . Glaucoma   . Hyperlipidemia   . Hypertension   . Migraines   . Neuropathy   . Seizures ([place])    seizure was from ETOH, "a long time ago", no med and no recurrance  . Vitamin D deficiency     Past Surgical History:  Procedure Laterality Date  . ABDOMINAL HYSTERECTOMY    . COLONOSCOPY  11/2015   Dr. Britta Mccreedy: sessile polyp removed (benign). advised repeat colonoscopy in 5 years.   . ESOPHAGOGASTRODUODENOSCOPY  11/2015   Dr. Britta Mccreedy: hiatal hernia  . KNEE ARTHROSCOPY Right   . ORIF ANKLE FRACTURE Right     Prior to Admission medications   Medication Sig Start Date End Date Taking? Authorizing Provider  amLODipine (NORVASC) 2.5 MG tablet Take 2.5 mg by mouth daily.   Yes [provider]  Cholecalciferol (VITAMIN D3) 2000 units TABS Take 2,000 Units by mouth daily.   Yes [provider]  citalopram (CELEXA) 40 MG tablet Take 40 mg by mouth daily.   Yes [provider]  Ferrous Gluconate (IRON 27 PO) Take 1 tablet by mouth daily.   Yes [provider]  gabapentin (NEURONTIN) 800 MG tablet Take 800 mg by mouth 3 (three) times daily.   Yes [provider]  ibuprofen (ADVIL,MOTRIN) 200 MG tablet Take 400 mg by mouth every 8 (eight) hours as needed (for headaches.).    Yes [provider]  Insulin Detemir (LEVEMIR FLEXTOUCH) 100 UNIT/ML Pen Inject 80 Units into the skin at bedtime. 10/18/17  Yes Nida, Marella Chimes, MD  insulin lispro  (HUMALOG KWIKPEN) 100 UNIT/ML KiwkPen Inject 0.15-0.21 mLs (15-21 Units total) into the skin 3 (three) times daily. 10/18/17  Yes Nida, Marella Chimes, MD  latanoprost (XALATAN) 0.005 % ophthalmic solution Place 1 drop into both eyes at bedtime.   Yes [provider]  lisinopril-hydrochlorothiazide (PRINZIDE,ZESTORETIC) 20-12.5 MG tablet Take 1 tablet by mouth 2 (two) times daily.   Yes [provider]  pantoprazole (PROTONIX) 40 MG tablet Take 40 mg by mouth 2 (two) times daily.    Yes [provider]  polyethylene glycol-electrolytes (TRILYTE) 420 g solution Take 4,000 mLs by mouth as directed. 09/26/17  Yes Rourk, [name], MD  pravastatin (PRAVACHOL) 40 MG tablet Take 40 mg by mouth daily.   Yes [provider]  pyridOXINE (VITAMIN B-6) 100 MG tablet Take 100 mg by mouth daily.   Yes [provider]  ranitidine (ZANTAC) 150 MG capsule Take 150 mg by mouth every evening.   Yes [provider]  Suvorexant (BELSOMRA) 20 MG TABS Take 20 mg by mouth at bedtime.    Yes [provider]  vitamin B-12 (CYANOCOBALAMIN) 500 MCG tablet Take 500 mcg by mouth daily.   Yes [provider]  Continuous Blood Gluc Sensor (FREESTYLE LIBRE 14 DAY SENSOR) MISC Inject 1 each into the skin every 14 (fourteen) days. Use as directed. 10/18/17   Cassandria Anger, MD  docusate  sodium (COLACE) 100 MG capsule Take 100 mg by mouth 2 (two) times daily as needed for mild constipation.    [provider]  [facility] VERIO test strip USE 1 STRIP TO CHECK GLUCOSE 4 TIMES DAILY 07/14/17   Cassandria Anger, MD    Allergies as of 09/26/2017 - Review Complete 09/20/2017  Allergen Reaction Noted  . Sulfa antibiotics  03/04/2016    Family History  Problem Relation Age of Onset  . Hypertension Mother   . Colon cancer Mother        diagnosed early 63s  . Cerebral aneurysm Mother   . Alcohol abuse Father   . Pancreatic cancer Sister         deceased in 29s  . Colon cancer Sister        diagnosed 63 y/o  . Stroke Brother   . Cancer Maternal Grandmother   . Cancer Maternal Grandfather   . Cancer Paternal Grandmother   . Other Paternal Grandfather   . Other Brother     Social History   Socioeconomic History  . Marital status: Single    Spouse name: Not on file  . Number of children: Not on file  . Years of education: Not on file  . Highest education level: Not on file  Occupational History  . Not on file  Social Needs  . Financial resource strain: Not on file  . Food insecurity:    Worry: Not on file    Inability: Not on file  . Transportation needs:    Medical: Not on file    Non-medical: Not on file  Tobacco Use  . Smoking status: Never Smoker  . Smokeless tobacco: Never Used  Substance and Sexual Activity  . Alcohol use: No  . Drug use: No  . Sexual activity: Never  Lifestyle  . Physical activity:    Days per week: Not on file    Minutes per session: Not on file  . Stress: Not on file  Relationships  . Social connections:    Talks on phone: Not on file    Gets together: Not on file    Attends religious service: Not on file    Active member of club or organization: Not on file    Attends meetings of clubs or organizations: Not on file    Relationship status: Not on file  . Intimate partner violence:    Fear of current or ex partner: Not on file    Emotionally abused: Not on file    Physically abused: Not on file    Forced sexual activity: Not on file  Other Topics Concern  . Not on file  Social History Narrative  . Not on file    Review of Systems: See HPI, otherwise negative ROS  Physical Exam: BP (!) 161/94   Pulse 72   Temp 97.9 F (36.6 C) (Oral)   Resp 13   SpO2 91%  General:   Alert,  Well-developed, well-nourished, pleasant and cooperative in NAD Lungs:  Clear throughout to auscultation.   No wheezes, crackles, or rhonchi. No acute distress. Heart:  Regular rate and rhythm;  no murmurs, clicks, rubs,  or gallops. Abdomen: Non-distended, normal bowel sounds.  Soft and nontender without appreciable mass or hepatosplenomegaly.  Pulses:  Normal pulses noted. Extremities:  Without clubbing or edema.  Impression: Pleasant 67 year old lady with esophageal dysphagia epigastric pain and GERD also would recommend bleeding chronic diarrhea.  Recommendations:  I have offered the patient both an EGD  with esophageal dilation as well as a diagnostic colonoscopy today per plan. The risks, benefits, limitations, imponderables and alternatives regarding both EGD and colonoscopy have been reviewed with the patient. Questions have been answered. All parties agreeable.      Notice: This dictation was prepared with Dragon dictation along with smaller phrase technology. Any transcriptional errors that result from this process are unintentional and may not be corrected upon review.       The risks, benefits, limitations, imponderables and alternatives regarding both EGD and colonoscopy have been reviewed with the patient. Questions have been answered. All parties agreeable.

## 2017-10-26 NOTE — Anesthesia Procedure Notes (Signed)
Procedure Name: MAC Date/Time: 10/26/2017 12:56 PM Performed by: Vista Deck, CRNA Pre-anesthesia Checklist: Patient identified, Emergency Drugs available, Suction available, Timeout performed and Patient being monitored Patient Re-evaluated:Patient Re-evaluated prior to induction Oxygen Delivery Method: Non-rebreather mask

## 2017-10-26 NOTE — Anesthesia Preprocedure Evaluation (Signed)
Anesthesia Evaluation  Patient identified by MRN, date of birth, ID band Patient awake    Reviewed: Allergy & Precautions, NPO status , Patient's Chart, lab work & pertinent test results  Airway Mallampati: II  TM Distance: >3 FB Neck ROM: Full    Dental no notable dental hx. (+) Edentulous Upper, Edentulous Lower   Pulmonary COPD,    Pulmonary exam normal breath sounds clear to auscultation       Cardiovascular hypertension, Normal cardiovascular exam Rhythm:Regular Rate:Normal     Neuro/Psych  Headaches, Seizures -,  Anxiety Depression    GI/Hepatic GERD  ,  Endo/Other  diabetes  Renal/GU      Musculoskeletal   Abdominal   Peds  Hematology  (+) anemia ,   Anesthesia Other Findings   Reproductive/Obstetrics                             Anesthesia Physical Anesthesia Plan  ASA: III  Anesthesia Plan: MAC   Post-op Pain Management:    Induction: Intravenous  PONV Risk Score and Plan:   Airway Management Planned: Nasal Cannula  Additional Equipment:   Intra-op Plan:   Post-operative Plan:   Informed Consent: I have reviewed the patients History and Physical, chart, labs and discussed the procedure including the risks, benefits and alternatives for the proposed anesthesia with the patient or authorized representative who has indicated his/her understanding and acceptance.     Plan Discussed with: CRNA  Anesthesia Plan Comments:         Anesthesia Quick Evaluation

## 2017-10-26 NOTE — Transfer of Care (Signed)
Immediate Anesthesia Transfer of Care Note  Patient: RAYVON BRANDVOLD  Procedure(s) Performed: COLONOSCOPY WITH PROPOFOL (N/A ) ESOPHAGOGASTRODUODENOSCOPY (EGD) WITH PROPOFOL (N/A ) ESOPHAGEAL DILATION  Patient Location: PACU  Anesthesia Type:MAC  Level of Consciousness: awake, alert  and patient cooperative  Airway & Oxygen Therapy: Patient Spontanous Breathing  Post-op Assessment: Report given to RN and Post -op Vital signs reviewed and stable  Post vital signs: Reviewed and stable  Last Vitals:  Vitals Value Taken Time  BP    Temp    Pulse 73 10/26/2017  1:46 PM  Resp 18 10/26/2017  1:46 PM  SpO2 94 % 10/26/2017  1:46 PM  Vitals shown include unvalidated device data.  Last Pain:  Vitals:   10/26/17 1303  TempSrc:   PainSc: 0-No pain      Patients Stated Pain Goal: 7 (99/83/38 2505)  Complications: No apparent anesthesia complications

## 2017-10-27 LAB — MITOCHONDRIAL ANTIBODIES

## 2017-10-27 LAB — HEPATITIS B SURFACE ANTIGEN: Hepatitis B Surface Ag: NEGATIVE

## 2017-10-27 LAB — ANA: Anti Nuclear Antibody(ANA): NEGATIVE

## 2017-10-27 LAB — ANTI-SMOOTH MUSCLE ANTIBODY, IGG: F-Actin IgG: 8 Units (ref 0–19)

## 2017-10-27 LAB — HEPATITIS C ANTIBODY

## 2017-10-27 LAB — TISSUE TRANSGLUTAMINASE, IGA: Tissue Transglutaminase Ab, IgA: <2 U/mL (ref 0–3)

## 2017-10-27 NOTE — Op Note (Signed)
[facility] Patient Name: Carrie Turner Procedure Date: 10/26/2017 12:27 PM MRN: [id number] Date of Birth: [birth year] Attending MD: Norvel Richards , MD CSN: [id number] Age: 67 Admit Type: Outpatient Procedure:                Upper GI endoscopy Indications:              Epigastric abdominal pain, Dysphagia Providers:                Norvel Richards, MD, Charlsie Quest. Theda Sers RN, RN,                            Lurline Del, RN, Aram Candela, Nelma Rothman, Technician Referring MD:              Medicines:                Propofol per Anesthesia Complications:            No immediate complications. Estimated Blood Loss:     Estimated blood loss was minimal. Procedure:                Pre-Anesthesia Assessment:                           - Prior to the procedure, a History and Physical                            was performed, and patient medications and                            allergies were reviewed. The patient's tolerance of                            previous anesthesia was also reviewed. The risks                            and benefits of the procedure and the sedation                            options and risks were discussed with the patient.                            All questions were answered, and informed consent                            was obtained. Prior Anticoagulants: The patient has                            taken no previous anticoagulant or antiplatelet                            agents. ASA Grade Assessment: II - A patient with                            mild systemic disease. After reviewing the risks  and benefits, the patient was deemed in                            satisfactory condition to undergo the procedure.                           After obtaining informed consent, the endoscope was                            passed under direct vision. Throughout the                            procedure, the patient's blood pressure,  pulse, and                            oxygen saturations were monitored continuously. The                            EG29-[id number] ([id number]) scope was introduced through the                            mouth, and advanced to the second part of duodenum.                            The upper GI endoscopy was accomplished without                            difficulty. The patient tolerated the procedure                            well. Scope In: 1:07:41 PM Scope Out: 1:15:31 PM Scope Withdrawal Time: 0 hours 0 minutes 24 seconds  Total Procedure Duration: 0 hours 7 minutes 50 seconds  Findings:      Esophagitis was found. distal esophageal erosions within 5 mm of the       GEJ. Tubular esophagus pain is throughout its course. No Barrett's       epithelium seen. No tumor.      A small hiatal hernia was present.      The exam was otherwise without abnormality.      The duodenal bulb, second portion of the duodenum and third portion of       the duodenum were normal. The scope was withdrawn. Dilation was       performed with a Maloney dilator with mild resistance at 70 Fr. The       dilation site was examined following endoscope reinsertion and showed no       change. Estimated blood loss: none. Duodenal mucosa biopsied with a cold       forceps for histology. Estimated blood loss was minimal. This was       biopsied with a cold forceps for histology. Estimated blood loss was       minimal. Impression:               - Reflux Esophagitis. Dilated.                           -  Small hiatal hernia.                           - The examination was otherwise normal.                           - Normal duodenal bulb, second portion of the                            duodenum and third portion of the duodenum.                            Biopsied. Moderate Sedation:      Moderate (conscious) sedation was personally administered by an       anesthesia professional. The following parameters were monitored:  oxygen       saturation, heart rate, blood pressure, respiratory rate, EKG, adequacy       of pulmonary ventilation, and response to care. Total physician       intraservice time was 16 minutes. Recommendation:           - Patient has a contact number available for                            emergencies. The signs and symptoms of potential                            delayed complications were discussed with the                            patient. Return to normal activities tomorrow.                            Written discharge instructions were provided to the                            patient.                           - Resume previous diet.                           - Continue present medications. Increase Protonix                            to 40 mg twice daily.                           - No repeat upper endoscopy.                           - Return to GI office in 2 months. Follow-up on                            pathology. See colonoscopy report Procedure Code(s):        --- Professional ---  28003, Esophagogastroduodenoscopy, flexible,                            transoral; with biopsy, single or multiple                           43450, Dilation of esophagus, by unguided sound or                            bougie, single or multiple passes Diagnosis Code(s):        --- Professional ---                           K20.9, Esophagitis, unspecified                           K44.9, Diaphragmatic hernia without obstruction or                            gangrene                           R10.13, Epigastric pain                           R13.10, Dysphagia, unspecified CPT copyright 2017 American Medical Association. All rights reserved. The codes documented in this report are preliminary and upon coder review may  be revised to meet current compliance requirements. [name]. Aviraj Kentner, MD Norvel Richards, MD 10/27/2017 12:21:11 PM This report has been signed  electronically. Number of Addenda: 0

## 2017-10-28 LAB — IGG, IGA, IGM
IGA: 247 mg/dL (ref 87–352)
IGM (IMMUNOGLOBULIN M), SRM: 68 mg/dL (ref 26–217)
IgG (Immunoglobin G), Serum: 848 mg/dL (ref 700–1600)

## 2017-10-30 ENCOUNTER — Other Ambulatory Visit: Payer: Self-pay

## 2017-10-30 MED ORDER — INSULIN DETEMIR 100 UNIT/ML FLEXPEN: 80.0000 [IU] | PEN_INJECTOR | Freq: Every day | SUBCUTANEOUS | 2 refills | Status: DC

## 2017-10-31 ENCOUNTER — Encounter (HOSPITAL_COMMUNITY): Payer: Self-pay | Admitting: Internal Medicine

## 2017-10-31 LAB — IGG, IGA, IGM

## 2017-11-06 NOTE — Progress Notes (Signed)
Please let patient know her labs for celiac were negative, she does not have hep b or c. Her autoimmune marker and PBC markers were negative.   Would recommend repeat LFTs, along with GGT in 3 months.

## 2017-11-07 ENCOUNTER — Other Ambulatory Visit: Payer: Self-pay

## 2017-11-07 DIAGNOSIS — R748 Abnormal levels of other serum enzymes: Secondary | ICD-10-CM

## 2017-11-08 ENCOUNTER — Telehealth: Payer: Self-pay

## 2017-11-08 NOTE — Telephone Encounter (Signed)
Pts Prescription returned back to our office. LMOM, waiting on a return call from pt to discuss picking up RX or getting a correct address.

## 2017-11-14 ENCOUNTER — Telehealth: Payer: Self-pay | Admitting: Internal Medicine

## 2017-11-14 MED ORDER — ONDANSETRON HCL 4 MG PO TABS: 4.0000 mg | ORAL_TABLET | Freq: Four times a day (QID) | ORAL | 0 refills | Status: DC | PRN

## 2017-11-14 NOTE — Telephone Encounter (Signed)
Pt called wanting me to let the nurse know that she is still sick on her stomach. She had procedure by [facility] on 4/4 and has FU OV in June. Please call her at [phone] or [phone]

## 2017-11-14 NOTE — Telephone Encounter (Signed)
Lmom, waiting on a return call.  

## 2017-11-14 NOTE — Telephone Encounter (Signed)
Pt returned call. Pts rx from [facility] for Protonix will be mailed back to her. The correct address was listed but might've been placed in the wrong mailbox.

## 2017-11-14 NOTE — Addendum Note (Signed)
Addended by: Mahala Menghini on: 11/14/2017 12:56 PM   Modules accepted: Orders

## 2017-11-14 NOTE — Telephone Encounter (Signed)
Closing note out. Documentation is in another phone note.

## 2017-11-14 NOTE — Telephone Encounter (Signed)
Spoke with pt, she is educated on how to take medication. Pt is aware that rx will be sent to her pharmacy.

## 2017-11-14 NOTE — Telephone Encounter (Signed)
Spoke with pt and she had no nausea last week but this week pt has some nausea. She says nausea comes and goes and symptoms aren't improving. Pt is taking Protonix as directed daily. Pt went to the [facility] food store to get some ginger crystals. Pt doesn't have anything else to take for nausea. Pt would like something called into her drug store.

## 2017-11-14 NOTE — Telephone Encounter (Signed)
Patient should be on twice daily Pantoprazole since her EGD. Please make sure she is.   I will send in Zofran to [facility] in [place].

## 2017-11-14 NOTE — Telephone Encounter (Signed)
Pt was returning a call to AM. Please call her back at [phone]

## 2017-11-17 ENCOUNTER — Telehealth: Payer: Self-pay | Admitting: Internal Medicine

## 2017-11-17 ENCOUNTER — Encounter: Payer: Self-pay | Admitting: Internal Medicine

## 2017-11-17 NOTE — Telephone Encounter (Signed)
Pt is inquiring about her colonoscopy results.

## 2017-11-17 NOTE — Telephone Encounter (Addendum)
Dr. Gala Romney, I don't see any correspondence about her path results from earlier this month.

## 2017-11-17 NOTE — Telephone Encounter (Signed)
Patient called inquiring about results from her colonoscopy.

## 2017-11-19 ENCOUNTER — Encounter: Payer: Self-pay | Admitting: Internal Medicine

## 2017-11-21 ENCOUNTER — Telehealth: Payer: Self-pay | Admitting: Internal Medicine

## 2017-11-21 NOTE — Telephone Encounter (Signed)
Pt was calling to see if her lab results were back yet. Please call [phone]

## 2017-11-21 NOTE — Telephone Encounter (Signed)
Spoke with pt, pt discussed lab results from 10/26/17. Pt is taking Zofran for nausea and wants to know if Bentyl can be taken for stomach cramps. Pt said she still isn't feeling her best. She's taking Protonix and feels she may need to be switched to another PPI like Nexium.

## 2017-11-22 NOTE — Telephone Encounter (Signed)
Patient called wanting to know if we could call in a Rx for a "stomach ache", she's been having problems since she had her colonoscopy.  Patient denies having any fevers or vomiting, we offered her an appointment for tomorrow, however she said she will not be able to make that appointment.  Routing to Dr. Gala Romney

## 2017-11-23 MED ORDER — SUCRALFATE 1 G PO TABS: 1.0000 g | ORAL_TABLET | Freq: Three times a day (TID) | ORAL | 0 refills | Status: DC

## 2017-11-23 NOTE — Addendum Note (Signed)
Addended by: Mahala Menghini on: 11/23/2017 02:53 PM   Modules accepted: Orders

## 2017-11-23 NOTE — Telephone Encounter (Addendum)
Carrie Turner, can we touch base with patient and get more information. Multiple phone notes requesting multiple medications since her procedures.   Let's get details of her complaint. ie abdominal pain or cramps, where located, any diarrhea or constipation, heartburn etc.  Noted routed to me late yesterday instead of Dr. Gala Romney as listed. So I want to make sure we have everything covered.

## 2017-11-23 NOTE — Telephone Encounter (Signed)
Pt notified, pt scheduled for Wednesday 11/28/17 with EG.

## 2017-11-23 NOTE — Telephone Encounter (Signed)
spoke with pt and she states her pain is located under her breast/upper abdomen. Pt said the pain has been present and hasn't left that site. Pt continues to have nausea off and on and is taking Zofran as needed. Pt reports no vomiting pr diarrhea. Pt said she had a bowel movement daily until 4 days ago. Pt feels her abdomen was swollen but after taking an otc stool softener medication, Pt had a bowel movement.she said she hasn't had any more problems going to the bathroom. On a scale of 1-10, pt said her pain level is a 7.

## 2017-11-23 NOTE — Telephone Encounter (Signed)
I would recommend continue pantoprazole BID for reflux esophagitis. Zofran prn nausea.   Could offer carafate, short course for 2 weeks, 30 minutes before meals (TID) to see if this helps her nausea/upper abd pain. rx sent to [facility].  I would like for her to come in for evaluation. We have some openings in the next couple of days.

## 2017-11-28 ENCOUNTER — Ambulatory Visit (INDEPENDENT_AMBULATORY_CARE_PROVIDER_SITE_OTHER): Payer: Medicare Other | Admitting: Nurse Practitioner

## 2017-11-28 ENCOUNTER — Encounter: Payer: Self-pay | Admitting: Nurse Practitioner

## 2017-11-28 VITALS — BP 165/91 | HR 102 | Temp 97.7°F | Ht 62.0 in | Wt 214.0 lb

## 2017-11-28 DIAGNOSIS — R1013 Epigastric pain: Secondary | ICD-10-CM | POA: Diagnosis not present

## 2017-11-28 DIAGNOSIS — R110 Nausea: Secondary | ICD-10-CM | POA: Diagnosis not present

## 2017-11-28 DIAGNOSIS — K219 Gastro-esophageal reflux disease without esophagitis: Secondary | ICD-10-CM

## 2017-11-28 MED ORDER — ONDANSETRON HCL 4 MG PO TABS: 4.0000 mg | ORAL_TABLET | Freq: Four times a day (QID) | ORAL | 2 refills | Status: DC | PRN

## 2017-11-28 MED ORDER — DEXLANSOPRAZOLE 60 MG PO CPDR
60.0000 mg | DELAYED_RELEASE_CAPSULE | Freq: Every day | ORAL | 0 refills | Status: DC
Start: 2017-11-28 — End: 2018-02-21

## 2017-11-28 NOTE — Progress Notes (Signed)
Referring Provider: Monico Blitz, MD Primary Care Physician:  Monico Blitz, MD Primary GI:  Dr. Gala Romney  Chief Complaint  Patient presents with  . Abdominal Pain    Carafate helping  . Nausea    HPI:   Carrie Turner is a 67 y.o. female who presents for abdominal pain and nausea.  The patient was last seen in our office 09/20/2017 for GERD, nausea without vomiting, abdominal pain, colitis, rectal bleeding, diarrhea, family history of colon cancer, elevated alkaline phosphatase.  At that time the patient was seen at request of primary care for evaluation of GERD.  Feels symptoms are well controlled.  Takes Phenergan for nausea, rare vomiting.  Nausea daily.  Gallbladder work-up unremarkable per patient.  Patient was hospitalized at [facility] back in January. She had a CT without contrast showing stable hiatal hernia and paraumbilical hernia containing only fat. She previously brought in records from February 1 highlighting possible diagnosis of colitis. She was given Cipro and Flagyl. She reports going to ED at least three separate times for "colitis" in the past.   Received records of EGD and colonoscopy in May 2017 by Dr. Britta Mccreedy at which time she was found to have a hiatal hernia but otherwise unremarkable upper G.I. tract. Sessile polyp removed from the transverse colon, measuring 5 to 9 mm which was benign. Patient advised to come back in five years.  At her last visit she noted persistent 1 year history of diarrhea multiple times a day, heme-negative in December.  She does note occasional bright red blood per rectum.  Takes Imodium after 2 or 3 bowel movements with some may cause constipation.  Has been followed by hematology for leukocytosis and mild iron deficiency.  Chronically elevated alk phos but generally abnormal AST/ALT historically but this had normalized recently.  Liver was somewhat fatty appearance on CT in November 2018.  Recommended consider limited liver work-up  with AMA.  Consider celiac and microscopic colitis as etiology of diarrhea.  Recommended EGD and colonoscopy for further evaluation.  Definitive decision for viral hepatitis serologies, celiac labs, liver autoimmune labs.  Colonoscopy completed 10/26/2017 which found a single 5 mm polyp at the hepatic flexure status post removal, nonbleeding internal hemorrhoids, otherwise normal.  Status post colonic biopsies.  EGD found esophagitis with distal esophageal erosions within 5 mm of the gastroesophageal junction, no Barrett's, no tumor, small hiatal hernia, otherwise normal.  Recommended increase Protonix to twice daily.  Pathology found the polyps to be either adenoma and the biopsies to be benign colonic mucosa.  Duodenal biopsies found to be benign as well.  Ended repeat colonoscopy in 5 years.  Liver serologic work-up completed 10/26/2017.  Celiac labs negative, viral hepatitis negative, autoimmune markers and PBC markers negative.  Recommend repeat LFTs and GGT in 3 months (already ordered).  Patient called our office 11/14/2017 indicating persistent nausea.  Reinforced recommendation of Protonix twice daily, Zofran sent to the pharmacy.  Follow-up phone note indicated upper abdominal pain, continued intermittent nausea on Zofran, no vomiting or diarrhea.  Bowel movement daily until 4 days prior.  Feels her abdomen is swelling but after taking over-the-counter stool softener had a bowel movement and has not had any more problems going to the bathroom.  Pain described as 7 out of 10.  Recommended continue Protonix twice daily, Zofran for nausea, Carafate for breakthrough symptoms, follow-up office visit.  Today she states she is doing better on Carafate, but not resolved. Still on Protonix twice daily. Has  taken Zantac, omeprazole, pantoprazole. Hasn't tried Nexium or AciPhex. Feels like Protonix isn't "quite doing it." Abdominal pain is epigastric (which she states is typical for her GERD symptoms.) Is curious  about UC, sister has had since she was 33 years old. Denies other abdominal pain. Does have constipation and takes stool softener (Dulcolax). Stools are hard, difficult to pass. Admits intermittent hematochezia in the setting of known hemorrhoids, toilet tissue hematochezia only. Denies melena, fever, chills, unintentional weight loss. Nausea is generally improved, but did have an episode of nausea this morning. Requesting refill of Zofran. Denies chest pain, dyspnea, dizziness, lightheadedness, syncope, near syncope. Denies any other upper or lower GI symptoms.  Denies NSAIDs. No ASA powders in 1 year. Tried OTC Nexium and helped much better than Protonix.   Past Medical History:  Diagnosis Date  . Anxiety   . COPD (chronic obstructive pulmonary disease) ([place])   . Depression   . Diabetes mellitus, type II ([place])   . GERD (gastroesophageal reflux disease)   . Glaucoma   . Hyperlipidemia   . Hypertension   . Migraines   . Neuropathy   . Seizures ([place])    seizure was from ETOH, "a long time ago", no med and no recurrance  . Vitamin D deficiency     Past Surgical History:  Procedure Laterality Date  . ABDOMINAL HYSTERECTOMY    . COLONOSCOPY  11/2015   Dr. Britta Mccreedy: sessile polyp removed (benign). advised repeat colonoscopy in 5 years.   . COLONOSCOPY WITH PROPOFOL N/A 10/26/2017   Procedure: COLONOSCOPY WITH PROPOFOL;  Surgeon: Daneil Dolin, MD;  [facility];  Service: Endoscopy;  Laterality: N/A;  12:45pm  . ESOPHAGEAL DILATION  10/26/2017   Procedure: ESOPHAGEAL DILATION;  Surgeon: Daneil Dolin, MD;  Location: [facility];  Service: Endoscopy;;  . ESOPHAGOGASTRODUODENOSCOPY  11/2015   Dr. Britta Mccreedy: hiatal hernia  . ESOPHAGOGASTRODUODENOSCOPY (EGD) WITH PROPOFOL N/A 10/26/2017   Procedure: ESOPHAGOGASTRODUODENOSCOPY (EGD) WITH PROPOFOL;  Surgeon: Daneil Dolin, MD;  Location: [facility];  Service: Endoscopy;  Laterality: N/A;  . KNEE ARTHROSCOPY Right   . ORIF ANKLE  FRACTURE Right     Current Outpatient Medications  Medication Sig Dispense Refill  . amLODipine (NORVASC) 2.5 MG tablet Take 2.5 mg by mouth daily.    . Cholecalciferol (VITAMIN D3) 2000 units TABS Take 2,000 Units by mouth daily.    . citalopram (CELEXA) 40 MG tablet Take 40 mg by mouth daily.    . Continuous Blood Gluc Sensor (FREESTYLE LIBRE 14 DAY SENSOR) MISC Inject 1 each into the skin every 14 (fourteen) days. Use as directed. 2 each 2  . docusate sodium (COLACE) 100 MG capsule Take 100 mg by mouth 2 (two) times daily as needed for mild constipation.    . Ferrous Gluconate (IRON 27 PO) Take 1 tablet by mouth daily.    Marland Kitchen gabapentin (NEURONTIN) 800 MG tablet Take 800 mg by mouth 3 (three) times daily.    . Insulin Detemir (LEVEMIR FLEXTOUCH) 100 UNIT/ML Pen Inject 80 Units into the skin at bedtime. 30 mL 2  . insulin lispro (HUMALOG KWIKPEN) 100 UNIT/ML KiwkPen Inject 0.15-0.21 mLs (15-21 Units total) into the skin 3 (three) times daily. 10 pen 2  . latanoprost (XALATAN) 0.005 % ophthalmic solution Place 1 drop into both eyes at bedtime.    Marland Kitchen lisinopril-hydrochlorothiazide (PRINZIDE,ZESTORETIC) 20-12.5 MG tablet Take 1 tablet by mouth 2 (two) times daily.    . ondansetron (ZOFRAN) 4 MG tablet Take  1 tablet (4 mg total) by mouth every 6 (six) hours as needed for nausea or vomiting. 30 tablet 0  . ONETOUCH VERIO test strip USE 1 STRIP TO CHECK GLUCOSE 4 TIMES DAILY 150 each 5  . pantoprazole (PROTONIX) 40 MG tablet Take 40 mg by mouth 2 (two) times daily.     . pravastatin (PRAVACHOL) 40 MG tablet Take 40 mg by mouth daily.    Marland Kitchen pyridOXINE (VITAMIN B-6) 100 MG tablet Take 100 mg by mouth daily.    . ranitidine (ZANTAC) 150 MG capsule Take 150 mg by mouth every evening.    . sucralfate (CARAFATE) 1 g tablet Take 1 tablet (1 g total) by mouth 3 (three) times daily with meals. Crush tablet in 5cc of water prior to taking. 42 tablet 0  . Suvorexant (BELSOMRA) 20 MG TABS Take 20 mg by mouth at  bedtime.     . vitamin B-12 (CYANOCOBALAMIN) 500 MCG tablet Take 500 mcg by mouth daily.     No current facility-administered medications for this visit.     Allergies as of 11/28/2017 - Review Complete 11/28/2017  Allergen Reaction Noted  . Sulfa antibiotics  03/04/2016    Family History  Problem Relation Age of Onset  . Hypertension Mother   . Colon cancer Mother        diagnosed early 40s  . Cerebral aneurysm Mother   . Alcohol abuse Father   . Pancreatic cancer Sister        deceased in 44s  . Colon cancer Sister        diagnosed 56 y/o  . Stroke Brother   . Cancer Maternal Grandmother   . Cancer Maternal Grandfather   . Cancer Paternal Grandmother   . Other Paternal Grandfather   . Other Brother     Social History   Socioeconomic History  . Marital status: Single    Spouse name: Not on file  . Number of children: Not on file  . Years of education: Not on file  . Highest education level: Not on file  Occupational History  . Not on file  Social Needs  . Financial resource strain: Not on file  . Food insecurity:    Worry: Not on file    Inability: Not on file  . Transportation needs:    Medical: Not on file    Non-medical: Not on file  Tobacco Use  . Smoking status: Never Smoker  . Smokeless tobacco: Never Used  Substance and Sexual Activity  . Alcohol use: No  . Drug use: No  . Sexual activity: Never  Lifestyle  . Physical activity:    Days per week: Not on file    Minutes per session: Not on file  . Stress: Not on file  Relationships  . Social connections:    Talks on phone: Not on file    Gets together: Not on file    Attends religious service: Not on file    Active member of club or organization: Not on file    Attends meetings of clubs or organizations: Not on file    Relationship status: Not on file  Other Topics Concern  . Not on file  Social History Narrative  . Not on file    Review of Systems: General: Negative for anorexia,  weight loss, fever, chills, fatigue, weakness. ENT: Negative for hoarseness, difficulty swallowing. CV: Negative for chest pain, angina, palpitations, peripheral edema.  Respiratory: Negative for dyspnea at rest, cough, sputum, wheezing.  GI: See history of present illness. Endo: Negative for unusual weight change.  Heme: Negative for bruising or bleeding.   Physical Exam: BP (!) 165/91   Pulse (!) 102   Temp 97.7 F (36.5 C) (Oral)   Ht _0  (1.575 m)   Wt 214 lb (97.1 kg)   BMI 39.14 kg/m  General:   Alert and oriented. Pleasant and cooperative. Well-nourished and well-developed.  Eyes:  Without icterus, sclera clear and conjunctiva pink.  Ears:  Normal auditory acuity. Cardiovascular:  S1, S2 present without murmurs appreciated. Extremities without clubbing or edema. Respiratory:  Clear to auscultation bilaterally. No wheezes, rales, or rhonchi. No distress.  Gastrointestinal:  +BS, soft, non-tender and non-distended. No HSM noted. No guarding or rebound. No masses appreciated.  Rectal:  Deferred  Musculoskalatal:  Symmetrical without gross deformities. Skin:  Intact without significant lesions or rashes. Neurologic:  Alert and oriented x4;  grossly normal neurologically. Psych:  Alert and cooperative. Normal mood and affect. Heme/Lymph/Immune: No excessive bruising noted.    11/28/2017 10:58 AM   Disclaimer: This note was dictated with voice recognition software. Similar sounding words can inadvertently be transcribed and may not be corrected upon review.

## 2017-11-28 NOTE — Assessment & Plan Note (Signed)
Nausea improved with Carafate although she did have some nausea this morning.  This is likely due to poorly controlled reflux.  We will change her PPI as per above.  Follow-up in 2 months.

## 2017-11-28 NOTE — Assessment & Plan Note (Addendum)
Epigastric abdominal pain likely due to poorly controlled reflux.  She has been on Protonix chronically.  She tried over-the-counter Nexium and this helped much better.  Carafate also helps.  However, there is a potential high risk of QT prolongation with Celexa and Nexium.  I will provide her samples of Dexilant to see if this helps her symptoms.  At this point I will have her hold Protonix, start Dexilant 60 mg daily, use Carafate as needed.  Call in 1 to 2 weeks for progress report.  Return for follow-up in 2 months.  No red flag or warning signs or symptoms.

## 2017-11-28 NOTE — Patient Instructions (Addendum)
1. I have sent in a refill for your nausea medicine (ondansetron or Zofran) 2. I am giving you samples of Dexilant 60 mg to help better control your reflux symptoms and abdominal pain. 3. Call us in 1 to 2 weeks and let us know if this is helping your heartburn symptoms. 4. We cannot use Nexium because it can interact with 1 of your other medications (Celexa). 5. When you start taking Nexium, stop taking Protonix. 6. You continue to use your crushed pill medicine (Carafate or sucralfate) as needed for heartburn symptoms. 7. Return for follow-up in 2 months. 8. Call us if you have any problems, questions, concerns.  At [facility] we value your feedback. You may receive a survey about your visit today. Please share your experience as we strive to create trusting relationships with our patients to provide genuine, compassionate, quality care.  It was a pleasure to see you today!  I hope you have a wonderful summer!!

## 2017-11-28 NOTE — Assessment & Plan Note (Addendum)
Patient describes epigastric abdominal pain, persistent nausea.  She has been on Protonix twice daily.  She was started on Carafate and this is helped her symptoms somewhat.  Zofran helps her nausea.  I will refill her Zofran.  She has tried over-the-counter Nexium which is helped much better than Protonix.  However, there is a potential high severity interaction of QT prolongation with Nexium and her other medications Celexa.  Subsequently, I will provide her samples of Dexilant and request progress report in 1 to 2 weeks.  Continue Carafate as needed.  Follow-up in 2 months.  Call if any recurrent or worsening symptoms before then.

## 2017-11-28 NOTE — Progress Notes (Signed)
cc'ed to pcp °

## 2017-12-11 ENCOUNTER — Telehealth: Payer: Self-pay | Admitting: Internal Medicine

## 2017-12-11 ENCOUNTER — Other Ambulatory Visit (HOSPITAL_COMMUNITY): Payer: Self-pay | Admitting: *Deleted

## 2017-12-11 DIAGNOSIS — D75839 Thrombocytosis, unspecified: Secondary | ICD-10-CM

## 2017-12-11 DIAGNOSIS — D473 Essential (hemorrhagic) thrombocythemia: Secondary | ICD-10-CM

## 2017-12-11 NOTE — Telephone Encounter (Signed)
Spoke with Pt , she would like Dexilant 60mg  sent into her pharmacy. Pt would also like a refill of Carafate.

## 2017-12-11 NOTE — Telephone Encounter (Signed)
Pt called asking to speak to the nurse. I told her the nurse wasn't available and I would have to take a message and have her call her back. She said the Dexilant 60 mg samples were working and she wanted a prescription called into [facility] [place] and can she tale them twice a day instead of once a day. [phone]

## 2017-12-11 NOTE — Telephone Encounter (Signed)
Patient called back and states she needs Rx sent to [facility] [place] [facility]. She also wanted to let you know that she feels sulcrafate helps as well.

## 2017-12-12 ENCOUNTER — Other Ambulatory Visit (HOSPITAL_COMMUNITY): Payer: Medicare Other

## 2017-12-13 ENCOUNTER — Telehealth: Payer: Self-pay | Admitting: Internal Medicine

## 2017-12-13 MED ORDER — SUCRALFATE 1 G PO TABS: 1.0000 g | ORAL_TABLET | Freq: Three times a day (TID) | ORAL | 2 refills | Status: DC

## 2017-12-13 MED ORDER — DEXLANSOPRAZOLE 60 MG PO CPDR: 60.0000 mg | DELAYED_RELEASE_CAPSULE | Freq: Every day | ORAL | 5 refills | Status: DC

## 2017-12-13 NOTE — Telephone Encounter (Signed)
Spoke with pt, second request for mediations. See previous note sent to [facility] refill box.

## 2017-12-13 NOTE — Telephone Encounter (Signed)
PATIENT CALLED AND SAID THE PRESCRIPTIONS SHE CALLED ABOUT Monday ARE STILL NOT AT HER PHARMACY

## 2017-12-13 NOTE — Telephone Encounter (Signed)
Spoke with EG- ok to send in dexilant 60mg  qd #30 with 5 rf and carafate 1 gram tid  #90 2 rf. I have sent these rx's to the pharmacy.

## 2017-12-13 NOTE — Telephone Encounter (Signed)
Patient called again about her prescriptions

## 2017-12-14 NOTE — Telephone Encounter (Signed)
Agree, no further recommendations. 

## 2017-12-14 NOTE — Telephone Encounter (Signed)
See previous note

## 2017-12-19 ENCOUNTER — Other Ambulatory Visit (HOSPITAL_COMMUNITY): Payer: Medicare Other

## 2017-12-19 ENCOUNTER — Ambulatory Visit (HOSPITAL_COMMUNITY): Payer: Medicare Other | Admitting: Hematology

## 2017-12-26 ENCOUNTER — Ambulatory Visit (HOSPITAL_COMMUNITY): Payer: Medicare Other | Admitting: Internal Medicine

## 2017-12-26 ENCOUNTER — Telehealth: Payer: Self-pay | Admitting: Internal Medicine

## 2017-12-26 NOTE — Telephone Encounter (Signed)
Pt called asking for [facility] nurse to call her. She said she isn't feeling any better and her stomach is bothering her. She has an OV in July and is on a call back list if a cancellation becomes available. [phone]

## 2017-12-26 NOTE — Telephone Encounter (Signed)
Spoke with pt and she feels she isn't getting any better. Pt thought for the first few days of Dexilant, she was improving. Pt is taking Dexilant, Carafate and phenergan as need. Pt went to the hospital on Saturday due to being sick with severe nausea. Pt continues had have nausea throughout the day. Pt had a nurse from [facility] visit pt yesterday and feels her hernia is causing her food not to digest. Pt isn't sure what she needs to do but states she isn't getting better with the severe nausea.

## 2017-12-27 ENCOUNTER — Encounter: Payer: Self-pay | Admitting: *Deleted

## 2017-12-27 ENCOUNTER — Other Ambulatory Visit: Payer: Self-pay | Admitting: *Deleted

## 2017-12-27 DIAGNOSIS — R748 Abnormal levels of other serum enzymes: Secondary | ICD-10-CM

## 2017-12-29 NOTE — Telephone Encounter (Signed)
Can we schedule a BPE for her? This will allow [place] to see if the (small) hiatal hernia is affecting passage of oral intake.

## 2018-01-01 NOTE — Telephone Encounter (Signed)
Noted, no further recommendations. 

## 2018-01-01 NOTE — Telephone Encounter (Signed)
Spoke with pt and she went to her PCP last week and he order a test to look at her stomach. Pt said she would prefer to do the procedure offered by her pcp first.

## 2018-01-15 ENCOUNTER — Ambulatory Visit: Payer: Medicare Other | Admitting: Gastroenterology

## 2018-01-18 ENCOUNTER — Ambulatory Visit: Payer: Medicare Other | Admitting: Nutrition

## 2018-01-18 ENCOUNTER — Ambulatory Visit: Payer: Medicare Other | Admitting: "Endocrinology

## 2018-01-24 LAB — HEMOGLOBIN A1C: HEMOGLOBIN A1C: 10.7

## 2018-01-24 LAB — BASIC METABOLIC PANEL
BUN: 9 (ref 4–21)
Creatinine: 0.6 (ref 0.5–1.1)

## 2018-02-16 ENCOUNTER — Ambulatory Visit: Payer: Medicare Other | Admitting: Gastroenterology

## 2018-02-16 ENCOUNTER — Other Ambulatory Visit: Payer: Self-pay | Admitting: Nurse Practitioner

## 2018-02-16 DIAGNOSIS — K219 Gastro-esophageal reflux disease without esophagitis: Secondary | ICD-10-CM

## 2018-02-16 DIAGNOSIS — R1013 Epigastric pain: Secondary | ICD-10-CM

## 2018-02-16 DIAGNOSIS — R110 Nausea: Secondary | ICD-10-CM

## 2018-02-21 ENCOUNTER — Encounter

## 2018-02-21 ENCOUNTER — Ambulatory Visit (INDEPENDENT_AMBULATORY_CARE_PROVIDER_SITE_OTHER): Payer: Medicare Other | Admitting: Nurse Practitioner

## 2018-02-21 ENCOUNTER — Encounter: Payer: Self-pay | Admitting: Nurse Practitioner

## 2018-02-21 VITALS — BP 152/88 | HR 84 | Temp 98.7°F | Ht 62.0 in | Wt 205.8 lb

## 2018-02-21 DIAGNOSIS — R110 Nausea: Secondary | ICD-10-CM

## 2018-02-21 DIAGNOSIS — K219 Gastro-esophageal reflux disease without esophagitis: Secondary | ICD-10-CM | POA: Diagnosis not present

## 2018-02-21 DIAGNOSIS — R748 Abnormal levels of other serum enzymes: Secondary | ICD-10-CM

## 2018-02-21 NOTE — Assessment & Plan Note (Signed)
History of persistent elevated alkaline phosphatase.  She is due for labs about 1 month ago.  I recommended she had her labs drawn and follow-up in 6 months.  We can call her with results and plan further work-up based on her lab results.

## 2018-02-21 NOTE — Patient Instructions (Signed)
1. Continue taking Dexilant once a day on an empty stomach. 2. Stop taking Protonix in the evenings. 3. Have your labs drawn when you leave our office today. 4. Follow-up with your primary care and a diabetic dietitian that you already have scheduled. 5. You can use your nausea medicines as needed. 6. Follow-up with our office in 6 months. 7. Call us if you have any questions or concerns.  We appreciate your understanding and patience as we review any laboratory studies, imaging, and other diagnostic tests that are ordered as we care for you. Our office policy is 0-25 business days for review of these results, and any emergent or urgent results are addressed in a timely manner for your best interest. We also encourage the use of MyChart, which contains your medical information for your review as well. If you are not enrolled in this feature, an access code is available on this after visit summary for your convenience. Thank you for allowing [place] to be involved in the care of you and your family.   At [facility] we value your feedback. You may receive a survey about your visit today. Please share your experience as we strive to create trusting relationships with our patients to provide genuine, compassionate, quality care.  It was great to meet you today!  I hope you have a great summer!!

## 2018-02-21 NOTE — Assessment & Plan Note (Signed)
She does have some improvement in her nausea with improvement in her symptoms.  Antiemetics do work for her when she needs them.  She just found out her hemoglobin A1c is 11 and feels this could be causing some of her persistent nausea and I completely agree.  She is scheduled to see her primary care and a diabetic dietitian in a week.  I recommend she keep these appointments and work to get her glycemic control better.  Follow-up in 6 months, continue antiemetics as needed

## 2018-02-21 NOTE — Assessment & Plan Note (Signed)
GERD symptoms doing better on Dexilant.  She is also inadvertently taking occasional evening Protonix.  I recommended she stop Protonix, continue Dexilant as it is 24-hour release.  Call us if she has any worsening symptoms and we can prescribe other medications such as Zantac or Carafate.  Return for follow-up in 6 months.

## 2018-02-21 NOTE — Progress Notes (Signed)
Referring Provider: Monico Blitz, MD Primary Care Physician:  Monico Blitz, MD Primary GI:  Dr. Gala Romney  Chief Complaint  Patient presents with  . Nausea    usually in the mornings    HPI:   Carrie Turner is a 67 y.o. female who presents for follow-up on GERD, nausea, vomiting.  The patient was last seen in our office 11/28/2017 for the same as well as epigastric abdominal pain.  History of colitis in February 2019 treated with Cipro and Flagyl.  Colonoscopy up-to-date May 2017 by Dr. Britta Mccreedy with a sessile polyp and recommended 5-year repeat (2020).  EGD at the same time found hiatal hernia but otherwise unremarkable.  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  Imodium causes constipation occasionally.  Followed by hematology for leukocytosis and mild iron deficiency.  Chronically elevated alk phos but generally normal AST/ALT historically but normalized recently.  Liver was somewhat fatty appearance on CT in November 2018.  Colonoscopy was updated 10/26/2017 which found a single 5 mm polyp at the hepatic flexure and EGD with distal esophageal erosions, no Barrett's.  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  Random colon biopsies were normal.  Her serologic work-up was negative celiac labs, negative viral hepatitis and autoimmune markers as well as PBC markers.  Recommended repeat LFTs and GGT in 3 months which were already ordered.    At her last visit she was doing well on Carafate but still not resolved.  Is on Protonix twice daily.  Has not tried Nexium or AcipHex.  Feels like Protonix is "not quite doing it."  Epigastric abdominal pain which is typical for her GERD symptoms.  Does have constipation and takes Dulcolax with stools being hard and difficult to pass, intermittent hematochezia due to known hemorrhoids.  The counter Nexium helped better than Protonix.  Recommend  continue Zofran, start Dexilant 60 mg daily, call with a progress report 1 to 2 weeks, continue Carafate as needed, follow-up in 2 months.  She called 2 weeks later requesting a prescription for Dexilant and Carafate.  This was sent to her pharmacy.  He then called a month later stating that initially she did better with Dexilant but then had severe nausea and went to the hospital.  A nurse from [place] [facility] came to her home and feels that her hernia is causing her food not to digest.  Recommended barium pill esophagram.  She declined for the time stating that her primary care had ordered another test and she would prefer that done first.  Updated liver serologies not completed yet.  No available recent imaging in our system.  Today she states she's doing somewhat better. Dexilant is working better. She thinks her N/V is cause by uncontrolled DM (a1c was recently 39). She has some nausea about every 3 days and antiemetic helps. Watches what she eats. Sees a DM nutritionist next week. Rare abdominal pain which is self-limiting. Has alternating constipation and diarrhea which, when she has a bowel movement, her abdominal pain (if present) resolves. PCP recently gave her Linzess for constipation days. She is having more regular bowel movements on Linzess. Denies hematochezia, melena, fever, chills, unintentional weight loss. Denies chest pain, dyspnea, dizziness, lightheadedness, syncope, near syncope. Denies any other upper or lower GI symptoms.  She is taking Protonix in the evenings; advised her to stop evening Protonix.  Past Medical History:  Diagnosis Date  .  Anxiety   . COPD (chronic obstructive pulmonary disease) ([place])   . Depression   . Diabetes mellitus, type II ([place])   . GERD (gastroesophageal reflux disease)   . Glaucoma   . Hyperlipidemia   . Hypertension   . Migraines   . Neuropathy   . Seizures ([place])    seizure was from ETOH, "a long time ago", no med and no recurrance  .  Vitamin D deficiency     Past Surgical History:  Procedure Laterality Date  . ABDOMINAL HYSTERECTOMY    . COLONOSCOPY  11/2015   Dr. Britta Mccreedy: sessile polyp removed (benign). advised repeat colonoscopy in 5 years.   . COLONOSCOPY WITH PROPOFOL N/A 10/26/2017   Procedure: COLONOSCOPY WITH PROPOFOL;  Surgeon: Daneil Dolin, MD;  Location: [facility];  Service: Endoscopy;  Laterality: N/A;  12:45pm  . ESOPHAGEAL DILATION  10/26/2017   Procedure: ESOPHAGEAL DILATION;  Surgeon: Daneil Dolin, MD;  Location: [facility];  Service: Endoscopy;;  . ESOPHAGOGASTRODUODENOSCOPY  11/2015   Dr. Britta Mccreedy: hiatal hernia  . ESOPHAGOGASTRODUODENOSCOPY (EGD) WITH PROPOFOL N/A 10/26/2017   Procedure: ESOPHAGOGASTRODUODENOSCOPY (EGD) WITH PROPOFOL;  Surgeon: Daneil Dolin, MD;  Location: [facility];  Service: Endoscopy;  Laterality: N/A;  . KNEE ARTHROSCOPY Right   . ORIF ANKLE FRACTURE Right     Current Outpatient Medications  Medication Sig Dispense Refill  . amLODipine (NORVASC) 2.5 MG tablet Take 2.5 mg by mouth daily.    . Cholecalciferol (VITAMIN D3) 2000 units TABS Take 2,000 Units by mouth daily.    . citalopram (CELEXA) 40 MG tablet Take 40 mg by mouth daily.    . Continuous Blood Gluc Sensor (FREESTYLE LIBRE 14 DAY SENSOR) MISC Inject 1 each into the skin every 14 (fourteen) days. Use as directed. 2 each 2  . dexlansoprazole (DEXILANT) 60 MG capsule Take 1 capsule (60 mg total) by mouth daily. 30 capsule 5  . docusate sodium (COLACE) 100 MG capsule Take 100 mg by mouth 2 (two) times daily as needed for mild constipation.    . Ferrous Gluconate (IRON 27 PO) Take 1 tablet by mouth daily.    Marland Kitchen gabapentin (NEURONTIN) 800 MG tablet Take 800 mg by mouth 3 (three) times daily.    . Insulin Detemir (LEVEMIR FLEXTOUCH) 100 UNIT/ML Pen Inject 80 Units into the skin at bedtime. 30 mL 2  . insulin lispro (HUMALOG KWIKPEN) 100 UNIT/ML KiwkPen Inject 0.15-0.21 mLs (15-21 Units total) into the skin 3  (three) times daily. 10 pen 2  . latanoprost (XALATAN) 0.005 % ophthalmic solution Place 1 drop into both eyes at bedtime.    Marland Kitchen LINZESS 72 MCG capsule Take 72 mcg by mouth daily.  3  . lisinopril-hydrochlorothiazide (PRINZIDE,ZESTORETIC) 20-12.5 MG tablet Take 1 tablet by mouth 2 (two) times daily.    . ondansetron (ZOFRAN) 4 MG tablet TAKE 1 TABLET BY MOUTH EVERY 6 HOURS AS NEEDED FOR NAUSEA AND VOMITING 30 tablet 2  . ONETOUCH VERIO test strip USE 1 STRIP TO CHECK GLUCOSE 4 TIMES DAILY 150 each 5  . pantoprazole (PROTONIX) 40 MG tablet Take 40 mg by mouth at bedtime.     . pravastatin (PRAVACHOL) 40 MG tablet Take 40 mg by mouth daily.    Marland Kitchen pyridOXINE (VITAMIN B-6) 100 MG tablet Take 100 mg by mouth daily.    . sucralfate (CARAFATE) 1 g tablet Take 1 tablet (1 g total) by mouth 3 (three) times daily with meals. Crush tablet in 5cc of water  prior to taking. (Patient taking differently: Take 1 g by mouth as needed. Crush tablet in 5cc of water prior to taking.) 42 tablet 0  . sucralfate (CARAFATE) 1 g tablet Take 1 tablet (1 g total) by mouth 4 (four) times daily -  with meals and at bedtime. (Patient taking differently: Take 1 g by mouth as needed. ) 90 tablet 2  . Suvorexant (BELSOMRA) 20 MG TABS Take 20 mg by mouth at bedtime.     . vitamin B-12 (CYANOCOBALAMIN) 500 MCG tablet Take 500 mcg by mouth daily.    . ranitidine (ZANTAC) 150 MG capsule Take 150 mg by mouth every evening.     No current facility-administered medications for this visit.     Allergies as of 02/21/2018 - Review Complete 02/21/2018  Allergen Reaction Noted  . Sulfa antibiotics  03/04/2016    Family History  Problem Relation Age of Onset  . Hypertension Mother   . Colon cancer Mother        diagnosed early 68s  . Cerebral aneurysm Mother   . Alcohol abuse Father   . Pancreatic cancer Sister        deceased in 9s  . Colon cancer Sister        diagnosed 54 y/o  . Stroke Brother   . Cancer Maternal Grandmother    . Cancer Maternal Grandfather   . Cancer Paternal Grandmother   . Other Paternal Grandfather   . Other Brother     Social History   Socioeconomic History  . Marital status: Single    Spouse name: Not on file  . Number of children: Not on file  . Years of education: Not on file  . Highest education level: Not on file  Occupational History  . Not on file  Social Needs  . Financial resource strain: Not on file  . Food insecurity:    Worry: Not on file    Inability: Not on file  . Transportation needs:    Medical: Not on file    Non-medical: Not on file  Tobacco Use  . Smoking status: Never Smoker  . Smokeless tobacco: Never Used  Substance and Sexual Activity  . Alcohol use: No  . Drug use: No  . Sexual activity: Never  Lifestyle  . Physical activity:    Days per week: Not on file    Minutes per session: Not on file  . Stress: Not on file  Relationships  . Social connections:    Talks on phone: Not on file    Gets together: Not on file    Attends religious service: Not on file    Active member of club or organization: Not on file    Attends meetings of clubs or organizations: Not on file    Relationship status: Not on file  Other Topics Concern  . Not on file  Social History Narrative  . Not on file    Review of Systems: General: Negative for anorexia, weight loss, fever, chills, fatigue, weakness. ENT: Negative for hoarseness, difficulty swallowing. CV: Negative for chest pain, angina, palpitations, peripheral edema.  Respiratory: Negative for dyspnea at rest, cough, sputum, wheezing.  GI: See history of present illness. Endo: Negative for unusual weight change.  Heme: Negative for bruising or bleeding. Allergy: Negative for rash or hives.   Physical Exam: BP (!) 152/88   Pulse 84   Temp 98.7 F (37.1 C) (Oral)   Ht 5' 2"  (1.575 m)   Wt 205 lb 12.8  oz (93.4 kg)   BMI 37.64 kg/m  General:   Alert and oriented. Pleasant and cooperative.  Well-nourished and well-developed.  Eyes:  Without icterus, sclera clear and conjunctiva pink.  Ears:  Normal auditory acuity. Cardiovascular:  S1, S2 present without murmurs appreciated. Extremities without clubbing or edema. Respiratory:  Clear to auscultation bilaterally. No wheezes, rales, or rhonchi. No distress.  Gastrointestinal:  +BS, soft, non-tender and non-distended. No HSM noted. No guarding or rebound. No masses appreciated.  Rectal:  Deferred  Musculoskalatal:  Symmetrical without gross deformities. Skin:  Intact without significant lesions or rashes. Neurologic:  Alert and oriented x4;  grossly normal neurologically. Psych:  Alert and cooperative. Normal mood and affect. Heme/Lymph/Immune: No excessive bruising noted.    02/21/2018 2:10 PM   Disclaimer: This note was dictated with voice recognition software. Similar sounding words can inadvertently be transcribed and may not be corrected upon review.

## 2018-02-22 LAB — HEPATIC FUNCTION PANEL
AG RATIO: 1.6 (calc) (ref 1.0–2.5)
ALKALINE PHOSPHATASE (APISO): 157 U/L — AB (ref 33–130)
ALT: 17 U/L (ref 6–29)
AST: 19 U/L (ref 10–35)
Albumin: 4.4 g/dL (ref 3.6–5.1)
Bilirubin, Direct: 0.1 mg/dL (ref 0.0–0.2)
Globulin: 2.7 g/dL (calc) (ref 1.9–3.7)
Indirect Bilirubin: 0.2 mg/dL (calc) (ref 0.2–1.2)
TOTAL PROTEIN: 7.1 g/dL (ref 6.1–8.1)
Total Bilirubin: 0.3 mg/dL (ref 0.2–1.2)

## 2018-02-22 LAB — GAMMA GT: GGT: 40 U/L (ref 3–65)

## 2018-02-22 NOTE — Progress Notes (Signed)
CC'ED TO PCP 

## 2018-02-27 ENCOUNTER — Ambulatory Visit (INDEPENDENT_AMBULATORY_CARE_PROVIDER_SITE_OTHER): Payer: Medicare Other | Admitting: "Endocrinology

## 2018-02-27 ENCOUNTER — Encounter: Payer: Self-pay | Admitting: "Endocrinology

## 2018-02-27 VITALS — BP 166/87 | HR 77 | Ht 62.0 in | Wt 206.0 lb

## 2018-02-27 DIAGNOSIS — I10 Essential (primary) hypertension: Secondary | ICD-10-CM

## 2018-02-27 DIAGNOSIS — E782 Mixed hyperlipidemia: Secondary | ICD-10-CM | POA: Diagnosis not present

## 2018-02-27 DIAGNOSIS — E6609 Other obesity due to excess calories: Secondary | ICD-10-CM | POA: Diagnosis not present

## 2018-02-27 DIAGNOSIS — Z91199 Patient's noncompliance with other medical treatment and regimen due to unspecified reason: Secondary | ICD-10-CM

## 2018-02-27 DIAGNOSIS — E1165 Type 2 diabetes mellitus with hyperglycemia: Secondary | ICD-10-CM

## 2018-02-27 DIAGNOSIS — E118 Type 2 diabetes mellitus with unspecified complications: Secondary | ICD-10-CM | POA: Diagnosis not present

## 2018-02-27 DIAGNOSIS — IMO0002 Reserved for concepts with insufficient information to code with codable children: Secondary | ICD-10-CM

## 2018-02-27 DIAGNOSIS — Z794 Long term (current) use of insulin: Secondary | ICD-10-CM

## 2018-02-27 DIAGNOSIS — Z6834 Body mass index (BMI) 34.0-34.9, adult: Secondary | ICD-10-CM

## 2018-02-27 DIAGNOSIS — Z9119 Patient's noncompliance with other medical treatment and regimen: Secondary | ICD-10-CM

## 2018-02-27 NOTE — Patient Instructions (Signed)

## 2018-02-27 NOTE — Progress Notes (Signed)
Endocrinology follow-up note  Subjective:    Patient ID: Carrie Turner, female    DOB: 1950-09-10. Patient is being seen in follow-up for management of diabetes requested by  Monico Blitz, MD  Past Medical History:  Diagnosis Date  . Anxiety   . COPD (chronic obstructive pulmonary disease) ([place])   . Depression   . Diabetes mellitus, type II ([place])   . GERD (gastroesophageal reflux disease)   . Glaucoma   . Hyperlipidemia   . Hypertension   . Migraines   . Neuropathy   . Seizures ([place])    seizure was from ETOH, "a long time ago", no med and no recurrance  . Vitamin D deficiency    Past Surgical History:  Procedure Laterality Date  . ABDOMINAL HYSTERECTOMY    . COLONOSCOPY  11/2015   Dr. Britta Mccreedy: sessile polyp removed (benign). advised repeat colonoscopy in 5 years.   . COLONOSCOPY WITH PROPOFOL N/A 10/26/2017   Procedure: COLONOSCOPY WITH PROPOFOL;  Surgeon: Daneil Dolin, MD;  [facility]: [facility];  Service: Endoscopy;  Laterality: N/A;  12:45pm  . ESOPHAGEAL DILATION  10/26/2017   Procedure: ESOPHAGEAL DILATION;  Surgeon: Daneil Dolin, MD;  Location: [facility];  Service: Endoscopy;;  . ESOPHAGOGASTRODUODENOSCOPY  11/2015   Dr. Britta Mccreedy: hiatal hernia  . ESOPHAGOGASTRODUODENOSCOPY (EGD) WITH PROPOFOL N/A 10/26/2017   Procedure: ESOPHAGOGASTRODUODENOSCOPY (EGD) WITH PROPOFOL;  Surgeon: Daneil Dolin, MD;  Location: [facility];  Service: Endoscopy;  Laterality: N/A;  . KNEE ARTHROSCOPY Right   . ORIF ANKLE FRACTURE Right    Social History   Socioeconomic History  . Marital status: Single    Spouse name: Not on file  . Number of children: Not on file  . Years of education: Not on file  . Highest education level: Not on file  Occupational History  . Not on file  Social Needs  . Financial resource strain: Not on file  . Food insecurity:    Worry: Not on file    Inability: Not on file  . Transportation needs:    Medical: Not on file    Non-medical: Not on  file  Tobacco Use  . Smoking status: Never Smoker  . Smokeless tobacco: Never Used  Substance and Sexual Activity  . Alcohol use: No  . Drug use: No  . Sexual activity: Never  Lifestyle  . Physical activity:    Days per week: Not on file    Minutes per session: Not on file  . Stress: Not on file  Relationships  . Social connections:    Talks on phone: Not on file    Gets together: Not on file    Attends religious service: Not on file    Active member of club or organization: Not on file    Attends meetings of clubs or organizations: Not on file    Relationship status: Not on file  Other Topics Concern  . Not on file  Social History Narrative  . Not on file   Outpatient Encounter Medications as of 02/27/2018  Medication Sig  . amLODipine (NORVASC) 2.5 MG tablet Take 2.5 mg by mouth daily.  . Cholecalciferol (VITAMIN D3) 2000 units TABS Take 2,000 Units by mouth daily.  . citalopram (CELEXA) 40 MG tablet Take 40 mg by mouth daily.  . Continuous Blood Gluc Sensor (FREESTYLE LIBRE 14 DAY SENSOR) MISC Inject 1 each into the skin every 14 (fourteen) days. Use as directed.  Marland Kitchen dexlansoprazole (DEXILANT) 60 MG capsule Take  1 capsule (60 mg total) by mouth daily.  Marland Kitchen docusate sodium (COLACE) 100 MG capsule Take 100 mg by mouth 2 (two) times daily as needed for mild constipation.  . Ferrous Gluconate (IRON 27 PO) Take 1 tablet by mouth daily.  Marland Kitchen gabapentin (NEURONTIN) 800 MG tablet Take 800 mg by mouth 3 (three) times daily.  . Insulin Detemir (LEVEMIR FLEXTOUCH) 100 UNIT/ML Pen Inject 80 Units into the skin at bedtime.  . insulin lispro (HUMALOG KWIKPEN) 100 UNIT/ML KiwkPen Inject 0.15-0.21 mLs (15-21 Units total) into the skin 3 (three) times daily.  Marland Kitchen latanoprost (XALATAN) 0.005 % ophthalmic solution Place 1 drop into both eyes at bedtime.  Marland Kitchen LINZESS 72 MCG capsule Take 72 mcg by mouth daily.  Marland Kitchen lisinopril-hydrochlorothiazide (PRINZIDE,ZESTORETIC) 20-12.5 MG tablet Take 1 tablet by mouth  2 (two) times daily.  . ondansetron (ZOFRAN) 4 MG tablet TAKE 1 TABLET BY MOUTH EVERY 6 HOURS AS NEEDED FOR NAUSEA AND VOMITING  . ONETOUCH VERIO test strip USE 1 STRIP TO CHECK GLUCOSE 4 TIMES DAILY  . pantoprazole (PROTONIX) 40 MG tablet Take 40 mg by mouth at bedtime.   . pravastatin (PRAVACHOL) 40 MG tablet Take 40 mg by mouth daily.  Marland Kitchen pyridOXINE (VITAMIN B-6) 100 MG tablet Take 100 mg by mouth daily.  . ranitidine (ZANTAC) 150 MG capsule Take 150 mg by mouth every evening.  . sucralfate (CARAFATE) 1 g tablet Take 1 tablet (1 g total) by mouth 3 (three) times daily with meals. Crush tablet in 5cc of water prior to taking. (Patient taking differently: Take 1 g by mouth as needed. Crush tablet in 5cc of water prior to taking.)  . sucralfate (CARAFATE) 1 g tablet Take 1 tablet (1 g total) by mouth 4 (four) times daily -  with meals and at bedtime. (Patient taking differently: Take 1 g by mouth as needed. )  . Suvorexant (BELSOMRA) 20 MG TABS Take 20 mg by mouth at bedtime.   . vitamin B-12 (CYANOCOBALAMIN) 500 MCG tablet Take 500 mcg by mouth daily.   No facility-administered encounter medications on file as of 02/27/2018.    ALLERGIES: Allergies  Allergen Reactions  . Sulfa Antibiotics    VACCINATION STATUS:  There is no immunization history on file for this patient.  Diabetes  She presents for her follow-up diabetic visit. She has type 2 diabetes mellitus. Onset time: She was diagnosed at approximate age of 67 years. Her disease course has been worsening. There are no hypoglycemic associated symptoms. Pertinent negatives for hypoglycemia include no confusion, headaches, pallor or seizures. Associated symptoms include fatigue, polydipsia and polyuria. Pertinent negatives for diabetes include no blurred vision, no chest pain and no polyphagia. There are no hypoglycemic complications. Symptoms are worsening. There are no diabetic complications. Risk factors for coronary artery disease include  diabetes mellitus, dyslipidemia, obesity and sedentary lifestyle. Current diabetic treatment includes insulin injections (She did not freeze her insulin dose as recommended during her last visit.). She is compliant with treatment some of the time. Her weight is fluctuating minimally. She is following a generally unhealthy diet. When asked about meal planning, she reported none. She has had a previous visit with a dietitian (She will see the dietitian today.). She never participates in exercise. Her home blood glucose trend is decreasing steadily. (She brought some logs from the long time ago, nothing recent.  Her A1c is 10.5%.  She cannot confirm to me if she has been taking insulin as prescribed.) An ACE inhibitor/angiotensin II receptor blocker  is not being taken. Eye exam is current.  Hyperlipidemia  This is a chronic problem. The current episode started more than 1 year ago. Recent lipid tests were reviewed and are high. Exacerbating diseases include diabetes and obesity. Pertinent negatives include no chest pain, myalgias or shortness of breath. She is currently on no antihyperlipidemic treatment. Compliance problems include adherence to diet and adherence to exercise.  Risk factors for coronary artery disease include diabetes mellitus, dyslipidemia, hypertension, obesity and a sedentary lifestyle.  Hypertension  This is a chronic problem. The current episode started more than 1 year ago. The problem is controlled. Pertinent negatives include no blurred vision, chest pain, headaches, palpitations or shortness of breath. Risk factors for coronary artery disease include diabetes mellitus, dyslipidemia and sedentary lifestyle. Past treatments include calcium channel blockers. Compliance problems include diet and exercise.     Review of Systems  Constitutional: Positive for fatigue. Negative for unexpected weight change.  HENT: Negative for trouble swallowing and voice change.   Eyes: Negative for blurred  vision and visual disturbance.  Respiratory: Negative for cough, shortness of breath and wheezing.   Cardiovascular: Negative for chest pain, palpitations and leg swelling.  Gastrointestinal: Negative for diarrhea, nausea and vomiting.  Endocrine: Positive for polydipsia and polyuria. Negative for cold intolerance, heat intolerance and polyphagia.  Genitourinary: Positive for frequency. Negative for dysuria and flank pain.  Musculoskeletal: Negative for arthralgias and myalgias.  Skin: Negative for color change, pallor, rash and wound.  Neurological: Negative for seizures and headaches.  Psychiatric/Behavioral: Negative for confusion and suicidal ideas.    Objective:    BP (!) 166/87   Pulse 77   Ht 5\' 2"  (1.575 m)   Wt 206 lb (93.4 kg)   BMI 37.68 kg/m   Wt Readings from Last 3 Encounters:  02/27/18 206 lb (93.4 kg)  02/21/18 205 lb 12.8 oz (93.4 kg)  11/28/17 214 lb (97.1 kg)    Physical Exam  Constitutional: She is oriented to person, place, and time. She appears well-developed.  HENT:  Head: Normocephalic and atraumatic.  Eyes: EOM are normal.  Neck: Normal range of motion. Neck supple. No tracheal deviation present. No thyromegaly present.  Cardiovascular: Normal rate.  Pulmonary/Chest: Effort normal.  Abdominal: There is no tenderness. There is no guarding.  Musculoskeletal: Normal range of motion. She exhibits no edema.  Neurological: She is alert and oriented to person, place, and time. No cranial nerve deficit. Coordination normal.  Skin: Skin is warm and dry. No rash noted. No erythema. No pallor.  Psychiatric: She has a normal mood and affect. Judgment normal.  Reluctant affect.   Labs: Labs from January 24, 2018 showed BUN 9,  Creatinine 0.58, A1c 10.7% September 20, 2017 A1c 10.5% 05/22/2017 show A1c of 9.5% 02/22/2017 show A1c of 9.5% 08/08/2016 show A1c of 9.6%. 04/20/2016: Her A1c was 11.4% A1c was 10.3% on 01/12/2016,   Recent Results (from the past 2160  hour(s))  Gamma GT     Status: None   Collection Time: 02/21/18  2:39 PM  Result Value Ref Range   GGT 40 3 - 65 U/L  Hepatic function panel     Status: Abnormal   Collection Time: 02/21/18  2:39 PM  Result Value Ref Range   Total Protein 7.1 6.1 - 8.1 g/dL   Albumin 4.4 3.6 - 5.1 g/dL   Globulin 2.7 1.9 - 3.7 g/dL (calc)   AG Ratio 1.6 1.0 - 2.5 (calc)   Total Bilirubin 0.3 0.2 - 1.2  mg/dL   Bilirubin, Direct 0.1 0.0 - 0.2 mg/dL   Indirect Bilirubin 0.2 0.2 - 1.2 mg/dL (calc)   Alkaline phosphatase (APISO) 157 (H) 33 - 130 U/L   AST 19 10 - 35 U/L   ALT 17 6 - 29 U/L   Recent Results (from the past 2160 hour(s))  Basic metabolic panel     Status: None   Collection Time: 01/24/18 12:00 AM  Result Value Ref Range   BUN 9 4 - 21   Creatinine 0.6 0.5 - 1.1  Hemoglobin A1c     Status: None   Collection Time: 01/24/18 12:00 AM  Result Value Ref Range   Hemoglobin A1C 10.7   Gamma GT     Status: None   Collection Time: 02/21/18  2:39 PM  Result Value Ref Range   GGT 40 3 - 65 U/L  Hepatic function panel     Status: Abnormal   Collection Time: 02/21/18  2:39 PM  Result Value Ref Range   Total Protein 7.1 6.1 - 8.1 g/dL   Albumin 4.4 3.6 - 5.1 g/dL   Globulin 2.7 1.9 - 3.7 g/dL (calc)   AG Ratio 1.6 1.0 - 2.5 (calc)   Total Bilirubin 0.3 0.2 - 1.2 mg/dL   Bilirubin, Direct 0.1 0.0 - 0.2 mg/dL   Indirect Bilirubin 0.2 0.2 - 1.2 mg/dL (calc)   Alkaline phosphatase (APISO) 157 (H) 33 - 130 U/L   AST 19 10 - 35 U/L   ALT 17 6 - 29 U/L     Assessment & Plan:   1. Uncontrolled type 2 diabetes mellitus with complication, with long-term current use of insulin ([place])  - Patient has currently uncontrolled symptomatic type 2 DM since  67 years of age. -This patient presents with logs which are from last year, cannot confirm if she is taking insulin as ordered.  Her previsit A1c is 10.7%  unchanged from her last visit A1c of 10.5%.   -Recent labs are reviewed, showing normal renal  function.   Her diabetes is complicated by obesity and sedentary life and patient remains at a high risk for more acute and chronic complications of diabetes which include CAD, CVA, CKD, retinopathy, and neuropathy. These are all discussed in detail with the patient.  - I have counseled the patient on diet management and weight loss, by adopting a carbohydrate restricted/protein rich diet.  -She admits to significant dietary indiscretion.  -  Suggestion is made for her to avoid simple carbohydrates  from her diet including Cakes, Sweet Desserts / Pastries, Ice Cream, Soda (diet and regular), Sweet Tea, Candies, Chips, Cookies, Store Bought Juices, Alcohol in Excess of  1-2 drinks a day, Artificial Sweeteners, and "Sugar-free" Products. This will help patient to have stable blood glucose profile and potentially avoid unintended weight gain.  - I encouraged the patient to switch to  unprocessed or minimally processed complex starch and increased protein intake (animal or plant source), fruits, and vegetables.  - Patient is advised to stick to a routine mealtimes to eat 3 meals  a day and avoid unnecessary snacks ( to snack only to correct hypoglycemia).   - I have approached patient with the following individualized plan to manage diabetes and patient agrees:   -she is struggling to achieve control of diabetes, likely deals with moderate cognitive deficit.    -Without her commitment for proper monitoring and documenting, it is risky to keep increasing her insulin.   -I reapproached her the same insulin  program involving basal insulin Levemir 80 units nightly, prandial insulin NovoLog 15 units  3 times daily before meals for pre-meal BG readings of 90 -150mg /dl, plus patient specific correction dose for unexpected hyperglycemia above 150mg /dl, associated with strict monitoring of glucose 4 times a day-before meals and at bedtime. - Patient is warned not to take insulin without proper monitoring per  orders.  -Patient is encouraged to call clinic for blood glucose levels less than 70 or above 300 mg /dl. -She did not tolerate even low-dose metformin.   -She is not a reliable candidate for incretin therapy.    - Patient specific target  A1c;  LDL, HDL, Triglycerides, and  Waist Circumference were discussed in detail.  2) BP/HTN: Her blood pressure is not controlled to target.  Her blood pressure is not controlled to target.  She is currently on lisinopril-hydrochlorothiazide 20-12.5 mg p.o. daily, amlodipine 2.5 mg p.o. daily.  She will be considered for higher dose of amlodipine next visit. 3) Lipids/HPL:  Control unknown, I will obtain fasting lipid panel on subsequent visits.  She is currently on pravastatin 40 mg p.o. nightly.    4)  obesity: CDE Consult in progress , exercise, and detailed carbohydrates information provided.  5) Chronic Care/Health Maintenance:  -Patient is encouraged to continue to follow up with Ophthalmology, Podiatrist at least yearly or according to recommendations, and advised to   stay away from smoking. I have recommended yearly flu vaccine and pneumonia vaccination at least every 5 years; moderate intensity exercise for up to 150 minutes weekly; and  sleep for at least 7 hours a day.  - I advised patient to maintain close follow up with Monico Blitz, MD for primary care needs.  - Time spent with the patient: 25 min, of which >50% was spent in reviewing her  current and  previous labs, previous treatments, and medications doses and developing a plan for long-term care.  Orlena Sheldon participated in the discussions, expressed understanding, and voiced agreement with the above plans.  All questions were answered to her satisfaction. she is encouraged to contact clinic should she have any questions or concerns prior to her return visit.  Follow up plan: - Return in about 10 days (around 03/09/2018) for Follow up with Meter and Logs Only - no Labs.  Glade Lloyd, MD Phone: [phone]  Fax: [phone]  -  This note was partially dictated with voice recognition software. Similar sounding words can be transcribed inadequately or may not  be corrected upon review.  02/27/2018, 5:37 PM

## 2018-02-28 ENCOUNTER — Other Ambulatory Visit: Payer: Self-pay

## 2018-02-28 MED ORDER — ONETOUCH VERIO W/DEVICE KIT: 1.0000 | PACK | Freq: Four times a day (QID) | 0 refills | Status: DC

## 2018-03-12 ENCOUNTER — Other Ambulatory Visit: Payer: Self-pay

## 2018-03-12 DIAGNOSIS — R945 Abnormal results of liver function studies: Principal | ICD-10-CM

## 2018-03-12 DIAGNOSIS — R7989 Other specified abnormal findings of blood chemistry: Secondary | ICD-10-CM

## 2018-03-12 NOTE — Progress Notes (Signed)
CC'D TO DR.NIDA

## 2018-03-13 ENCOUNTER — Encounter: Payer: Self-pay | Admitting: "Endocrinology

## 2018-03-13 ENCOUNTER — Ambulatory Visit (INDEPENDENT_AMBULATORY_CARE_PROVIDER_SITE_OTHER): Payer: Medicare Other | Admitting: "Endocrinology

## 2018-03-13 VITALS — BP 134/81 | HR 90 | Ht 62.0 in | Wt 207.0 lb

## 2018-03-13 DIAGNOSIS — I10 Essential (primary) hypertension: Secondary | ICD-10-CM | POA: Diagnosis not present

## 2018-03-13 DIAGNOSIS — E1165 Type 2 diabetes mellitus with hyperglycemia: Secondary | ICD-10-CM | POA: Diagnosis not present

## 2018-03-13 DIAGNOSIS — E782 Mixed hyperlipidemia: Secondary | ICD-10-CM | POA: Diagnosis not present

## 2018-03-13 DIAGNOSIS — Z794 Long term (current) use of insulin: Secondary | ICD-10-CM

## 2018-03-13 DIAGNOSIS — E118 Type 2 diabetes mellitus with unspecified complications: Secondary | ICD-10-CM | POA: Diagnosis not present

## 2018-03-13 DIAGNOSIS — IMO0002 Reserved for concepts with insufficient information to code with codable children: Secondary | ICD-10-CM

## 2018-03-13 MED ORDER — INSULIN LISPRO 100 UNIT/ML (KWIKPEN): 18.0000 [IU] | PEN_INJECTOR | Freq: Three times a day (TID) | SUBCUTANEOUS | 2 refills | Status: DC

## 2018-03-13 NOTE — Patient Instructions (Signed)

## 2018-03-13 NOTE — Progress Notes (Signed)
Endocrinology follow-up note  Subjective:    Patient ID: Carrie Turner, female    DOB: 1951/04/15. Patient is being seen in follow-up for management of diabetes requested by  Monico Blitz, MD  Past Medical History:  Diagnosis Date  . Anxiety   . COPD (chronic obstructive pulmonary disease) ([place])   . Depression   . Diabetes mellitus, type II ([place])   . GERD (gastroesophageal reflux disease)   . Glaucoma   . Hyperlipidemia   . Hypertension   . Migraines   . Neuropathy   . Seizures ([place])    seizure was from ETOH, "a long time ago", no med and no recurrance  . Vitamin D deficiency    Past Surgical History:  Procedure Laterality Date  . ABDOMINAL HYSTERECTOMY    . COLONOSCOPY  11/2015   Dr. Britta Mccreedy: sessile polyp removed (benign). advised repeat colonoscopy in 5 years.   . COLONOSCOPY WITH PROPOFOL N/A 10/26/2017   Procedure: COLONOSCOPY WITH PROPOFOL;  Surgeon: Daneil Dolin, MD;  [facility]: [facility];  Service: Endoscopy;  Laterality: N/A;  12:45pm  . ESOPHAGEAL DILATION  10/26/2017   Procedure: ESOPHAGEAL DILATION;  Surgeon: Daneil Dolin, MD;  Location: [facility];  Service: Endoscopy;;  . ESOPHAGOGASTRODUODENOSCOPY  11/2015   Dr. Britta Mccreedy: hiatal hernia  . ESOPHAGOGASTRODUODENOSCOPY (EGD) WITH PROPOFOL N/A 10/26/2017   Procedure: ESOPHAGOGASTRODUODENOSCOPY (EGD) WITH PROPOFOL;  Surgeon: Daneil Dolin, MD;  Location: [facility];  Service: Endoscopy;  Laterality: N/A;  . KNEE ARTHROSCOPY Right   . ORIF ANKLE FRACTURE Right    Social History   Socioeconomic History  . Marital status: Single    Spouse name: Not on file  . Number of children: Not on file  . Years of education: Not on file  . Highest education level: Not on file  Occupational History  . Not on file  Social Needs  . Financial resource strain: Not on file  . Food insecurity:    Worry: Not on file    Inability: Not on file  . Transportation needs:    Medical: Not on file    Non-medical: Not on  file  Tobacco Use  . Smoking status: Never Smoker  . Smokeless tobacco: Never Used  Substance and Sexual Activity  . Alcohol use: No  . Drug use: No  . Sexual activity: Never  Lifestyle  . Physical activity:    Days per week: Not on file    Minutes per session: Not on file  . Stress: Not on file  Relationships  . Social connections:    Talks on phone: Not on file    Gets together: Not on file    Attends religious service: Not on file    Active member of club or organization: Not on file    Attends meetings of clubs or organizations: Not on file    Relationship status: Not on file  Other Topics Concern  . Not on file  Social History Narrative  . Not on file   Outpatient Encounter Medications as of 03/13/2018  Medication Sig  . amLODipine (NORVASC) 2.5 MG tablet Take 2.5 mg by mouth daily.  . Blood Glucose Monitoring Suppl (ONETOUCH VERIO) w/Device KIT 1 each by Does not apply route 4 (four) times daily. E11.65  . Cholecalciferol (VITAMIN D3) 2000 units TABS Take 2,000 Units by mouth daily.  . citalopram (CELEXA) 40 MG tablet Take 40 mg by mouth daily.  . Continuous Blood Gluc Sensor (FREESTYLE LIBRE 14 DAY SENSOR)  MISC Inject 1 each into the skin every 14 (fourteen) days. Use as directed.  Marland Kitchen dexlansoprazole (DEXILANT) 60 MG capsule Take 1 capsule (60 mg total) by mouth daily.  Marland Kitchen docusate sodium (COLACE) 100 MG capsule Take 100 mg by mouth 2 (two) times daily as needed for mild constipation.  . Ferrous Gluconate (IRON 27 PO) Take 1 tablet by mouth daily.  Marland Kitchen gabapentin (NEURONTIN) 800 MG tablet Take 800 mg by mouth 3 (three) times daily.  . Insulin Detemir (LEVEMIR FLEXTOUCH) 100 UNIT/ML Pen Inject 80 Units into the skin at bedtime.  . insulin lispro (HUMALOG KWIKPEN) 100 UNIT/ML KiwkPen Inject 0.18-0.24 mLs (18-24 Units total) into the skin 3 (three) times daily.  Marland Kitchen latanoprost (XALATAN) 0.005 % ophthalmic solution Place 1 drop into both eyes at bedtime.  Marland Kitchen LINZESS 72 MCG capsule  Take 72 mcg by mouth daily.  Marland Kitchen lisinopril-hydrochlorothiazide (PRINZIDE,ZESTORETIC) 20-12.5 MG tablet Take 1 tablet by mouth 2 (two) times daily.  . ondansetron (ZOFRAN) 4 MG tablet TAKE 1 TABLET BY MOUTH EVERY 6 HOURS AS NEEDED FOR NAUSEA AND VOMITING  . ONETOUCH VERIO test strip USE 1 STRIP TO CHECK GLUCOSE 4 TIMES DAILY  . pantoprazole (PROTONIX) 40 MG tablet Take 40 mg by mouth at bedtime.   . pravastatin (PRAVACHOL) 40 MG tablet Take 40 mg by mouth daily.  Marland Kitchen pyridOXINE (VITAMIN B-6) 100 MG tablet Take 100 mg by mouth daily.  . ranitidine (ZANTAC) 150 MG capsule Take 150 mg by mouth every evening.  . sucralfate (CARAFATE) 1 g tablet Take 1 tablet (1 g total) by mouth 3 (three) times daily with meals. Crush tablet in 5cc of water prior to taking. (Patient taking differently: Take 1 g by mouth as needed. Crush tablet in 5cc of water prior to taking.)  . sucralfate (CARAFATE) 1 g tablet Take 1 tablet (1 g total) by mouth 4 (four) times daily -  with meals and at bedtime. (Patient taking differently: Take 1 g by mouth as needed. )  . Suvorexant (BELSOMRA) 20 MG TABS Take 20 mg by mouth at bedtime.   . vitamin B-12 (CYANOCOBALAMIN) 500 MCG tablet Take 500 mcg by mouth daily.  . [DISCONTINUED] insulin lispro (HUMALOG KWIKPEN) 100 UNIT/ML KiwkPen Inject 0.15-0.21 mLs (15-21 Units total) into the skin 3 (three) times daily.   No facility-administered encounter medications on file as of 03/13/2018.    ALLERGIES: Allergies  Allergen Reactions  . Sulfa Antibiotics    VACCINATION STATUS:  There is no immunization history on file for this patient.  Diabetes  She presents for her follow-up diabetic visit. She has type 2 diabetes mellitus. Onset time: She was diagnosed at approximate age of 66 years. Her disease course has been worsening. There are no hypoglycemic associated symptoms. Pertinent negatives for hypoglycemia include no confusion, headaches, pallor or seizures. Associated symptoms include  fatigue, polydipsia and polyuria. Pertinent negatives for diabetes include no blurred vision, no chest pain and no polyphagia. There are no hypoglycemic complications. Symptoms are worsening. There are no diabetic complications. Risk factors for coronary artery disease include diabetes mellitus, dyslipidemia, obesity and sedentary lifestyle. Current diabetic treatment includes insulin injections (She did not freeze her insulin dose as recommended during her last visit.). She is compliant with treatment some of the time. Her weight is fluctuating minimally. She is following a generally unhealthy diet. When asked about meal planning, she reported none. She has had a previous visit with a dietitian (She will see the dietitian today.). She never participates in  exercise. Her home blood glucose trend is decreasing steadily. Her breakfast blood glucose range is generally >200 mg/dl. Her lunch blood glucose range is generally 180-200 mg/dl. Her dinner blood glucose range is generally 180-200 mg/dl. Her bedtime blood glucose range is generally >200 mg/dl. Her overall blood glucose range is >200 mg/dl. (She brings in her logs showing persistently above target glycemic profile and recent A1c of 10.7%.  She did not bring her meter to review.  ) An ACE inhibitor/angiotensin II receptor blocker is not being taken. Eye exam is current.  Hyperlipidemia  This is a chronic problem. The current episode started more than 1 year ago. Recent lipid tests were reviewed and are high. Exacerbating diseases include diabetes and obesity. Pertinent negatives include no chest pain, myalgias or shortness of breath. She is currently on no antihyperlipidemic treatment. Compliance problems include adherence to diet and adherence to exercise.  Risk factors for coronary artery disease include diabetes mellitus, dyslipidemia, hypertension, obesity and a sedentary lifestyle.  Hypertension  This is a chronic problem. The current episode started more  than 1 year ago. The problem is controlled. Pertinent negatives include no blurred vision, chest pain, headaches, palpitations or shortness of breath. Risk factors for coronary artery disease include diabetes mellitus, dyslipidemia and sedentary lifestyle. Past treatments include calcium channel blockers. Compliance problems include diet and exercise.     Review of Systems  Constitutional: Positive for fatigue. Negative for unexpected weight change.  HENT: Negative for trouble swallowing and voice change.   Eyes: Negative for blurred vision and visual disturbance.  Respiratory: Negative for cough, shortness of breath and wheezing.   Cardiovascular: Negative for chest pain, palpitations and leg swelling.  Gastrointestinal: Negative for diarrhea, nausea and vomiting.  Endocrine: Positive for polydipsia and polyuria. Negative for cold intolerance, heat intolerance and polyphagia.  Genitourinary: Positive for frequency. Negative for dysuria and flank pain.  Musculoskeletal: Negative for arthralgias and myalgias.  Skin: Negative for color change, pallor, rash and wound.  Neurological: Negative for seizures and headaches.  Psychiatric/Behavioral: Negative for confusion and suicidal ideas.    Objective:    BP 134/81   Pulse 90   Ht 5' 2"  (1.575 m)   Wt 207 lb (93.9 kg)   BMI 37.86 kg/m   Wt Readings from Last 3 Encounters:  03/13/18 207 lb (93.9 kg)  02/27/18 206 lb (93.4 kg)  02/21/18 205 lb 12.8 oz (93.4 kg)    Physical Exam  Constitutional: She is oriented to person, place, and time. She appears well-developed.  HENT:  Head: Normocephalic and atraumatic.  Eyes: EOM are normal.  Neck: Normal range of motion. Neck supple. No tracheal deviation present. No thyromegaly present.  Cardiovascular: Normal rate.  Pulmonary/Chest: Effort normal.  Abdominal: There is no tenderness. There is no guarding.  Musculoskeletal: Normal range of motion. She exhibits no edema.  Neurological: She is  alert and oriented to person, place, and time. No cranial nerve deficit. Coordination normal.  Skin: Skin is warm and dry. No rash noted. No erythema. No pallor.  Psychiatric: She has a normal mood and affect. Judgment normal.  Reluctant affect.   Labs: Labs from January 24, 2018 showed BUN 9,  Creatinine 0.58, A1c 10.7% September 20, 2017 A1c 10.5% 05/22/2017 show A1c of 9.5% 02/22/2017 show A1c of 9.5% 08/08/2016 show A1c of 9.6%. 04/20/2016: Her A1c was 11.4% A1c was 10.3% on 01/12/2016,   Recent Results (from the past 2160 hour(s))  Basic metabolic panel     Status: None  Collection Time: 01/24/18 12:00 AM  Result Value Ref Range   BUN 9 4 - 21   Creatinine 0.6 0.5 - 1.1  Hemoglobin A1c     Status: None   Collection Time: 01/24/18 12:00 AM  Result Value Ref Range   Hemoglobin A1C 10.7   Gamma GT     Status: None   Collection Time: 02/21/18  2:39 PM  Result Value Ref Range   GGT 40 3 - 65 U/L  Hepatic function panel     Status: Abnormal   Collection Time: 02/21/18  2:39 PM  Result Value Ref Range   Total Protein 7.1 6.1 - 8.1 g/dL   Albumin 4.4 3.6 - 5.1 g/dL   Globulin 2.7 1.9 - 3.7 g/dL (calc)   AG Ratio 1.6 1.0 - 2.5 (calc)   Total Bilirubin 0.3 0.2 - 1.2 mg/dL   Bilirubin, Direct 0.1 0.0 - 0.2 mg/dL   Indirect Bilirubin 0.2 0.2 - 1.2 mg/dL (calc)   Alkaline phosphatase (APISO) 157 (H) 33 - 130 U/L   AST 19 10 - 35 U/L   ALT 17 6 - 29 U/L   Recent Results (from the past 2160 hour(s))  Basic metabolic panel     Status: None   Collection Time: 01/24/18 12:00 AM  Result Value Ref Range   BUN 9 4 - 21   Creatinine 0.6 0.5 - 1.1  Hemoglobin A1c     Status: None   Collection Time: 01/24/18 12:00 AM  Result Value Ref Range   Hemoglobin A1C 10.7   Gamma GT     Status: None   Collection Time: 02/21/18  2:39 PM  Result Value Ref Range   GGT 40 3 - 65 U/L  Hepatic function panel     Status: Abnormal   Collection Time: 02/21/18  2:39 PM  Result Value Ref Range    Total Protein 7.1 6.1 - 8.1 g/dL   Albumin 4.4 3.6 - 5.1 g/dL   Globulin 2.7 1.9 - 3.7 g/dL (calc)   AG Ratio 1.6 1.0 - 2.5 (calc)   Total Bilirubin 0.3 0.2 - 1.2 mg/dL   Bilirubin, Direct 0.1 0.0 - 0.2 mg/dL   Indirect Bilirubin 0.2 0.2 - 1.2 mg/dL (calc)   Alkaline phosphatase (APISO) 157 (H) 33 - 130 U/L   AST 19 10 - 35 U/L   ALT 17 6 - 29 U/L     Assessment & Plan:   1. Uncontrolled type 2 diabetes mellitus with complication, with long-term current use of insulin ([place])  - Patient has currently uncontrolled symptomatic type 2 DM since  67 years of age. -This patient presents with logs which show significantly above target glycemic profile and sent A1c of 10.7%.    -Recent labs are reviewed, showing normal renal function.   Her diabetes is complicated by noncompliance/nonadherence, obesity and sedentary life and patient remains at a high risk for more acute and chronic complications of diabetes which include CAD, CVA, CKD, retinopathy, and neuropathy. These are all discussed in detail with the patient.  - I have counseled the patient on diet management and weight loss, by adopting a carbohydrate restricted/protein rich diet.  -She admits to significant dietary indiscretion.  -  Suggestion is made for her to avoid simple carbohydrates  from her diet including Cakes, Sweet Desserts / Pastries, Ice Cream, Soda (diet and regular), Sweet Tea, Candies, Chips, Cookies, Store Bought Juices, Alcohol in Excess of  1-2 drinks a day, Artificial Sweeteners, and "Sugar-free" Products. This  will help patient to have stable blood glucose profile and potentially avoid unintended weight gain.   - I encouraged the patient to switch to  unprocessed or minimally processed complex starch and increased protein intake (animal or plant source), fruits, and vegetables.  - Patient is advised to stick to a routine mealtimes to eat 3 meals  a day and avoid unnecessary snacks ( to snack only to correct  hypoglycemia).   - I have approached patient with the following individualized plan to manage diabetes and patient agrees:   -she is struggling to achieve control of diabetes, likely deals with moderate cognitive deficit.    -I discussed with her the need for continued requirement for intensive treatment with basal/bolus insulin in order for her to achieve control of diabetes.   -I advised her to continue basal insulin Levemir 80 units nightly, increase Humalog to 18 units  3 times daily before meals for pre-meal BG readings of 90 -160m/dl, plus patient specific correction dose for unexpected hyperglycemia above 1570mdl, associated with strict monitoring of glucose 4 times a day-before meals and at bedtime. - Patient is warned not to take insulin without proper monitoring per orders.  -Patient is encouraged to call clinic for blood glucose levels less than 70 or above 300 mg /dl. -She did not tolerate even low-dose metformin.   -She is not a reliable candidate for incretin therapy.    - Patient specific target  A1c;  LDL, HDL, Triglycerides, and  Waist Circumference were discussed in detail.  2) BP/HTN: Her blood pressure is controlled to target.    She is currently on lisinopril-hydrochlorothiazide 20-12.5 mg p.o. daily, amlodipine 2.5 mg p.o. daily.   3) Lipids/HPL:  Control unknown, I will obtain fasting lipid panel on subsequent visits.  She is advised to continue pravastatin 40 mg p.o. nightly.     4)  obesity: CDE Consult in progress , exercise, and detailed carbohydrates information provided.  5) Chronic Care/Health Maintenance:  -Patient is encouraged to continue to follow up with Ophthalmology, Podiatrist at least yearly or according to recommendations, and advised to   stay away from smoking. I have recommended yearly flu vaccine and pneumonia vaccination at least every 5 years; moderate intensity exercise for up to 150 minutes weekly; and  sleep for at least 7 hours a  day.  - I advised patient to maintain close follow up with ShMonico BlitzMD for primary care needs.   - Time spent with the patient: 25 min, of which >50% was spent in reviewing her blood glucose logs , discussing her hypo- and hyper-glycemic episodes, reviewing her current and  previous labs and insulin doses and developing a plan to avoid hypo- and hyper-glycemia. Please refer to Patient Instructions for Blood Glucose Monitoring and Insulin/Medications Dosing Guide"  in media tab for additional information. EvOrlena Sheldonarticipated in the discussions, expressed understanding, and voiced agreement with the above plans.  All questions were answered to her satisfaction. she is encouraged to contact clinic should she have any questions or concerns prior to her return visit.   Follow up plan: - Return in about 3 months (around 06/13/2018) for Follow up with Pre-visit Labs, Meter, and Logs.  GeGlade LloydMD Phone: [phone]: [phone]-  This note was partially dictated with voice recognition software. Similar sounding words can be transcribed inadequately or may not  be corrected upon review.  03/13/2018, 2:04 PM

## 2018-03-18 ENCOUNTER — Other Ambulatory Visit: Payer: Self-pay | Admitting: "Endocrinology

## 2018-05-26 ENCOUNTER — Other Ambulatory Visit: Payer: Self-pay | Admitting: Gastroenterology

## 2018-05-26 ENCOUNTER — Other Ambulatory Visit: Payer: Self-pay | Admitting: Nurse Practitioner

## 2018-05-26 DIAGNOSIS — R1013 Epigastric pain: Secondary | ICD-10-CM

## 2018-05-26 DIAGNOSIS — K219 Gastro-esophageal reflux disease without esophagitis: Secondary | ICD-10-CM

## 2018-05-26 DIAGNOSIS — R110 Nausea: Secondary | ICD-10-CM

## 2018-05-30 ENCOUNTER — Telehealth: Payer: Self-pay | Admitting: Internal Medicine

## 2018-05-30 NOTE — Telephone Encounter (Signed)
RX Dexilant and Zofran were sent into pts pharmacy. Pt is aware.

## 2018-05-30 NOTE — Telephone Encounter (Signed)
Pt called to say that Carrie Turner had faxed [place] a refill request and they haven't heard from [place]. Patient doesn't know the name of the medication and she is out. [phone]

## 2018-05-30 NOTE — Telephone Encounter (Signed)
RMR disregard this phone note. I sent it in error. It should've went to AM.

## 2018-05-30 NOTE — Telephone Encounter (Signed)
Pt called to say that [facility] had sent [place] a refill request and they haven't heard back from [place]. Patient doesn't know the name of the medication, but she is out. [phone]

## 2018-05-31 NOTE — Telephone Encounter (Signed)
ok 

## 2018-06-18 LAB — HEMOGLOBIN A1C: HEMOGLOBIN A1C: 11

## 2018-06-18 LAB — BASIC METABOLIC PANEL
BUN: 8 (ref 4–21)
CREATININE: 0.5 (ref 0.5–1.1)

## 2018-06-19 ENCOUNTER — Ambulatory Visit: Payer: Medicare Other | Admitting: "Endocrinology

## 2018-07-02 ENCOUNTER — Encounter: Payer: Medicare Other | Attending: Internal Medicine | Admitting: Nutrition

## 2018-07-02 ENCOUNTER — Ambulatory Visit (INDEPENDENT_AMBULATORY_CARE_PROVIDER_SITE_OTHER): Payer: Medicare Other | Admitting: "Endocrinology

## 2018-07-02 ENCOUNTER — Encounter: Payer: Self-pay | Admitting: Nutrition

## 2018-07-02 ENCOUNTER — Encounter: Payer: Self-pay | Admitting: "Endocrinology

## 2018-07-02 VITALS — Ht 62.0 in | Wt 195.0 lb

## 2018-07-02 VITALS — BP 143/85 | HR 103 | Ht 62.0 in | Wt 195.0 lb

## 2018-07-02 DIAGNOSIS — E782 Mixed hyperlipidemia: Secondary | ICD-10-CM | POA: Diagnosis not present

## 2018-07-02 DIAGNOSIS — I10 Essential (primary) hypertension: Secondary | ICD-10-CM

## 2018-07-02 DIAGNOSIS — E1165 Type 2 diabetes mellitus with hyperglycemia: Secondary | ICD-10-CM

## 2018-07-02 DIAGNOSIS — Z9119 Patient's noncompliance with other medical treatment and regimen: Secondary | ICD-10-CM | POA: Diagnosis not present

## 2018-07-02 DIAGNOSIS — IMO0002 Reserved for concepts with insufficient information to code with codable children: Secondary | ICD-10-CM

## 2018-07-02 DIAGNOSIS — Z794 Long term (current) use of insulin: Secondary | ICD-10-CM

## 2018-07-02 DIAGNOSIS — E118 Type 2 diabetes mellitus with unspecified complications: Secondary | ICD-10-CM

## 2018-07-02 DIAGNOSIS — Z91199 Patient's noncompliance with other medical treatment and regimen due to unspecified reason: Secondary | ICD-10-CM

## 2018-07-02 NOTE — Progress Notes (Signed)
Endocrinology follow-up note  Subjective:    Patient ID: Carrie Turner, female    DOB: 04-Nov-1950. Patient is being seen in follow-up for management of diabetes requested by  Monico Blitz, MD  Past Medical History:  Diagnosis Date  . Anxiety   . COPD (chronic obstructive pulmonary disease) ([place])   . Depression   . Diabetes mellitus, type II ([place])   . GERD (gastroesophageal reflux disease)   . Glaucoma   . Hyperlipidemia   . Hypertension   . Migraines   . Neuropathy   . Seizures ([place])    seizure was from ETOH, "a long time ago", no med and no recurrance  . Vitamin D deficiency    Past Surgical History:  Procedure Laterality Date  . ABDOMINAL HYSTERECTOMY    . COLONOSCOPY  11/2015   Dr. Britta Mccreedy: sessile polyp removed (benign). advised repeat colonoscopy in 5 years.   . COLONOSCOPY WITH PROPOFOL N/A 10/26/2017   Procedure: COLONOSCOPY WITH PROPOFOL;  Surgeon: Daneil Dolin, MD;  [facility];  Service: Endoscopy;  Laterality: N/A;  12:45pm  . ESOPHAGEAL DILATION  10/26/2017   Procedure: ESOPHAGEAL DILATION;  Surgeon: Daneil Dolin, MD;  Location: [facility];  Service: Endoscopy;;  . ESOPHAGOGASTRODUODENOSCOPY  11/2015   Dr. Britta Mccreedy: hiatal hernia  . ESOPHAGOGASTRODUODENOSCOPY (EGD) WITH PROPOFOL N/A 10/26/2017   Procedure: ESOPHAGOGASTRODUODENOSCOPY (EGD) WITH PROPOFOL;  Surgeon: Daneil Dolin, MD;  Location: [facility];  Service: Endoscopy;  Laterality: N/A;  . KNEE ARTHROSCOPY Right   . ORIF ANKLE FRACTURE Right    Social History   Socioeconomic History  . Marital status: Single    Spouse name: Not on file  . Number of children: Not on file  . Years of education: Not on file  . Highest education level: Not on file  Occupational History  . Not on file  Social Needs  . Financial resource strain: Not on file  . Food insecurity:    Worry: Not on file    Inability: Not on file  . Transportation needs:    Medical: Not on file    Non-medical: Not on  file  Tobacco Use  . Smoking status: Never Smoker  . Smokeless tobacco: Never Used  Substance and Sexual Activity  . Alcohol use: No  . Drug use: No  . Sexual activity: Never  Lifestyle  . Physical activity:    Days per week: Not on file    Minutes per session: Not on file  . Stress: Not on file  Relationships  . Social connections:    Talks on phone: Not on file    Gets together: Not on file    Attends religious service: Not on file    Active member of club or organization: Not on file    Attends meetings of clubs or organizations: Not on file    Relationship status: Not on file  Other Topics Concern  . Not on file  Social History Narrative  . Not on file   Outpatient Encounter Medications as of 07/02/2018  Medication Sig  . amLODipine (NORVASC) 2.5 MG tablet Take 2.5 mg by mouth daily.  . Blood Glucose Monitoring Suppl (ONETOUCH VERIO) w/Device KIT 1 each by Does not apply route 4 (four) times daily. E11.65  . Cholecalciferol (VITAMIN D3) 2000 units TABS Take 2,000 Units by mouth daily.  . citalopram (CELEXA) 40 MG tablet Take 40 mg by mouth daily.  . Continuous Blood Gluc Sensor (FREESTYLE LIBRE 14 DAY SENSOR)  MISC Inject 1 each into the skin every 14 (fourteen) days. Use as directed.  Marland Kitchen DEXILANT 60 MG capsule TAKE 1 CAPSULE BY MOUTH ONCE DAILY  . docusate sodium (COLACE) 100 MG capsule Take 100 mg by mouth 2 (two) times daily as needed for mild constipation.  . Ferrous Gluconate (IRON 27 PO) Take 1 tablet by mouth daily.  Marland Kitchen gabapentin (NEURONTIN) 800 MG tablet Take 800 mg by mouth 3 (three) times daily.  . insulin lispro (HUMALOG KWIKPEN) 100 UNIT/ML KiwkPen Inject 0.18-0.24 mLs (18-24 Units total) into the skin 3 (three) times daily.  Marland Kitchen latanoprost (XALATAN) 0.005 % ophthalmic solution Place 1 drop into both eyes at bedtime.  Marland Kitchen LEVEMIR FLEXTOUCH 100 UNIT/ML Pen INJECT 80 UNITS SUBCUTANEOUSLY AT BEDTIME  . LINZESS 72 MCG capsule Take 72 mcg by mouth daily.  Marland Kitchen  lisinopril-hydrochlorothiazide (PRINZIDE,ZESTORETIC) 20-12.5 MG tablet Take 1 tablet by mouth 2 (two) times daily.  . ondansetron (ZOFRAN) 4 MG tablet TAKE 1 TABLET BY MOUTH EVERY 6 HOURS AS NEEDED FOR NAUSEA AND VOMITING  . ONETOUCH VERIO test strip USE 1 STRIP TO CHECK GLUCOSE 4 TIMES DAILY  . pantoprazole (PROTONIX) 40 MG tablet Take 40 mg by mouth at bedtime.   . pravastatin (PRAVACHOL) 40 MG tablet Take 40 mg by mouth daily.  Marland Kitchen pyridOXINE (VITAMIN B-6) 100 MG tablet Take 100 mg by mouth daily.  . ranitidine (ZANTAC) 150 MG capsule Take 150 mg by mouth every evening.  . sucralfate (CARAFATE) 1 g tablet Take 1 tablet (1 g total) by mouth 3 (three) times daily with meals. Crush tablet in 5cc of water prior to taking. (Patient taking differently: Take 1 g by mouth as needed. Crush tablet in 5cc of water prior to taking.)  . sucralfate (CARAFATE) 1 g tablet Take 1 tablet (1 g total) by mouth 4 (four) times daily -  with meals and at bedtime. (Patient taking differently: Take 1 g by mouth as needed. )  . Suvorexant (BELSOMRA) 20 MG TABS Take 20 mg by mouth at bedtime.   . vitamin B-12 (CYANOCOBALAMIN) 500 MCG tablet Take 500 mcg by mouth daily.   No facility-administered encounter medications on file as of 07/02/2018.    ALLERGIES: Allergies  Allergen Reactions  . Sulfa Antibiotics    VACCINATION STATUS:  There is no immunization history on file for this patient.  Diabetes  She presents for her follow-up diabetic visit. She has type 2 diabetes mellitus. Onset time: She was diagnosed at approximate age of 58 years. Her disease course has been worsening. There are no hypoglycemic associated symptoms. Pertinent negatives for hypoglycemia include no confusion, headaches, pallor or seizures. Associated symptoms include fatigue, polydipsia and polyuria. Pertinent negatives for diabetes include no blurred vision, no chest pain and no polyphagia. There are no hypoglycemic complications. Symptoms are  worsening. There are no diabetic complications. Risk factors for coronary artery disease include diabetes mellitus, dyslipidemia, obesity and sedentary lifestyle. Current diabetic treatment includes insulin injections (She did not freeze her insulin dose as recommended during her last visit.). She is compliant with treatment some of the time. Her weight is fluctuating minimally. She is following a generally unhealthy diet. When asked about meal planning, she reported none. She has had a previous visit with a dietitian (She will see the dietitian today.). She never participates in exercise. Her home blood glucose trend is decreasing steadily. (She comes in with no meter nor logs to review.  Her previsit labs show A1c of 11% unchanged from her  last visit A1c of 10.7%.   ) An ACE inhibitor/angiotensin II receptor blocker is not being taken. Eye exam is current.  Hyperlipidemia  This is a chronic problem. The current episode started more than 1 year ago. Recent lipid tests were reviewed and are high. Exacerbating diseases include diabetes and obesity. Pertinent negatives include no chest pain, myalgias or shortness of breath. She is currently on no antihyperlipidemic treatment. Compliance problems include adherence to diet and adherence to exercise.  Risk factors for coronary artery disease include diabetes mellitus, dyslipidemia, hypertension, obesity and a sedentary lifestyle.  Hypertension  This is a chronic problem. The current episode started more than 1 year ago. The problem is controlled. Pertinent negatives include no blurred vision, chest pain, headaches, palpitations or shortness of breath. Risk factors for coronary artery disease include diabetes mellitus, dyslipidemia and sedentary lifestyle. Past treatments include calcium channel blockers. Compliance problems include diet and exercise.     Review of Systems  Constitutional: Positive for fatigue. Negative for unexpected weight change.  HENT:  Negative for trouble swallowing and voice change.   Eyes: Negative for blurred vision and visual disturbance.  Respiratory: Negative for cough, shortness of breath and wheezing.   Cardiovascular: Negative for chest pain, palpitations and leg swelling.  Gastrointestinal: Negative for diarrhea, nausea and vomiting.  Endocrine: Positive for polydipsia and polyuria. Negative for cold intolerance, heat intolerance and polyphagia.  Genitourinary: Positive for frequency. Negative for dysuria and flank pain.  Musculoskeletal: Negative for arthralgias and myalgias.  Skin: Negative for color change, pallor, rash and wound.  Neurological: Negative for seizures and headaches.  Psychiatric/Behavioral: Negative for confusion and suicidal ideas.    Objective:    BP (!) 143/85   Pulse (!) 103   Ht _0  (1.575 m)   Wt 195 lb (88.5 kg)   BMI 35.67 kg/m   Wt Readings from Last 3 Encounters:  07/02/18 195 lb (88.5 kg)  07/02/18 195 lb (88.5 kg)  03/13/18 207 lb (93.9 kg)    Physical Exam  Constitutional: She is oriented to person, place, and time. She appears well-developed.  HENT:  Head: Normocephalic and atraumatic.  Eyes: EOM are normal.  Neck: Normal range of motion. Neck supple. No tracheal deviation present. No thyromegaly present.  Cardiovascular: Normal rate.  Pulmonary/Chest: Effort normal.  Abdominal: There is no tenderness. There is no guarding.  Musculoskeletal: Normal range of motion. She exhibits no edema.  Neurological: She is alert and oriented to person, place, and time. No cranial nerve deficit. Coordination normal.  Skin: Skin is warm and dry. No rash noted. No erythema. No pallor.  Psychiatric: She has a normal mood and affect. Judgment normal.  Reluctant affect.   Labs: Labs from June 18, 2018 showed BUN 8 creatinine 051, A1c 11% Labs from January 24, 2018 showed BUN 9,  Creatinine 0.58, A1c 10.7% September 20, 2017 A1c 10.5% 05/22/2017 show A1c of 9.5% 02/22/2017 show  A1c of 9.5% 08/08/2016 show A1c of 9.6%. 04/20/2016: Her A1c was 11.4% A1c was 10.3% on 01/12/2016,   Assessment & Plan:   1. Uncontrolled type 2 diabetes mellitus with complication, with long-term current use of insulin ([place])  - Patient has currently uncontrolled symptomatic type 2 DM since  67 years of age. -This patient presents with no logs nor meter to review.  Her A1c is 11% unchanged from her last visit A1c.     -Recent labs are reviewed, showing normal renal function.   Her diabetes is complicated by noncompliance/nonadherence, obesity  and sedentary life and patient remains at a high risk for more acute and chronic complications of diabetes which include CAD, CVA, CKD, retinopathy, and neuropathy. These are all discussed in detail with the patient.  - I have counseled the patient on diet management and weight loss, by adopting a carbohydrate restricted/protein rich diet.  -She admits to significant dietary indiscretion.  This patient remains alarmingly noncompliant.  -  Suggestion is made for her to avoid simple carbohydrates  from her diet including Cakes, Sweet Desserts / Pastries, Ice Cream, Soda (diet and regular), Sweet Tea, Candies, Chips, Cookies, Store Bought Juices, Alcohol in Excess of  1-2 drinks a day, Artificial Sweeteners, and "Sugar-free" Products. This will help patient to have stable blood glucose profile and potentially avoid unintended weight gain.   - I encouraged the patient to switch to  unprocessed or minimally processed complex starch and increased protein intake (animal or plant source), fruits, and vegetables.  - Patient is advised to stick to a routine mealtimes to eat 3 meals  a day and avoid unnecessary snacks ( to snack only to correct hypoglycemia).   - I have approached patient with the following individualized plan to manage diabetes and patient agrees:   -she is struggling to achieve control of diabetes, likely deals with moderate cognitive  deficit.    -I discussed with her the need for continued requirement for intensive treatment with basal/bolus insulin in order for her to achieve control of diabetes.   -The fact that she presents with no meter nor logs, it is difficult to give her optimal titration on her insulin doses.   -She is advised to resume and continue her Levemir 80 units nightly,  Humalog  18 units  3 times daily before meals for pre-meal BG readings of 90 -181m/dl, plus patient specific correction dose for unexpected hyperglycemia above 1576mdl, associated with strict monitoring of glucose 4 times a day-before meals and at bedtime. - Patient is warned not to take insulin without proper monitoring per orders.  -Patient is encouraged to call clinic for blood glucose levels less than 70 or above 300 mg /dl. -She did not tolerate even low-dose metformin.   -She is not a reliable candidate for incretin therapy.    - Patient specific target  A1c;  LDL, HDL, Triglycerides, and  Waist Circumference were discussed in detail.  2) BP/HTN: Her blood pressure is not controlled to target.     She is currently on lisinopril-hydrochlorothiazide 20-12.5 mg p.o. daily, amlodipine 2.5 mg p.o. daily.   3) Lipids/HPL:  Control unknown, I will obtain fasting lipid panel on subsequent visits.  She is advised to continue pravastatin 40 mg p.o. nightly.   4)  obesity: CDE Consult in progress , exercise, and detailed carbohydrates information provided.  5) Chronic Care/Health Maintenance:  -Patient is encouraged to continue to follow up with Ophthalmology, Podiatrist at least yearly or according to recommendations, and advised to   stay away from smoking. I have recommended yearly flu vaccine and pneumonia vaccination at least every 5 years; moderate intensity exercise for up to 150 minutes weekly; and  sleep for at least 7 hours a day.  - I advised patient to maintain close follow up with ShMonico BlitzMD for primary care needs.  -  Time spent with the patient: 25 min, of which >50% was spent in reviewing her blood glucose logs , discussing her hypo- and hyper-glycemic episodes, reviewing her current and  previous labs and insulin doses and developing  a plan to avoid hypo- and hyper-glycemia. Please refer to Patient Instructions for Blood Glucose Monitoring and Insulin/Medications Dosing Guide"  in media tab for additional information. Orlena Sheldon participated in the discussions, expressed understanding, and voiced agreement with the above plans.  All questions were answered to her satisfaction. she is encouraged to contact clinic should she have any questions or concerns prior to her return visit.    Follow up plan: - Return in about 3 months (around 10/01/2018) for Follow up with Pre-visit Labs, Meter, and Logs.  Glade Lloyd, MD Phone: [phone]  Fax: [phone]  -  This note was partially dictated with voice recognition software. Similar sounding words can be transcribed inadequately or may not  be corrected upon review.  07/02/2018, 2:16 PM

## 2018-07-02 NOTE — Progress Notes (Signed)
Diabetes Self-Management Education  Visit Type:    Appt. Start Time: 1515 Appt. End Time: 4008  07/02/2018  [name], identified by name and date of birth, is a 67 y.o. female with a diagnosis of Diabetes:   Lost 12 lbs. This may due to uncontrolled blood sugars. Complains of her mouth being really dry when she wakes up. Doesn't like to drink water. Drinking sodas. She complains of diarrhea but usually has constipation. Saw Dr. Dorris Fetch today.  80 units a day, 18 units plus sliding scale with meals. Skipping meals some due to not feeling well from diarrhea. Didn't bring log sheets. Her grandson tore them up. Needs better engagement with eating and exercising and accuracy of insulin.  Lab Results  Component Value Date   HGBA1C 10.7 01/24/2018      ASSESSMENT  Height 5\' 2"  (1.575 m), weight 195 lb (88.5 kg). Body mass index is 35.67 kg/m.    B) skipped due to diarrhea L) Vegetable beef soup 6, Soda D) skipped due to not feeling good.   Diabetes Self-Management Education - 10/18/17 1421      Visit Information   Visit Type  Follow-up      Health Coping   How would you rate your overall health?  Fair      Pre-Education Assessment   Patient understands the diabetes disease and treatment process.  Needs Review    Patient understands incorporating nutritional management into lifestyle.  Needs Review    Patient undertands incorporating physical activity into lifestyle.  Needs Review    Patient understands using medications safely.  Needs Review    Patient understands monitoring blood glucose, interpreting and using results  Needs Review    Patient understands prevention, detection, and treatment of acute complications.  Needs Review    Patient understands prevention, detection, and treatment of chronic complications.  Needs Review    Patient understands how to develop strategies to address psychosocial issues.  Needs Review    Patient understands how to develop strategies  to promote health/change behavior.  Needs Review      Complications   Last HgB A1C per patient/outside source  10.7   How often do you check your blood sugar?  3-4 times/day    Fasting Blood glucose range (mg/dL)  180-200    Postprandial Blood glucose range (mg/dL)  180-200    Number of hypoglycemic episodes per month  0    Number of hyperglycemic episodes per week  15    Can you tell when your blood sugar is high?  No    Have you had a dilated eye exam in the past 12 months?  Yes    Have you had a dental exam in the past 12 months?  No    Are you checking your feet?  Yes    How many days per week are you checking your feet?  7      Dietary Intake   Breakfast  Oatmeal, boiled egg, coffe    Lunch  Grilled Chicken, vegetables, water    Dinner  TV Healthy CHoice TV dinner and 1 slice bread    Beverage(s)  water      Exercise   Exercise Type  ADL's      Patient Education   Nutrition management   Role of diet in the treatment of diabetes and the relationship between the three main macronutrients and blood glucose level;Carbohydrate counting;Meal timing in regards to the patients' current diabetes medication.;Reviewed blood glucose goals for  pre and post meals and how to evaluate the patients' food intake on their blood glucose level.    Physical activity and exercise   Role of exercise on diabetes management, blood pressure control and cardiac health.    Monitoring  Purpose and frequency of SMBG.    Chronic complications  Relationship between chronic complications and blood glucose control;Dental care;Lipid levels, blood glucose control and heart disease      Individualized Goals (developed by patient)   Physical Activity  Exercise 3-5 times per week;15 minutes per day    Medications  take my medication as prescribed    Monitoring   test my blood glucose as discussed    Reducing Risk  examine blood glucose patterns;do foot checks daily      Patient Self-Evaluation of Goals - Patient  rates self as meeting previously set goals (% of time)   Nutrition  50 - 75 %    Physical Activity  25 - 50%    Medications  >75%    Monitoring  >75%    Problem Solving  25 - 50%    Reducing Risk  25 - 50%    Health Coping  25 - 50%      Post-Education Assessment   Patient understands incorporating nutritional management into lifestyle.  Needs Review    Patient undertands incorporating physical activity into lifestyle.  Needs Review      Outcomes   Expected Outcomes  Demonstrated interest in learning. Expect positive outcomes    Future DMSE  3-4 months    Program Status  Completed      Subsequent Visit   Since your last visit have you continued or begun to take your medications as prescribed?  Yes    Since your last visit have you had your blood pressure checked?  Yes    Is your most recent blood pressure lower, unchanged, or higher since your last visit?  Unchanged    Since your last visit have you experienced any weight changes?  Gain    Weight Gain (lbs)  2    Since your last visit, are you checking your blood glucose at least once a day?  Yes       Individualized Plan for Diabetes Self-Management Training:   Learning Objective:  Patient will have a greater understanding of diabetes self-management. Patient education plan is to attend individual and/or group sessions per assessed needs and concerns. Goals 1. Eat 2-3 carb choices per meal. 2. Walk 15-30 minutes a day or 2500 steps. 3. Keep drinking water 4. Avoid snacks between meals  Expected Outcomes:    Improved DM and reduced complications.  Education material provided: My Plate  If problems or questions, patient to contact team via:  Phone and Email  Future DSME appointment:   3 months.

## 2018-07-02 NOTE — Patient Instructions (Signed)

## 2018-07-02 NOTE — Patient Instructions (Addendum)
Goals 1. Drink 3 bottles of water per day  2. Don't skip meals Avoid snacks between meals. Take insulin as prescribed.

## 2018-08-08 ENCOUNTER — Other Ambulatory Visit: Payer: Self-pay | Admitting: "Endocrinology

## 2018-08-14 ENCOUNTER — Other Ambulatory Visit: Payer: Self-pay

## 2018-08-14 DIAGNOSIS — R7989 Other specified abnormal findings of blood chemistry: Secondary | ICD-10-CM

## 2018-08-14 DIAGNOSIS — R945 Abnormal results of liver function studies: Principal | ICD-10-CM

## 2018-08-29 ENCOUNTER — Encounter: Payer: Self-pay | Admitting: Nurse Practitioner

## 2018-08-29 ENCOUNTER — Ambulatory Visit (INDEPENDENT_AMBULATORY_CARE_PROVIDER_SITE_OTHER): Payer: Medicare Other | Admitting: Nurse Practitioner

## 2018-08-29 VITALS — BP 134/84 | HR 98 | Temp 97.5°F | Ht 62.0 in | Wt 191.2 lb

## 2018-08-29 DIAGNOSIS — K5900 Constipation, unspecified: Secondary | ICD-10-CM | POA: Insufficient documentation

## 2018-08-29 DIAGNOSIS — R1013 Epigastric pain: Secondary | ICD-10-CM | POA: Diagnosis not present

## 2018-08-29 DIAGNOSIS — R110 Nausea: Secondary | ICD-10-CM

## 2018-08-29 DIAGNOSIS — K219 Gastro-esophageal reflux disease without esophagitis: Secondary | ICD-10-CM | POA: Diagnosis not present

## 2018-08-29 MED ORDER — ONDANSETRON HCL 4 MG PO TABS: ORAL_TABLET | ORAL | 2 refills | Status: DC

## 2018-08-29 NOTE — Progress Notes (Signed)
cc'ed to pcp °

## 2018-08-29 NOTE — Assessment & Plan Note (Signed)
The patient has rare/intermittent nausea without vomiting in the setting of uncontrolled diabetes and GERD.  Her GERD is currently very well managed.  She has made significant lifestyle changes related to her diabetes and has lost 15 pounds intentionally.  She is hoping her A1c will be down.  Zofran does help her nausea.  Recommend she continue lifestyle changes working with a diabetic nutritionist.  Use Zofran as needed.  I will refill this for her today.  Follow-up in 6 months.

## 2018-08-29 NOTE — Assessment & Plan Note (Signed)
The patient has constipation.  She is on Linzess 72 mcg daily which works well.  Occasionally she will have a day where Linzess is just too much and causes fecal incontinence and "liquid stool running down my leg."  At that point she will typically stop taking Linzess for a while which resulted in constipation and some worsening nausea.  I have advised her to try taking Linzess every other day or, alternatively, she can open the capsule and dump half of the medication into a glass of water and discard the other half giving an effective "half dose."  She is to call us with a progress report in 3 to 4 weeks.  If she is still having loose stool incontinence we can consider changing to another medication such as Amitiza.  Follow-up in 6 months regardless.

## 2018-08-29 NOTE — Assessment & Plan Note (Signed)
GERD symptoms are significantly improved on Dexilant.  Recommend she continue Dexilant for now.  Nausea possibly mildly contributed to by GERD although it is more likely a result of her previously uncontrolled diabetes.  She is getting a handle on this, as per below.  Return for follow-up in 6 months.

## 2018-08-29 NOTE — Progress Notes (Signed)
Referring Provider: Monico Blitz, MD Primary Care Physician:  Monico Blitz, MD Primary GI:  Dr. Gala Romney  Chief Complaint  Patient presents with  . Gastroesophageal Reflux    c/o lots of nausea but no vomiting    HPI:   Carrie Turner is a 68 y.o. female who presents for follow-up on GERD, nausea, vomiting.  The patient was last seen in our office 02/21/2018 for the same.  Colonoscopy up-to-date 2017 by Dr. Britta Mccreedy with recommended 5-year repeat in 2020.  EGD at same time found hiatal hernia but otherwise unremarkable.  Her colonoscopy was updated on 10/26/2017 which found a single polyp that was tubular adenoma and recommended 5-year repeat in 2024.  Random colon biopsies were normal.  At her last visit she felt Dexilant was working better.  Nausea and vomiting felt to be due to uncontrolled diabetes with a recent hemoglobin A1c of 11.  Symptoms about every 3 days but an antiemetic helps.  Scheduled to see a diabetes nutritionist.  Self-limiting, rare abdominal pain.  Alternating constipation and diarrhea with associated abdominal pain which is improved on Linzess (Rx given by PCP).  No other GI symptoms.  Recommended continue Dexilant, have labs completed related to previously elevated alkaline phosphatase.  Follow-up with primary care and diabetic nutritionist, antiemetics as needed, follow-up in 6 months.  Follow-up labs completed 02/21/2018 which found elevated alkaline phosphatase at 157 will GGT at 40.  Recommended follow-up with Dr. Dorris Fetch at next office visit.  Given history of fatty liver disease recommended recheck LFTs in 6 months.  Today she states she's doing ok overall. Linzess is causing intermittent loose stools and incontinence. She has been skipping doses and yesterday had significant nausea and felt it was due to constipation; she took 2 Linzess to help. She uses Zofran for nausea which helps a lot but needs a refill. [place] working much better for her. Feels Linzess works better  if she takes it at night. GERD well managed. Denies hematochezia, melena, fever, chills. Has lost about 15 lbs intentionally working with diabetes nutritionist and lifestyle changes.  Past Medical History:  Diagnosis Date  . Anxiety   . COPD (chronic obstructive pulmonary disease) ([place])   . Depression   . Diabetes mellitus, type II ([place])   . GERD (gastroesophageal reflux disease)   . Glaucoma   . Hyperlipidemia   . Hypertension   . Migraines   . Neuropathy   . 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 Vitamin D deficiency     Past Surgical History:  Procedure Laterality Date  . ABDOMINAL HYSTERECTOMY    . COLONOSCOPY  11/2015   Dr. Britta Mccreedy: sessile polyp removed (benign). advised repeat colonoscopy in 5 years.   . COLONOSCOPY WITH PROPOFOL N/A 10/26/2017   Procedure: COLONOSCOPY WITH PROPOFOL;  Surgeon: Daneil Dolin, MD;  [facility];  Service: Endoscopy;  Laterality: N/A;  12:45pm  . ESOPHAGEAL DILATION  10/26/2017   Procedure: ESOPHAGEAL DILATION;  Surgeon: Daneil Dolin, MD;  Location: [facility];  Service: Endoscopy;;  . ESOPHAGOGASTRODUODENOSCOPY  11/2015   Dr. Britta Mccreedy: hiatal hernia  . ESOPHAGOGASTRODUODENOSCOPY (EGD) WITH PROPOFOL N/A 10/26/2017   Procedure: ESOPHAGOGASTRODUODENOSCOPY (EGD) WITH PROPOFOL;  Surgeon: Daneil Dolin, MD;  [facility];  Service: Endoscopy;  Laterality: N/A;  . KNEE ARTHROSCOPY Right   . ORIF ANKLE FRACTURE Right     Current Outpatient Medications  Medication  Sig Dispense Refill  . amLODipine (NORVASC) 2.5 MG tablet Take 2.5 mg by mouth daily.    . Blood Glucose Monitoring Suppl (ONETOUCH VERIO) w/Device KIT 1 each by Does not apply route 4 (four) times daily. E11.65 1 kit 0  . Cholecalciferol (VITAMIN D3) 2000 units TABS Take 2,000 Units by mouth daily.    . citalopram (CELEXA) 40 MG tablet Take 40 mg by mouth daily.    . Continuous Blood Gluc Sensor (FREESTYLE LIBRE  14 DAY SENSOR) MISC Inject 1 each into the skin every 14 (fourteen) days. Use as directed. 2 each 2  . DEXILANT 60 MG capsule TAKE 1 CAPSULE BY MOUTH ONCE DAILY 90 capsule 1  . docusate sodium (COLACE) 100 MG capsule Take 100 mg by mouth 2 (two) times daily as needed for mild constipation.    . Ferrous Gluconate (IRON 27 PO) Take 1 tablet by mouth daily.    Marland Kitchen gabapentin (NEURONTIN) 800 MG tablet Take 800 mg by mouth 3 (three) times daily.    . insulin lispro (HUMALOG KWIKPEN) 100 UNIT/ML KiwkPen Inject 0.18-0.24 mLs (18-24 Units total) into the skin 3 (three) times daily. 10 pen 2  . latanoprost (XALATAN) 0.005 % ophthalmic solution Place 1 drop into both eyes at bedtime.    Marland Kitchen LEVEMIR FLEXTOUCH 100 UNIT/ML Pen INJECT 80 UNITS SUBCUTANEOUSLY AT BEDTIME 60 mL 2  . LINZESS 72 MCG capsule Take 72 mcg by mouth as needed.   3  . lisinopril-hydrochlorothiazide (PRINZIDE,ZESTORETIC) 20-12.5 MG tablet Take 1 tablet by mouth 2 (two) times daily.    . ondansetron (ZOFRAN) 4 MG tablet TAKE 1 TABLET BY MOUTH EVERY 6 HOURS AS NEEDED FOR NAUSEA AND VOMITING 30 tablet 2  . ONETOUCH VERIO test strip USE 1 STRIP TO CHECK GLUCOSE 4 TIMES DAILY 150 each 5  . pravastatin (PRAVACHOL) 40 MG tablet Take 40 mg by mouth daily.    Marland Kitchen pyridOXINE (VITAMIN B-6) 100 MG tablet Take 100 mg by mouth daily.    . sucralfate (CARAFATE) 1 g tablet Take 1 tablet (1 g total) by mouth 3 (three) times daily with meals. Crush tablet in 5cc of water prior to taking. (Patient taking differently: Take 1 g by mouth as needed. Crush tablet in 5cc of water prior to taking.) 42 tablet 0  . sucralfate (CARAFATE) 1 g tablet Take 1 tablet (1 g total) by mouth 4 (four) times daily -  with meals and at bedtime. (Patient taking differently: Take 1 g by mouth as needed. ) 90 tablet 2  . Suvorexant (BELSOMRA) 20 MG TABS Take 20 mg by mouth at bedtime.     . vitamin B-12 (CYANOCOBALAMIN) 500 MCG tablet Take 500 mcg by mouth daily.     No current  facility-administered medications for this visit.     Allergies as of 08/29/2018 - Review Complete 08/29/2018  Allergen Reaction Noted  . Sulfa antibiotics  03/04/2016    Family History  Problem Relation Age of Onset  . Hypertension Mother   . Colon cancer Mother        diagnosed early 91s  . Cerebral aneurysm Mother   . Alcohol abuse Father   . Pancreatic cancer Sister        deceased in 69s  . Colon cancer Sister        diagnosed 44 y/o  . Stroke Brother   . Cancer Maternal Grandmother   . Cancer Maternal Grandfather   . Cancer Paternal Grandmother   . Other Paternal Grandfather   .  Other Brother     Social History   Socioeconomic History  . Marital status: Single    Spouse name: Not on file  . Number of children: Not on file  . Years of education: Not on file  . Highest education level: Not on file  Occupational History  . Not on file  Social Needs  . Financial resource strain: Not on file  . Food insecurity:    Worry: Not on file    Inability: Not on file  . Transportation needs:    Medical: Not on file    Non-medical: Not on file  Tobacco Use  . Smoking status: Never Smoker  . Smokeless tobacco: Never Used  Substance and Sexual Activity  . Alcohol use: No  . Drug use: No  . Sexual activity: Never  Lifestyle  . Physical activity:    Days per week: Not on file    Minutes per session: Not on file  . Stress: Not on file  Relationships  . Social connections:    Talks on phone: Not on file    Gets together: Not on file    Attends religious service: Not on file    Active member of club or organization: Not on file    Attends meetings of clubs or organizations: Not on file    Relationship status: Not on file  Other Topics Concern  . Not on file  Social History Narrative  . Not on file    Review of Systems: General: Negative for anorexia, weight loss, fever, chills, fatigue, weakness. ENT: Negative for hoarseness, difficulty swallowing. CV:  Negative for chest pain, angina, palpitations, peripheral edema.  Respiratory: Negative for dyspnea at rest, cough, sputum, wheezing.  GI: See history of present illness. Endo: Negative for unusual weight change.  Heme: Negative for bruising or bleeding. Allergy: Negative for rash or hives.   Physical Exam: BP 134/84   Pulse 98   Temp (!) 97.5 F (36.4 C) (Oral)   Ht _0  (1.575 m)   Wt 191 lb 3.2 oz (86.7 kg)   BMI 34.97 kg/m  General:   Alert and oriented. Pleasant and cooperative. Well-nourished and well-developed.  Eyes:  Without icterus, sclera clear and conjunctiva pink.  Ears:  Normal auditory acuity. Cardiovascular:  S1, S2 present without murmurs appreciated. Extremities without clubbing or edema. Respiratory:  Clear to auscultation bilaterally. No wheezes, rales, or rhonchi. No distress.  Gastrointestinal:  +BS, soft, non-tender and non-distended. No HSM noted. No guarding or rebound. No masses appreciated.  Rectal:  Deferred  Musculoskalatal:  Symmetrical without gross deformities. Neurologic:  Alert and oriented x4;  grossly normal neurologically. Psych:  Alert and cooperative. Normal mood and affect. Heme/Lymph/Immune: No excessive bruising noted.    08/29/2018 1:44 PM   Disclaimer: This note was dictated with voice recognition software. Similar sounding words can inadvertently be transcribed and may not be corrected upon review.

## 2018-08-29 NOTE — Patient Instructions (Signed)
CONGRATS on your excellent weight loss!!!  Your health issues we discussed today were:   Nausea: 1. I have sent a refill of Zofran to your pharmacy. 2. Use Zofran as needed for nausea  Constipation: 1. Try reducing the amount of Linzess you take.  You can do this 1 of 2 ways: Take a Linzess 72 mcg pill every other day or, you can open the capsule and dump half the medication into a glass of water and discard the other half; you can do this every day 2. Call us in 3 to 4 weeks and let us know if your diarrhea and incontinence is improving with 1 of these options 3. If no improvement with these then we can consider a different medication for you  Heartburn (GERD): 1. Continue taking Dexilant as it seems to be working better for you  Overall I recommend:  1. Follow-up in 6 months. 2. Call us if you have any questions or concerns  At [facility] we value your feedback. You may receive a survey about your visit today. Please share your experience as we strive to create trusting relationships with our patients to provide genuine, compassionate, quality care.  We appreciate your understanding and patience as we review any laboratory studies, imaging, and other diagnostic tests that are ordered as we care for you. Our office policy is 5 business days for review of these results, and any emergent or urgent results are addressed in a timely manner for your best interest. If you do not hear from our office in 1 week, please contact us.   We also encourage the use of MyChart, which contains your medical information for your review as well. If you are not enrolled in this feature, an access code is on this after visit summary for your convenience. Thank you for allowing [place] to be involved in your care.  It was great to see you today!  I hope you have a great day!!

## 2018-09-04 ENCOUNTER — Other Ambulatory Visit: Payer: Self-pay | Admitting: "Endocrinology

## 2018-10-02 ENCOUNTER — Ambulatory Visit: Payer: Medicare Other | Admitting: "Endocrinology

## 2018-10-05 ENCOUNTER — Encounter: Payer: Self-pay | Admitting: "Endocrinology

## 2018-10-05 LAB — BASIC METABOLIC PANEL
BUN: 14 (ref 4–21)
CREATININE: 0.6 (ref 0.5–1.1)

## 2018-10-05 LAB — HEMOGLOBIN A1C: Hemoglobin A1C: 9.3

## 2018-10-16 ENCOUNTER — Telehealth (INDEPENDENT_AMBULATORY_CARE_PROVIDER_SITE_OTHER): Payer: Medicare Other | Admitting: "Endocrinology

## 2018-10-16 DIAGNOSIS — E1165 Type 2 diabetes mellitus with hyperglycemia: Secondary | ICD-10-CM

## 2018-10-16 DIAGNOSIS — E782 Mixed hyperlipidemia: Secondary | ICD-10-CM

## 2018-10-16 DIAGNOSIS — E118 Type 2 diabetes mellitus with unspecified complications: Secondary | ICD-10-CM | POA: Diagnosis not present

## 2018-10-16 DIAGNOSIS — IMO0002 Reserved for concepts with insufficient information to code with codable children: Secondary | ICD-10-CM

## 2018-10-16 DIAGNOSIS — Z794 Long term (current) use of insulin: Secondary | ICD-10-CM | POA: Diagnosis not present

## 2018-10-16 MED ORDER — METFORMIN HCL ER 500 MG PO TB24: 500.0000 mg | ORAL_TABLET | Freq: Every day | ORAL | 3 refills | Status: DC

## 2018-10-16 NOTE — Progress Notes (Signed)
                                                   Endocrinology Telephone Visit Follow up Note -During COVID -19 Pandemic   Subjective:    Patient ID: Carrie Turner, female    DOB: 03/25/1951. Patient is being seen in follow-up for management of diabetes requested by  Shah, Ashish, MD  Past Medical History:  Diagnosis Date  . Anxiety   . COPD (chronic obstructive pulmonary disease) ([facility])   . Depression   . Diabetes mellitus, type II ([facility])   . GERD (gastroesophageal reflux disease)   . Glaucoma   . Hyperlipidemia   . Hypertension   . Migraines   . Neuropathy   . 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 Vitamin D deficiency    Past Surgical History:  Procedure Laterality Date  . ABDOMINAL HYSTERECTOMY    . COLONOSCOPY  11/2015   Dr. Benson: sessile polyp removed (benign). advised repeat colonoscopy in 5 years.   . COLONOSCOPY WITH PROPOFOL N/A 10/26/2017   Procedure: COLONOSCOPY WITH PROPOFOL;  Surgeon: Rourk, Robert M, MD;  [facility];  Service: Endoscopy;  Laterality: N/A;  12:45pm  . ESOPHAGEAL DILATION  10/26/2017   Procedure: ESOPHAGEAL DILATION;  Surgeon: Rourk, Robert M, MD;  [facility];  Service: Endoscopy;;  . ESOPHAGOGASTRODUODENOSCOPY  11/2015   Dr. Benson: hiatal hernia  . ESOPHAGOGASTRODUODENOSCOPY (EGD) WITH PROPOFOL N/A 10/26/2017   Procedure: ESOPHAGOGASTRODUODENOSCOPY (EGD) WITH PROPOFOL;  Surgeon: Rourk, Robert M, MD;  [facility];  Service: Endoscopy;  Laterality: N/A;  . KNEE ARTHROSCOPY Right   . ORIF ANKLE FRACTURE Right    Social History   Socioeconomic History  . Marital status: Single    Spouse name: Not on file  . Number of children: Not on file  . Years of education: Not on file  . Highest education level: Not on file  Occupational History  . Not on file  Social Needs  . Financial resource strain: Not on file  . Food insecurity:    Worry: Not on file   Inability: Not on file  . Transportation needs:    Medical: Not on file    Non-medical: Not on file  Tobacco Use  . Smoking status: Never Smoker  . Smokeless tobacco: Never Used  Substance and Sexual Activity  . Alcohol use: No  . Drug use: No  . Sexual activity: Never  Lifestyle  . Physical activity:    Days per week: Not on file    Minutes per session: Not on file  . Stress: Not on file  Relationships  . Social connections:    Talks on phone: Not on file    Gets together: Not on file    Attends religious service: Not on file    Active member of club or organization: Not on file    Attends meetings of clubs or organizations: Not on file    Relationship status: Not on file  Other Topics Concern  . Not on file  Social History Narrative  . Not on file   Outpatient Encounter Medications as of 10/16/2018  Medication Sig  . amLODipine (NORVASC) 2.5 MG tablet Take 2.5 mg by mouth daily.  . Blood   Glucose Monitoring Suppl (ONETOUCH VERIO) w/Device KIT 1 each by Does not apply route 4 (four) times daily. E11.65  . Cholecalciferol (VITAMIN D3) 2000 units TABS Take 2,000 Units by mouth daily.  . citalopram (CELEXA) 40 MG tablet Take 40 mg by mouth daily.  . Continuous Blood Gluc Sensor (FREESTYLE LIBRE 14 DAY SENSOR) MISC Inject 1 each into the skin every 14 (fourteen) days. Use as directed.  . DEXILANT 60 MG capsule TAKE 1 CAPSULE BY MOUTH ONCE DAILY  . docusate sodium (COLACE) 100 MG capsule Take 100 mg by mouth 2 (two) times daily as needed for mild constipation.  . Ferrous Gluconate (IRON 27 PO) Take 1 tablet by mouth daily.  . gabapentin (NEURONTIN) 800 MG tablet Take 800 mg by mouth 3 (three) times daily.  . insulin lispro (HUMALOG KWIKPEN) 100 UNIT/ML KiwkPen Inject 0.18-0.24 mLs (18-24 Units total) into the skin 3 (three) times daily.  . latanoprost (XALATAN) 0.005 % ophthalmic solution Place 1 drop into both eyes at bedtime.  . LEVEMIR FLEXTOUCH 100 UNIT/ML Pen INJECT 80 UNITS  SUBCUTANEOUSLY AT BEDTIME  . LINZESS 72 MCG capsule Take 72 mcg by mouth as needed.   . lisinopril-hydrochlorothiazide (PRINZIDE,ZESTORETIC) 20-12.5 MG tablet Take 1 tablet by mouth 2 (two) times daily.  . metFORMIN (GLUCOPHAGE XR) 500 MG 24 hr tablet Take 1 tablet (500 mg total) by mouth daily with breakfast.  . ondansetron (ZOFRAN) 4 MG tablet TAKE 1 TABLET BY MOUTH EVERY 6 HOURS AS NEEDED FOR NAUSEA AND VOMITING  . ONETOUCH VERIO test strip USE 1 STRIP TO CHECK GLUCOSE 4 TIMES DAILY  . pravastatin (PRAVACHOL) 40 MG tablet Take 40 mg by mouth daily.  . pyridOXINE (VITAMIN B-6) 100 MG tablet Take 100 mg by mouth daily.  . sucralfate (CARAFATE) 1 g tablet Take 1 tablet (1 g total) by mouth 3 (three) times daily with meals. Crush tablet in 5cc of water prior to taking. (Patient taking differently: Take 1 g by mouth as needed. Crush tablet in 5cc of water prior to taking.)  . sucralfate (CARAFATE) 1 g tablet Take 1 tablet (1 g total) by mouth 4 (four) times daily -  with meals and at bedtime. (Patient taking differently: Take 1 g by mouth as needed. )  . Suvorexant (BELSOMRA) 20 MG TABS Take 20 mg by mouth at bedtime.   . vitamin B-12 (CYANOCOBALAMIN) 500 MCG tablet Take 500 mcg by mouth daily.   No facility-administered encounter medications on file as of 10/16/2018.    ALLERGIES: Allergies  Allergen Reactions  . Sulfa Antibiotics    VACCINATION STATUS:  There is no immunization history on file for this patient.  Diabetes  She presents for her follow-up diabetic visit. She has type 2 diabetes mellitus. Onset time: She was diagnosed at approximate age of 68 years. Her disease course has been fluctuating, recently showing some improvement.  There are no hypoglycemic associated symptoms. Pertinent negatives for hypoglycemia include no confusion, headaches, pallor or seizures. Associated symptoms include fatigue, polydipsia and polyuria. Pertinent negatives for diabetes include no blurred vision,  no chest pain and no polyphagia. There are no hypoglycemic complications.   There are no diabetic complications. Risk factors for coronary artery disease include diabetes mellitus, dyslipidemia, obesity and sedentary lifestyle.  -She reports that her fasting blood glucose ranges from 108-288, lunchtime readings from 133-2 83, suppertime readings from 121-131, bedtime readings from 144-236 .  Her previsit labs show A1c of 9.3%, improving from 11%.    She is following   a generally unhealthy diet. When asked about meal planning, she reported none. She has had a previous visit with a dietitian (She will see the dietitian today.). She never participates in exercise.   ) An ACE inhibitor/angiotensin II receptor blocker is not being taken. Eye exam is current.  Hyperlipidemia  This is a chronic problem. The current episode started more than 1 year ago. Recent lipid tests were reviewed and are high. Exacerbating diseases include diabetes and obesity. Pertinent negatives include no chest pain, myalgias or shortness of breath. She is currently on no antihyperlipidemic treatment. Compliance problems include adherence to diet and adherence to exercise.  Risk factors for coronary artery disease include diabetes mellitus, dyslipidemia, hypertension, obesity and a sedentary lifestyle.     Objective:      Labs: Recent Results (from the past 2160 hour(s))  Basic metabolic panel     Status: None   Collection Time: 10/05/18 12:00 AM  Result Value Ref Range   BUN 14 4 - 21   Creatinine 0.6 0.5 - 1.1  Hemoglobin A1c     Status: None   Collection Time: 10/05/18 12:00 AM  Result Value Ref Range   Hemoglobin A1C 9.3     Labs from June 18, 2018 showed BUN 8 creatinine 051, A1c 11% Labs from January 24, 2018 showed BUN 9,  Creatinine 0.58, A1c 10.7% September 20, 2017 A1c 10.5% 05/22/2017 show A1c of 9.5% 02/22/2017 show A1c of 9.5% 08/08/2016 show A1c of 9.6%. 04/20/2016: Her A1c was 11.4% A1c was 10.3%  on 01/12/2016,   Assessment & Plan:   1. Uncontrolled type 2 diabetes mellitus with complication, with long-term current use of insulin (HCC) -This is a telephone visit during the coronavirus pandemic. - Patient has currently uncontrolled symptomatic type 2 DM since  68 years of age. -Her previsit labs show A1c of 9.3%, improving from 11%.   - Recent labs are reviewed, showing normal renal function.   Her diabetes is complicated by noncompliance/nonadherence, obesity and sedentary life and patient remains at a high risk for more acute and chronic complications of diabetes which include CAD, CVA, CKD, retinopathy, and neuropathy. These are all discussed in detail with the patient.  - Patient admits there is a room for improvement in her diet and drink choices. -  Suggestion is made for her to avoid simple carbohydrates  from her diet including Cakes, Sweet Desserts / Pastries, Ice Cream, Soda (diet and regular), Sweet Tea, Candies, Chips, Cookies, Store Bought Juices, Alcohol in Excess of  1-2 drinks a day, Artificial Sweeteners, and "Sugar-free" Products. This will help patient to have stable blood glucose profile and potentially avoid unintended weight gain.   - I have approached patient with the following individualized plan to manage diabetes and patient agrees:   -she is struggling to achieve control of diabetes, likely deals with moderate cognitive deficit.    -She will continue to need intensive treatment with basal/bolus insulin in order for her to achieve control of diabetes to target.    -She has questionable compliance and cognitive ability, her treatment will be adjusted slowly.   -She is advised to continue Levemir 80 units nightly, Humalog 18  units  3 times daily before meals for pre-meal BG readings of 90 -150mg/dl, plus patient specific correction dose for unexpected hyperglycemia above 150mg/dl, associated with strict monitoring of glucose 4 times a day-before meals and at  bedtime. - Patient is warned not to take insulin without proper monitoring per orders.  -Patient is   encouraged to call clinic for blood glucose levels less than 70 or above 300 mg /dl.  She wishes to retry metformin, I discussed and initiated metformin 500 mg ER daily after supper.  -She is not a reliable candidate for incretin therapy.   2) Lipids/HPL:  Control unknown, I will obtain fasting lipid panel on subsequent visits.  She is advised to continue pravastatin 40 mg p.o. nightly.    - Patient Care Time Today:  25 min, of which >50% was spent in reviewing her  current and  previous labs/studies, previous treatments, and medications doses and developing a plan for long-term care based on the latest recommendations for standards of care.  Lamiyah K Cooperman participated in the discussions, expressed understanding, and voiced agreement with the above plans.  All questions were answered to her satisfaction. she is encouraged to contact clinic should she have any questions or concerns prior to her return visit.   Follow up plan: -Return in 3 months with labs, logs, and meter. Gebre Delia Slatten, MD Phone: [phone]  Fax: [phone]  -  This note was partially dictated with voice recognition software. Similar sounding words can be transcribed inadequately or may not  be corrected upon review.  10/16/2018, 1:34 PM 

## 2018-10-16 NOTE — Patient Instructions (Addendum)
Endocrinology Telephone Visit Follow up Note -During COVID -19 Pandemic   Subjective:    Patient ID: Carrie Turner, female    DOB: [birth year]. Patient is being seen in follow-up for management of diabetes requested by  Monico Blitz, MD  Past Medical History:  Diagnosis Date  . Anxiety   . COPD (chronic obstructive pulmonary disease) ([place])   . Depression   . Diabetes mellitus, type II ([place])   . GERD (gastroesophageal reflux disease)   . Glaucoma   . Hyperlipidemia   . Hypertension   . Migraines   . Neuropathy   . Seizures ([place])    seizure was from ETOH, "a long time ago", no med and no recurrance  . Vitamin D deficiency    Past Surgical History:  Procedure Laterality Date  . ABDOMINAL HYSTERECTOMY    . COLONOSCOPY  11/2015   Dr. Britta Mccreedy: sessile polyp removed (benign). advised repeat colonoscopy in 5 years.   . COLONOSCOPY WITH PROPOFOL N/A 10/26/2017   Procedure: COLONOSCOPY WITH PROPOFOL;  Surgeon: Daneil Dolin, MD;  [facility];  Service: Endoscopy;  Laterality: N/A;  12:45pm  . ESOPHAGEAL DILATION  10/26/2017   Procedure: ESOPHAGEAL DILATION;  Surgeon: Daneil Dolin, MD;  [facility];  Service: Endoscopy;;  . ESOPHAGOGASTRODUODENOSCOPY  11/2015   Dr. Britta Mccreedy: hiatal hernia  . ESOPHAGOGASTRODUODENOSCOPY (EGD) WITH PROPOFOL N/A 10/26/2017   Procedure: ESOPHAGOGASTRODUODENOSCOPY (EGD) WITH PROPOFOL;  Surgeon: Daneil Dolin, MD;  [facility];  Service: Endoscopy;  Laterality: N/A;  . KNEE ARTHROSCOPY Right   . ORIF ANKLE FRACTURE Right    Social History   Socioeconomic History  . Marital status: Single    Spouse name: Not on file  . Number of children: Not on file  . Years of education: Not on file  . Highest education level: Not on file  Occupational History  . Not on file  Social Needs  . Financial resource strain: Not on file  . Food insecurity:    Worry: Not on file   Inability: Not on file  . Transportation needs:    Medical: Not on file    Non-medical: Not on file  Tobacco Use  . Smoking status: Never Smoker  . Smokeless tobacco: Never Used  Substance and Sexual Activity  . Alcohol use: No  . Drug use: No  . Sexual activity: Never  Lifestyle  . Physical activity:    Days per week: Not on file    Minutes per session: Not on file  . Stress: Not on file  Relationships  . Social connections:    Talks on phone: Not on file    Gets together: Not on file    Attends religious service: Not on file    Active member of club or organization: Not on file    Attends meetings of clubs or organizations: Not on file    Relationship status: Not on file  Other Topics Concern  . Not on file  Social History Narrative  . Not on file   Outpatient Encounter Medications as of 10/16/2018  Medication Sig  . amLODipine (NORVASC) 2.5 MG tablet Take 2.5 mg by mouth daily.  . Blood  Glucose Monitoring Suppl (ONETOUCH VERIO) w/Device KIT 1 each by Does not apply route 4 (four) times daily. E11.65  . Cholecalciferol (VITAMIN D3) 2000 units TABS Take 2,000 Units by mouth daily.  . citalopram (CELEXA) 40 MG tablet Take 40 mg by mouth daily.  . Continuous Blood Gluc Sensor (FREESTYLE LIBRE 14 DAY SENSOR) MISC Inject 1 each into the skin every 14 (fourteen) days. Use as directed.  Marland Kitchen DEXILANT 60 MG capsule TAKE 1 CAPSULE BY MOUTH ONCE DAILY  . docusate sodium (COLACE) 100 MG capsule Take 100 mg by mouth 2 (two) times daily as needed for mild constipation.  . Ferrous Gluconate (IRON 27 PO) Take 1 tablet by mouth daily.  Marland Kitchen gabapentin (NEURONTIN) 800 MG tablet Take 800 mg by mouth 3 (three) times daily.  . insulin lispro (HUMALOG KWIKPEN) 100 UNIT/ML KiwkPen Inject 0.18-0.24 mLs (18-24 Units total) into the skin 3 (three) times daily.  Marland Kitchen latanoprost (XALATAN) 0.005 % ophthalmic solution Place 1 drop into both eyes at bedtime.  Marland Kitchen LEVEMIR FLEXTOUCH 100 UNIT/ML Pen INJECT 80 UNITS  SUBCUTANEOUSLY AT BEDTIME  . LINZESS 72 MCG capsule Take 72 mcg by mouth as needed.   Marland Kitchen lisinopril-hydrochlorothiazide (PRINZIDE,ZESTORETIC) 20-12.5 MG tablet Take 1 tablet by mouth 2 (two) times daily.  . metFORMIN (GLUCOPHAGE XR) 500 MG 24 hr tablet Take 1 tablet (500 mg total) by mouth daily with breakfast.  . ondansetron (ZOFRAN) 4 MG tablet TAKE 1 TABLET BY MOUTH EVERY 6 HOURS AS NEEDED FOR NAUSEA AND VOMITING  . ONETOUCH VERIO test strip USE 1 STRIP TO CHECK GLUCOSE 4 TIMES DAILY  . pravastatin (PRAVACHOL) 40 MG tablet Take 40 mg by mouth daily.  Marland Kitchen pyridOXINE (VITAMIN B-6) 100 MG tablet Take 100 mg by mouth daily.  . sucralfate (CARAFATE) 1 g tablet Take 1 tablet (1 g total) by mouth 3 (three) times daily with meals. Crush tablet in 5cc of water prior to taking. (Patient taking differently: Take 1 g by mouth as needed. Crush tablet in 5cc of water prior to taking.)  . sucralfate (CARAFATE) 1 g tablet Take 1 tablet (1 g total) by mouth 4 (four) times daily -  with meals and at bedtime. (Patient taking differently: Take 1 g by mouth as needed. )  . Suvorexant (BELSOMRA) 20 MG TABS Take 20 mg by mouth at bedtime.   . vitamin B-12 (CYANOCOBALAMIN) 500 MCG tablet Take 500 mcg by mouth daily.   No facility-administered encounter medications on file as of 10/16/2018.    ALLERGIES: Allergies  Allergen Reactions  . Sulfa Antibiotics    VACCINATION STATUS:  There is no immunization history on file for this patient.  Diabetes  She presents for her follow-up diabetic visit. She has type 2 diabetes mellitus. Onset time: She was diagnosed at approximate age of 68 years. Her disease course has been fluctuating, recently showing some improvement.  There are no hypoglycemic associated symptoms. Pertinent negatives for hypoglycemia include no confusion, headaches, pallor or seizures. Associated symptoms include fatigue, polydipsia and polyuria. Pertinent negatives for diabetes include no blurred vision,  no chest pain and no polyphagia. There are no hypoglycemic complications.   There are no diabetic complications. Risk factors for coronary artery disease include diabetes mellitus, dyslipidemia, obesity and sedentary lifestyle.  -She reports that her fasting blood glucose ranges from 108-288, lunchtime readings from 133-2 83, suppertime readings from 121-131, bedtime readings from 144-236 .  Her previsit labs show A1c of 9.3%, improving from 11%.    She is following  a generally unhealthy diet. When asked about meal planning, she reported none. She has had a previous visit with a dietitian (She will see the dietitian today.). She never participates in exercise.   ) An ACE inhibitor/angiotensin II receptor blocker is not being taken. Eye exam is current.  Hyperlipidemia  This is a chronic problem. The current episode started more than 1 year ago. Recent lipid tests were reviewed and are high. Exacerbating diseases include diabetes and obesity. Pertinent negatives include no chest pain, myalgias or shortness of breath. She is currently on no antihyperlipidemic treatment. Compliance problems include adherence to diet and adherence to exercise.  Risk factors for coronary artery disease include diabetes mellitus, dyslipidemia, hypertension, obesity and a sedentary lifestyle.     Objective:      Labs: Recent Results (from the past 2160 hour(s))  Basic metabolic panel     Status: None   Collection Time: 10/05/18 12:00 AM  Result Value Ref Range   BUN 14 4 - 21   Creatinine 0.6 0.5 - 1.1  Hemoglobin A1c     Status: None   Collection Time: 10/05/18 12:00 AM  Result Value Ref Range   Hemoglobin A1C 9.3     Labs from June 18, 2018 showed BUN 8 creatinine 051, A1c 11% Labs from January 24, 2018 showed BUN 9,  Creatinine 0.58, A1c 10.7% September 20, 2017 A1c 10.5% 05/22/2017 show A1c of 9.5% 02/22/2017 show A1c of 9.5% 08/08/2016 show A1c of 9.6%. 04/20/2016: Her A1c was 11.4% A1c was 10.3%  on 01/12/2016,   Assessment & Plan:   1. Uncontrolled type 2 diabetes mellitus with complication, with long-term current use of insulin ([place]) -This is a telephone visit during the coronavirus pandemic. - Patient has currently uncontrolled symptomatic type 2 DM since  69 years of age. -Her previsit labs show A1c of 9.3%, improving from 11%.   - Recent labs are reviewed, showing normal renal function.   Her diabetes is complicated by noncompliance/nonadherence, obesity and sedentary life and patient remains at a high risk for more acute and chronic complications of diabetes which include CAD, CVA, CKD, retinopathy, and neuropathy. These are all discussed in detail with the patient.  - Patient admits there is a room for improvement in her diet and drink choices. -  Suggestion is made for her to avoid simple carbohydrates  from her diet including Cakes, Sweet Desserts / Pastries, Ice Cream, Soda (diet and regular), Sweet Tea, Candies, Chips, Cookies, Store Bought Juices, Alcohol in Excess of  1-2 drinks a day, Artificial Sweeteners, and "Sugar-free" Products. This will help patient to have stable blood glucose profile and potentially avoid unintended weight gain.   - I have approached patient with the following individualized plan to manage diabetes and patient agrees:   -she is struggling to achieve control of diabetes, likely deals with moderate cognitive deficit.    -She will continue to need intensive treatment with basal/bolus insulin in order for her to achieve control of diabetes to target.    -She has questionable compliance and cognitive ability, her treatment will be adjusted slowly.   -She is advised to continue Levemir 80 units nightly, Humalog 18  units  3 times daily before meals for pre-meal BG readings of 90 -11m/dl, plus patient specific correction dose for unexpected hyperglycemia above 1556mdl, associated with strict monitoring of glucose 4 times a day-before meals and at  bedtime. - Patient is warned not to take insulin without proper monitoring per orders.  -Patient is  encouraged to call clinic for blood glucose levels less than 70 or above 300 mg /dl.  She wishes to retry metformin, I discussed and initiated metformin 500 mg ER daily after supper.  -She is not a reliable candidate for incretin therapy.   2) Lipids/HPL:  Control unknown, I will obtain fasting lipid panel on subsequent visits.  She is advised to continue pravastatin 40 mg p.o. nightly.    - Patient Care Time Today:  25 min, of which >50% was spent in reviewing her  current and  previous labs/studies, previous treatments, and medications doses and developing a plan for long-term care based on the latest recommendations for standards of care.  Carrie Turner participated in the discussions, expressed understanding, and voiced agreement with the above plans.  All questions were answered to her satisfaction. she is encouraged to contact clinic should she have any questions or concerns prior to her return visit.   Follow up plan: -Return in 3 months with labs, logs, and meter. Glade Lloyd, MD Phone: [phone]  Fax: [phone]  -  This note was partially dictated with voice recognition software. Similar sounding words can be transcribed inadequately or may not  be corrected upon review.  10/16/2018, 1:34 PM

## 2018-11-14 ENCOUNTER — Other Ambulatory Visit: Payer: Self-pay | Admitting: Nurse Practitioner

## 2018-11-14 DIAGNOSIS — K5900 Constipation, unspecified: Secondary | ICD-10-CM

## 2018-11-14 DIAGNOSIS — K219 Gastro-esophageal reflux disease without esophagitis: Secondary | ICD-10-CM

## 2018-11-14 DIAGNOSIS — R1013 Epigastric pain: Secondary | ICD-10-CM

## 2018-11-14 DIAGNOSIS — R110 Nausea: Secondary | ICD-10-CM

## 2018-12-10 ENCOUNTER — Other Ambulatory Visit: Payer: Self-pay | Admitting: Nurse Practitioner

## 2018-12-12 ENCOUNTER — Other Ambulatory Visit: Payer: Self-pay

## 2018-12-13 MED ORDER — DEXLANSOPRAZOLE 60 MG PO CPDR: 1.0000 | DELAYED_RELEASE_CAPSULE | Freq: Every day | ORAL | 3 refills | Status: DC

## 2019-01-17 ENCOUNTER — Ambulatory Visit: Payer: Medicare Other | Admitting: "Endocrinology

## 2019-02-06 LAB — LIPID PANEL
Cholesterol: 372 — AB (ref 0–200)
HDL: 56 (ref 35–70)
Triglycerides: 668 — AB (ref 40–160)

## 2019-02-06 LAB — BASIC METABOLIC PANEL
BUN: 15 (ref 4–21)
Creatinine: 0.5 (ref 0.5–1.1)

## 2019-02-19 ENCOUNTER — Other Ambulatory Visit: Payer: Self-pay | Admitting: "Endocrinology

## 2019-02-20 ENCOUNTER — Ambulatory Visit (INDEPENDENT_AMBULATORY_CARE_PROVIDER_SITE_OTHER): Payer: Medicare Other | Admitting: "Endocrinology

## 2019-02-20 ENCOUNTER — Other Ambulatory Visit: Payer: Self-pay

## 2019-02-20 ENCOUNTER — Encounter: Payer: Self-pay | Admitting: "Endocrinology

## 2019-02-20 DIAGNOSIS — Z794 Long term (current) use of insulin: Secondary | ICD-10-CM

## 2019-02-20 DIAGNOSIS — E1165 Type 2 diabetes mellitus with hyperglycemia: Secondary | ICD-10-CM

## 2019-02-20 DIAGNOSIS — E782 Mixed hyperlipidemia: Secondary | ICD-10-CM | POA: Diagnosis not present

## 2019-02-20 DIAGNOSIS — I10 Essential (primary) hypertension: Secondary | ICD-10-CM | POA: Diagnosis not present

## 2019-02-20 DIAGNOSIS — E118 Type 2 diabetes mellitus with unspecified complications: Secondary | ICD-10-CM

## 2019-02-20 DIAGNOSIS — IMO0002 Reserved for concepts with insufficient information to code with codable children: Secondary | ICD-10-CM

## 2019-02-20 MED ORDER — GLIPIZIDE ER 2.5 MG PO TB24: 2.5000 mg | ORAL_TABLET | Freq: Every day | ORAL | 3 refills | Status: DC

## 2019-02-20 NOTE — Progress Notes (Signed)
02/21/2019                                                    Endocrinology Telehealth Visit Follow up Note -During COVID -19 Pandemic  This visit type was conducted due to national recommendations for restrictions regarding the COVID-19 Pandemic  in an effort to limit this patient's exposure and mitigate transmission of the corona virus.  Due to her co-morbid illnesses, Carrie Turner is at  moderate to high risk for complications without adequate follow up.  This format is felt to be most appropriate for her at this time.  I connected with this patient on 02/21/2019   by telephone and verified that I am speaking with the correct person using two identifiers. Carrie Turner, 1950-10-21. she has verbally consented to this visit. All issues noted in this document were discussed and addressed. The format was not optimal for physical exam.     Subjective:    Patient ID: Carrie Turner, female    DOB: 1950-11-17. Patient is being engaged in telehealth via telephone in follow-up for management of diabetes requested by  Monico Blitz, MD  Past Medical History:  Diagnosis Date  . Anxiety   . COPD (chronic obstructive pulmonary disease) ([place])   . Depression   . Diabetes mellitus, type II ([place])   . GERD (gastroesophageal reflux disease)   . Glaucoma   . Hyperlipidemia   . Hypertension   . Migraines   . Neuropathy   . Seizures ([place])    seizure was from ETOH, "a long time ago", no med and no recurrance  . Vitamin D deficiency    Past Surgical History:  Procedure Laterality Date  . ABDOMINAL HYSTERECTOMY    . COLONOSCOPY  11/2015   Dr. Britta Mccreedy: sessile polyp removed (benign). advised repeat colonoscopy in 5 years.   . COLONOSCOPY WITH PROPOFOL N/A 10/26/2017   Procedure: COLONOSCOPY WITH PROPOFOL;  Surgeon: Daneil Dolin, MD;  [facility];  Service: Endoscopy;  Laterality: N/A;  12:45pm  . ESOPHAGEAL DILATION  10/26/2017   Procedure: ESOPHAGEAL DILATION;  Surgeon: Daneil Dolin,  MD;  [facility];  Service: Endoscopy;;  . ESOPHAGOGASTRODUODENOSCOPY  11/2015   Dr. Britta Mccreedy: hiatal hernia  . ESOPHAGOGASTRODUODENOSCOPY (EGD) WITH PROPOFOL N/A 10/26/2017   Procedure: ESOPHAGOGASTRODUODENOSCOPY (EGD) WITH PROPOFOL;  Surgeon: Daneil Dolin, MD;  [facility];  Service: Endoscopy;  Laterality: N/A;  . KNEE ARTHROSCOPY Right   . ORIF ANKLE FRACTURE Right     Social History   Socioeconomic History  . Marital status: Single    Spouse name: Not on file  . Number of children: Not on file  . Years of education: Not on file  . Highest education level: Not on file  Occupational History  . Not on file  Social Needs  . Financial resource strain: Not on file  . Food insecurity:    Worry: Not on file    Inability: Not on file  . Transportation needs:    Medical: Not on file    Non-medical: Not on file  Tobacco Use  . Smoking status: Never Smoker  . Smokeless tobacco: Never Used  Substance and Sexual Activity  . Alcohol use: No  . Drug use: No  . Sexual activity: Never  Lifestyle  . Physical activity:  Days per week: Not on file    Minutes per session: Not on file  . Stress: Not on file  Relationships  . Social connections:    Talks on phone: Not on file    Gets together: Not on file    Attends religious service: Not on file    Active member of club or organization: Not on file    Attends meetings of clubs or organizations: Not on file    Relationship status: Not on file  Other Topics Concern  . Not on file  Social History Narrative  . Not on file   Outpatient Encounter Medications as of 10/16/2018  Medication Sig  . amLODipine (NORVASC) 2.5 MG tablet Take 2.5 mg by mouth daily.  . Blood Glucose Monitoring Suppl (ONETOUCH VERIO) w/Device KIT 1 each by Does not apply route 4 (four) times daily. E11.65  . Cholecalciferol (VITAMIN D3) 2000 units TABS Take 2,000 Units by mouth daily.  . citalopram (CELEXA) 40 MG tablet Take 40 mg by  mouth daily.  . Continuous Blood Gluc Sensor (FREESTYLE LIBRE 14 DAY SENSOR) MISC Inject 1 each into the skin every 14 (fourteen) days. Use as directed.  Marland Kitchen DEXILANT 60 MG capsule TAKE 1 CAPSULE BY MOUTH ONCE DAILY  . docusate sodium (COLACE) 100 MG capsule Take 100 mg by mouth 2 (two) times daily as needed for mild constipation.  . Ferrous Gluconate (IRON 27 PO) Take 1 tablet by mouth daily.  Marland Kitchen gabapentin (NEURONTIN) 800 MG tablet Take 800 mg by mouth 3 (three) times daily.  . insulin lispro (HUMALOG KWIKPEN) 100 UNIT/ML KiwkPen Inject 0.18-0.24 mLs (18-24 Units total) into the skin 3 (three) times daily.  Marland Kitchen latanoprost (XALATAN) 0.005 % ophthalmic solution Place 1 drop into both eyes at bedtime.  Marland Kitchen LEVEMIR FLEXTOUCH 100 UNIT/ML Pen INJECT 80 UNITS SUBCUTANEOUSLY AT BEDTIME  . LINZESS 72 MCG capsule Take 72 mcg by mouth as needed.   Marland Kitchen lisinopril-hydrochlorothiazide (PRINZIDE,ZESTORETIC) 20-12.5 MG tablet Take 1 tablet by mouth 2 (two) times daily.  . metFORMIN (GLUCOPHAGE XR) 500 MG 24 hr tablet Take 1 tablet (500 mg total) by mouth daily with breakfast.  . ondansetron (ZOFRAN) 4 MG tablet TAKE 1 TABLET BY MOUTH EVERY 6 HOURS AS NEEDED FOR NAUSEA AND VOMITING  . ONETOUCH VERIO test strip USE 1 STRIP TO CHECK GLUCOSE 4 TIMES DAILY  . pravastatin (PRAVACHOL) 40 MG tablet Take 40 mg by mouth daily.  Marland Kitchen pyridOXINE (VITAMIN B-6) 100 MG tablet Take 100 mg by mouth daily.  . sucralfate (CARAFATE) 1 g tablet Take 1 tablet (1 g total) by mouth 3 (three) times daily with meals. Crush tablet in 5cc of water prior to taking. (Patient taking differently: Take 1 g by mouth as needed. Crush tablet in 5cc of water prior to taking.)  . sucralfate (CARAFATE) 1 g tablet Take 1 tablet (1 g total) by mouth 4 (four) times daily -  with meals and at bedtime. (Patient taking differently: Take 1 g by mouth as needed. )  . Suvorexant (BELSOMRA) 20 MG TABS Take 20 mg by mouth at bedtime.   . vitamin B-12 (CYANOCOBALAMIN) 500  MCG tablet Take 500 mcg by mouth daily.   No facility-administered encounter medications on file as of 10/16/2018.    Allergies  Allergen Reactions  . Sulfa Antibiotics      VACCINATION STATUS:  There is no immunization history on file for this patient.  Diabetes  She presents for her follow-up diabetic visit. She has type  2 diabetes mellitus. Onset time: She was diagnosed at approximate age of 66 years. Her disease course has been fluctuating, recently showing some improvement.  She reports her glycemic profile which show improvement with no hypoglycemia.  -Pertinent negatives for hypoglycemia include no confusion, headaches, pallor or seizures. Associated symptoms include fatigue, polydipsia and polyuria. Pertinent negatives for diabetes include no blurred vision, no chest pain and no polyphagia. There are no hypoglycemic complications.   There are no diabetic complications. Risk factors for coronary artery disease include diabetes mellitus, dyslipidemia, obesity and sedentary lifestyle.  -Her previsit labs did not include A1c, during her last visit was 9.3% improving from 11%.    She is following a generally unhealthy diet. When asked about meal planning, she reported none. She has had a previous visit with a dietitian (She will see the dietitian today.). She never participates in exercise.   ) An ACE inhibitor/angiotensin II receptor blocker is not being taken. Eye exam is current.  Hyperlipidemia  This is a chronic problem. The current episode started more than 1 year ago. Recent lipid tests were reviewed and are high. Exacerbating diseases include diabetes and obesity. Pertinent negatives include no chest pain, myalgias or shortness of breath. She is currently on no antihyperlipidemic treatment. Compliance problems include adherence to diet and adherence to exercise.  Risk factors for coronary artery disease include diabetes mellitus, dyslipidemia, hypertension, obesity and a sedentary  lifestyle.     Objective:    Recent Results (from the past 2160 hour(s))  Basic metabolic panel     Status: None   Collection Time: 02/06/19 12:00 AM  Result Value Ref Range   BUN 15 4 - 21   Creatinine 0.5 0.5 - 1.1  Lipid panel     Status: Abnormal   Collection Time: 02/06/19 12:00 AM  Result Value Ref Range   Triglycerides 668 (A) 40 - 160   Cholesterol 372 (A) 0 - 200   HDL 56 35 - 70     Labs: Recent Results (from the past 2160 hour(s))  Basic metabolic panel     Status: None   Collection Time: 10/05/18 12:00 AM  Result Value Ref Range   BUN 14 4 - 21   Creatinine 0.6 0.5 - 1.1  Hemoglobin A1c     Status: None   Collection Time: 10/05/18 12:00 AM  Result Value Ref Range   Hemoglobin A1C 9.3     Labs from June 18, 2018 showed BUN 8 creatinine 051, A1c 11% Labs from January 24, 2018 showed BUN 9,  Creatinine 0.58, A1c 10.7% September 20, 2017 A1c 10.5%    Assessment & Plan:   1. Uncontrolled type 2 diabetes mellitus with complication, with long-term current use of insulin ([place])  - Patient has currently uncontrolled symptomatic type 2 DM since  68 years of age. -Her previsit labs did not include A1c, during her last visit was 9.3% improving from 11%.    - Recent labs are reviewed, showing normal renal function.   Her diabetes is complicated by noncompliance/nonadherence, obesity and sedentary life and patient remains at a high risk for more acute and chronic complications of diabetes which include CAD, CVA, CKD, retinopathy, and neuropathy. These are all discussed in detail with the patient.  - she  admits there is a room for improvement in her diet and drink choices. -  Suggestion is made for her to avoid simple carbohydrates  from her diet including Cakes, Sweet Desserts / Pastries, Ice Cream, Soda (diet  and regular), Sweet Tea, Candies, Chips, Cookies, Sweet Pastries,  Store Bought Juices, Alcohol in Excess of  1-2 drinks a day, Artificial Sweeteners, Coffee  Creamer, and "Sugar-free" Products. This will help patient to have stable blood glucose profile and potentially avoid unintended weight gain.  - I have approached patient with the following individualized plan to manage diabetes and patient agrees:   -she is struggling to achieve control of diabetes, likely deals with moderate cognitive deficit.    -She will continue to need intensive treatment with basal/bolus insulin in order for her to achieve control of diabetes to target.     -She has questionable compliance and cognitive ability, her treatment will be adjusted slowly.   -She is advised to continue Levemir 80 units nightly, continue Humalog 18 units 3 times daily  before meals for pre-meal BG readings of 90 -117m/dl, plus patient specific correction dose for unexpected hyperglycemia above 1523mdl, associated with strict monitoring of glucose 4 times a day-before meals and at bedtime. - Patient is warned not to take insulin without proper monitoring per orders.  -Patient is encouraged to call clinic for blood glucose levels less than 70 or above 300 mg /dl.  She did not tolerate retrial of low-dose metformin.  She is advised to discontinue.    I discussed and added glipizide 2.5 mg p.o. daily at breakfast.  -She is not a reliable candidate for incretin therapy.   2) Lipids/HPL: She has severely uncontrolled hypertriglyceridemia, LDL not calculated.  She is advised to continue pravastatin 40 mg p.o. nightly, advised to cut back on butter and fried food.  Fenofibrate will be added to her regimen to address hypertriglyceridemia.    3) hypertension: she is advised to home monitor blood pressure and report if > 140/90 on 2 separate readings. She is advised to continue follow-up lisinopril/hydrochlorothiazide 20/12.5 mg daily.  - Patient Care Time Today:  25 min, of which >50% was spent in  counseling and the rest reviewing her  current and  previous labs/studies, previous treatments, her  blood glucose readings, and medications' doses and developing a plan for long-term care based on the latest recommendations for standards of care.   Carrie Sheldonarticipated in the discussions, expressed understanding, and voiced agreement with the above plans.  All questions were answered to her satisfaction. she is encouraged to contact clinic should she have any questions or concerns prior to her return visit.    Follow up plan: -Return in 3 months with labs, logs, and meter. Carrie LloydMD Phone: [phone]: [phone]-  This note was partially dictated with voice recognition software. Similar sounding words can be transcribed inadequately or may not  be corrected upon review.  10/16/2018, 1:34 PM

## 2019-02-21 MED ORDER — FENOFIBRATE 145 MG PO TABS: 145.0000 mg | ORAL_TABLET | Freq: Every day | ORAL | 3 refills | Status: DC

## 2019-02-27 ENCOUNTER — Encounter: Payer: Self-pay | Admitting: Nurse Practitioner

## 2019-02-27 ENCOUNTER — Ambulatory Visit (INDEPENDENT_AMBULATORY_CARE_PROVIDER_SITE_OTHER): Payer: Medicare Other | Admitting: Nurse Practitioner

## 2019-02-27 ENCOUNTER — Other Ambulatory Visit: Payer: Self-pay

## 2019-02-27 VITALS — BP 156/82 | HR 86 | Temp 97.1°F | Ht 62.0 in | Wt 190.4 lb

## 2019-02-27 DIAGNOSIS — K5900 Constipation, unspecified: Secondary | ICD-10-CM

## 2019-02-27 DIAGNOSIS — R110 Nausea: Secondary | ICD-10-CM | POA: Diagnosis not present

## 2019-02-27 MED ORDER — ONDANSETRON HCL 8 MG PO TABS: 8.0000 mg | ORAL_TABLET | Freq: Three times a day (TID) | ORAL | 3 refills | Status: DC | PRN

## 2019-02-27 NOTE — Patient Instructions (Signed)
Your health issues we discussed today were:   Constipation: 1. Stop taking Linzess. 2. I am giving you samples of Amitiza 8 mcg.  Take this twice a day, with food or after eating 3. Call us in 1 to 2 weeks and let us know if it is helping you have more regular bowel movements 4. Call us if you have any severe worsening symptoms  Nausea: 1. I sent in a prescription for Zofran 8 mg.  You can take this up to every 8 hours as needed for nausea 2. Call us if you have any severe worsening symptoms  Overall I recommend:  1. Continue your other current medications 2. Follow-up in 3 months 3. Call us if you have any questions or concerns.   Because of recent events of COVID-19 ("Coronavirus"), follow CDC recommendations:  1. Wash your hand frequently 2. Avoid touching your face 3. Stay away from people who are sick 4. If you have symptoms such as fever, cough, shortness of breath then call your healthcare provider for further guidance 5. If you are sick, STAY AT HOME unless otherwise directed by your healthcare provider. 6. Follow directions from state and national officials regarding staying safe   At [facility] we value your feedback. You may receive a survey about your visit today. Please share your experience as we strive to create trusting relationships with our patients to provide genuine, compassionate, quality care.  We appreciate your understanding and patience as we review any laboratory studies, imaging, and other diagnostic tests that are ordered as we care for you. Our office policy is 5 business days for review of these results, and any emergent or urgent results are addressed in a timely manner for your best interest. If you do not hear from our office in 1 week, please contact us.   We also encourage the use of MyChart, which contains your medical information for your review as well. If you are not enrolled in this feature, an access code is on this after visit  summary for your convenience. Thank you for allowing [place] to be involved in your care.  It was great to see you today!  I hope you have a great summer!!

## 2019-02-27 NOTE — Progress Notes (Signed)
Referring Provider: Monico Blitz, MD Primary Care Physician:  Monico Blitz, MD Primary GI:  Dr. Gala Romney  Chief Complaint  Patient presents with   Nausea    when she first wakes up in the morning    HPI:   Carrie Turner is a 68 y.o. female who presents for follow-up.  The patient was last seen in our office 08/29/2018 for GERD, constipation, nausea, epigastric abdominal pain.  Colonoscopy up-to-date 2019 with a single tubular adenoma and recommended 5-year repeat in 2024.  Random colon biopsies were negative.  EGD at same time with hiatal hernia otherwise unremarkable.  Persistent nausea and vomiting felt likely due to uncontrolled diabetes with a recent hemoglobin A1c of 11.  Antiemetics help.  Alkaline phosphatase 02/21/2018 elevated at 157 with a GGT at 40 and recommended recheck LFTs in 6 months.  At her last visit she noted Linzess causing intermittent loose stools and incontinence and has been skipping doses.  The day prior she felt an onset of nausea that she thought was due to her constipation and she took to [place] to help.  Uses Zofran which helps a lot.  [place] working well.  Feels Linzess works better if she takes it at night.  No other GI complaints.  15 pound intentional weight loss working with diabetes nutritionist and lifestyle changes.  Recommended refill of Zofran sent to the pharmacy, adjust Linzess to 72 mcg every other day for open the capsule intake a half dose every day.  Follow-up in 3 to 4 weeks with progress report.  Continue Dexilant and follow-up in 6 months.  No progress report was called to our office.  Today she states she's doing ok overall. Has morning-time nausea and she thinks it's from DM. Denies vomiting. Zofran helps. She is asking for 8 mg dose because that's what her daughter takes. She is on Linzess but hasn't gone to the reduced dose as previously recommended. She states she cannot take it every day or she'll have diarrhea. If she doesn't take it she  has constipation. Cannot eat a whole lot, feels full sooner (early satiety). Denies hematochezia, melena, fever, chills, unintentional weight loss. Denies URI or flu-like symptoms. Denies loss of sense of taste or smell. Denies chest pain, dyspnea, dizziness, lightheadedness, syncope, near syncope. Denies any other upper or lower GI symptoms.  She uses Peppermint oil and ginger chews for nausea as well.  Past Medical History:  Diagnosis Date   Anxiety    COPD (chronic obstructive pulmonary disease) ([place])    Depression    Diabetes mellitus, type II ([place])    GERD (gastroesophageal reflux disease)    Glaucoma    Hyperlipidemia    Hypertension    Migraines    Neuropathy    Seizures ([facility])    seizure was from ETOH, "a long time ago", no med and no recurrance   Vitamin D deficiency     Past Surgical History:  Procedure Laterality Date   ABDOMINAL HYSTERECTOMY     COLONOSCOPY  11/2015   Dr. Britta Mccreedy: sessile polyp removed (benign). advised repeat colonoscopy in 5 years.    COLONOSCOPY WITH PROPOFOL N/A 10/26/2017   Procedure: COLONOSCOPY WITH PROPOFOL;  Surgeon: Daneil Dolin, MD;  [facility]: [facility];  Service: Endoscopy;  Laterality: N/A;  12:45pm   ESOPHAGEAL DILATION  10/26/2017   Procedure: ESOPHAGEAL DILATION;  Surgeon: Daneil Dolin, MD;  Location: [facility];  Service: Endoscopy;;   ESOPHAGOGASTRODUODENOSCOPY  11/2015   Dr. Britta Mccreedy:  hiatal hernia   ESOPHAGOGASTRODUODENOSCOPY (EGD) WITH PROPOFOL N/A 10/26/2017   Procedure: ESOPHAGOGASTRODUODENOSCOPY (EGD) WITH PROPOFOL;  Surgeon: Daneil Dolin, MD;  Location: [facility];  Service: Endoscopy;  Laterality: N/A;   KNEE ARTHROSCOPY Right    ORIF ANKLE FRACTURE Right     Current Outpatient Medications  Medication Sig Dispense Refill   amLODipine (NORVASC) 2.5 MG tablet Take 2.5 mg by mouth daily.     Blood Glucose Monitoring Suppl (ONETOUCH VERIO) w/Device KIT 1 each by Does not apply route 4 (four)  times daily. E11.65 1 kit 0   Cholecalciferol (VITAMIN D3) 2000 units TABS Take 2,000 Units by mouth daily.     citalopram (CELEXA) 40 MG tablet Take 40 mg by mouth daily.     Continuous Blood Gluc Sensor (FREESTYLE LIBRE 14 DAY SENSOR) MISC Inject 1 each into the skin every 14 (fourteen) days. Use as directed. 2 each 2   dexlansoprazole (DEXILANT) 60 MG capsule Take 1 capsule (60 mg total) by mouth daily. 90 capsule 3   docusate sodium (COLACE) 100 MG capsule Take 100 mg by mouth 2 (two) times daily as needed for mild constipation.     fenofibrate (TRICOR) 145 MG tablet Take 1 tablet (145 mg total) by mouth daily. 30 tablet 3   Ferrous Gluconate (IRON 27 PO) Take 1 tablet by mouth daily.     gabapentin (NEURONTIN) 800 MG tablet Take 800 mg by mouth 3 (three) times daily.     glipiZIDE (GLUCOTROL XL) 2.5 MG 24 hr tablet Take 1 tablet (2.5 mg total) by mouth daily with breakfast. 30 tablet 3   insulin lispro (HUMALOG) 100 UNIT/ML KwikPen INJECT 18-24 UNITS INTO THE SKIN 3 TIMES DAILY 30 mL 2   latanoprost (XALATAN) 0.005 % ophthalmic solution Place 1 drop into both eyes at bedtime.     LEVEMIR FLEXTOUCH 100 UNIT/ML Pen INJECT 80 UNITS SUBCUTANEOUSLY AT BEDTIME 60 mL 2   LINZESS 72 MCG capsule Take 72 mcg by mouth as needed.   3   lisinopril-hydrochlorothiazide (PRINZIDE,ZESTORETIC) 20-12.5 MG tablet Take 1 tablet by mouth 2 (two) times daily.     ondansetron (ZOFRAN) 4 MG tablet TAKE 1 TABLET EVERY 6 HOURS AS NEEDED FOR NAUSEA & VOMITING. 30 tablet 3   ONETOUCH VERIO test strip USE 1 STRIP TO CHECK GLUCOSE 4 TIMES DAILY 150 each 5   pravastatin (PRAVACHOL) 40 MG tablet Take 40 mg by mouth daily.     pyridOXINE (VITAMIN B-6) 100 MG tablet Take 100 mg by mouth daily.     sucralfate (CARAFATE) 1 g tablet Take 1 tablet (1 g total) by mouth 4 (four) times daily -  with meals and at bedtime. (Patient taking differently: Take 1 g by mouth as needed. ) 90 tablet 2   Suvorexant  (BELSOMRA) 20 MG TABS Take 20 mg by mouth at bedtime.      vitamin B-12 (CYANOCOBALAMIN) 500 MCG tablet Take 500 mcg by mouth daily.     No current facility-administered medications for this visit.     Allergies as of 02/27/2019 - Review Complete 02/27/2019  Allergen Reaction Noted   Sulfa antibiotics  03/04/2016    Family History  Problem Relation Age of Onset   Hypertension Mother    Colon cancer Mother        diagnosed early 43s   Cerebral aneurysm Mother    Alcohol abuse Father    Pancreatic cancer Sister        deceased in 59s  Colon cancer Sister        diagnosed 39 y/o   Stroke Brother    Cancer Maternal Grandmother    Cancer Maternal Grandfather    Cancer Paternal Grandmother    Other Paternal Grandfather    Other Brother     Social History   Socioeconomic History   Marital status: Single    Spouse name: Not on file   Number of children: Not on file   Years of education: Not on file   Highest education level: Not on file  Occupational History   Not on file  Social Needs   Financial resource strain: Not on file   Food insecurity    Worry: Not on file    Inability: Not on file   Transportation needs    Medical: Not on file    Non-medical: Not on file  Tobacco Use   Smoking status: Never Smoker   Smokeless tobacco: Never Used  Substance and Sexual Activity   Alcohol use: No   Drug use: No   Sexual activity: Never  Lifestyle   Physical activity    Days per week: Not on file    Minutes per session: Not on file   Stress: Not on file  Relationships   Social connections    Talks on phone: Not on file    Gets together: Not on file    Attends religious service: Not on file    Active member of club or organization: Not on file    Attends meetings of clubs or organizations: Not on file    Relationship status: Not on file  Other Topics Concern   Not on file  Social History Narrative   Not on file    Review of  Systems: General: Negative for anorexia, weight loss, fever, chills, fatigue, weakness. ENT: Negative for hoarseness, difficulty swallowing. CV: Negative for chest pain, angina, palpitations, peripheral edema.  Respiratory: Negative for dyspnea at rest, cough, sputum, wheezing.  GI: See history of present illness. Endo: Negative for unusual weight change.  Heme: Negative for bruising or bleeding. Allergy: Negative for rash or hives.   Physical Exam: BP (!) 156/82    Pulse 86    Temp (!) 97.1 F (36.2 C) (Oral)    Ht 5' 2"  (1.575 m)    Wt 190 lb 6.4 oz (86.4 kg)    BMI 34.82 kg/m  General:   Alert and oriented. Pleasant and cooperative. Well-nourished and well-developed.  Eyes:  Without icterus, sclera clear and conjunctiva pink.  Ears:  Normal auditory acuity. Cardiovascular:  S1, S2 present without murmurs appreciated. Normal pulses noted. Extremities without clubbing or edema. Respiratory:  Clear to auscultation bilaterally. No wheezes, rales, or rhonchi. No distress.  Gastrointestinal:  +BS, soft, non-tender and non-distended. No HSM noted. No guarding or rebound. No masses appreciated.  Rectal:  Deferred  Musculoskalatal:  Symmetrical without gross deformities. Skin:  Intact without significant lesions or rashes. Neurologic:  Alert and oriented x4;  grossly normal neurologically. Psych:  Alert and cooperative. Normal mood and affect. Heme/Lymph/Immune: No excessive bruising noted.    02/27/2019 1:49 PM   Disclaimer: This note was dictated with voice recognition software. Similar sounding words can inadvertently be transcribed and may not be corrected upon review.

## 2019-02-27 NOTE — Assessment & Plan Note (Signed)
Persistent constipation.  When she takes Linzess every day she has diarrhea.  At her last visit we discussed reduced dosing which she has not tried.  She states she just wants to try different medication.  I will have her stop Linzess and start Amitiza 8 mcg twice daily on a full stomach.  Request progress for 1 to 2 weeks.  We can provide her samples today.  Follow-up in 3 months.  Call for any concerning or severe symptoms.

## 2019-02-27 NOTE — Assessment & Plan Note (Signed)
Persistent morning time nausea, again she feels this is due to her diabetes.  Zofran works well for her.  She currently has a prescription for 4 mg Zofran pills.  Her daughter takes 8 mg and she wants 8 mg as well.  I will send in a new prescription for 8 mg every 8 hours.  Discussed that we would have to decrease the frequency that she takes it if we increase the dose and she agrees.  Follow-up in 3 months.

## 2019-02-28 NOTE — Progress Notes (Signed)
CC'D TO PCP °

## 2019-03-05 ENCOUNTER — Telehealth: Payer: Self-pay | Admitting: Internal Medicine

## 2019-03-05 MED ORDER — LUBIPROSTONE 8 MCG PO CAPS: 8.0000 ug | ORAL_CAPSULE | Freq: Two times a day (BID) | ORAL | 3 refills | Status: DC

## 2019-03-05 NOTE — Telephone Encounter (Signed)
Forwarding to refill box.  

## 2019-03-05 NOTE — Telephone Encounter (Signed)
Pt called to say that the Amitiza samples worked well for her and she needs a prescription called into [facility].

## 2019-03-05 NOTE — Telephone Encounter (Signed)
Noted. Sent Amitiza 8 mcg po BID with food to [facility]. Mardelle Matte who recently saw patient.

## 2019-03-06 NOTE — Telephone Encounter (Signed)
Noted, glad it's helped her symptoms

## 2019-03-07 ENCOUNTER — Telehealth: Payer: Self-pay | Admitting: "Endocrinology

## 2019-03-07 MED ORDER — PEN NEEDLES 31G X 6 MM MISC: 1.0000 | Freq: Four times a day (QID) | 1 refills | Status: DC

## 2019-03-07 NOTE — Telephone Encounter (Signed)
[facility] called and said the patient needs a refill on pen tips.

## 2019-03-13 ENCOUNTER — Other Ambulatory Visit: Payer: Self-pay | Admitting: "Endocrinology

## 2019-05-21 ENCOUNTER — Other Ambulatory Visit: Payer: Self-pay

## 2019-05-22 MED ORDER — SUCRALFATE 1 G PO TABS: ORAL_TABLET | ORAL | 0 refills | Status: DC

## 2019-05-23 ENCOUNTER — Other Ambulatory Visit: Payer: Self-pay | Admitting: "Endocrinology

## 2019-05-28 ENCOUNTER — Ambulatory Visit: Payer: Medicare Other | Admitting: "Endocrinology

## 2019-05-28 ENCOUNTER — Encounter: Payer: Self-pay | Admitting: "Endocrinology

## 2019-05-30 ENCOUNTER — Ambulatory Visit: Payer: Medicare Other | Admitting: Nurse Practitioner

## 2019-05-31 ENCOUNTER — Other Ambulatory Visit: Payer: Self-pay | Admitting: "Endocrinology

## 2019-06-04 LAB — BASIC METABOLIC PANEL WITH GFR
BUN: 17 (ref 4–21)
Chloride: 95 — AB (ref 99–108)
Creatinine: 0.7 (ref 0.5–1.1)
Glucose: 227
Potassium: 3.8 (ref 3.4–5.3)
Sodium: 135 — AB (ref 137–147)

## 2019-06-04 LAB — HEPATIC FUNCTION PANEL
Alkaline Phosphatase: 111 (ref 25–125)
Bilirubin, Total: 0.2

## 2019-06-04 LAB — HEMOGLOBIN A1C: Hemoglobin A1C: 9

## 2019-06-04 LAB — COMPREHENSIVE METABOLIC PANEL: Calcium: 9.8 (ref 8.7–10.7)

## 2019-06-06 ENCOUNTER — Ambulatory Visit: Payer: Medicare Other | Admitting: Nurse Practitioner

## 2019-06-10 ENCOUNTER — Ambulatory Visit (INDEPENDENT_AMBULATORY_CARE_PROVIDER_SITE_OTHER): Payer: Medicare Other | Admitting: "Endocrinology

## 2019-06-10 ENCOUNTER — Other Ambulatory Visit: Payer: Self-pay

## 2019-06-10 ENCOUNTER — Encounter: Payer: Self-pay | Admitting: "Endocrinology

## 2019-06-10 DIAGNOSIS — IMO0002 Reserved for concepts with insufficient information to code with codable children: Secondary | ICD-10-CM

## 2019-06-10 DIAGNOSIS — Z91199 Patient's noncompliance with other medical treatment and regimen due to unspecified reason: Secondary | ICD-10-CM

## 2019-06-10 DIAGNOSIS — I10 Essential (primary) hypertension: Secondary | ICD-10-CM

## 2019-06-10 DIAGNOSIS — E118 Type 2 diabetes mellitus with unspecified complications: Secondary | ICD-10-CM | POA: Diagnosis not present

## 2019-06-10 DIAGNOSIS — Z9119 Patient's noncompliance with other medical treatment and regimen: Secondary | ICD-10-CM

## 2019-06-10 DIAGNOSIS — E782 Mixed hyperlipidemia: Secondary | ICD-10-CM | POA: Diagnosis not present

## 2019-06-10 DIAGNOSIS — Z794 Long term (current) use of insulin: Secondary | ICD-10-CM

## 2019-06-10 DIAGNOSIS — E1165 Type 2 diabetes mellitus with hyperglycemia: Secondary | ICD-10-CM

## 2019-06-10 MED ORDER — GLIPIZIDE ER 5 MG PO TB24: 5.0000 mg | ORAL_TABLET | Freq: Every day | ORAL | 3 refills | Status: DC

## 2019-06-10 NOTE — Progress Notes (Signed)
06/10/2019                                                    Endocrinology Telehealth Visit Follow up Note -During COVID -19 Pandemic  This visit type was conducted due to national recommendations for restrictions regarding the COVID-19 Pandemic  in an effort to limit this patient's exposure and mitigate transmission of the corona virus.  Due to her co-morbid illnesses, Carrie Turner is at  moderate to high risk for complications without adequate follow up.  This format is felt to be most appropriate for her at this time.  I connected with this patient on 06/10/2019   by telephone and verified that I am speaking with the correct person using two identifiers. Carrie Turner, [birth year]. she has verbally consented to this visit. All issues noted in this document were discussed and addressed. The format was not optimal for physical exam.     Subjective:    Patient ID: Carrie Turner, female    DOB: 1951-04-20. Patient is being engaged in telehealth via telephone in follow-up for management of diabetes requested by  Monico Blitz, MD  Past Medical History:  Diagnosis Date  . Anxiety   . COPD (chronic obstructive pulmonary disease) ([place])   . Depression   . Diabetes mellitus, type II ([place])   . GERD (gastroesophageal reflux disease)   . Glaucoma   . Hyperlipidemia   . Hypertension   . Migraines   . Neuropathy   . Seizures ([place])    seizure was from ETOH, "a long time ago", no med and no recurrance  . Vitamin D deficiency     Past Surgical History:  Procedure Laterality Date  . ABDOMINAL HYSTERECTOMY    . COLONOSCOPY  11/2015   Dr. Britta Mccreedy: sessile polyp removed (benign). advised repeat colonoscopy in 5 years.   . COLONOSCOPY WITH PROPOFOL N/A 10/26/2017   Procedure: COLONOSCOPY WITH PROPOFOL;  Surgeon: Daneil Dolin, MD;  [facility];  Service: Endoscopy;  Laterality: N/A;  12:45pm  . ESOPHAGEAL DILATION  10/26/2017   Procedure: ESOPHAGEAL DILATION;  Surgeon: Daneil Dolin, MD;  [facility];  Service: Endoscopy;;  . ESOPHAGOGASTRODUODENOSCOPY  11/2015   Dr. Britta Mccreedy: hiatal hernia  . ESOPHAGOGASTRODUODENOSCOPY (EGD) WITH PROPOFOL N/A 10/26/2017   Procedure: ESOPHAGOGASTRODUODENOSCOPY (EGD) WITH PROPOFOL;  Surgeon: Daneil Dolin, MD;  [facility];  Service: Endoscopy;  Laterality: N/A;  . KNEE ARTHROSCOPY Right   . ORIF ANKLE FRACTURE Right      Social History   Socioeconomic History  . Marital status: Single    Spouse name: Not on file  . Number of children: Not on file  . Years of education: Not on file  . Highest education level: Not on file  Occupational History  . Not on file  Social Needs  . Financial resource strain: Not on file  . Food insecurity:    Worry: Not on file    Inability: Not on file  . Transportation needs:    Medical: Not on file    Non-medical: Not on file  Tobacco Use  . Smoking status: Never Smoker  . Smokeless tobacco: Never Used  Substance and Sexual Activity  . Alcohol use: No  . Drug use: No  . Sexual activity: Never  Lifestyle  . Physical  activity:    Days per week: Not on file    Minutes per session: Not on file  . Stress: Not on file  Relationships  . Social connections:    Talks on phone: Not on file    Gets together: Not on file    Attends religious service: Not on file    Active member of club or organization: Not on file    Attends meetings of clubs or organizations: Not on file    Relationship status: Not on file  Other Topics Concern  . Not on file  Social History Narrative  . Not on file   Current Outpatient Medications on File Prior to Visit  Medication Sig Dispense Refill  . amLODipine (NORVASC) 2.5 MG tablet Take 2.5 mg by mouth daily.    . Blood Glucose Monitoring Suppl (ONETOUCH VERIO) w/Device KIT 1 each by Does not apply route 4 (four) times daily. E11.65 1 kit 0  . Cholecalciferol (VITAMIN D3) 2000 units TABS Take 2,000 Units by mouth daily.    . citalopram  (CELEXA) 40 MG tablet Take 40 mg by mouth daily.    . Continuous Blood Gluc Sensor (FREESTYLE LIBRE 14 DAY SENSOR) MISC Inject 1 each into the skin every 14 (fourteen) days. Use as directed. 2 each 2  . dexlansoprazole (DEXILANT) 60 MG capsule Take 1 capsule (60 mg total) by mouth daily. 90 capsule 3  . docusate sodium (COLACE) 100 MG capsule Take 100 mg by mouth 2 (two) times daily as needed for mild constipation.    . fenofibrate (TRICOR) 145 MG tablet TAKE 1 TABLET ONCE DAILY. 30 tablet 2  . Ferrous Gluconate (IRON 27 PO) Take 1 tablet by mouth daily.    Marland Kitchen gabapentin (NEURONTIN) 800 MG tablet Take 800 mg by mouth 3 (three) times daily.    . insulin lispro (HUMALOG) 100 UNIT/ML KwikPen INJECT 18-24 UNITS INTO THE SKIN 3 TIMES DAILY 30 mL 2  . Insulin Pen Needle (PEN NEEDLES) 31G X 6 MM MISC 1 each by Does not apply route 4 (four) times daily. 400 each 1  . latanoprost (XALATAN) 0.005 % ophthalmic solution Place 1 drop into both eyes at bedtime.    Marland Kitchen LEVEMIR FLEXTOUCH 100 UNIT/ML Pen INJECT 80 UNITS SUB-Q EACH DAY AT BEDTIME 60 mL 2  . LINZESS 72 MCG capsule Take 72 mcg by mouth as needed.   3  . lisinopril-hydrochlorothiazide (PRINZIDE,ZESTORETIC) 20-12.5 MG tablet Take 1 tablet by mouth 2 (two) times daily.    Marland Kitchen lubiprostone (AMITIZA) 8 MCG capsule Take 1 capsule (8 mcg total) by mouth 2 (two) times daily with a meal. 60 capsule 3  . ondansetron (ZOFRAN) 8 MG tablet Take 1 tablet (8 mg total) by mouth every 8 (eight) hours as needed for nausea or vomiting. 30 tablet 3  . ONETOUCH VERIO test strip USE 1 STRIP TO CHECK GLUCOSE 4 TIMES DAILY 150 each 5  . pravastatin (PRAVACHOL) 40 MG tablet Take 40 mg by mouth daily.    Marland Kitchen pyridOXINE (VITAMIN B-6) 100 MG tablet Take 100 mg by mouth daily.    . sucralfate (CARAFATE) 1 g tablet Take 1 gram orally before meals and at bedtime as needed. 60 tablet 0  . Suvorexant (BELSOMRA) 20 MG TABS Take 20 mg by mouth at bedtime.     . vitamin B-12 (CYANOCOBALAMIN)  500 MCG tablet Take 500 mcg by mouth daily.     No current facility-administered medications on file prior to visit.  No facility-administered encounter medications on file as of 10/16/2018.    Allergies  Allergen Reactions  . Sulfa Antibiotics      VACCINATION STATUS:  There is no immunization history on file for this patient.  Diabetes  She presents for her follow-up diabetic visit. She has type 2 diabetes mellitus. Onset time: She was diagnosed at approximate age of 79 years. Her disease course has been fluctuating, recently showing some improvement.  She reports her glycemic profile which show improvement with no hypoglycemia.  -Pertinent negatives for hypoglycemia include no confusion, headaches, pallor or seizures. Associated symptoms include fatigue, polydipsia and polyuria. Pertinent negatives for diabetes include no blurred vision, no chest pain and no polyphagia. There are no hypoglycemic complications.   There are no diabetic complications. Risk factors for coronary artery disease include diabetes mellitus, dyslipidemia, obesity and sedentary lifestyle.  -Her previsit labs show A1c of 9%, slowly improving from 11%.  He denies hypoglycemia, average blood glucose readings between 185 and 200 mg per DL.    She is following a generally unhealthy diet. When asked about meal planning, she reported none. She has had a previous visit with a dietitian (She will see the dietitian today.). She never participates in exercise.   ) An ACE inhibitor/angiotensin II receptor blocker is not being taken. Eye exam is current.  Hyperlipidemia  This is a chronic problem. The current episode started more than 1 year ago. Recent lipid tests were reviewed and are high. Exacerbating diseases include diabetes and obesity. Pertinent negatives include no chest pain, myalgias or shortness of breath. She is currently on no antihyperlipidemic treatment. Compliance problems include adherence to diet and  adherence to exercise.  Risk factors for coronary artery disease include diabetes mellitus, dyslipidemia, hypertension, obesity and a sedentary lifestyle.     Objective:    Recent Results (from the past 2160 hour(s))  Basic metabolic panel     Status: Abnormal   Collection Time: 06/04/19 12:00 AM  Result Value Ref Range   Glucose 227    BUN 17 4 - 21   Creatinine 0.7 0.5 - 1.1   Potassium 3.8 3.4 - 5.3   Sodium 135 (A) 137 - 147   Chloride 95 (A) 99 - 108  Comprehensive metabolic panel     Status: None   Collection Time: 06/04/19 12:00 AM  Result Value Ref Range   Calcium 9.8 8.7 - 10.7  Hepatic function panel     Status: None   Collection Time: 06/04/19 12:00 AM  Result Value Ref Range   Alkaline Phosphatase 111 25 - 125   Bilirubin, Total 0.2   Hemoglobin A1c     Status: None   Collection Time: 06/04/19 12:00 AM  Result Value Ref Range   Hemoglobin A1C 9.0     Labs from June 18, 2018 showed BUN 8 creatinine 051, A1c 11% Labs from January 24, 2018 showed BUN 9,  Creatinine 0.58, A1c 10.7% September 20, 2017 A1c 10.5%    Assessment & Plan:   1. Uncontrolled type 2 diabetes mellitus with complication, with long-term current use of insulin ([place])  - Patient has currently uncontrolled symptomatic type 2 DM since  68 years of age. -She reports improving glycemic profile, previsit A1c 9% progressively improving from 11%.   - Recent labs are reviewed, showing normal renal function.   Her diabetes is complicated by noncompliance/nonadherence, obesity and sedentary life and patient remains at a high risk for more acute and chronic complications of diabetes which include  CAD, CVA, CKD, retinopathy, and neuropathy. These are all discussed in detail with the patient.  - she  admits there is a room for improvement in her diet and drink choices. -  Suggestion is made for her to avoid simple carbohydrates  from her diet including Cakes, Sweet Desserts / Pastries, Ice Cream, Soda (diet  and regular), Sweet Tea, Candies, Chips, Cookies, Sweet Pastries,  Store Bought Juices, Alcohol in Excess of  1-2 drinks a day, Artificial Sweeteners, Coffee Creamer, and "Sugar-free" Products. This will help patient to have stable blood glucose profile and potentially avoid unintended weight gain.   - I have approached patient with the following individualized plan to manage diabetes and patient agrees:   -she is struggling to achieve control of diabetes, likely deals with moderate cognitive deficit.    -She will continue to need intensive treatment with basal/bolus insulin in order for her to achieve control of diabetes to target.     -She has questionable compliance and cognitive ability, her treatment will be adjusted slowly.   -She is advised to continue Levemir  80 units nightly, continue Humalog 18 units 3 times daily  before meals for pre-meal BG readings of 90 -152m/dl, plus patient specific correction dose for unexpected hyperglycemia above 1536mdl, associated with strict monitoring of glucose 4 times a day-before meals and at bedtime. - Patient is warned not to take insulin without proper monitoring per orders.  -Patient is encouraged to call clinic for blood glucose levels less than 70 or above 300 mg /dl.  She did not tolerate retrial of low-dose metformin.  She is advised to discontinue.    -She has tolerated and benefited from the low-dose glipizide.  Advised her to increase her glipizide to 5 mg p.o. daily at breakfast.    -She is not a reliable candidate for incretin therapy.   2) Lipids/HPL: She has severely uncontrolled hypertriglyceridemia, LDL not calculated.  She is advised to continue pravastatin 40 mg p.o. nightly,  advised to cut back on butter and fried food.  Fenofibrate will be added to her regimen to address hypertriglyceridemia.    3) hypertension: she is advised to home monitor blood pressure and report if > 140/90 on 2 separate readings. She is advised to  continue follow-up lisinopril/hydrochlorothiazide 20/12.5 mg daily.   She is advised to continue follow-up closely with Dr. ShManuella Ghazior primary care needs.  - Patient Care Time Today:  25 min, of which >50% was spent in  counseling and the rest reviewing her  current and  previous labs/studies, previous treatments, her blood glucose readings, and medications' doses and developing a plan for long-term care based on the latest recommendations for standards of care.   EvOrlena Sheldonarticipated in the discussions, expressed understanding, and voiced agreement with the above plans.  All questions were answered to her satisfaction. she is encouraged to contact clinic should she have any questions or concerns prior to her return visit.     Follow up plan: -Return in 3 months with labs, logs, and meter. GeGlade LloydMD Phone: [phone]: [phone]-  This note was partially dictated with voice recognition software. Similar sounding words can be transcribed inadequately or may not  be corrected upon review.  10/16/2018, 1:34 PM

## 2019-06-18 ENCOUNTER — Telehealth: Payer: Self-pay | Admitting: Internal Medicine

## 2019-06-18 DIAGNOSIS — K5900 Constipation, unspecified: Secondary | ICD-10-CM

## 2019-06-18 DIAGNOSIS — R110 Nausea: Secondary | ICD-10-CM

## 2019-06-18 MED ORDER — ONDANSETRON HCL 8 MG PO TABS: 8.0000 mg | ORAL_TABLET | Freq: Three times a day (TID) | ORAL | 3 refills | Status: DC | PRN

## 2019-06-18 NOTE — Addendum Note (Signed)
Addended by: Mahala Menghini on: 06/18/2019 04:15 PM   Modules accepted: Orders

## 2019-06-18 NOTE — Telephone Encounter (Signed)
[phone]    ondansepron ?-she did not know how to pronounce     Patient uses [facility] in [place]

## 2019-06-18 NOTE — Telephone Encounter (Signed)
Pt would like a refill of Zofran sent to her pharmacy.

## 2019-07-03 ENCOUNTER — Encounter: Payer: Self-pay | Admitting: Internal Medicine

## 2019-07-03 ENCOUNTER — Telehealth: Payer: Self-pay | Admitting: Internal Medicine

## 2019-07-03 ENCOUNTER — Ambulatory Visit: Payer: Medicare Other | Admitting: Nurse Practitioner

## 2019-07-03 NOTE — Progress Notes (Deleted)
Referring Provider: Monico Blitz, MD Primary Care Physician:  Monico Blitz, MD Primary GI:  Dr. Gala Romney  No chief complaint on file.   HPI:   Carrie Turner is a 68 y.o. female who presents for follow-up on nausea.  The patient was last seen in our office 02/27/2019 for constipation and nausea without vomiting.  Colonoscopy up-to-date 2019 with a single tubular adenoma and recommended 5-year repeat in 2024.  Random colon biopsies negative.  EGD at same time with hiatal hernia otherwise unremarkable.  Persistent nausea and vomiting felt likely due to uncontrolled diabetes with recent hemoglobin A1c of 11.  Alkaline phosphatase is elevated at 157 on 02/21/2018 with a GGT of 40 and recommended recheck LFTs in 6 months.  Previously upper GI symptoms responded well to Dexilant, Zofran helps with nausea.  Intermittent Linzess if constipated with associated nausea and works better she takes at night.  Her Linzess dose was decreased to 72 mcg due to intermittent diarrhea.  At her last visit noted morning time nausea and thinks is from her diabetes, denies vomiting.  Zofran is effective especially at the 8 mg dose.  Did not reduce the dose of her Linzess as previously recommended.  If she takes it every day she will have diarrhea and subsequently she does not take it if she does not have constipation.  Has some early satiety.  No other overt GI symptoms.  Uses peppermint oil and ginger chews for nausea as well.  Recommended stop Linzess, start Amitiza 8 mcg twice daily, progress report in 1 to 2 weeks, refill Zofran, follow-up in 3 months.  The patient called our office 03/05/2019 to note that 8 mcg Amitiza working well and requested a refill.  This was sent to her pharmacy.  Of note her last hemoglobin A1c about a month ago was 9.0 which is improved from 11.0.  Today she states   Past Medical History:  Diagnosis Date   Anxiety    COPD (chronic obstructive pulmonary disease) ([place])    Depression      Diabetes mellitus, type II ([place])    GERD (gastroesophageal reflux disease)    Glaucoma    Hyperlipidemia    Hypertension    Migraines    Neuropathy    Seizures ([facility])    seizure was from ETOH, "a long time ago", no med and no recurrance   Vitamin D deficiency     Past Surgical History:  Procedure Laterality Date   ABDOMINAL HYSTERECTOMY     COLONOSCOPY  11/2015   Dr. Britta Mccreedy: sessile polyp removed (benign). advised repeat colonoscopy in 5 years.    COLONOSCOPY WITH PROPOFOL N/A 10/26/2017   Procedure: COLONOSCOPY WITH PROPOFOL;  Surgeon: Daneil Dolin, MD;  [facility];  Service: Endoscopy;  Laterality: N/A;  12:45pm   ESOPHAGEAL DILATION  10/26/2017   Procedure: ESOPHAGEAL DILATION;  Surgeon: Daneil Dolin, MD;  [facility];  Service: Endoscopy;;   ESOPHAGOGASTRODUODENOSCOPY  11/2015   Dr. Britta Mccreedy: hiatal hernia   ESOPHAGOGASTRODUODENOSCOPY (EGD) WITH PROPOFOL N/A 10/26/2017   Procedure: ESOPHAGOGASTRODUODENOSCOPY (EGD) WITH PROPOFOL;  Surgeon: Daneil Dolin, MD;  [facility];  Service: Endoscopy;  Laterality: N/A;   KNEE ARTHROSCOPY Right    ORIF ANKLE FRACTURE Right     Current Outpatient Medications  Medication Sig Dispense Refill   amLODipine (NORVASC) 2.5 MG tablet Take 2.5 mg by mouth daily.     Blood Glucose Monitoring Suppl (ONETOUCH VERIO) w/Device KIT 1 each  by Does not apply route 4 (four) times daily. E11.65 1 kit 0   Cholecalciferol (VITAMIN D3) 2000 units TABS Take 2,000 Units by mouth daily.     citalopram (CELEXA) 40 MG tablet Take 40 mg by mouth daily.     Continuous Blood Gluc Sensor (FREESTYLE LIBRE 14 DAY SENSOR) MISC Inject 1 each into the skin every 14 (fourteen) days. Use as directed. 2 each 2   dexlansoprazole (DEXILANT) 60 MG capsule Take 1 capsule (60 mg total) by mouth daily. 90 capsule 3   docusate sodium (COLACE) 100 MG capsule Take 100 mg by mouth 2 (two) times daily as needed for mild  constipation.     fenofibrate (TRICOR) 145 MG tablet TAKE 1 TABLET ONCE DAILY. 30 tablet 2   Ferrous Gluconate (IRON 27 PO) Take 1 tablet by mouth daily.     gabapentin (NEURONTIN) 800 MG tablet Take 800 mg by mouth 3 (three) times daily.     glipiZIDE (GLUCOTROL XL) 5 MG 24 hr tablet Take 1 tablet (5 mg total) by mouth daily with breakfast. 30 tablet 3   insulin lispro (HUMALOG) 100 UNIT/ML KwikPen INJECT 18-24 UNITS INTO THE SKIN 3 TIMES DAILY 30 mL 2   Insulin Pen Needle (PEN NEEDLES) 31G X 6 MM MISC 1 each by Does not apply route 4 (four) times daily. 400 each 1   latanoprost (XALATAN) 0.005 % ophthalmic solution Place 1 drop into both eyes at bedtime.     LEVEMIR FLEXTOUCH 100 UNIT/ML Pen INJECT 80 UNITS SUB-Q EACH DAY AT BEDTIME 60 mL 2   LINZESS 72 MCG capsule Take 72 mcg by mouth as needed.   3   lisinopril-hydrochlorothiazide (PRINZIDE,ZESTORETIC) 20-12.5 MG tablet Take 1 tablet by mouth 2 (two) times daily.     lubiprostone (AMITIZA) 8 MCG capsule Take 1 capsule (8 mcg total) by mouth 2 (two) times daily with a meal. 60 capsule 3   ondansetron (ZOFRAN) 8 MG tablet Take 1 tablet (8 mg total) by mouth every 8 (eight) hours as needed for nausea or vomiting. 30 tablet 3   ONETOUCH VERIO test strip USE 1 STRIP TO CHECK GLUCOSE 4 TIMES DAILY 150 each 5   pravastatin (PRAVACHOL) 40 MG tablet Take 40 mg by mouth daily.     pyridOXINE (VITAMIN B-6) 100 MG tablet Take 100 mg by mouth daily.     sucralfate (CARAFATE) 1 g tablet Take 1 gram orally before meals and at bedtime as needed. 60 tablet 0   Suvorexant (BELSOMRA) 20 MG TABS Take 20 mg by mouth at bedtime.      vitamin B-12 (CYANOCOBALAMIN) 500 MCG tablet Take 500 mcg by mouth daily.     No current facility-administered medications for this visit.     Allergies as of 07/03/2019 - Review Complete 06/10/2019  Allergen Reaction Noted   Sulfa antibiotics  03/04/2016    Family History  Problem Relation Age of Onset    Hypertension Mother    Colon cancer Mother        diagnosed early 79s   Cerebral aneurysm Mother    Alcohol abuse Father    Pancreatic cancer Sister        deceased in 26s   Colon cancer Sister        diagnosed 42 y/o   Stroke Brother    Cancer Maternal Grandmother    Cancer Maternal Grandfather    Cancer Paternal Grandmother    Other Paternal Grandfather    Other Brother  Social History   Socioeconomic History   Marital status: Single    Spouse name: Not on file   Number of children: Not on file   Years of education: Not on file   Highest education level: Not on file  Occupational History   Not on file  Social Needs   Financial resource strain: Not on file   Food insecurity    Worry: Not on file    Inability: Not on file   Transportation needs    Medical: Not on file    Non-medical: Not on file  Tobacco Use   Smoking status: Never Smoker   Smokeless tobacco: Never Used  Substance and Sexual Activity   Alcohol use: No   Drug use: No   Sexual activity: Never  Lifestyle   Physical activity    Days per week: Not on file    Minutes per session: Not on file   Stress: Not on file  Relationships   Social connections    Talks on phone: Not on file    Gets together: Not on file    Attends religious service: Not on file    Active member of club or organization: Not on file    Attends meetings of clubs or organizations: Not on file    Relationship status: Not on file  Other Topics Concern   Not on file  Social History Narrative   Not on file    Review of Systems: General: Negative for anorexia, weight loss, fever, chills, fatigue, weakness. Eyes: Negative for vision changes.  ENT: Negative for hoarseness, difficulty swallowing , nasal congestion. CV: Negative for chest pain, angina, palpitations, dyspnea on exertion, peripheral edema.  Respiratory: Negative for dyspnea at rest, dyspnea on exertion, cough, sputum, wheezing.  GI:  See history of present illness. GU:  Negative for dysuria, hematuria, urinary incontinence, urinary frequency, nocturnal urination.  MS: Negative for joint pain, low back pain.  Derm: Negative for rash or itching.  Neuro: Negative for weakness, abnormal sensation, seizure, frequent headaches, memory loss, confusion.  Psych: Negative for anxiety, depression, suicidal ideation, hallucinations.  Endo: Negative for unusual weight change.  Heme: Negative for bruising or bleeding. Allergy: Negative for rash or hives.   Physical Exam: There were no vitals taken for this visit. General:   Alert and oriented. Pleasant and cooperative. Well-nourished and well-developed.  Head:  Normocephalic and atraumatic. Eyes:  Without icterus, sclera clear and conjunctiva pink.  Ears:  Normal auditory acuity. Mouth:  No deformity or lesions, oral mucosa pink.  Throat/Neck:  Supple, without mass or thyromegaly. Cardiovascular:  S1, S2 present without murmurs appreciated. Normal pulses noted. Extremities without clubbing or edema. Respiratory:  Clear to auscultation bilaterally. No wheezes, rales, or rhonchi. No distress.  Gastrointestinal:  +BS, soft, non-tender and non-distended. No HSM noted. No guarding or rebound. No masses appreciated.  Rectal:  Deferred  Musculoskalatal:  Symmetrical without gross deformities. Normal posture. Skin:  Intact without significant lesions or rashes. Neurologic:  Alert and oriented x4;  grossly normal neurologically. Psych:  Alert and cooperative. Normal mood and affect. Heme/Lymph/Immune: No significant cervical adenopathy. No excessive bruising noted.    07/03/2019 1:18 PM   Disclaimer: This note was dictated with voice recognition software. Similar sounding words can inadvertently be transcribed and may not be corrected upon review.

## 2019-07-03 NOTE — Telephone Encounter (Signed)
PATIENT WAS A NO SHOW AND LETTER SENT  °

## 2019-07-08 ENCOUNTER — Other Ambulatory Visit: Payer: Self-pay | Admitting: "Endocrinology

## 2019-07-14 NOTE — Progress Notes (Deleted)
Referring Provider: Monico Blitz, MD Primary Care Physician:  Monico Blitz, MD Primary GI Physician: Dr. Gala Romney  No chief complaint on file.   HPI:   Carrie Turner is a 68 y.o. female presenting today for follow-up.  History of GERD, constipation, chronic nausea, historically elevated alk phos with normal GGT and autoimmune markers but normalized in November 2020 EGD April 2019 with reflux esophagitis s/p dilation, small hiatal hernia, otherwise normal.  Benign small bowel biopsies. TCS April 2019 with 1 tubular adenoma, nonbleeding internal hemorrhoids, otherwise normal.  Colonoscopy in 2024.   Patient was last seen in our office on 02/27/19 for constipation and nausea without vomiting.  She reported morning nausea and thought it was secondary to diabetes.  Zofran helping but requested increase in strength to 8 mg.  Could not take Linzess daily due to causing diarrhea.  Without Linzess, she has constipation.  Reported early satiety.  Plans at that time included stopping Linzess and try Amitiza 8 mcg twice daily and increasing Zofran to 8 mg every 8 hours as needed, follow-up in 3 months.    Patient called on 03/05/2019 and reported Amitiza worked well and requested a refill.   Today:    Educate on gastroparesis diet. Can discuss with nutritionists as well.   Past Medical History:  Diagnosis Date  . Anxiety   . COPD (chronic obstructive pulmonary disease) ([place])   . Depression   . Diabetes mellitus, type II ([place])   . GERD (gastroesophageal reflux disease)   . Glaucoma   . Hyperlipidemia   . Hypertension   . Migraines   . Neuropathy   . Seizures ([place])    seizure was from ETOH, "a long time ago", no med and no recurrance  . Vitamin D deficiency     Past Surgical History:  Procedure Laterality Date  . ABDOMINAL HYSTERECTOMY    . COLONOSCOPY  11/2015   Dr. Britta Mccreedy: sessile polyp removed (benign). advised repeat colonoscopy in 5 years.   . COLONOSCOPY WITH PROPOFOL N/A 10/26/2017    Procedure: COLONOSCOPY WITH PROPOFOL;  Surgeon: Daneil Dolin, MD;  [facility];  Service: Endoscopy;  Laterality: N/A;  12:45pm  . ESOPHAGEAL DILATION  10/26/2017   Procedure: ESOPHAGEAL DILATION;  Surgeon: Daneil Dolin, MD;  Location: [facility];  Service: Endoscopy;;  . ESOPHAGOGASTRODUODENOSCOPY  11/2015   Dr. Britta Mccreedy: hiatal hernia  . ESOPHAGOGASTRODUODENOSCOPY (EGD) WITH PROPOFOL N/A 10/26/2017   Procedure: ESOPHAGOGASTRODUODENOSCOPY (EGD) WITH PROPOFOL;  Surgeon: Daneil Dolin, MD;  [facility];  Service: Endoscopy;  Laterality: N/A;  . KNEE ARTHROSCOPY Right   . ORIF ANKLE FRACTURE Right     Current Outpatient Medications  Medication Sig Dispense Refill  . acetaminophen (TYLENOL) 500 MG tablet Take 1,000 mg by mouth every 6 (six) hours as needed (for pain/headaches.).    Marland Kitchen albuterol (VENTOLIN HFA) 108 (90 Base) MCG/ACT inhaler Inhale 1-2 puffs into the lungs every 6 (six) hours as needed for wheezing or shortness of breath.    Marland Kitchen amLODipine (NORVASC) 2.5 MG tablet Take 2.5 mg by mouth daily.    Marland Kitchen aspirin EC 81 MG tablet Take 81 mg by mouth daily.    . Blood Glucose Monitoring Suppl (ONETOUCH VERIO) w/Device KIT 1 each by Does not apply route 4 (four) times daily. E11.65 1 kit 0  . brimonidine (ALPHAGAN P) 0.1 % SOLN Place 1 drop into both eyes 2 (two) times daily.    . Calcium Carb-Cholecalciferol (CALCIUM 500 +  D3 PO) Take 1 tablet by mouth daily.    . Cholecalciferol (VITAMIN D3) 2000 units TABS Take 2,000 Units by mouth daily.    . citalopram (CELEXA) 40 MG tablet Take 40 mg by mouth daily.    . Continuous Blood Gluc Sensor (FREESTYLE LIBRE 14 DAY SENSOR) MISC Inject 1 each into the skin every 14 (fourteen) days. Use as directed. 2 each 2  . dexlansoprazole (DEXILANT) 60 MG capsule Take 1 capsule (60 mg total) by mouth daily. 90 capsule 3  . docusate sodium (COLACE) 100 MG capsule Take 100 mg by mouth 2 (two) times daily as needed for mild  constipation.    . eszopiclone (LUNESTA) 2 MG TABS tablet Take 2 mg by mouth at bedtime as needed for sleep. Take immediately before bedtime    . fenofibrate (TRICOR) 145 MG tablet TAKE 1 TABLET ONCE DAILY. (Patient taking differently: Take 145 mg by mouth daily. ) 30 tablet 2  . gabapentin (NEURONTIN) 800 MG tablet Take 800 mg by mouth 4 (four) times daily.     Marland Kitchen glipiZIDE (GLUCOTROL XL) 2.5 MG 24 hr tablet Take 2 tablets (5 mg total) by mouth daily with breakfast. 30 tablet 0  . glipiZIDE (GLUCOTROL XL) 2.5 MG 24 hr tablet Take 2.5 mg by mouth daily with breakfast.    . glipiZIDE (GLUCOTROL XL) 5 MG 24 hr tablet Take 1 tablet (5 mg total) by mouth daily with breakfast. (Patient not taking: Reported on 07/08/2019) 30 tablet 3  . insulin lispro (HUMALOG) 100 UNIT/ML KwikPen INJECT 18-24 UNITS INTO THE SKIN 3 TIMES DAILY (Patient taking differently: Inject 19-20 Units into the skin 3 (three) times daily. ) 30 mL 2  . Insulin Pen Needle (PEN NEEDLES) 31G X 6 MM MISC 1 each by Does not apply route 4 (four) times daily. 400 each 1  . latanoprost (XALATAN) 0.005 % ophthalmic solution Place 1 drop into both eyes at bedtime.    Marland Kitchen LEVEMIR FLEXTOUCH 100 UNIT/ML Pen INJECT 80 UNITS SUB-Q EACH DAY AT BEDTIME (Patient taking differently: Inject 80 Units into the skin at bedtime. ) 60 mL 2  . lisinopril-hydrochlorothiazide (PRINZIDE,ZESTORETIC) 20-12.5 MG tablet Take 2 tablets by mouth daily.     Marland Kitchen lubiprostone (AMITIZA) 8 MCG capsule Take 1 capsule (8 mcg total) by mouth 2 (two) times daily with a meal. 60 capsule 3  . ondansetron (ZOFRAN) 8 MG tablet Take 1 tablet (8 mg total) by mouth every 8 (eight) hours as needed for nausea or vomiting. 30 tablet 3  . ONETOUCH VERIO test strip USE 1 STRIP TO CHECK GLUCOSE 4 TIMES DAILY 150 each 5  . pravastatin (PRAVACHOL) 40 MG tablet Take 40 mg by mouth daily.    Marland Kitchen pyridOXINE (VITAMIN B-6) 100 MG tablet Take 100 mg by mouth daily.    . sucralfate (CARAFATE) 1 g tablet  Take 1 gram orally before meals and at bedtime as needed. (Patient taking differently: Take 1 g by mouth 4 (four) times daily as needed (crush and mix with water as needed for upset stomach). Take 1 gram orally before meals and at bedtime as needed.) 60 tablet 0  . vitamin B-12 (CYANOCOBALAMIN) 500 MCG tablet Take 500 mcg by mouth daily.     No current facility-administered medications for this visit.    Allergies as of 07/15/2019 - Review Complete 07/08/2019  Allergen Reaction Noted  . Sulfa antibiotics Other (See Comments) 03/04/2016    Family History  Problem Relation Age of Onset  .  Hypertension Mother   . Colon cancer Mother        diagnosed early 25s  . Cerebral aneurysm Mother   . Alcohol abuse Father   . Pancreatic cancer Sister        deceased in 34s  . Colon cancer Sister        diagnosed 35 y/o  . Stroke Brother   . Cancer Maternal Grandmother   . Cancer Maternal Grandfather   . Cancer Paternal Grandmother   . Other Paternal Grandfather   . Other Brother     Social History   Socioeconomic History  . Marital status: Single    Spouse name: Not on file  . Number of children: Not on file  . Years of education: Not on file  . Highest education level: Not on file  Occupational History  . Not on file  Tobacco Use  . Smoking status: Never Smoker  . Smokeless tobacco: Never Used  Substance and Sexual Activity  . Alcohol use: No  . Drug use: No  . Sexual activity: Never  Other Topics Concern  . Not on file  Social History Narrative  . Not on file   Social Determinants of Health   Financial Resource Strain:   . Difficulty of Paying Living Expenses: Not on file  Food Insecurity:   . Worried About Charity fundraiser in the Last Year: Not on file  . Ran Out of Food in the Last Year: Not on file  Transportation Needs:   . Lack of Transportation (Medical): Not on file  . Lack of Transportation (Non-Medical): Not on file  Physical Activity:   . Days of  Exercise per Week: Not on file  . Minutes of Exercise per Session: Not on file  Stress:   . Feeling of Stress : Not on file  Social Connections:   . Frequency of Communication with Friends and Family: Not on file  . Frequency of Social Gatherings with Friends and Family: Not on file  . Attends Religious Services: Not on file  . Active Member of Clubs or Organizations: Not on file  . Attends Archivist Meetings: Not on file  . Marital Status: Not on file    Review of Systems: Gen: Denies fever, chills, anorexia. Denies fatigue, weakness, weight loss.  CV: Denies chest pain, palpitations, syncope, peripheral edema, and claudication. Resp: Denies dyspnea at rest, cough, wheezing, coughing up blood, and pleurisy. GI: Denies vomiting blood, jaundice, and fecal incontinence.   Denies dysphagia or odynophagia. Derm: Denies rash, itching, dry skin Psych: Denies depression, anxiety, memory loss, confusion. No homicidal or suicidal ideation.  Heme: Denies bruising, bleeding, and enlarged lymph nodes.  Physical Exam: There were no vitals taken for this visit. General:   Alert and oriented. No distress noted. Pleasant and cooperative.  Head:  Normocephalic and atraumatic. Eyes:  Conjuctiva clear without scleral icterus. Mouth:  Oral mucosa pink and moist. Good dentition. No lesions. Heart:  S1, S2 present without murmurs appreciated. Lungs:  Clear to auscultation bilaterally. No wheezes, rales, or rhonchi. No distress.  Abdomen:  +BS, soft, non-tender and non-distended. No rebound or guarding. No HSM or masses noted. Msk:  Symmetrical without gross deformities. Normal posture. Extremities:  Without edema. Neurologic:  Alert and  oriented x4 Psych:  Alert and cooperative. Normal mood and affect.

## 2019-07-15 ENCOUNTER — Ambulatory Visit: Payer: Medicare Other | Admitting: Gastroenterology

## 2019-07-16 ENCOUNTER — Other Ambulatory Visit: Payer: Self-pay | Admitting: Gastroenterology

## 2019-07-16 MED ORDER — SUCRALFATE 1 G PO TABS: 1.0000 g | ORAL_TABLET | Freq: Four times a day (QID) | ORAL | 0 refills | Status: DC | PRN

## 2019-07-16 NOTE — H&P (Signed)
Surgical History & Physical  Patient Name: Carrie Turner DOB: April 03, 1951  Surgery: Cataract extraction with intraocular lens implant phacoemulsification and insertion of aqueous drainage device; Left Eye  Surgeon: Baruch Goldmann MD Surgery Date:  07/24/2019 Pre-Op Date:  07/04/2019  HPI: A 22 Yr. old female patient 1. 1. The patient complains of difficulty when viewing TV, reading closed caption, news scrolls on TV, which began many years ago. Both eyes are affected. The episode is gradual. Symptoms occur when the patient is driving, inside and outside. The complaint is associated with glare. Pt states she does not drive at night due to glare. This This is negatively affecting the patient's quality of life. Hx of glaucoma, using Alphagan BID OU, Latanoprost QHS OU. Pt did not put her drops this morning. HPI Completed by Dr. Baruch Goldmann  Medical History: Glaucoma Cataracts Diabetes High Blood Pressure LDL  Review of Systems Negative Allergic/Immunologic Negative Cardiovascular Negative Constitutional Negative Ear, Nose, Mouth & Throat Negative Endocrine Negative Gastrointestinal Negative Genitourinary Negative Hemotologic/Lymphatic Negative Integumentary Negative Musculoskeletal Negative Neurological Negative Psychiatry Negative Respiratory  Social   Former smoker  Medication Alphagan P, Latanoprost,  Lisinopril, Amlodipine Besylate, NovoLog, Acid reflux,   Sx/Procedures Foot Surgery, Knee Surgery, Hysterectomy,   Drug Allergies   NKDA  History & Physical: Heent:  Cataract and Glaucoma, Left eye NECK: supple without bruits LUNGS: lungs clear to auscultation CV: regular rate and rhythm Abdomen: soft and non-tender  Impression & Plan: Assessment: 1.  COMBINED FORMS AGE RELATED CATARACT; Both Eyes (H25.813) 2.  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  ASTIGMATISM, REGULAR; Both Eyes (H52.223)  Plan: 1.  Cataract accounts for the  patient's decreased vision. This visual impairment is not correctable with a tolerable change in glasses or contact lenses. Cataract surgery with an implantation of a new lens should significantly improve the visual and functional status of the patient. Discussed all risks, benefits, alternatives, and potential complications. Discussed the procedures and recovery. Patient desires to have surgery. A-scan ordered and performed today for intra-ocular lens calculations. The surgery will be performed in order to improve vision for driving, reading, and for eye examinations. Recommend phacoemulsification with intra-ocular lens. left eye worse - first. Dilates well - shugarcaine by protocol. Recommend iSent inject to control pressure in setting of glaucoma. 2.  Currently on Alphagan and Latanoprost. IOPs above normal and above goal today. OCT rNFL OD - thinning. Unable OS. recommend iStent inject W iStent inject both eyes at the time of cataract surgery to help lower eye pressure given elevated IOP despite regular topical glaucoma drop use. Detailed discussion about glaucoma today including importance of maintaining good follow up and following treatment plan, and the possibility of irreversible blindness as part of this disease process. 3.  Declines toric lens.

## 2019-07-16 NOTE — Addendum Note (Signed)
Addended by: Mahala Menghini on: 07/16/2019 06:22 PM   Modules accepted: Orders

## 2019-07-22 ENCOUNTER — Encounter (HOSPITAL_COMMUNITY)
Admission: RE | Admit: 2019-07-22 | Discharge: 2019-07-22 | Disposition: A | Discharge status: Home / Self Care | Payer: Medicare Other | Source: Ambulatory Visit | Attending: Ophthalmology | Admitting: Ophthalmology

## 2019-07-22 ENCOUNTER — Other Ambulatory Visit (HOSPITAL_COMMUNITY)
Admission: RE | Admit: 2019-07-22 | Discharge: 2019-07-22 | Disposition: A | Discharge status: Home / Self Care | Payer: Medicare Other | Source: Ambulatory Visit | Attending: Ophthalmology | Admitting: Ophthalmology

## 2019-07-22 ENCOUNTER — Other Ambulatory Visit: Payer: Self-pay

## 2019-07-22 DIAGNOSIS — Z20828 Contact with and (suspected) exposure to other viral communicable diseases: Secondary | ICD-10-CM | POA: Diagnosis not present

## 2019-07-22 DIAGNOSIS — Z01812 Encounter for preprocedural laboratory examination: Secondary | ICD-10-CM | POA: Insufficient documentation

## 2019-07-23 LAB — SARS CORONAVIRUS 2 (TAT 6-24 HRS): SARS Coronavirus 2: NEGATIVE

## 2019-07-24 ENCOUNTER — Ambulatory Visit (HOSPITAL_COMMUNITY)
Admission: RE | Admit: 2019-07-24 | Discharge: 2019-07-24 | Disposition: A | Discharge status: Home / Self Care | Payer: Medicare Other | Attending: Ophthalmology | Admitting: Ophthalmology

## 2019-07-24 ENCOUNTER — Encounter (HOSPITAL_COMMUNITY): Payer: Self-pay | Admitting: Ophthalmology

## 2019-07-24 ENCOUNTER — Ambulatory Visit (HOSPITAL_COMMUNITY): Payer: Medicare Other | Admitting: Anesthesiology

## 2019-07-24 ENCOUNTER — Encounter (HOSPITAL_COMMUNITY)
Admission: RE | Disposition: A | Discharge status: Home / Self Care | Payer: Self-pay | Source: Home / Self Care | Attending: Ophthalmology

## 2019-07-24 ENCOUNTER — Other Ambulatory Visit: Payer: Self-pay | Admitting: "Endocrinology

## 2019-07-24 DIAGNOSIS — H401121 Primary open-angle glaucoma, left eye, mild stage: Secondary | ICD-10-CM | POA: Insufficient documentation

## 2019-07-24 DIAGNOSIS — Z794 Long term (current) use of insulin: Secondary | ICD-10-CM | POA: Diagnosis not present

## 2019-07-24 DIAGNOSIS — H25812 Combined forms of age-related cataract, left eye: Secondary | ICD-10-CM | POA: Insufficient documentation

## 2019-07-24 DIAGNOSIS — Z79899 Other long term (current) drug therapy: Secondary | ICD-10-CM | POA: Diagnosis not present

## 2019-07-24 DIAGNOSIS — I10 Essential (primary) hypertension: Secondary | ICD-10-CM | POA: Diagnosis not present

## 2019-07-24 DIAGNOSIS — E1136 Type 2 diabetes mellitus with diabetic cataract: Secondary | ICD-10-CM | POA: Diagnosis not present

## 2019-07-24 DIAGNOSIS — Z87891 Personal history of nicotine dependence: Secondary | ICD-10-CM | POA: Diagnosis not present

## 2019-07-24 DIAGNOSIS — H401112 Primary open-angle glaucoma, right eye, moderate stage: Secondary | ICD-10-CM | POA: Diagnosis not present

## 2019-07-24 DIAGNOSIS — J449 Chronic obstructive pulmonary disease, unspecified: Secondary | ICD-10-CM | POA: Insufficient documentation

## 2019-07-24 DIAGNOSIS — H52209 Unspecified astigmatism, unspecified eye: Secondary | ICD-10-CM | POA: Insufficient documentation

## 2019-07-24 HISTORY — PX: INSERTION OF ANTERIOR SEGMENT AQUEOUS DRAINAGE DEVICE (ISTENT): SHX6783

## 2019-07-24 HISTORY — PX: CATARACT EXTRACTION W/PHACO: SHX586

## 2019-07-24 LAB — GLUCOSE, CAPILLARY: Glucose-Capillary: 101 mg/dL — ABNORMAL HIGH (ref 70–99)

## 2019-07-24 SURGERY — PHACOEMULSIFICATION, CATARACT, WITH IOL INSERTION
Anesthesia: Monitor Anesthesia Care | Site: Eye | Laterality: Left

## 2019-07-24 MED ORDER — EPINEPHRINE PF 1 MG/ML IJ SOLN
INTRAOCULAR | Status: DC | PRN
  Administered 2019-07-24: 500 mL

## 2019-07-24 MED ORDER — CYCLOPENTOLATE-PHENYLEPHRINE 0.2-1 % OP SOLN
1.0000 [drp] | OPHTHALMIC | Status: AC | PRN
  Administered 2019-07-24 (×3): 1 [drp] via OPHTHALMIC

## 2019-07-24 MED ORDER — LIDOCAINE HCL (PF) 1 % IJ SOLN
INTRAOCULAR | Status: DC | PRN
  Administered 2019-07-24: 1 mL via OPHTHALMIC

## 2019-07-24 MED ORDER — PROVISC 10 MG/ML IO SOLN
INTRAOCULAR | Status: DC | PRN
  Administered 2019-07-24: 0.85 mL via INTRAOCULAR

## 2019-07-24 MED ORDER — BSS IO SOLN
INTRAOCULAR | Status: DC | PRN
  Administered 2019-07-24: 15 mL via INTRAOCULAR

## 2019-07-24 MED ORDER — LIDOCAINE HCL 3.5 % OP GEL: 1.0000 "application " | Freq: Once | OPHTHALMIC | Status: DC

## 2019-07-24 MED ORDER — EPINEPHRINE PF 1 MG/ML IJ SOLN
INTRAMUSCULAR | Status: AC
  Filled 2019-07-24: qty 2

## 2019-07-24 MED ORDER — MIDAZOLAM HCL 2 MG/2ML IJ SOLN
INTRAMUSCULAR | Status: AC
  Filled 2019-07-24: qty 2

## 2019-07-24 MED ORDER — PHENYLEPHRINE HCL 2.5 % OP SOLN
1.0000 [drp] | OPHTHALMIC | Status: AC | PRN
  Administered 2019-07-24 (×3): 1 [drp] via OPHTHALMIC

## 2019-07-24 MED ORDER — SODIUM HYALURONATE 23 MG/ML IO SOLN
INTRAOCULAR | Status: DC | PRN
  Administered 2019-07-24: 0.6 mL via INTRAOCULAR

## 2019-07-24 MED ORDER — POVIDONE-IODINE 5 % OP SOLN
OPHTHALMIC | Status: DC | PRN
  Administered 2019-07-24: 1 via OPHTHALMIC

## 2019-07-24 MED ORDER — MIDAZOLAM HCL 2 MG/2ML IJ SOLN
INTRAMUSCULAR | Status: DC | PRN
  Administered 2019-07-24: 1 mg via INTRAVENOUS

## 2019-07-24 MED ORDER — TETRACAINE HCL 0.5 % OP SOLN
1.0000 [drp] | OPHTHALMIC | Status: AC | PRN
  Administered 2019-07-24 (×3): 1 [drp] via OPHTHALMIC

## 2019-07-24 SURGICAL SUPPLY — 15 items
CLOTH BEACON ORANGE TIMEOUT ST (SAFETY) ×2 IMPLANT
DEVICE INJECT ISTENT W (Stent) IMPLANT
EYE SHIELD UNIVERSAL CLEAR (GAUZE/BANDAGES/DRESSINGS) ×2 IMPLANT
GLOVE BIOGEL PI IND STRL 7.0 (GLOVE) IMPLANT
GLOVE BIOGEL PI INDICATOR 7.0 (GLOVE) ×4
INJECT ISTENT W (Stent) ×3 IMPLANT
LENS ALC ACRYL/TECN (Ophthalmic Related) ×2 IMPLANT
NDL HYPO 18GX1.5 BLUNT FILL (NEEDLE) IMPLANT
NEEDLE HYPO 18GX1.5 BLUNT FILL (NEEDLE) ×3 IMPLANT
PAD ARMBOARD 7.5X6 YLW CONV (MISCELLANEOUS) ×2 IMPLANT
SYR TB 1ML LL NO SAFETY (SYRINGE) ×2 IMPLANT
TAPE SURG TRANSPORE 1 IN (GAUZE/BANDAGES/DRESSINGS) IMPLANT
TAPE SURGICAL TRANSPORE 1 IN (GAUZE/BANDAGES/DRESSINGS) ×2
VISCOELASTIC ADDITIONAL (OPHTHALMIC RELATED) ×2 IMPLANT
WATER STERILE IRR 250ML POUR (IV SOLUTION) ×2 IMPLANT

## 2019-07-24 NOTE — Anesthesia Preprocedure Evaluation (Signed)
Anesthesia Evaluation  Patient identified by MRN, date of birth, ID band Patient awake    Reviewed: Allergy & Precautions, NPO status , Patient's Chart, lab work & pertinent test results  Airway Mallampati: II       Dental  (+) Edentulous Upper, Edentulous Lower   Pulmonary COPD,    Pulmonary exam normal breath sounds clear to auscultation       Cardiovascular Exercise Tolerance: Good hypertension, Pt. on medications Normal cardiovascular exam Rhythm:Regular Rate:Normal  19-Oct-2017 12:58:24 [facility] ROUTINE RECORD Normal sinus rhythm Right bundle branch block Abnormal ECG Confirmed by Asencion Noble [phone]) on 10/21/2017 9:12:56 AM   Neuro/Psych  Headaches, Seizures -, Well Controlled,  PSYCHIATRIC DISORDERS Anxiety Depression    GI/Hepatic GERD  Medicated,  Endo/Other  diabetes, Well Controlled, Type 2, Oral Hypoglycemic Agents, Insulin Dependent  Renal/GU   negative genitourinary   Musculoskeletal   Abdominal (+) + obese,   Peds  Hematology  (+) anemia ,   Anesthesia Other Findings   Reproductive/Obstetrics                            Anesthesia Physical Anesthesia Plan  ASA: III  Anesthesia Plan: MAC   Post-op Pain Management:    Induction:   PONV Risk Score and Plan:   Airway Management Planned: Nasal Cannula and Natural Airway  Additional Equipment:   Intra-op Plan:   Post-operative Plan:   Informed Consent: I have reviewed the patients History and Physical, chart, labs and discussed the procedure including the risks, benefits and alternatives for the proposed anesthesia with the patient or authorized representative who has indicated his/her understanding and acceptance.       Plan Discussed with: CRNA  Anesthesia Plan Comments:         Anesthesia Quick Evaluation

## 2019-07-24 NOTE — Anesthesia Postprocedure Evaluation (Signed)
Anesthesia Post Note  Patient: Carrie Turner  Procedure(s) Performed: CATARACT EXTRACTION PHACO AND INTRAOCULAR LENS PLACEMENT LEFT EYE  (CDE: 5.60) (Left Eye) INSERTION OF ANTERIOR SEGMENT AQUEOUS DRAINAGE DEVICE (ISTENT) LEFT EYE (Left Eye)  Patient location during evaluation: Short Stay Anesthesia Type: MAC Level of consciousness: awake and alert Pain management: pain level controlled Vital Signs Assessment: post-procedure vital signs reviewed and stable Respiratory status: spontaneous breathing Cardiovascular status: stable Postop Assessment: no apparent nausea or vomiting Anesthetic complications: no     Last Vitals:  Vitals:   07/24/19 0841  Resp: 20  Temp: 36.5 C  SpO2: 93%    Last Pain:  Vitals:   07/24/19 0841  TempSrc: Oral  PainSc: 0-No pain                 Everette Rank

## 2019-07-24 NOTE — Op Note (Signed)
Date of procedure: 07/24/19  Pre-operative diagnosis:   1. Visually significant combined-form age-related cataract, Left Eye (H25.812) 2. Primary open angle glaucoma, mild stage, left eye  Post-operative diagnosis:  1. Visually significant age-related cataract, Left Eye  2. Primary open angle glaucoma, mild stage, left eye  Procedure:  1) Removal of cataract via phacoemulsification and insertion of intra-ocular lens [place]  +22.0D into the capsular bag of the Left Eye 2) Placement of iStent inject into the trabecular meshwork of the left eye  Attending surgeon: Gerda Diss. Lajuan Kovaleski, MD, MA  Anesthesia: MAC, Topical [place]  Complications: None  Estimated Blood Loss: <56m (minimal)  Specimens: None  Implants: As above  Indications:  1. Visually significant age-related cataract, Left Eye  2. Primary open angle glaucoma, mild stage, left eye  Procedure:  The patient was seen and identified in the pre-operative area. The operative eye was identified and dilated.  The operative eye was marked.  Topical anesthesia was administered to the operative eye.     The patient was then to the operative suite and placed in the supine position.  A timeout was performed confirming the patient, procedure to be performed, and all other relevant information.   The patient's face was prepped and draped in the usual fashion for intra-ocular surgery.  A lid speculum was placed into the operative eye and the surgical microscope moved into place and focused.  An inferotemporal paracentesis was created using a 20 gauge paracentesis blade.  Shugarcaine was injected into the anterior chamber.  Viscoelastic was injected into the anterior chamber.  A temporal clear-corneal main wound incision was created using a 2.474mmicrokeratome.  A continuous curvilinear capsulorrhexis was initiated using an irrigating cystitome and completed using capsulorrhexis forceps.  Hydrodissection and  hydrodeliniation were performed.  Viscoelastic was injected into the anterior chamber.  A phacoemulsification handpiece and a chopper as a second instrument were used to remove the nucleus and epinucleus. The irrigation/aspiration handpiece was used to remove any remaining cortical material.   The capsular bag was reinflated with viscoelastic, checked, and found to be intact.  The intraocular lens was inserted into the capsular bag and dialed into place using a Kuglen hook.    At this point, the patient's head and the microscope were repositioned.  An iStent inject was placed the the trabecular meshwork under gonioscopy.  A second iStent inject was placed in the trabecular meshwork 2 clock hours away.  The patient's head and microscope were returned to the original position.  The irrigation/aspiration handpiece was used to remove any remaining viscoelastic.  The clear corneal wound and paracentesis wounds were then hydrated and checked with Weck-Cels to be watertight.  The lid-speculum and drape was removed, and the patient's face was cleaned with a wet and dry 4x4.  A clear shield was taped over the eye. The patient was taken to the post-operative care unit in good condition, having tolerated the procedure well.  Post-Op Instructions: [facility] and will receive all other orders and instructions.

## 2019-07-24 NOTE — Transfer of Care (Signed)
Immediate Anesthesia Transfer of Care Note  Patient: Carrie Turner  Procedure(s) Performed: CATARACT EXTRACTION PHACO AND INTRAOCULAR LENS PLACEMENT LEFT EYE  (CDE: 5.60) (Left Eye) INSERTION OF ANTERIOR SEGMENT AQUEOUS DRAINAGE DEVICE (ISTENT) LEFT EYE (Left Eye)  Patient Location: Short Stay  Anesthesia Type:MAC  Level of Consciousness: awake, alert  and patient cooperative  Airway & Oxygen Therapy: Patient Spontanous Breathing  Post-op Assessment: Report given to RN and Post -op Vital signs reviewed and stable  Post vital signs: Reviewed and stable  Last Vitals:  Vitals Value Taken Time  BP    Temp    Pulse    Resp    SpO2      Last Pain:  Vitals:   07/24/19 0841  TempSrc: Oral  PainSc: 0-No pain      Patients Stated Pain Goal: 8 (57/89/78 [id number])  Complications: No apparent anesthesia complications

## 2019-07-24 NOTE — Discharge Instructions (Signed)
Please discharge patient when stable, will follow up today with Dr. Allona Gondek at the [facility] [place] office immediately following discharge.  Leave shield in place until visit.  All paperwork with discharge instructions will be given at the office.  [facility] Address:  [address]  [place], [place] [address]      Monitored Anesthesia Care, Care After These instructions provide you with information about caring for yourself after your procedure. Your health care provider may also give you more specific instructions. Your treatment has been planned according to current medical practices, but problems sometimes occur. Call your health care provider if you have any problems or questions after your procedure. What can I expect after the procedure? After your procedure, you may:  Feel sleepy for several hours.  Feel clumsy and have poor balance for several hours.  Feel forgetful about what happened after the procedure.  Have poor judgment for several hours.  Feel nauseous or vomit.  Have a sore throat if you had a breathing tube during the procedure. Follow these instructions at home: For at least 24 hours after the procedure:      Have a responsible adult stay with you. It is important to have someone help care for you until you are awake and alert.  Rest as needed.  Do not: ? Participate in activities in which you could fall or become injured. ? Drive. ? Use heavy machinery. ? Drink alcohol. ? Take sleeping pills or medicines that cause drowsiness. ? Make important decisions or sign legal documents. ? Take care of children on your own. Eating and drinking  Follow the diet that is recommended by your health care provider.  If you vomit, drink water, juice, or soup when you can drink without vomiting.  Make sure you have little or no nausea before eating solid foods. General instructions  Take over-the-counter and prescription  medicines only as told by your health care provider.  If you have sleep apnea, surgery and certain medicines can increase your risk for breathing problems. Follow instructions from your health care provider about wearing your sleep device: ? Anytime you are sleeping, including during daytime naps. ? While taking prescription pain medicines, sleeping medicines, or medicines that make you drowsy.  If you smoke, do not smoke without supervision.  Keep all follow-up visits as told by your health care provider. This is important. Contact a health care provider if:  You keep feeling nauseous or you keep vomiting.  You feel light-headed.  You develop a rash.  You have a fever. Get help right away if:  You have trouble breathing. Summary  For several hours after your procedure, you may feel sleepy and have poor judgment.  Have a responsible adult stay with you for at least 24 hours or until you are awake and alert. This information is not intended to replace advice given to you by your health care provider. Make sure you discuss any questions you have with your health care provider. Document Released: 11/01/2015 Document Revised: 10/09/2017 Document Reviewed: 11/01/2015 Elsevier Patient Education  2020 [employer]  

## 2019-07-24 NOTE — Interval H&P Note (Signed)
History and Physical Interval Note: The H and P was reviewed and updated. The patient was examined.  No changes were found after exam.  The surgical eye was marked.  07/24/2019 9:55 AM  Carrie Turner  has presented today for surgery, with the diagnosis of nuclear cataract left eye, glaucoma.  The various methods of treatment have been discussed with the patient and family. After consideration of risks, benefits and other options for treatment, the patient has consented to  Procedure(s) with comments: CATARACT EXTRACTION PHACO AND INTRAOCULAR LENS PLACEMENT LEFT EYE (Left) - left INSERTION OF ANTERIOR SEGMENT AQUEOUS DRAINAGE DEVICE (ISTENT) LEFT EYE (Left) as a surgical intervention.  The patient's history has been reviewed, patient examined, no change in status, stable for surgery.  I have reviewed the patient's chart and labs.  Questions were answered to the patient's satisfaction.     Baruch Goldmann

## 2019-07-29 NOTE — H&P (Addendum)
Surgical History & Physical   Patient Name: Carrie Turner DOB: Apr 25, 1951  Surgery: Cataract extraction with intraocular lens implant phacoemulsification and insertion of aqueous drainage device; Right Eye  Surgeon: Baruch Goldmann MD Surgery Date:  08/19/2019 Pre-Op Date:  07/29/2019  HPI: A 50 Yr. old female patient 1. The patient is returning after cataract surgery. The left eye is affected. Status post cataract surgery on 07-24-2019: Onset was S/P iStent. Since the last visit, the affected area feels improvement and is doing well. The patient's vision is improved. Patient is following medication instructions with drops of latanoprost QHS OU, Alphagan BID OU, and not using any "presurgical drops" since surgery (only used before surgery). *Patient confused with drops-did not bring any drops or instructions today to exam.  Medical History: Glaucoma Cataracts Diabetes High Blood Pressure LDL  Review of Systems Negative Allergic/Immunologic Negative Cardiovascular Negative Constitutional Negative Ear, Nose, Mouth & Throat Negative Endocrine Negative Gastrointestinal Negative Genitourinary Negative Hemotologic/Lymphatic Negative Integumentary Negative Musculoskeletal Negative Neurological Negative Psychiatry Negative Respiratory  Social   Former smoker   Medication Alphagan P, Latanoprost, Vigamox, Ilevro, Prednisolone acetate 1%,  Lisinopril, Amlodipine Besylate, NovoLog, Acid reflux,   Sx/Procedures Phaco c IOL OS with iStent inject,  Foot Surgery, Knee Surgery, Hysterectomy,   Drug Allergies   NKDA  History & Physical: Heent:  Cataract and Glaucoma, Right eye NECK: supple without bruits LUNGS: lungs clear to auscultation CV: regular rate and rhythm Abdomen: soft and non-tender  Impression & Plan: Assessment: 1.  COMBINED FORMS AGE RELATED CATARACT; Right Eye (H25.811) 2.  CATARACT EXTRACTION STATUS; Left Eye (Z98.42) 3.  PRIMARY OPEN ANGLE GLAUCOMA; Both  Eyes Moderate (H40.1132)  Plan: 1.  Cataract accounts for the patient's decreased vision. This visual impairment is not correctable with a tolerable change in glasses or contact lenses. Cataract surgery with an implantation of a new lens should significantly improve the visual and functional status of the patient. Discussed all risks, benefits, alternatives, and potential complications. Discussed the procedures and recovery. Patient desires to have surgery. A-scan ordered and performed today for intra-ocular lens calculations. The surgery will be performed in order to improve vision for driving, reading, and for eye examinations. Recommend phacoemulsification with intra-ocular lens. Right Eye. Surgery required to correct imbalance of vision. Dilates well - shugarcaine by protocol. Recommend iSent inject to control pressure in setting of glaucoma and improve compliance. 2.  1 week after cataract surgery. Doing well with improved vision and normal eye pressure. Call with any problems or concerns. Stop Vigamox. Continue Ilevro 1 drop 1x/day for 3 more weeks. Continue Pred Acetate 1 drop 2x/day for 3 more weeks. 3.  Currently on Alphagan and Latanoprost. S/P iStent Inject W OS. IOPs much improved today. OCT rNFL OD - thinning. Unable OS. recommend iStent inject W right eye at the time of cataract surgery to help lower eye pressure given elevated IOP despite regular topical glaucoma drop use and especially given poor compliance. Continue glaucoma drops as currently using. Detailed discussion about glaucoma today including importance of maintaining good follow up and following treatment plan, and the possibility of irreversible blindness as part of this disease process.

## 2019-07-31 ENCOUNTER — Other Ambulatory Visit: Payer: Self-pay

## 2019-07-31 ENCOUNTER — Encounter: Payer: Self-pay | Admitting: Gastroenterology

## 2019-07-31 ENCOUNTER — Ambulatory Visit (INDEPENDENT_AMBULATORY_CARE_PROVIDER_SITE_OTHER): Payer: Medicare Other | Admitting: Gastroenterology

## 2019-07-31 DIAGNOSIS — R112 Nausea with vomiting, unspecified: Secondary | ICD-10-CM

## 2019-07-31 NOTE — Patient Instructions (Signed)
1. Gastric emptying study as scheduled.  We will contact you with results as available. 2. Continue Dexilant for acid reflux. 3. Continue Amitiza 8 mcg twice daily for constipation. 4. You may continue to use Carafate and Zofran as needed for nausea. 5. Strive for further weight loss as well as improve management of your diabetes. 6. I am including a handout regarding gastroparesis which is the condition we are checking for with upcoming x-ray.   Gastroparesis  Gastroparesis is a condition in which food takes longer than normal to empty from the stomach. The condition is usually long-lasting (chronic). It may also be called delayed gastric emptying. There is no cure, but there are treatments and things that you can do at home to help relieve symptoms. Treating the underlying condition that causes gastroparesis can also help relieve symptoms. What are the causes? In many cases, the cause of this condition is not known. Possible causes include:  A hormone (endocrine) disorder, such as hypothyroidism or diabetes.  A nervous system disease, such as Parkinson's disease or multiple sclerosis.  Cancer, infection, or surgery that affects the stomach or vagus nerve. The vagus nerve runs from your chest, through your neck, to the lower part of your brain.  A connective tissue disorder, such as scleroderma.  Certain medicines. What increases the risk? You are more likely to develop this condition if you:  Have certain disorders or diseases, including: ? An endocrine disorder. ? An eating disorder. ? Amyloidosis. ? Scleroderma. ? Parkinson's disease. ? Multiple sclerosis. ? Cancer or infection of the stomach or the vagus nerve.  Have had surgery on the stomach or vagus nerve.  Take certain medicines.  Are female. What are the signs or symptoms? Symptoms of this condition include:  Feeling full after eating very little.  Nausea.  Vomiting.  Heartburn.  Abdominal  bloating.  Inconsistent blood sugar (glucose) levels on blood tests.  Lack of appetite.  Weight loss.  Acid from the stomach coming up into the esophagus (gastroesophageal reflux).  Sudden tightening (spasm) of the stomach, which can be painful. Symptoms may come and go. Some people may not notice any symptoms. How is this diagnosed? This condition is diagnosed with tests, such as:  Tests that check how long it takes food to move through the stomach and intestines. These tests include: ? Upper gastrointestinal (GI) series. For this test, you drink a liquid that shows up well on X-rays, and then X-rays will be taken of your intestines. ? Gastric emptying scintigraphy. For this test, you eat food that contains a small amount of radioactive material, and then scans are taken. ? Wireless capsule GI monitoring system. For this test, you swallow a pill (capsule) that records information about how foods and fluid move through your stomach.  Gastric manometry. For this test, a tube is passed down your throat and into your stomach to measure electrical and muscular activity.  Endoscopy. For this test, a long, thin tube is passed down your throat and into your stomach to check for problems in your stomach lining.  Ultrasound. This test uses sound waves to create images of inside the body. This can help rule out gallbladder disease or pancreatitis as a cause of your symptoms. How is this treated? There is no cure for gastroparesis. Treatment may include:  Treating the underlying cause.  Managing your symptoms by making changes to your diet and exercise habits.  Taking medicines to control nausea and vomiting and to stimulate stomach muscles.  Getting food  through a feeding tube in the hospital. This may be done in severe cases.  Having surgery to insert a device into your body that helps improve stomach emptying and control nausea and vomiting (gastric neurostimulator). Follow these  instructions at home:  Take over-the-counter and prescription medicines only as told by your health care provider.  Follow instructions from your health care provider about eating or drinking restrictions. Your health care provider may recommend that you: ? Eat smaller meals more often. ? Eat low-fat foods. ? Eat low-fiber forms of high-fiber foods. For example, eat cooked vegetables instead of raw vegetables. ? Have only liquid foods instead of solid foods. Liquid foods are easier to digest.  Drink enough fluid to keep your urine pale yellow.  Exercise as often as told by your health care provider.  Keep all follow-up visits as told by your health care provider. This is important. Contact a health care provider if you:  Notice that your symptoms do not improve with treatment.  Have new symptoms. Get help right away if you:  Have severe abdominal pain that does not improve with treatment.  Have nausea that is severe or does not go away.  Cannot drink fluids without vomiting. Summary  Gastroparesis is a chronic condition in which food takes longer than normal to empty from the stomach.  Symptoms include nausea, vomiting, heartburn, abdominal bloating, and loss of appetite.  Eating smaller portions, and low-fat, low-fiber foods may help you manage your symptoms.  Get help right away if you have severe abdominal pain. This information is not intended to replace advice given to you by your health care provider. Make sure you discuss any questions you have with your health care provider. Document Revised: 10/09/2017 Document Reviewed: 05/16/2017 Elsevier Patient Education  2020 [employer].

## 2019-07-31 NOTE — Progress Notes (Signed)
Primary Care Physician:  Monico Blitz, MD Primary GI:  Garfield Cornea, MD    Patient Location: Home  Provider Location: [facility] office  Reason for Phone Visit:  Chief Complaint  Patient presents with  . Nausea    "not as bad", mostly in the mornings  . Gastroesophageal Reflux    f/u.  . constipation/diarrhea    "goes back and forth"     Persons present on the phone encounter, with roles: Patient, myself (provider),Mindy Estudillo CMA (updated meds and allergies)  Total time (minutes) spent on medical discussion: 11 minutes  Due to COVID-19, visit was conducted using telephonic method (no video was available).  Visit was requested by patient.  Virtual Visit via Telephone only  I connected with Ms. Ruud on 07/31/19 at  3:30 PM EST by telephone and verified that I am speaking with the correct person using two identifiers.   I discussed the limitations, risks, security and privacy concerns of performing an evaluation and management service by telephone and the availability of in person appointments. I also discussed with the patient that there may be a patient responsible charge related to this service. The patient expressed understanding and agreed to proceed.   HPI:   Patient is a pleasant 69 y/o female who presents for telephone visit for follow-up.  She was last seen in August.  She has a history of GERD, constipation, nausea, epigastric pain.  Colonoscopy up-to-date, in 2019 with a single tubular adenoma, internal hemorrhoids, random colon biopsies negative.  Recommended 5-year surveillance. EGD at that time with erosive reflux esophagitis, empirical dilation of esophagus with dysphagia, hiatal hernia, small bowel biopsies negative for celiac. History of poorly controlled diabetes which was felt to be contributing to her nausea. Linzess caused loose stools and incontinence.  At last office visit she was switched to Amitiza 8 mcg twice daily.  Zofran continued as needed for  nausea.  Overall doing fair.  Nausea continues intermittently.  Seems to be worse with meals and yogurt.  Worse with fried foods.  Recently drank chocolate milk before laying down at night.  She woke up with vomiting.  Seems to do okay with eggs.  Denies any significant heartburn.  Takes Dexilant.  Nausea is her biggest concern.  Takes Carafate and Zofran as needed.  Seems to help but she is frustrated with persistent nausea.  A1c has improved.  She has lost some weight over the past year but in the last few months she has gained 7 pounds back.  Bowel movements are good.  Bowel movement every day.  Takes Amitiza twice per day.  Current Outpatient Medications  Medication Sig Dispense Refill  . acetaminophen (TYLENOL) 500 MG tablet Take 1,000 mg by mouth every 6 (six) hours as needed (for pain/headaches.).    Marland Kitchen albuterol (VENTOLIN HFA) 108 (90 Base) MCG/ACT inhaler Inhale 1-2 puffs into the lungs every 6 (six) hours as needed for wheezing or shortness of breath.    Marland Kitchen amLODipine (NORVASC) 2.5 MG tablet Take 2.5 mg by mouth daily.    Marland Kitchen aspirin EC 81 MG tablet Take 81 mg by mouth daily.    . Blood Glucose Monitoring Suppl (ONETOUCH VERIO) w/Device KIT 1 each by Does not apply route 4 (four) times daily. E11.65 1 kit 0  . brimonidine (ALPHAGAN P) 0.1 % SOLN Place 1 drop into both eyes 2 (two) times daily.    . Calcium Carb-Cholecalciferol (CALCIUM 500 + D3 PO) Take 1 tablet by mouth daily.    Marland Kitchen  Cholecalciferol (VITAMIN D3) 2000 units TABS Take 2,000 Units by mouth daily.    . citalopram (CELEXA) 40 MG tablet Take 40 mg by mouth daily.    . Continuous Blood Gluc Sensor (FREESTYLE LIBRE 14 DAY SENSOR) MISC Inject 1 each into the skin every 14 (fourteen) days. Use as directed. 2 each 2  . dexlansoprazole (DEXILANT) 60 MG capsule Take 1 capsule (60 mg total) by mouth daily. 90 capsule 3  . docusate sodium (COLACE) 100 MG capsule Take 100 mg by mouth 2 (two) times daily as needed for mild constipation.    .  eszopiclone (LUNESTA) 2 MG TABS tablet Take 2 mg by mouth at bedtime as needed for sleep. Take immediately before bedtime    . fenofibrate (TRICOR) 145 MG tablet TAKE 1 TABLET ONCE DAILY. (Patient taking differently: Take 145 mg by mouth daily. ) 30 tablet 2  . gabapentin (NEURONTIN) 800 MG tablet Take 800 mg by mouth 4 (four) times daily.     Marland Kitchen glipiZIDE (GLUCOTROL XL) 2.5 MG 24 hr tablet Take 2 tablets (5 mg total) by mouth daily with breakfast. 30 tablet 0  . insulin lispro (HUMALOG) 100 UNIT/ML KwikPen INJECT 18-24 UNITS INTO THE SKIN 3 TIMES DAILY 30 mL 0  . Insulin Pen Needle (PEN NEEDLES) 31G X 6 MM MISC 1 each by Does not apply route 4 (four) times daily. 400 each 1  . latanoprost (XALATAN) 0.005 % ophthalmic solution Place 1 drop into both eyes at bedtime.    Marland Kitchen LEVEMIR FLEXTOUCH 100 UNIT/ML Pen INJECT 80 UNITS SUB-Q EACH DAY AT BEDTIME (Patient taking differently: Inject 80 Units into the skin at bedtime. ) 60 mL 2  . lisinopril-hydrochlorothiazide (PRINZIDE,ZESTORETIC) 20-12.5 MG tablet Take 2 tablets by mouth daily.     Marland Kitchen lubiprostone (AMITIZA) 8 MCG capsule Take 1 capsule (8 mcg total) by mouth 2 (two) times daily with a meal. 60 capsule 3  . ondansetron (ZOFRAN) 8 MG tablet Take 1 tablet (8 mg total) by mouth every 8 (eight) hours as needed for nausea or vomiting. 30 tablet 3  . ONETOUCH VERIO test strip USE 1 STRIP TO CHECK GLUCOSE 4 TIMES DAILY 150 each 5  . pravastatin (PRAVACHOL) 40 MG tablet Take 40 mg by mouth daily.    Marland Kitchen pyridOXINE (VITAMIN B-6) 100 MG tablet Take 100 mg by mouth daily.    . sucralfate (CARAFATE) 1 g tablet Take 1 tablet (1 g total) by mouth 4 (four) times daily as needed (crush and mix with water as needed for upset stomach). 60 tablet 0  . vitamin B-12 (CYANOCOBALAMIN) 500 MCG tablet Take 500 mcg by mouth daily.     No current facility-administered medications for this visit.    Past Medical History:  Diagnosis Date  . Anxiety   . COPD (chronic  obstructive pulmonary disease) ([place])   . Depression   . Diabetes mellitus, type II ([place])   . GERD (gastroesophageal reflux disease)   . Glaucoma   . Hyperlipidemia   . Hypertension   . Migraines   . Neuropathy   . Seizures ([place])    seizure was from ETOH, "a long time ago", no med and no recurrance  . Vitamin D deficiency     Past Surgical History:  Procedure Laterality Date  . ABDOMINAL HYSTERECTOMY    . CATARACT EXTRACTION W/PHACO Left 07/24/2019   Procedure: CATARACT EXTRACTION PHACO AND INTRAOCULAR LENS PLACEMENT LEFT EYE  (CDE: 5.60);  Surgeon: Baruch Goldmann, MD;  Location: [facility] ORS;  Service: Ophthalmology;  Laterality: Left;  . COLONOSCOPY  11/2015   Dr. Britta Mccreedy: sessile polyp removed (benign). advised repeat colonoscopy in 5 years.   . COLONOSCOPY WITH PROPOFOL N/A 10/26/2017   RMR: Tubular adenoma removed.  Nonbleeding internal hemorrhoids.  Next colonoscopy in 5 years.  . ESOPHAGEAL DILATION  10/26/2017   Procedure: ESOPHAGEAL DILATION;  Surgeon: Daneil Dolin, MD;  Location: [facility] SUITE;  Service: Endoscopy;;  . ESOPHAGOGASTRODUODENOSCOPY  11/2015   Dr. Britta Mccreedy: hiatal hernia  . ESOPHAGOGASTRODUODENOSCOPY (EGD) WITH PROPOFOL N/A 10/26/2017   RMR: Erosive reflux esophagitis, hiatal hernia.  . INSERTION OF ANTERIOR SEGMENT AQUEOUS DRAINAGE DEVICE (ISTENT) Left 07/24/2019   Procedure: INSERTION OF ANTERIOR SEGMENT AQUEOUS DRAINAGE DEVICE (ISTENT) LEFT EYE;  Surgeon: Baruch Goldmann, MD;  Location: [facility];  Service: Ophthalmology;  Laterality: Left;  . KNEE ARTHROSCOPY Right   . ORIF ANKLE FRACTURE Right     Family History  Problem Relation Age of Onset  . Hypertension Mother   . Colon cancer Mother        diagnosed early 53s  . Cerebral aneurysm Mother   . Alcohol abuse Father   . Pancreatic cancer Sister        deceased in 81s  . Colon cancer Sister        diagnosed 27 y/o  . Stroke Brother   . Cancer Maternal Grandmother   . Cancer Maternal Grandfather   . Cancer  Paternal Grandmother   . Other Paternal Grandfather   . Other Brother     Social History   Socioeconomic History  . Marital status: Single    Spouse name: Not on file  . Number of children: Not on file  . Years of education: Not on file  . Highest education level: Not on file  Occupational History  . Not on file  Tobacco Use  . Smoking status: Never Smoker  . Smokeless tobacco: Never Used  Substance and Sexual Activity  . Alcohol use: No  . Drug use: No  . Sexual activity: Never  Other Topics Concern  . Not on file  Social History Narrative  . Not on file   Social Determinants of Health   Financial Resource Strain:   . Difficulty of Paying Living Expenses: Not on file  Food Insecurity:   . Worried About Charity fundraiser in the Last Year: Not on file  . Ran Out of Food in the Last Year: Not on file  Transportation Needs:   . Lack of Transportation (Medical): Not on file  . Lack of Transportation (Non-Medical): Not on file  Physical Activity:   . Days of Exercise per Week: Not on file  . Minutes of Exercise per Session: Not on file  Stress:   . Feeling of Stress : Not on file  Social Connections:   . Frequency of Communication with Friends and Family: Not on file  . Frequency of Social Gatherings with Friends and Family: Not on file  . Attends Religious Services: Not on file  . Active Member of Clubs or Organizations: Not on file  . Attends Archivist Meetings: Not on file  . Marital Status: Not on file  Intimate Partner Violence:   . Fear of Current or Ex-Partner: Not on file  . Emotionally Abused: Not on file  . Physically Abused: Not on file  . Sexually Abused: Not on file      ROS:  General: Negative for anorexia, weight loss, fever, chills, fatigue, weakness. Eyes: Negative  for vision changes.  ENT: Negative for hoarseness, difficulty swallowing , nasal congestion. CV: Negative for chest pain, angina, palpitations, dyspnea on exertion,  peripheral edema.  Respiratory: Negative for dyspnea at rest, dyspnea on exertion, cough, sputum, wheezing.  GI: See history of present illness. GU:  Negative for dysuria, hematuria, urinary incontinence, urinary frequency, nocturnal urination.  MS: Negative for joint pain, low back pain.  Derm: Negative for rash or itching.  Neuro: Negative for weakness, abnormal sensation, seizure, frequent headaches, memory loss, confusion.  Psych: Negative for anxiety, depression, suicidal ideation, hallucinations.  Endo: Negative for unusual weight change.  Heme: Negative for bruising or bleeding. Allergy: Negative for rash or hives.   Observations/Objective: Patient reports 2997 pounds at home.  She is alert and oriented.  No acute distress.  Lab Results  Component Value Date   HGBA1C 9.0 06/04/2019   Lab Results  Component Value Date   CREATININE 0.7 06/04/2019   BUN 17 06/04/2019   NA 135 (A) 06/04/2019   K 3.8 06/04/2019   CL 95 (A) 06/04/2019   CO2 25 10/19/2017   Lab Results  Component Value Date   ALT  25  06/04/2019   AST  21.7     ALKPHOS 111    BILITOT 0.2    Lab Results  Component Value Date   WBC 10.7 (H) 10/19/2017   HGB 13.2 10/19/2017   HCT 42.6 10/19/2017   MCV 96.8 10/19/2017   PLT 447 (H) 10/19/2017    Assessment and Plan: Pleasant 69 year old female with history of diabetes, hypertension, GERD, COPD, anxiety/depression presenting for follow-up of chronic nausea/GERD/constipation.  Constipation is well managed on Amitiza 8 mcg twice daily.  Continue current regimen.  GERD well controlled on Dexilant 60 mg daily.  Continue current regimen.  Continues to have intermittent nausea with occasional vomiting.  She correlates with some dairy products as well as fried foods.  Nausea is fairly persistent but vomiting worse with the specific food items.  Carafate and Zofran as needed helps.  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  We discussed potential diagnostic testing with  gastric emptying study versus empirically treating.  Patient is adamant about having testing to that she knows for sure if she has gastroparesis and she wants a definitive answer to her symptoms.  We discussed that typical treatment would be related to her dietary intake rather than medication for treatment.  She understands and wants to pursue testing.  Encouraged continue to pursue weight loss and tight glycemic control.  Follow Up Instructions:    I discussed the assessment and treatment plan with the patient. The patient was provided an opportunity to ask questions and all were answered. The patient agreed with the plan and demonstrated an understanding of the instructions. AVS mailed to patient's home address.   The patient was advised to call back or seek an in-person evaluation if the symptoms worsen or if the condition fails to improve as anticipated.  I provided 11 minutes of non-face-to-face time during this encounter.   Neil Crouch, PA-C

## 2019-08-01 ENCOUNTER — Telehealth: Payer: Self-pay | Admitting: Internal Medicine

## 2019-08-01 ENCOUNTER — Other Ambulatory Visit: Payer: Self-pay

## 2019-08-01 ENCOUNTER — Telehealth: Payer: Self-pay

## 2019-08-01 ENCOUNTER — Encounter (HOSPITAL_COMMUNITY)
Admission: RE | Admit: 2019-08-01 | Discharge: 2019-08-01 | Disposition: A | Discharge status: Home / Self Care | Payer: Medicare Other | Source: Ambulatory Visit | Attending: Ophthalmology | Admitting: Ophthalmology

## 2019-08-01 NOTE — Telephone Encounter (Signed)
Patient returned call, please call back  

## 2019-08-01 NOTE — Pre-Procedure Instructions (Signed)
Called to make aware of time for surgery on 08/05/2019 and she wants to reschedule because "of the weather coming". Office notified and will call to reschedule.

## 2019-08-01 NOTE — Telephone Encounter (Signed)
Tried to call pt, no answer, LMOAM for return call. 

## 2019-08-01 NOTE — Progress Notes (Signed)
CC'ED TO PCP 

## 2019-08-01 NOTE — Telephone Encounter (Signed)
GES scheduled for 08/07/19 at 8:00am, arrive at 7:45am. NPO and no stomach meds after midnight prior to test.  PA for GES submitted via [facility] website. Case approved. PA# [id number], valid 08/01/19-09/15/19.  Tried to call pt, no answer, LMOAM for return call.

## 2019-08-01 NOTE — Telephone Encounter (Signed)
LMOVM, see MB prior note

## 2019-08-02 ENCOUNTER — Other Ambulatory Visit (HOSPITAL_COMMUNITY): Payer: Medicare Other

## 2019-08-02 NOTE — Telephone Encounter (Signed)
Called and informed pt of appt. Letter mailed. 

## 2019-08-07 ENCOUNTER — Ambulatory Visit (HOSPITAL_COMMUNITY): Payer: Medicare Other

## 2019-08-08 DIAGNOSIS — I10 Essential (primary) hypertension: Secondary | ICD-10-CM | POA: Diagnosis not present

## 2019-08-08 DIAGNOSIS — E119 Type 2 diabetes mellitus without complications: Secondary | ICD-10-CM | POA: Diagnosis not present

## 2019-08-08 DIAGNOSIS — E7800 Pure hypercholesterolemia, unspecified: Secondary | ICD-10-CM | POA: Diagnosis not present

## 2019-08-15 ENCOUNTER — Encounter (HOSPITAL_COMMUNITY)
Admission: RE | Admit: 2019-08-15 | Discharge: 2019-08-15 | Disposition: A | Discharge status: Home / Self Care | Payer: Medicare Other | Source: Ambulatory Visit | Attending: Ophthalmology | Admitting: Ophthalmology

## 2019-08-15 ENCOUNTER — Other Ambulatory Visit: Payer: Self-pay

## 2019-08-15 DIAGNOSIS — H25811 Combined forms of age-related cataract, right eye: Secondary | ICD-10-CM | POA: Diagnosis not present

## 2019-08-16 ENCOUNTER — Other Ambulatory Visit (HOSPITAL_COMMUNITY)
Admission: RE | Admit: 2019-08-16 | Discharge: 2019-08-16 | Disposition: A | Discharge status: Home / Self Care | Payer: Medicare Other | Source: Ambulatory Visit | Attending: Ophthalmology | Admitting: Ophthalmology

## 2019-08-16 ENCOUNTER — Other Ambulatory Visit: Payer: Self-pay

## 2019-08-16 DIAGNOSIS — Z01812 Encounter for preprocedural laboratory examination: Secondary | ICD-10-CM | POA: Insufficient documentation

## 2019-08-16 DIAGNOSIS — Z20822 Contact with and (suspected) exposure to covid-19: Secondary | ICD-10-CM | POA: Insufficient documentation

## 2019-08-16 LAB — SARS CORONAVIRUS 2 (TAT 6-24 HRS): SARS Coronavirus 2: NEGATIVE

## 2019-08-19 ENCOUNTER — Encounter (HOSPITAL_COMMUNITY)
Admission: RE | Disposition: A | Discharge status: Home / Self Care | Payer: Self-pay | Source: Home / Self Care | Attending: Ophthalmology

## 2019-08-19 ENCOUNTER — Ambulatory Visit (HOSPITAL_COMMUNITY): Payer: Medicare Other | Admitting: Anesthesiology

## 2019-08-19 ENCOUNTER — Other Ambulatory Visit: Payer: Self-pay

## 2019-08-19 ENCOUNTER — Ambulatory Visit (HOSPITAL_COMMUNITY)
Admission: RE | Admit: 2019-08-19 | Discharge: 2019-08-19 | Disposition: A | Discharge status: Home / Self Care | Payer: Medicare Other | Attending: Ophthalmology | Admitting: Ophthalmology

## 2019-08-19 ENCOUNTER — Encounter (HOSPITAL_COMMUNITY): Payer: Self-pay | Admitting: Ophthalmology

## 2019-08-19 DIAGNOSIS — H2511 Age-related nuclear cataract, right eye: Secondary | ICD-10-CM | POA: Diagnosis not present

## 2019-08-19 DIAGNOSIS — Z87891 Personal history of nicotine dependence: Secondary | ICD-10-CM | POA: Insufficient documentation

## 2019-08-19 DIAGNOSIS — F419 Anxiety disorder, unspecified: Secondary | ICD-10-CM | POA: Insufficient documentation

## 2019-08-19 DIAGNOSIS — Z79899 Other long term (current) drug therapy: Secondary | ICD-10-CM | POA: Diagnosis not present

## 2019-08-19 DIAGNOSIS — G40909 Epilepsy, unspecified, not intractable, without status epilepticus: Secondary | ICD-10-CM | POA: Insufficient documentation

## 2019-08-19 DIAGNOSIS — H25811 Combined forms of age-related cataract, right eye: Secondary | ICD-10-CM | POA: Diagnosis not present

## 2019-08-19 DIAGNOSIS — H401112 Primary open-angle glaucoma, right eye, moderate stage: Secondary | ICD-10-CM | POA: Diagnosis not present

## 2019-08-19 DIAGNOSIS — E1136 Type 2 diabetes mellitus with diabetic cataract: Secondary | ICD-10-CM | POA: Diagnosis not present

## 2019-08-19 DIAGNOSIS — J449 Chronic obstructive pulmonary disease, unspecified: Secondary | ICD-10-CM | POA: Diagnosis not present

## 2019-08-19 DIAGNOSIS — F329 Major depressive disorder, single episode, unspecified: Secondary | ICD-10-CM | POA: Diagnosis not present

## 2019-08-19 DIAGNOSIS — H401132 Primary open-angle glaucoma, bilateral, moderate stage: Secondary | ICD-10-CM | POA: Diagnosis not present

## 2019-08-19 DIAGNOSIS — Z794 Long term (current) use of insulin: Secondary | ICD-10-CM | POA: Diagnosis not present

## 2019-08-19 DIAGNOSIS — K219 Gastro-esophageal reflux disease without esophagitis: Secondary | ICD-10-CM | POA: Diagnosis not present

## 2019-08-19 DIAGNOSIS — I10 Essential (primary) hypertension: Secondary | ICD-10-CM | POA: Insufficient documentation

## 2019-08-19 HISTORY — PX: INSERTION OF ANTERIOR SEGMENT AQUEOUS DRAINAGE DEVICE (ISTENT): SHX6783

## 2019-08-19 HISTORY — PX: CATARACT EXTRACTION W/PHACO: SHX586

## 2019-08-19 LAB — GLUCOSE, CAPILLARY: Glucose-Capillary: 162 mg/dL — ABNORMAL HIGH (ref 70–99)

## 2019-08-19 SURGERY — PHACOEMULSIFICATION, CATARACT, WITH IOL INSERTION
Anesthesia: Monitor Anesthesia Care | Site: Eye | Laterality: Right

## 2019-08-19 MED ORDER — PROMETHAZINE HCL 25 MG/ML IJ SOLN: 6.2500 mg | INTRAMUSCULAR | Status: DC | PRN

## 2019-08-19 MED ORDER — LACTATED RINGERS IV SOLN: INTRAVENOUS | Status: DC

## 2019-08-19 MED ORDER — HYDROCODONE-ACETAMINOPHEN 7.5-325 MG PO TABS: 1.0000 | ORAL_TABLET | Freq: Once | ORAL | Status: DC | PRN

## 2019-08-19 MED ORDER — FENTANYL CITRATE (PF) 100 MCG/2ML IJ SOLN
INTRAMUSCULAR | Status: AC
  Filled 2019-08-19: qty 2

## 2019-08-19 MED ORDER — FENTANYL CITRATE (PF) 100 MCG/2ML IJ SOLN
INTRAMUSCULAR | Status: DC | PRN
  Administered 2019-08-19: 100 ug via INTRAVENOUS

## 2019-08-19 MED ORDER — BSS IO SOLN
INTRAOCULAR | Status: DC | PRN
  Administered 2019-08-19: 15 mL via INTRAOCULAR

## 2019-08-19 MED ORDER — CYCLOPENTOLATE-PHENYLEPHRINE 0.2-1 % OP SOLN
1.0000 [drp] | OPHTHALMIC | Status: AC | PRN
  Administered 2019-08-19 (×3): 1 [drp] via OPHTHALMIC

## 2019-08-19 MED ORDER — PROVISC 10 MG/ML IO SOLN
INTRAOCULAR | Status: DC | PRN
  Administered 2019-08-19: 0.85 mL via INTRAOCULAR

## 2019-08-19 MED ORDER — SODIUM HYALURONATE 23 MG/ML IO SOLN
INTRAOCULAR | Status: DC | PRN
  Administered 2019-08-19: 0.6 mL via INTRAOCULAR

## 2019-08-19 MED ORDER — MIDAZOLAM HCL 2 MG/2ML IJ SOLN: 0.5000 mg | Freq: Once | INTRAMUSCULAR | Status: DC | PRN

## 2019-08-19 MED ORDER — HYDROMORPHONE HCL 1 MG/ML IJ SOLN: 0.2500 mg | INTRAMUSCULAR | Status: DC | PRN

## 2019-08-19 MED ORDER — POVIDONE-IODINE 5 % OP SOLN
OPHTHALMIC | Status: DC | PRN
  Administered 2019-08-19: 1 via OPHTHALMIC

## 2019-08-19 MED ORDER — EPINEPHRINE PF 1 MG/ML IJ SOLN
INTRAOCULAR | Status: DC | PRN
  Administered 2019-08-19: 500 mL

## 2019-08-19 MED ORDER — PHENYLEPHRINE HCL 2.5 % OP SOLN
1.0000 [drp] | OPHTHALMIC | Status: AC | PRN
  Administered 2019-08-19 (×3): 1 [drp] via OPHTHALMIC

## 2019-08-19 MED ORDER — LIDOCAINE HCL 3.5 % OP GEL
1.0000 "application " | Freq: Once | OPHTHALMIC | Status: AC
  Administered 2019-08-19: 1 via OPHTHALMIC

## 2019-08-19 MED ORDER — EPINEPHRINE PF 1 MG/ML IJ SOLN
INTRAMUSCULAR | Status: AC
  Filled 2019-08-19: qty 2

## 2019-08-19 MED ORDER — TETRACAINE HCL 0.5 % OP SOLN
1.0000 [drp] | OPHTHALMIC | Status: AC | PRN
  Administered 2019-08-19 (×3): 1 [drp] via OPHTHALMIC

## 2019-08-19 MED ORDER — LIDOCAINE HCL (PF) 1 % IJ SOLN
INTRAOCULAR | Status: DC | PRN
  Administered 2019-08-19: .3 mL via OPHTHALMIC
  Administered 2019-08-19: .6 mL via OPHTHALMIC

## 2019-08-19 SURGICAL SUPPLY — 15 items
CLOTH BEACON ORANGE TIMEOUT ST (SAFETY) ×2 IMPLANT
DEVICE INJECT ISTENT W (Stent) IMPLANT
EYE SHIELD UNIVERSAL CLEAR (GAUZE/BANDAGES/DRESSINGS) ×2 IMPLANT
GLOVE BIOGEL PI IND STRL 7.0 (GLOVE) IMPLANT
GLOVE BIOGEL PI INDICATOR 7.0 (GLOVE) ×4
INJECT ISTENT W (Stent) ×3 IMPLANT
LENS ALC ACRYL/TECN (Ophthalmic Related) ×2 IMPLANT
NDL HYPO 18GX1.5 BLUNT FILL (NEEDLE) IMPLANT
NEEDLE HYPO 18GX1.5 BLUNT FILL (NEEDLE) ×3 IMPLANT
PAD ARMBOARD 7.5X6 YLW CONV (MISCELLANEOUS) ×2 IMPLANT
SYR TB 1ML LL NO SAFETY (SYRINGE) ×2 IMPLANT
TAPE SURG TRANSPORE 1 IN (GAUZE/BANDAGES/DRESSINGS) IMPLANT
TAPE SURGICAL TRANSPORE 1 IN (GAUZE/BANDAGES/DRESSINGS) ×2
VISCOELASTIC ADDITIONAL (OPHTHALMIC RELATED) ×2 IMPLANT
WATER STERILE IRR 250ML POUR (IV SOLUTION) ×2 IMPLANT

## 2019-08-19 NOTE — Discharge Instructions (Addendum)
PATIENT INSTRUCTIONS POST-ANESTHESIA  IMMEDIATELY FOLLOWING SURGERY:  Do not drive or operate machinery for the first twenty four hours after surgery.  Do not make any important decisions for twenty four hours after surgery or while taking narcotic pain medications or sedatives.  If you develop intractable nausea and vomiting or a severe headache please notify your doctor immediately.  FOLLOW-UP:  Please make an appointment with your surgeon as instructed. You do not need to follow up with anesthesia unless specifically instructed to do so.  WOUND CARE INSTRUCTIONS (if applicable):  Keep a dry clean dressing on the anesthesia/puncture wound site if there is drainage.  Once the wound has quit draining you may leave it open to air.  Generally you should leave the bandage intact for twenty four hours unless there is drainage.  If the epidural site drains for more than 36-48 hours please call the anesthesia department.  QUESTIONS?:  Please feel free to call your physician or the hospital operator if you have any questions, and they will be happy to assist you.      Please discharge patient when stable, will follow up today with Dr. Wrzosek at the [facility] office immediately following discharge.  Leave shield in place until visit.  All paperwork with discharge instructions will be given at the office.  [facility] Address:  [address]  [place], [place] [address]  

## 2019-08-19 NOTE — Op Note (Signed)
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 Stamatia Masri, MD, MA  Anesthesia: MAC, Topical Akten  Complications: None  Estimated Blood Loss: <82m (minimal)  Specimens: None  Implants: As above  Indications:  Visually significant age-related cataract, Right Eye  Procedure:  The patient was seen and identified in the pre-operative area. The operative eye was identified and dilated.  The operative eye was marked.  Topical anesthesia was administered to the operative eye.     The patient was then to the operative suite and placed in the supine position.  A timeout was performed confirming the patient, procedure to be performed, and all other relevant information.   The patient's face was prepped and draped in the usual fashion for intra-ocular surgery.  A lid speculum was placed into the operative eye and the surgical microscope moved into place and focused.  A superotemporal paracentesis was created using a 20 gauge paracentesis blade.  Shugarcaine was injected into the anterior chamber.  Viscoelastic was injected into the anterior chamber.  A temporal clear-corneal main wound incision was created using a 2.468mmicrokeratome.  A continuous curvilinear capsulorrhexis was initiated using an irrigating cystitome and completed using capsulorrhexis forceps.  Hydrodissection and hydrodeliniation were performed.  Viscoelastic was injected  into the anterior chamber.  A phacoemulsification handpiece and a chopper as a second instrument were used to remove the nucleus and epinucleus. The irrigation/aspiration handpiece was used to remove any remaining cortical material.   The capsular bag was reinflated with viscoelastic, checked, and found to be intact.  The intraocular lens was inserted into the capsular bag and dialed into place using a Kuglen hook.  The patient's head was repositioned.  iStent inject (2 stents) were placed 2 clock hours apart in the nasal trabecular meshwork without complication.  The patient's head was returned to neutral.  The irrigation/aspiration handpiece was used to remove any remaining viscoelastic.  The clear corneal wound and paracentesis wounds were then hydrated and checked with Weck-Cels to be watertight.  The lid-speculum and drape was removed, and the patient's face was cleaned with a wet and dry 4x4.  Maxitrol was instilled in the eye before a clear shield was taped over the eye. The patient was taken to the post-operative care unit in good condition, having tolerated the procedure well.  Post-Op Instructions: [facility] and will receive all other orders and instructions.

## 2019-08-19 NOTE — Anesthesia Preprocedure Evaluation (Addendum)
Anesthesia Evaluation  Patient identified by MRN, date of birth, ID band Patient awake    Reviewed: Allergy & Precautions, NPO status , Patient's Chart, lab work & pertinent test results  Airway Mallampati: II  TM Distance: >3 FB Neck ROM: Full    Dental no notable dental hx. (+) Edentulous Lower, Edentulous Upper   Pulmonary COPD,  COPD inhaler,    Pulmonary exam normal breath sounds clear to auscultation       Cardiovascular Exercise Tolerance: Good hypertension, Pt. on medications negative cardio ROS Normal cardiovascular examI Rhythm:Regular Rate:Normal     Neuro/Psych  Headaches, Seizures -, Well Controlled,  Anxiety Depression negative psych ROS   GI/Hepatic Neg liver ROS, GERD  Medicated and Controlled,  Endo/Other  negative endocrine ROSdiabetes  Renal/GU negative Renal ROS  negative genitourinary   Musculoskeletal negative musculoskeletal ROS (+)   Abdominal   Peds negative pediatric ROS (+)  Hematology negative hematology ROS (+) No recent H/H   Anesthesia Other Findings   Reproductive/Obstetrics negative OB ROS                            Anesthesia Physical Anesthesia Plan  ASA: III  Anesthesia Plan: MAC   Post-op Pain Management:    Induction: Intravenous  PONV Risk Score and Plan: 2 and Propofol infusion and TIVA  Airway Management Planned: Simple Face Mask and Nasal Cannula  Additional Equipment:   Intra-op Plan:   Post-operative Plan:   Informed Consent: I have reviewed the patients History and Physical, chart, labs and discussed the procedure including the risks, benefits and alternatives for the proposed anesthesia with the patient or authorized representative who has indicated his/her understanding and acceptance.     Dental advisory given  Plan Discussed with: CRNA  Anesthesia Plan Comments: (Plan Full PPE use  Plan MAC d/w pt -WTP with same after  Q&A  Pt s/p first IOL 07/24/19 Wants similar to last )        Anesthesia Quick Evaluation

## 2019-08-19 NOTE — Interval H&P Note (Signed)
History and Physical Interval Note: The H and P was reviewed and updated. The patient was examined.  No changes were found after exam.  The surgical eye was marked.  08/19/2019 11:08 AM  Carrie Turner  has presented today for surgery, with the diagnosis of Nuclear sclerotic cataract - Right eye Chronic open angle glaucoma - Right eye.  The various methods of treatment have been discussed with the patient and family. After consideration of risks, benefits and other options for treatment, the patient has consented to  Procedure(s) with comments: CATARACT EXTRACTION PHACO AND INTRAOCULAR LENS PLACEMENT (IOC) (Right) - right INSERTION OF ANTERIOR SEGMENT AQUEOUS DRAINAGE DEVICE (ISTENT) (Right) as a surgical intervention.  The patient's history has been reviewed, patient examined, no change in status, stable for surgery.  I have reviewed the patient's chart and labs.  Questions were answered to the patient's satisfaction.     Baruch Goldmann

## 2019-08-19 NOTE — Anesthesia Postprocedure Evaluation (Signed)
Anesthesia Post Note  Patient: Carrie Turner  Procedure(s) Performed: CATARACT EXTRACTION PHACO AND INTRAOCULAR LENS PLACEMENT RIGHT EYE (Right Eye) INSERTION OF ANTERIOR SEGMENT AQUEOUS DRAINAGE DEVICE (ISTENT) RIGHT EYE (Right Eye)  Patient location during evaluation: PACU Anesthesia Type: MAC Level of consciousness: awake, awake and alert, patient cooperative and oriented Pain management: pain level controlled Vital Signs Assessment: post-procedure vital signs reviewed and stable Respiratory status: spontaneous breathing, nonlabored ventilation and respiratory function stable Cardiovascular status: stable Postop Assessment: no apparent nausea or vomiting Anesthetic complications: no     Last Vitals:  Vitals:   08/19/19 1003  BP: 121/67  Pulse: 82  Resp: 20  Temp: 36.4 C  SpO2: 98%    Last Pain:  Vitals:   08/19/19 1003  TempSrc: Oral  PainSc:                  Willa Rough

## 2019-08-19 NOTE — Transfer of Care (Signed)
Immediate Anesthesia Transfer of Care Note  Patient: Carrie Turner  Procedure(s) Performed: CATARACT EXTRACTION PHACO AND INTRAOCULAR LENS PLACEMENT RIGHT EYE (Right Eye) INSERTION OF ANTERIOR SEGMENT AQUEOUS DRAINAGE DEVICE (ISTENT) RIGHT EYE (Right Eye)  Patient Location: PACU  Anesthesia Type:MAC  Level of Consciousness: awake, alert  and oriented  Airway & Oxygen Therapy: Patient Spontanous Breathing  Post-op Assessment: Report given to RN and Post -op Vital signs reviewed and stable  Post vital signs: Reviewed and stable  Last Vitals:  Vitals Value Taken Time  BP    Temp    Pulse    Resp    SpO2      Last Pain:  Vitals:   08/19/19 1003  TempSrc: Oral  PainSc:       Patients Stated Pain Goal: 8 (28/41/32 [id number])  Complications: No apparent anesthesia complications

## 2019-08-20 DIAGNOSIS — E1143 Type 2 diabetes mellitus with diabetic autonomic (poly)neuropathy: Secondary | ICD-10-CM | POA: Diagnosis not present

## 2019-08-20 DIAGNOSIS — R350 Frequency of micturition: Secondary | ICD-10-CM | POA: Diagnosis not present

## 2019-08-20 DIAGNOSIS — Z789 Other specified health status: Secondary | ICD-10-CM | POA: Diagnosis not present

## 2019-08-20 DIAGNOSIS — E1165 Type 2 diabetes mellitus with hyperglycemia: Secondary | ICD-10-CM | POA: Diagnosis not present

## 2019-08-20 DIAGNOSIS — Z299 Encounter for prophylactic measures, unspecified: Secondary | ICD-10-CM | POA: Diagnosis not present

## 2019-08-20 DIAGNOSIS — I10 Essential (primary) hypertension: Secondary | ICD-10-CM | POA: Diagnosis not present

## 2019-08-27 ENCOUNTER — Other Ambulatory Visit: Payer: Self-pay

## 2019-08-27 ENCOUNTER — Ambulatory Visit (HOSPITAL_COMMUNITY)
Admission: RE | Admit: 2019-08-27 | Discharge: 2019-08-27 | Disposition: A | Discharge status: Home / Self Care | Payer: Medicare Other | Source: Ambulatory Visit | Attending: Gastroenterology | Admitting: Gastroenterology

## 2019-08-27 DIAGNOSIS — R112 Nausea with vomiting, unspecified: Secondary | ICD-10-CM | POA: Diagnosis not present

## 2019-08-27 DIAGNOSIS — R109 Unspecified abdominal pain: Secondary | ICD-10-CM | POA: Diagnosis not present

## 2019-08-27 MED ORDER — TECHNETIUM TC 99M SULFUR COLLOID
2.0000 | Freq: Once | INTRAVENOUS | Status: AC | PRN
  Administered 2019-08-27: 10:00:00 2 via INTRAVENOUS

## 2019-08-28 ENCOUNTER — Other Ambulatory Visit (HOSPITAL_COMMUNITY): Payer: Medicare Other

## 2019-08-29 DIAGNOSIS — R413 Other amnesia: Secondary | ICD-10-CM | POA: Diagnosis not present

## 2019-09-12 ENCOUNTER — Ambulatory Visit: Payer: Medicare Other | Admitting: "Endocrinology

## 2019-09-13 ENCOUNTER — Other Ambulatory Visit: Payer: Self-pay | Admitting: "Endocrinology

## 2019-09-26 ENCOUNTER — Encounter: Payer: Medicare Other | Attending: Internal Medicine | Admitting: Nutrition

## 2019-09-26 ENCOUNTER — Ambulatory Visit (INDEPENDENT_AMBULATORY_CARE_PROVIDER_SITE_OTHER): Payer: Medicare Other | Admitting: "Endocrinology

## 2019-09-26 ENCOUNTER — Other Ambulatory Visit: Payer: Self-pay

## 2019-09-26 ENCOUNTER — Encounter: Payer: Self-pay | Admitting: Nutrition

## 2019-09-26 ENCOUNTER — Encounter: Payer: Self-pay | Admitting: "Endocrinology

## 2019-09-26 VITALS — Ht 62.0 in | Wt 199.0 lb

## 2019-09-26 VITALS — BP 126/79 | HR 96 | Ht 62.0 in | Wt 199.0 lb

## 2019-09-26 DIAGNOSIS — E118 Type 2 diabetes mellitus with unspecified complications: Secondary | ICD-10-CM

## 2019-09-26 DIAGNOSIS — I10 Essential (primary) hypertension: Secondary | ICD-10-CM

## 2019-09-26 DIAGNOSIS — E782 Mixed hyperlipidemia: Secondary | ICD-10-CM | POA: Diagnosis not present

## 2019-09-26 DIAGNOSIS — IMO0002 Reserved for concepts with insufficient information to code with codable children: Secondary | ICD-10-CM

## 2019-09-26 DIAGNOSIS — E1165 Type 2 diabetes mellitus with hyperglycemia: Secondary | ICD-10-CM

## 2019-09-26 DIAGNOSIS — Z794 Long term (current) use of insulin: Secondary | ICD-10-CM

## 2019-09-26 DIAGNOSIS — Z299 Encounter for prophylactic measures, unspecified: Secondary | ICD-10-CM | POA: Diagnosis not present

## 2019-09-26 LAB — POCT GLYCOSYLATED HEMOGLOBIN (HGB A1C): Hemoglobin A1C: 9.2 % — AB (ref 4.0–5.6)

## 2019-09-26 MED ORDER — INSULIN LISPRO (1 UNIT DIAL) 100 UNIT/ML (KWIKPEN): 20.0000 [IU] | PEN_INJECTOR | Freq: Three times a day (TID) | SUBCUTANEOUS | 0 refills | Status: DC

## 2019-09-26 MED ORDER — LEVEMIR FLEXTOUCH 100 UNIT/ML ~~LOC~~ SOPN: 60.0000 [IU] | PEN_INJECTOR | Freq: Two times a day (BID) | SUBCUTANEOUS | 2 refills | Status: DC

## 2019-09-26 NOTE — Patient Instructions (Signed)
Goals Follow My Plate Eat meals on time Avoid snacks between meals Start back walking 30 minutes a day Drink only water and you can put lemon or vinegar in it if you desire. Don't eat past 7 pm. Lose 2-3 lbs per month. Increase lower carb vegetables.

## 2019-09-26 NOTE — Patient Instructions (Signed)

## 2019-09-26 NOTE — Progress Notes (Signed)
09/26/2019   Endocrinology follow-up note  Subjective:    Patient ID: Carrie Turner, female    DOB: [birth year].  She is being seen in follow-up  for management of diabetes requested by  Monico Blitz, MD  Past Medical History:  Diagnosis Date  . Anxiety   . COPD (chronic obstructive pulmonary disease) ([place])   . Depression   . Diabetes mellitus, type II ([place])   . GERD (gastroesophageal reflux disease)   . Glaucoma   . Hyperlipidemia   . Hypertension   . Migraines   . Neuropathy   . Seizures ([place])    seizure was from ETOH, "a long time ago", no med and no recurrance  . Vitamin D deficiency     Past Surgical History:  Procedure Laterality Date  . ABDOMINAL HYSTERECTOMY    . CATARACT EXTRACTION W/PHACO Left 07/24/2019   Procedure: CATARACT EXTRACTION PHACO AND INTRAOCULAR LENS PLACEMENT LEFT EYE  (CDE: 5.60);  Surgeon: Baruch Goldmann, MD;  Location: [facility];  Service: Ophthalmology;  Laterality: Left;  . CATARACT EXTRACTION W/PHACO Right 08/19/2019   Procedure: CATARACT EXTRACTION PHACO AND INTRAOCULAR LENS PLACEMENT RIGHT EYE;  Surgeon: Baruch Goldmann, MD;  Location: [facility];  Service: Ophthalmology;  Laterality: Right;  CDE: 8.83  . COLONOSCOPY  11/2015   Dr. Britta Mccreedy: sessile polyp removed (benign). advised repeat colonoscopy in 5 years.   . COLONOSCOPY WITH PROPOFOL N/A 10/26/2017   RMR: Tubular adenoma removed.  Random colon biopsies negative.  Nonbleeding internal hemorrhoids.  Next colonoscopy in 5 years.  . ESOPHAGEAL DILATION  10/26/2017   Procedure: ESOPHAGEAL DILATION;  Surgeon: Daneil Dolin, MD;  Location: [facility];  Service: Endoscopy;;  . ESOPHAGOGASTRODUODENOSCOPY  11/2015   Dr. Britta Mccreedy: hiatal hernia  . ESOPHAGOGASTRODUODENOSCOPY (EGD) WITH PROPOFOL N/A 10/26/2017   RMR: Erosive reflux esophagitis, hiatal hernia.  Small bowel biopsies negative.  . INSERTION OF ANTERIOR SEGMENT AQUEOUS DRAINAGE DEVICE (ISTENT) Left 07/24/2019   Procedure: INSERTION OF ANTERIOR  SEGMENT AQUEOUS DRAINAGE DEVICE (ISTENT) LEFT EYE;  Surgeon: Baruch Goldmann, MD;  Location: [facility];  Service: Ophthalmology;  Laterality: Left;  . INSERTION OF ANTERIOR SEGMENT AQUEOUS DRAINAGE DEVICE (ISTENT) Right 08/19/2019   Procedure: INSERTION OF ANTERIOR SEGMENT AQUEOUS DRAINAGE DEVICE (ISTENT) RIGHT EYE;  Surgeon: Baruch Goldmann, MD;  Location: [facility];  Service: Ophthalmology;  Laterality: Right;  . KNEE ARTHROSCOPY Right   . ORIF ANKLE FRACTURE Right      Social History   Socioeconomic History  . Marital status: Single    Spouse name: Not on file  . Number of children: Not on file  . Years of education: Not on file  . Highest education level: Not on file  Occupational History  . Not on file  Tobacco Use  . Smoking status: Never Smoker  . Smokeless tobacco: Never Used  Substance and Sexual Activity  . Alcohol use: No  . Drug use: No  . Sexual activity: Never  Other Topics Concern  . Not on file  Social History Narrative  . Not on file   Social Determinants of Health   Financial Resource Strain:   . Difficulty of Paying Living Expenses: Not on file  Food Insecurity:   . Worried About Charity fundraiser in the Last Year: Not on file  . Ran Out of Food in the Last Year: Not on file  Transportation Needs:   . Lack of Transportation (Medical): Not on file  . Lack of Transportation (Non-Medical): Not on file  Physical  Activity:   . Days of Exercise per Week: Not on file  . Minutes of Exercise per Session: Not on file  Stress:   . Feeling of Stress : Not on file  Social Connections:   . Frequency of Communication with Friends and Family: Not on file  . Frequency of Social Gatherings with Friends and Family: Not on file  . Attends Religious Services: Not on file  . Active Member of Clubs or Organizations: Not on file  . Attends Archivist Meetings: Not on file  . Marital Status: Not on file  Intimate Partner Violence:   . Fear of Current or Ex-Partner:  Not on file  . Emotionally Abused: Not on file  . Physically Abused: Not on file  . Sexually Abused: Not on file    Current Outpatient Medications on File Prior to Visit  Medication Sig Dispense Refill  . acetaminophen (TYLENOL) 500 MG tablet Take 1,000 mg by mouth every 6 (six) hours as needed (for pain/headaches.).    Marland Kitchen albuterol (VENTOLIN HFA) 108 (90 Base) MCG/ACT inhaler Inhale 1-2 puffs into the lungs every 6 (six) hours as needed for wheezing or shortness of breath.    Marland Kitchen amLODipine (NORVASC) 2.5 MG tablet Take 2.5 mg by mouth daily.    Marland Kitchen aspirin EC 81 MG tablet Take 81 mg by mouth daily.    . Blood Glucose Monitoring Suppl (ONETOUCH VERIO) w/Device KIT 1 each by Does not apply route 4 (four) times daily. E11.65 1 kit 0  . Calcium Carb-Cholecalciferol (CALCIUM 500 + D3 PO) Take 1 tablet by mouth daily.    . Cholecalciferol (VITAMIN D3) 2000 units TABS Take 2,000 Units by mouth daily.    . citalopram (CELEXA) 40 MG tablet Take 40 mg by mouth daily.    . Continuous Blood Gluc Sensor (FREESTYLE LIBRE 14 DAY SENSOR) MISC Inject 1 each into the skin every 14 (fourteen) days. Use as directed. 2 each 2  . dexlansoprazole (DEXILANT) 60 MG capsule Take 1 capsule (60 mg total) by mouth daily. 90 capsule 3  . docusate sodium (COLACE) 100 MG capsule Take 100 mg by mouth 2 (two) times daily as needed for mild constipation.    . eszopiclone (LUNESTA) 2 MG TABS tablet Take 2 mg by mouth at bedtime as needed for sleep. Take immediately before bedtime    . fenofibrate (TRICOR) 145 MG tablet TAKE 1 TABLET ONCE DAILY. 30 tablet 0  . gabapentin (NEURONTIN) 800 MG tablet Take 800 mg by mouth 4 (four) times daily.     Marland Kitchen glipiZIDE (GLUCOTROL XL) 2.5 MG 24 hr tablet Take 2 tablets (5 mg total) by mouth daily with breakfast. 30 tablet 0  . Insulin Pen Needle (PEN NEEDLES) 31G X 6 MM MISC 1 each by Does not apply route 4 (four) times daily. 400 each 1  . lisinopril-hydrochlorothiazide (PRINZIDE,ZESTORETIC)  20-12.5 MG tablet Take 2 tablets by mouth daily.     Marland Kitchen lubiprostone (AMITIZA) 8 MCG capsule Take 1 capsule (8 mcg total) by mouth 2 (two) times daily with a meal. 60 capsule 3  . ondansetron (ZOFRAN) 8 MG tablet Take 1 tablet (8 mg total) by mouth every 8 (eight) hours as needed for nausea or vomiting. 30 tablet 3  . ONETOUCH VERIO test strip USE 1 STRIP TO CHECK GLUCOSE 4 TIMES DAILY 150 each 5  . pravastatin (PRAVACHOL) 40 MG tablet Take 40 mg by mouth daily.    Marland Kitchen pyridOXINE (VITAMIN B-6) 100 MG tablet Take 100  mg by mouth daily.    . sucralfate (CARAFATE) 1 g tablet Take 1 tablet (1 g total) by mouth 4 (four) times daily as needed (crush and mix with water as needed for upset stomach). 60 tablet 0  . vitamin B-12 (CYANOCOBALAMIN) 500 MCG tablet Take 500 mcg by mouth daily.     No current facility-administered medications on file prior to visit.    No facility-administered encounter medications on file as of 10/16/2018.    Allergies  Allergen Reactions  . Sulfa Antibiotics Other (See Comments)    Weakness and fatigued      VACCINATION STATUS:  There is no immunization history on file for this patient.  Diabetes  She presents for her follow-up diabetic visit. She has type 2 diabetes mellitus. Onset time: She was diagnosed at approximate age of 62 years. Her disease course has been fluctuating, recently showing some improvement.  She presents with significantly above target glycemic profile both fasting and postprandial despite her current large dose insulin.   She has no hypoglycemia documented or reported.  -Pertinent negatives for hypoglycemia include no confusion, headaches, pallor or seizures. Associated symptoms include fatigue, polydipsia and polyuria. Pertinent negatives for diabetes include no blurred vision, no chest pain and no polyphagia. There are no hypoglycemic complications.   There are no diabetic complications. Risk factors for coronary artery disease include diabetes  mellitus, dyslipidemia, obesity and sedentary lifestyle.  -Her point-of-care A1c is 9.2%, unchanged from her last visit, also improved from 11%    She is following a generally unhealthy diet. When asked about meal planning, she reported none. She has had a previous visit with a dietitian (She will see the dietitian today.). She never participates in exercise.   ) An ACE inhibitor/angiotensin II receptor blocker is not being taken. Eye exam is current.    Hyperlipidemia  This is a chronic problem. The current episode started more than 1 year ago. Recent lipid tests were reviewed and are high. Exacerbating diseases include diabetes and obesity. Pertinent negatives include no chest pain, myalgias or shortness of breath. She is currently on no antihyperlipidemic treatment. Compliance problems include adherence to diet and adherence to exercise.  Risk factors for coronary artery disease include diabetes mellitus, dyslipidemia, hypertension, obesity and a sedentary lifestyle.     Objective:    Recent Results (from the past 2160 hour(s))  SARS CORONAVIRUS 2 (TAT 6-24 HRS) Nasopharyngeal Nasopharyngeal Swab     Status: None   Collection Time: 07/22/19  7:10 AM   Specimen: Nasopharyngeal Swab  Result Value Ref Range   SARS Coronavirus 2 NEGATIVE NEGATIVE    Comment: (NOTE) SARS-CoV-2 target nucleic acids are NOT DETECTED. The SARS-CoV-2 RNA is generally detectable in upper and lower respiratory specimens during the acute phase of infection. Negative results do not preclude SARS-CoV-2 infection, do not rule out co-infections with other pathogens, and should not be used as the sole basis for treatment or other patient management decisions. Negative results must be combined with clinical observations, patient history, and epidemiological information. The expected result is Negative. Fact Sheet for Patients: SugarRoll.be Fact Sheet for Healthcare  Providers: https://www.woods-mathews.com/ This test is not yet approved or cleared by the [place] FDA and  has been authorized for detection and/or diagnosis of SARS-CoV-2 by FDA under an Emergency Use Authorization (EUA). This EUA will remain  in effect (meaning this test can be used) for the duration of the COVID-19 declaration under Section 56 4(b)(1) of the Act, 21 U.S.C. section 360bbb-3(b)(1),  unless the authorization is terminated or revoked sooner. Performed at [facility], [place] [address], [place], [place] [address]   Glucose, capillary     Status: Abnormal   Collection Time: 07/24/19  8:59 AM  Result Value Ref Range   Glucose-Capillary 101 (H) 70 - 99 mg/dL  SARS CORONAVIRUS 2 (TAT 6-24 HRS) Nasopharyngeal Nasopharyngeal Swab     Status: None   Collection Time: 08/16/19  7:10 AM   Specimen: Nasopharyngeal Swab  Result Value Ref Range   SARS Coronavirus 2 NEGATIVE NEGATIVE    Comment: (NOTE) SARS-CoV-2 target nucleic acids are NOT DETECTED. The SARS-CoV-2 RNA is generally detectable in upper and lower respiratory specimens during the acute phase of infection. Negative results do not preclude SARS-CoV-2 infection, do not rule out co-infections with other pathogens, and should not be used as the sole basis for treatment or other patient management decisions. Negative results must be combined with clinical observations, patient history, and epidemiological information. The expected result is Negative. Fact Sheet for Patients: SugarRoll.be Fact Sheet for Healthcare Providers: https://www.woods-mathews.com/ This test is not yet approved or cleared by the [place] FDA and  has been authorized for detection and/or diagnosis of SARS-CoV-2 by FDA under an Emergency Use Authorization (EUA). This EUA will remain  in effect (meaning this test can be used) for the duration of the COVID-19 declaration under Section  56 4(b)(1) of the Act, 21 U.S.C. section 360bbb-3(b)(1), unless the authorization is terminated or revoked sooner. Performed at [facility], [place] [address], [place], [place] [address]   Glucose, capillary     Status: Abnormal   Collection Time: 08/19/19 10:13 AM  Result Value Ref Range   Glucose-Capillary 162 (H) 70 - 99 mg/dL  HgB A1c     Status: Abnormal   Collection Time: 09/26/19  9:39 AM  Result Value Ref Range   Hemoglobin A1C 9.2 (A) 4.0 - 5.6 %   HbA1c POC (<> result, manual entry)     HbA1c, POC (prediabetic range)     HbA1c, POC (controlled diabetic range)      Labs from June 18, 2018 showed BUN 8 creatinine 051, A1c 11% Labs from January 24, 2018 showed BUN 9,  Creatinine 0.58, A1c 10.7% September 20, 2017 A1c 10.5%  Lipid Panel     Component Value Date/Time   CHOL 372 (A) 02/06/2019 0000   TRIG 668 (A) 02/06/2019 0000   HDL 56 02/06/2019 0000     Assessment & Plan:   1. Uncontrolled type 2 diabetes mellitus with complication, with long-term current use of insulin ([place])  - Patient has currently uncontrolled symptomatic type 2 DM since  69 years of age. -She presents with severely uncontrolled glycemic profile despite high-dose insulin.  Point-of-care A1c is 9.2%, unchanged from her last visit A1c.     - Recent labs are reviewed, showing normal renal function.   Her diabetes is complicated by noncompliance/nonadherence, obesity and sedentary life and patient remains at a high risk for more acute and chronic complications of diabetes which include CAD, CVA, CKD, retinopathy, and neuropathy. These are all discussed in detail with the patient.  - she  admits there is a room for improvement in her diet and drink choices. -  Suggestion is made for her to avoid simple carbohydrates  from her diet including Cakes, Sweet Desserts / Pastries, Ice Cream, Soda (diet and regular), Sweet Tea, Candies, Chips, Cookies, Sweet Pastries,  Store Bought Juices, Alcohol in  Excess of  1-2 drinks a day, Artificial Sweeteners, Coffee Creamer, and "Sugar-free" Products. This will help patient to have stable blood glucose profile and potentially avoid unintended weight gain.  - I have approached patient with the following individualized plan to manage diabetes and patient agrees:   -she is struggling to achieve control of diabetes, likely deals with moderate cognitive deficit.    -She we will continue to require intensive treatment with basal/bolus insulin in order for her to achieve control of diabetes to target.    -She has questionable compliance and cognitive ability, her treatment will be adjusted slowly.   -She is advised to increase her Levemir to 60 units twice daily, increase Humalog to 20  units 3 times daily  before meals for pre-meal BG readings of 90 -137m/dl, plus patient specific correction dose for unexpected hyperglycemia above 1569mdl, associated with strict monitoring of glucose 4 times a day-before meals and at bedtime.  -She will be considered for insulin U500 on subsequent visits for simplicity reasons. - Patient is warned not to take insulin without proper monitoring per orders.  -Patient is encouraged to call clinic for blood glucose levels less than 70 or above 300 mg /dl.  She did not tolerate retrial of low-dose metformin.  She is advised to discontinue.    -She has tolerated and benefited from low-dose glipizide.  She is advised to continue glipizide 5 mg p.o.  at breakfast.   -She is not a reliable candidate for incretin therapy.    2) Lipids/HPL: She has severely uncontrolled hypertriglyceridemia, LDL not calculated.  She is advised to continue pravastatin 40 mg p.o. nightly, advised to eliminate fried food and butter from her diet.  She is also on fenofibrate 145 mg p.o. nightly.   3) hypertension: Her blood pressure is controlled to target. She is advised to continue follow-up lisinopril/hydrochlorothiazide 20/12.5 mg  daily.   She is advised to continue follow-up closely with Dr. ShManuella Ghazior primary care needs.  - Time spent on this patient care encounter:  35 min, of which > 50% was spent in  counseling and the rest reviewing her blood glucose logs , discussing her hypoglycemia and hyperglycemia episodes, reviewing her current and  previous labs / studies  ( including abstraction from other facilities) and medications  doses and developing a  long term treatment plan and documenting her care.   Please refer to Patient Instructions for Blood Glucose Monitoring and Insulin/Medications Dosing Guide"  in media tab for additional information. Please  also refer to " Patient Self Inventory" in the Media  tab for reviewed elements of pertinent patient history.  EvOrlena Sheldonarticipated in the discussions, expressed understanding, and voiced agreement with the above plans.  All questions were answered to her satisfaction. she is encouraged to contact clinic should she have any questions or concerns prior to her return visit.   Follow up plan: -Return in 3 months with labs, logs, and meter. GeGlade LloydMD Phone: [phone]: [phone]-  This note was partially dictated with voice recognition software. Similar sounding words can be transcribed inadequately or may not  be corrected upon review.  10/16/2018, 1:34 PM

## 2019-09-26 NOTE — Progress Notes (Signed)
Nutrition Therapy Visit Type:   Follow up  Appt. Start Time: 1000 Appt. End Time: [id number]  09/26/2019  [name], identified by name and date of birth, is a 69 y.o. female with a diagnosis of Diabetes Type 2. Sees Dr. Dorris Fetch, Endocrinology. PCP Dr. Manuella Ghazi    Gain 9 lbs.A1C UP TO 9.2 % from 9%. Has been stuck inside and hasn't been exercising. Admits to eating more and exericising less. Wants to get back to walking. Will work on eating meals on time and eating better. Dr. Dorris Fetch increased her Levemir to 60 units twice a day. Humalog 20 with meals plus sliding scale due to elevated blood sugars. Needs better engagement with eating and exercising and accuracy of insulin.   TEE Calories 1200 CHO 135 g PRO 90 Fat 33 g  Education: Nutrition and Diabetes education provided on My Plate, CHO counting, meal planning, portion sizes, timing of meals, avoiding snacks between meals unless having a low blood sugar, target ranges for A1C and blood sugars, signs/symptoms and treatment of hyper/hypoglycemia, monitoring blood sugars, taking medications as prescribed, benefits of exercising 30 minutes per day and prevention of complications of DM.   Lab Results  Component Value Date   HGBA1C 9.2 (A) 09/26/2019   CMP Latest Ref Rng & Units 06/04/2019 02/06/2019 10/05/2018  Glucose 65 - 99 mg/dL - - -  BUN 4 - 21 17 15 14   Creatinine 0.5 - 1.1 0.7 0.5 0.6  Sodium 137 - 147 135(A) - -  Potassium 3.4 - 5.3 3.8 - -  Chloride 99 - 108 95(A) - -  CO2 22 - 32 mmol/L - - -  Calcium 8.7 - 10.7 9.8 - -  Total Protein 6.1 - 8.1 g/dL - - -  Total Bilirubin 0.2 - 1.2 mg/dL - - -  Alkaline Phos 25 - 125 111 - -  AST 10 - 35 U/L - - -  ALT 6 - 29 U/L - - -       ASSESSMENT  Height 5\' 2"  (1.575 m), weight 199 lb (90.3 kg). Body mass index is 36.4 kg/m.    Eats 2-3 meals a day. Has gotten off track with meals and snacking. Feels like she knows what she is suppose to do.   Goals Follow My Plate Eat  meals on time Avoid snacks between meals Start back walking 30 minutes a day Drink only water and you can put lemon or vinegar in it if you desire. Don't eat past 7 pm. Lose 2-3 lbs per month. Increase lower carb vegetables.  Expected Outcomes:    Improved DM and reduced complications.  Education material provided: My Plate, Diabetes Instrucitons.  If problems or questions, patient to contact team via:  Phone and Email  MNT follow up    3 months.

## 2019-09-29 NOTE — Progress Notes (Signed)
Referring Provider: Monico Blitz, MD Primary Care Physician:  Monico Blitz, MD Primary GI Physician: Dr. Gala Romney  Chief Complaint  Patient presents with  . Abdominal Pain    Upper abd, started back today but had pain x 2 weeks per pt prior  . Gas  . Nausea    HPI:   Carrie Turner is a 69 y.o. female presenting today for abdominal pain and nausea.  History of GERD, constipation, nausea, epigastric pain.  Colonoscopy up-to-date in 2019 with single tubular adenoma, internal hemorrhoids, random colon biopsies negative.  Recommended 5-year surveillance.  EGD at that time with erosive reflux esophagitis, empiric dilation of esophagus with dysphagia, hiatal hernia, small bowel biopsies negative for celiac.  History of poorly controlled diabetes which has been felt to be contributing to her nausea.  Historically Linzess caused loose stools and incontinence.  Changed to Amitiza 8 mcg twice daily.  Zofran as needed for nausea.  She was last seen in our office on 07/31/2019 for follow-up of non-intractable nausea with vomiting, GERD, and constipation.  Nausea continued intermittently, vomiting worse with meals, yogurt, fried foods.  No significant heartburn with Dexilant.  BMs daily on Amitiza 8 mcg twice daily.  Overall frustrated with persistent nausea. Plans to proceed gastric emptying study to evaluate for gastroparesis.  Encouraged she continue pursuing weight loss and tight glycemic control.  Otherwise, continue current medications.  GES completed on 08/27/2019 revealed borderline delayed gastric emptying.  87% emptying at 4 hours (normal greater than 90%). Recommendations given for gastroparesis diet and advised to follow-up in 4 months.   Today:  Has been eating 5 small meals. Thinks her abdominal pain is related to her gallbladder. States her daughter has the same problems, saw Dr. Arnoldo Morale, had gallbladder removed and feels much better. Stated she had 2 weeks of constant upper abdominal pain  about 1 month ago. Had increased gas at that time. Abdominal pain resolved for about 2 weeks. Woke up with upper abdominal pain this morning. Had 2 BMs which helped ease the pain. Abdominal pain isn't usually worsened by eating. No right sided pain. Eats within 3 hours of laying down at times but didn't last night. Trying to follow a low fat diet. Eats fast foods once a week. Most vegetables are cooked. Eats vegetables daily. Few fruits.   Continues with intermittent nausea daily. Not specifically associated with abdominal pain. One episode of emesis during 2 weeks of pain.   GERD symptoms are well controlled. No dysphagia. No blood in the stool. No black stools. No unintentional weight loss. BMs daily. Sometimes two. Stools are complete. No straining or hard stools. Some abdominal pain in the upper abdomen prior to BMs that improves thereafter.   Most recent hemoglobin A1c 9.2 on 09/26/2019.  [facility] and with a nutritionist.   No fever, chills, cold or flu like symptoms, chest pain, heart palpitations. Some shortness of breath with exertion, none at rest, no cough. Will get shaky and feel she may pass out if sugar gets too low.   Only taking tylenol as needed. Also baby aspirin daily.  States PCP diagnosed her with dementia recently and she was started on Aircept.    Past Medical History:  Diagnosis Date  . Anxiety   . COPD (chronic obstructive pulmonary disease) ([place])   . Dementia ([place]) 09/30/2019   per patient, early dementia diagnosed after seeing PCP recently  . Depression   . Diabetes mellitus, type II ([place])   . GERD (gastroesophageal  reflux disease)   . Glaucoma   . Hyperlipidemia   . Hypertension   . Migraines   . Neuropathy   . Seizures ([place])    seizure was from ETOH, "a long time ago", no med and no recurrance  . Vitamin D deficiency     Past Surgical History:  Procedure Laterality Date  . ABDOMINAL HYSTERECTOMY    . CATARACT EXTRACTION W/PHACO Left  07/24/2019   Procedure: CATARACT EXTRACTION PHACO AND INTRAOCULAR LENS PLACEMENT LEFT EYE  (CDE: 5.60);  Surgeon: Baruch Goldmann, MD;  Location: [facility];  Service: Ophthalmology;  Laterality: Left;  . CATARACT EXTRACTION W/PHACO Right 08/19/2019   Procedure: CATARACT EXTRACTION PHACO AND INTRAOCULAR LENS PLACEMENT RIGHT EYE;  Surgeon: Baruch Goldmann, MD;  Location: [facility];  Service: Ophthalmology;  Laterality: Right;  CDE: 8.83  . COLONOSCOPY  11/2015   Dr. Britta Mccreedy: sessile polyp removed (benign). advised repeat colonoscopy in 5 years.   . COLONOSCOPY WITH PROPOFOL N/A 10/26/2017   RMR: Tubular adenoma removed.  Random colon biopsies negative.  Nonbleeding internal hemorrhoids.  Next colonoscopy in 5 years.  . ESOPHAGEAL DILATION  10/26/2017   Procedure: ESOPHAGEAL DILATION;  Surgeon: Daneil Dolin, MD;  Location: [facility];  Service: Endoscopy;;  . ESOPHAGOGASTRODUODENOSCOPY  11/2015   Dr. Britta Mccreedy: hiatal hernia  . ESOPHAGOGASTRODUODENOSCOPY (EGD) WITH PROPOFOL N/A 10/26/2017   RMR: Erosive reflux esophagitis, hiatal hernia.  Small bowel biopsies negative.  . INSERTION OF ANTERIOR SEGMENT AQUEOUS DRAINAGE DEVICE (ISTENT) Left 07/24/2019   Procedure: INSERTION OF ANTERIOR SEGMENT AQUEOUS DRAINAGE DEVICE (ISTENT) LEFT EYE;  Surgeon: Baruch Goldmann, MD;  Location: [facility];  Service: Ophthalmology;  Laterality: Left;  . INSERTION OF ANTERIOR SEGMENT AQUEOUS DRAINAGE DEVICE (ISTENT) Right 08/19/2019   Procedure: INSERTION OF ANTERIOR SEGMENT AQUEOUS DRAINAGE DEVICE (ISTENT) RIGHT EYE;  Surgeon: Baruch Goldmann, MD;  Location: [facility];  Service: Ophthalmology;  Laterality: Right;  . KNEE ARTHROSCOPY Right   . ORIF ANKLE FRACTURE Right     Current Outpatient Medications  Medication Sig Dispense Refill  . acetaminophen (TYLENOL) 500 MG tablet Take 1,000 mg by mouth every 6 (six) hours as needed (for pain/headaches.).    Marland Kitchen albuterol (VENTOLIN HFA) 108 (90 Base) MCG/ACT inhaler Inhale 1-2 puffs into the  lungs every 6 (six) hours as needed for wheezing or shortness of breath.    Marland Kitchen amLODipine (NORVASC) 2.5 MG tablet Take 2.5 mg by mouth daily.    Marland Kitchen aspirin EC 81 MG tablet Take 81 mg by mouth daily.    . Blood Glucose Monitoring Suppl (ONETOUCH VERIO) w/Device KIT 1 each by Does not apply route 4 (four) times daily. E11.65 1 kit 0  . Calcium Carb-Cholecalciferol (CALCIUM 500 + D3 PO) Take 1 tablet by mouth daily.    . Cholecalciferol (VITAMIN D3) 2000 units TABS Take 2,000 Units by mouth daily.    . citalopram (CELEXA) 40 MG tablet Take 40 mg by mouth daily.    . Continuous Blood Gluc Sensor (FREESTYLE LIBRE 14 DAY SENSOR) MISC Inject 1 each into the skin every 14 (fourteen) days. Use as directed. 2 each 2  . dexlansoprazole (DEXILANT) 60 MG capsule Take 1 capsule (60 mg total) by mouth daily. 90 capsule 3  . docusate sodium (COLACE) 100 MG capsule Take 100 mg by mouth 2 (two) times daily as needed for mild constipation.    Marland Kitchen donepezil (ARICEPT) 5 MG tablet Take 5 mg by mouth at bedtime.    . eszopiclone (LUNESTA) 2 MG TABS tablet  Take 2 mg by mouth at bedtime as needed for sleep. Take immediately before bedtime    . fenofibrate (TRICOR) 145 MG tablet TAKE 1 TABLET ONCE DAILY. 30 tablet 0  . gabapentin (NEURONTIN) 800 MG tablet Take 800 mg by mouth 4 (four) times daily.     Marland Kitchen glipiZIDE (GLUCOTROL XL) 2.5 MG 24 hr tablet Take 2 tablets (5 mg total) by mouth daily with breakfast. 30 tablet 0  . insulin detemir (LEVEMIR FLEXTOUCH) 100 UNIT/ML FlexPen Inject 60 Units into the skin 2 (two) times daily. 60 mL 2  . insulin lispro (HUMALOG) 100 UNIT/ML KwikPen Inject 0.2-0.26 mLs (20-26 Units total) into the skin 3 (three) times daily before meals. 30 mL 0  . Insulin Pen Needle (PEN NEEDLES) 31G X 6 MM MISC 1 each by Does not apply route 4 (four) times daily. 400 each 1  . lisinopril-hydrochlorothiazide (PRINZIDE,ZESTORETIC) 20-12.5 MG tablet Take 2 tablets by mouth daily.     Marland Kitchen lubiprostone (AMITIZA) 8 MCG  capsule Take 1 capsule (8 mcg total) by mouth 2 (two) times daily with a meal. 60 capsule 3  . ondansetron (ZOFRAN) 8 MG tablet Take 1 tablet (8 mg total) by mouth every 8 (eight) hours as needed for nausea or vomiting. 30 tablet 3  . ONETOUCH VERIO test strip USE 1 STRIP TO CHECK GLUCOSE 4 TIMES DAILY 150 each 5  . pravastatin (PRAVACHOL) 40 MG tablet Take 40 mg by mouth daily.    Marland Kitchen pyridOXINE (VITAMIN B-6) 100 MG tablet Take 100 mg by mouth daily.    . sucralfate (CARAFATE) 1 g tablet Take 1 tablet (1 g total) by mouth 4 (four) times daily as needed (crush and mix with water as needed for upset stomach). 60 tablet 0  . vitamin B-12 (CYANOCOBALAMIN) 500 MCG tablet Take 500 mcg by mouth daily.     No current facility-administered medications for this visit.    Allergies as of 09/30/2019 - Review Complete 09/30/2019  Allergen Reaction Noted  . Sulfa antibiotics Other (See Comments) 03/04/2016    Family History  Problem Relation Age of Onset  . Hypertension Mother   . Colon cancer Mother        diagnosed early 106s  . Cerebral aneurysm Mother   . Alcohol abuse Father   . Pancreatic cancer Sister        deceased in 72s  . Colon cancer Sister        diagnosed 38 y/o  . Stroke Brother   . Cancer Maternal Grandmother   . Cancer Maternal Grandfather   . Cancer Paternal Grandmother   . Other Paternal Grandfather   . Other Brother     Social History   Socioeconomic History  . Marital status: Single    Spouse name: Not on file  . Number of children: Not on file  . Years of education: Not on file  . Highest education level: Not on file  Occupational History  . Not on file  Tobacco Use  . Smoking status: Never Smoker  . Smokeless tobacco: Never Used  Substance and Sexual Activity  . Alcohol use: No  . Drug use: No  . Sexual activity: Never  Other Topics Concern  . Not on file  Social History Narrative  . Not on file   Social Determinants of Health   Financial Resource  Strain:   . Difficulty of Paying Living Expenses: Not on file  Food Insecurity:   . Worried About Charity fundraiser in  the Last Year: Not on file  . Ran Out of Food in the Last Year: Not on file  Transportation Needs:   . Lack of Transportation (Medical): Not on file  . Lack of Transportation (Non-Medical): Not on file  Physical Activity:   . Days of Exercise per Week: Not on file  . Minutes of Exercise per Session: Not on file  Stress:   . Feeling of Stress : Not on file  Social Connections:   . Frequency of Communication with Friends and Family: Not on file  . Frequency of Social Gatherings with Friends and Family: Not on file  . Attends Religious Services: Not on file  . Active Member of Clubs or Organizations: Not on file  . Attends Archivist Meetings: Not on file  . Marital Status: Not on file    Review of Systems: Gen: See HPI CV: See HPI Resp: See HPI GI: See HPI Heme: See HPI  Physical Exam: BP (!) 142/85   Pulse 93   Temp (!) 97.1 F (36.2 C) (Oral)   Ht 5' 2"  (1.575 m)   Wt 198 lb 12.8 oz (90.2 kg)   BMI 36.36 kg/m  General:   Alert and oriented. No distress noted. Pleasant and cooperative.  Head:  Normocephalic and atraumatic. Eyes:  Conjuctiva clear without scleral icterus. Heart:  S1, S2 present without murmurs appreciated. Lungs:  Clear to auscultation bilaterally. No wheezes, rales, or rhonchi. No distress.  Abdomen:  +BS, soft, non-tender and non-distended. No rebound or guarding. No HSM or masses noted. Msk:  Symmetrical without gross deformities. Normal posture. Extremities:  Without edema. Neurologic:  Alert and  oriented x4 Psych:  Normal mood and affect.

## 2019-09-30 ENCOUNTER — Ambulatory Visit (INDEPENDENT_AMBULATORY_CARE_PROVIDER_SITE_OTHER): Payer: Medicare Other | Admitting: Gastroenterology

## 2019-09-30 ENCOUNTER — Encounter: Payer: Self-pay | Admitting: *Deleted

## 2019-09-30 ENCOUNTER — Other Ambulatory Visit: Payer: Self-pay

## 2019-09-30 ENCOUNTER — Encounter: Payer: Self-pay | Admitting: Gastroenterology

## 2019-09-30 VITALS — BP 142/85 | HR 93 | Temp 97.1°F | Ht 62.0 in | Wt 198.8 lb

## 2019-09-30 DIAGNOSIS — IMO0002 Reserved for concepts with insufficient information to code with codable children: Secondary | ICD-10-CM

## 2019-09-30 DIAGNOSIS — K5900 Constipation, unspecified: Secondary | ICD-10-CM

## 2019-09-30 DIAGNOSIS — R1013 Epigastric pain: Secondary | ICD-10-CM | POA: Diagnosis not present

## 2019-09-30 DIAGNOSIS — R110 Nausea: Secondary | ICD-10-CM | POA: Diagnosis not present

## 2019-09-30 DIAGNOSIS — E1165 Type 2 diabetes mellitus with hyperglycemia: Secondary | ICD-10-CM

## 2019-09-30 DIAGNOSIS — F0390 Unspecified dementia without behavioral disturbance: Secondary | ICD-10-CM

## 2019-09-30 HISTORY — DX: Unspecified dementia, unspecified severity, without behavioral disturbance, psychotic disturbance, mood disturbance, and anxiety: F03.90

## 2019-09-30 MED ORDER — ONDANSETRON HCL 8 MG PO TABS: 8.0000 mg | ORAL_TABLET | Freq: Three times a day (TID) | ORAL | 3 refills | Status: DC | PRN

## 2019-09-30 NOTE — Patient Instructions (Addendum)
Please have ultrasound completed.  We will call you with results.  Continue Dexilant daily.  Be sure you are following a gastroparesis diet.  You should be following a low-fat and low fiber diet.  Avoid raw vegetables and fruits.   Be sure you are eating 4-6 small meals daily.  Do not eat within 3 hours of laying down.  Is very important that you keep your blood sugar well controlled as this is contributing to your delayed stomach emptying.  Would also recommend working on weight loss through diet and exercise with a goal of 1-2 pounds weekly.  Continue working closely with your nutritionist.  Continue Amitiza 8 mcg twice daily.  We will follow up with you as scheduled in June.  Call if you have questions or concerns prior.  Aliene Altes, PA-C [facility]

## 2019-10-01 DIAGNOSIS — E119 Type 2 diabetes mellitus without complications: Secondary | ICD-10-CM | POA: Diagnosis not present

## 2019-10-01 DIAGNOSIS — E7800 Pure hypercholesterolemia, unspecified: Secondary | ICD-10-CM | POA: Diagnosis not present

## 2019-10-01 DIAGNOSIS — I10 Essential (primary) hypertension: Secondary | ICD-10-CM | POA: Diagnosis not present

## 2019-10-01 NOTE — Assessment & Plan Note (Addendum)
Chronic intermittent nausea without vomiting continues. Suspect this is likely related to gastroparesis in the setting of uncontrolled diabetes with recent A1c 9.2 in March 2021.  GES completed 08/27/2019 with borderline delayed gastric emptying. EGD on file from 2019 with erosive reflux esophagitis, hiatal hernia, otherwise normal. She does report intermittent epigastric pain that is not always associated with nausea or necessarily associated with meals. GERD well controlled on Dexilant daily.  Sometimes abdominal pain occurs prior to BMs and improves thereafter.  No alarm symptoms.  Abdominal exam benign today.   Although I suspect chronic nausea and abdominal pain is secondary to gastroparesis, possible influence of IBS on abdominal pain, I feel it is also reasonable to evaluate for possible biliary etiology as we have not evaluated her gallbladder thus far.    Complete right upper quadrant ultrasound.  May consider HIDA scan in the near future to completely exclude gallbladder etiology. Continue Dexilant daily. Follow gastroparesis diet including low-fat and low fiber diet avoiding raw vegetables and fruits. 4-6 small meals daily.  Do not eat within 3 hours of laying down. Reinforced importance of tight glycemic control.  Recommended weight loss through diet and exercise with goal 1-2 pounds weekly.  She should continue to work closely with her nutritionist. Continue Zofran 8 mg q8 hrs prn. Refilled today.  Follow-up as scheduled in June 2021.

## 2019-10-01 NOTE — Assessment & Plan Note (Addendum)
Well-controlled on Amitiza 8 mcg twice daily.  No alarm symptoms.  Colonoscopy up-to-date in 2019 with single tubular adenoma, internal hemorrhoids, random colon biopsies negative.  Due for repeat colonoscopy in 2024.  Continue Amitiza 8 mcg daily. Follow-up as scheduled in June 2021.

## 2019-10-01 NOTE — Assessment & Plan Note (Addendum)
Chronic intermittent epigastric pain and daily nausea without vomiting.  Borderline delayed gastric emptying on GES completed 08/27/2019.  EGD in 2019 with erosive reflux esophagitis, hiatal hernia, otherwise normal. She reports recent 2 week episode of constant epigastric pain with one episode of emesis. Epigastric pain resolved for about 2 weeks until waking this morning with pain but this improved after have a couple of BMs. Intermittent nausea continues. Denies worsening of epigastric with meals. Nausea not necessarily associated with epigastric pain.  GERD well controlled on Dexilant daily. Also notes intermittent epigastric pain prior to BMs that improves thereafter. Constipation is well controlled on Amitiza 8 mg BID.  No alarm symptoms. Denies NSAID use other than baby aspirin.  Most recent A1C 9.2 on 09/26/19. Abdominal exam benign today.   Suspect upper abdominal pain and nausea are likely related to gastroparesis in the setting of uncontrolled diabetes. May also have a component of IBS as she notes pain prior to BMs that improve thereafter. With chronic nausea, I do feel it is appropriate to evaluate for potential influence of biliary etiology as we have not evaluated this thus far.   RUQ [place]. May consider HIDA in the future to completely exclude gallbladder etiology.  Continue Dexilant daily. Follow gastroparesis diet including low-fat and low fiber diet avoiding raw vegetables and fresh fruits. Eat 4-6 small meals daily.  Do not eat within 3 hours of laying down. Reinforce importance of tight glycemic control. Recommend working on weight loss through diet and exercise with goal of 1-2 times weekly.  Continue working closely with nutritionist. Continue Amitiza 8 mcg twice daily. Continue Zofran 8 mg q8 hrs prn. Refilled today.  Follow-up as scheduled in June 2021.

## 2019-10-03 ENCOUNTER — Ambulatory Visit (HOSPITAL_COMMUNITY)
Admission: RE | Admit: 2019-10-03 | Discharge: 2019-10-03 | Disposition: A | Discharge status: Home / Self Care | Payer: Medicare Other | Source: Ambulatory Visit | Attending: Gastroenterology | Admitting: Gastroenterology

## 2019-10-03 ENCOUNTER — Other Ambulatory Visit: Payer: Self-pay

## 2019-10-03 DIAGNOSIS — R1013 Epigastric pain: Secondary | ICD-10-CM | POA: Insufficient documentation

## 2019-10-03 DIAGNOSIS — R110 Nausea: Secondary | ICD-10-CM | POA: Diagnosis not present

## 2019-10-03 DIAGNOSIS — K760 Fatty (change of) liver, not elsewhere classified: Secondary | ICD-10-CM | POA: Diagnosis not present

## 2019-10-04 ENCOUNTER — Other Ambulatory Visit: Payer: Self-pay

## 2019-10-04 DIAGNOSIS — K760 Fatty (change of) liver, not elsewhere classified: Secondary | ICD-10-CM

## 2019-10-04 DIAGNOSIS — Z79899 Other long term (current) drug therapy: Secondary | ICD-10-CM

## 2019-10-04 NOTE — Progress Notes (Signed)
Liver consistent with fatty liver. Gallbladder within normal limits. CBD is slightly prominent at 49mm. We need to update her LFTs to help determine if further imaging is needed.   Elmo Putt, please arrange HFP.   Instructions for fatty liver: Recommend 1-2# weight loss per week until ideal body weight through exercise & diet. Low fat/cholesterol diet.  Avoid sweets, sodas, fruit juices, sweetened beverages like tea, etc. Gradually increase exercise from 15 min daily up to 1 hr per day 5 days/week. Limit alcohol use.

## 2019-10-08 ENCOUNTER — Other Ambulatory Visit: Payer: Self-pay | Admitting: "Endocrinology

## 2019-10-10 ENCOUNTER — Other Ambulatory Visit: Payer: Self-pay

## 2019-10-10 ENCOUNTER — Telehealth: Payer: Self-pay | Admitting: Internal Medicine

## 2019-10-10 MED ORDER — GLIPIZIDE ER 2.5 MG PO TB24: 5.0000 mg | ORAL_TABLET | Freq: Every day | ORAL | 0 refills | Status: DC

## 2019-10-10 NOTE — Telephone Encounter (Signed)
PATIENT WOULD LIKE HER LAB ORDERS MAILED TO HER AGAIN, DID NOT RECEIVE THEM FROM LAST WEEK

## 2019-10-10 NOTE — Telephone Encounter (Signed)
Noted. Lab orders mailed per pts request.

## 2019-10-11 ENCOUNTER — Other Ambulatory Visit: Payer: Self-pay | Admitting: "Endocrinology

## 2019-10-17 ENCOUNTER — Other Ambulatory Visit: Payer: Self-pay | Admitting: "Endocrinology

## 2019-11-05 ENCOUNTER — Telehealth: Payer: Self-pay | Admitting: Internal Medicine

## 2019-11-05 NOTE — Telephone Encounter (Signed)
error 

## 2019-11-06 ENCOUNTER — Encounter: Payer: Self-pay | Admitting: Nurse Practitioner

## 2019-11-06 ENCOUNTER — Ambulatory Visit (INDEPENDENT_AMBULATORY_CARE_PROVIDER_SITE_OTHER): Payer: Medicare HMO | Admitting: Nurse Practitioner

## 2019-11-06 ENCOUNTER — Other Ambulatory Visit: Payer: Self-pay

## 2019-11-06 VITALS — BP 150/78 | HR 94 | Temp 97.1°F | Ht 62.0 in | Wt 200.0 lb

## 2019-11-06 DIAGNOSIS — K5900 Constipation, unspecified: Secondary | ICD-10-CM

## 2019-11-06 DIAGNOSIS — R1011 Right upper quadrant pain: Secondary | ICD-10-CM

## 2019-11-06 DIAGNOSIS — R112 Nausea with vomiting, unspecified: Secondary | ICD-10-CM

## 2019-11-06 DIAGNOSIS — R1013 Epigastric pain: Secondary | ICD-10-CM | POA: Diagnosis not present

## 2019-11-06 DIAGNOSIS — R140 Abdominal distension (gaseous): Secondary | ICD-10-CM

## 2019-11-06 DIAGNOSIS — R110 Nausea: Secondary | ICD-10-CM

## 2019-11-06 MED ORDER — MOTEGRITY 2 MG PO TABS: 2.0000 mg | ORAL_TABLET | Freq: Every day | ORAL | 3 refills | Status: DC

## 2019-11-06 NOTE — Patient Instructions (Addendum)
Your health issues we discussed today were:   Abdominal pain with nausea: 1. Continue using your nausea medicine (Zofran) 2. I have put in an order for a HIDA scan to evaluate your gallbladder function 3. We can refer you to the surgeon to evaluate if taking out your gallbladder will help 4. Call us if you have any worsening or severe symptoms  Constipation and bloating: 1. You can try over the counter simethicone (Gas-X) or Beano to see if it helps with your gas and bloating 2. I have sent a prescription for Motegrity 2 mg to your pharmacy.  Take this once a day 3. Call us in 1 to 2 weeks and let us know if it is helping your constipation 4. Call us if you have any problems at the pharmacy getting your medication 5. Call us if you have any worsening or severe symptoms  Overall I recommend:  1. Continue your other current medications 2. Return for follow-up in 6 weeks 3. Call us if you have any questions or concerns   ---------------------------------------------------------------  I am glad you got your COVID-19 vaccination!  Even though you are vaccinated you should continue to wear a mask, socially distance from people you do not live with her closely associate with, and wash your hands frequently.  ---------------------------------------------------------------   At [facility] we value your feedback. You may receive a survey about your visit today. Please share your experience as we strive to create trusting relationships with our patients to provide genuine, compassionate, quality care.  We appreciate your understanding and patience as we review any laboratory studies, imaging, and other diagnostic tests that are ordered as we care for you. Our office policy is 5 business days for review of these results, and any emergent or urgent results are addressed in a timely manner for your best interest. If you do not hear from our office in 1 week, please contact us.    We also encourage the use of MyChart, which contains your medical information for your review as well. If you are not enrolled in this feature, an access code is on this after visit summary for your convenience. Thank you for allowing [place] to be involved in your care.  It was great to see you today!  I hope you have a great day!!

## 2019-11-06 NOTE — Progress Notes (Signed)
Referring Provider: Monico Blitz, MD Primary Care Physician:  Monico Blitz, MD Primary GI:  Dr. Gala Romney  Chief Complaint  Patient presents with  . Abdominal Pain    upper abd still ongoing, reports had lab work ordered by [employer] done at [facility]  . Nausea    no vomiting. Zofran helps at times  . diarrhea/constipation    more constipation  . Gas    w/ bloating    HPI:   Carrie Turner is a 69 y.o. female who presents for follow-up on abdominal pain, nausea, diarrhea/constipation.  The patient was last seen in our office 09/30/2019 for the same.  Colonoscopy at day 2019 with tubular adenoma and internal hemorrhoids and random colon biopsies negative recommended 5-year surveillance (2024).  EGD at that time with reflux esophagitis status post dilation, hiatal hernia, small bowel biopsies negative for celiac.  History of poorly controlled diabetes felt likely contributing to nausea.  Linzess caused loose stools historically, switch to Amitiza 8 mcg twice daily.  GES 2-20 21 with borderline delayed gastric emptying (87% emptying at 4 hours with normal greater than 90%).  Recommended gastroparesis diet and information was given to the patient.  At her last visit she noted she was eating 5 small meals and thinks abdominal pain is related to her gallbladder.  She saw Dr. Arnoldo Morale and had her gallbladder removed and feels much better.  About 1 month ago she had 2 weeks of constant upper abdominal pain and increased gas which resolved for about 2 weeks.  This morning she awoke in with abdominal pain.  Had 2 BMs with helped ease the pain.  No typical worsening with eating.  Noted intermittent nausea but no vomiting GERD symptoms well controlled without dysphagia.  Daily bowel movements, sometimes 2 with complete stools and no straining or hard stools.  Upper abdominal pain improves after bowel movement.  Recent A1c 9.2 on 09/26/2019 and follows with endocrinology.  No other overt GI complaints.  Recommended  abdominal ultrasound, continue Dexilant, ensure gastroparesis diet, better blood sugar control, weight loss through diet and exercise, continue Amitiza 8 mcg twice daily, follow-up in June.  Right upper quadrant ultrasound completed 10/03/2019 which found CBD minimally dilated at 6 mm and recommended correlation with LFTs and if abnormal consider MRCP.  Noted hepatic steatosis.  HFP was ordered and per the patient completed at [facility]. We attempted to get lab results but [facility] doesn't have a record of LFTs at their facility. Today she states she's still having mild RUQ/epigastric abdominal discomfort and bloating. It is intermittent, but present 90% of the time. Also with some constipation and wanting additional treatment options. Family history of gallbladder disease and daughter wanting evaluation for this. Persistent nausea but denies vomiting, helped with Zofran and Ginger Chews. Denies hematochezia, melena, fever, chills, unintentional weight loss. Denies URI or flu-like symptoms. Denies loss of sense of taste or smell. Denies chest pain, dyspnea, dizziness, lightheadedness, syncope, near syncope. Denies any other upper or lower GI symptoms.  Past Medical History:  Diagnosis Date  . Anxiety   . COPD (chronic obstructive pulmonary disease) ([place])   . Dementia ([place]) 09/30/2019   per patient, early dementia diagnosed after seeing PCP recently  . Depression   . Diabetes mellitus, type II ([place])   . GERD (gastroesophageal reflux disease)   . Glaucoma   . Hyperlipidemia   . Hypertension   . Migraines   . Neuropathy   . Seizures ([place])    seizure was  from ETOH, "a long time ago", no med and no recurrance  . Vitamin D deficiency     Past Surgical History:  Procedure Laterality Date  . ABDOMINAL HYSTERECTOMY    . CATARACT EXTRACTION W/PHACO Left 07/24/2019   Procedure: CATARACT EXTRACTION PHACO AND INTRAOCULAR LENS PLACEMENT LEFT EYE  (CDE: 5.60);  Surgeon: Baruch Goldmann, MD;  Location: [facility]  ORS;  Service: Ophthalmology;  Laterality: Left;  . CATARACT EXTRACTION W/PHACO Right 08/19/2019   Procedure: CATARACT EXTRACTION PHACO AND INTRAOCULAR LENS PLACEMENT RIGHT EYE;  Surgeon: Baruch Goldmann, MD;  Location: [facility] ORS;  Service: Ophthalmology;  Laterality: Right;  CDE: 8.83  . COLONOSCOPY  11/2015   Dr. Britta Mccreedy: sessile polyp removed (benign). advised repeat colonoscopy in 5 years.   . COLONOSCOPY WITH PROPOFOL N/A 10/26/2017   RMR: Tubular adenoma removed.  Random colon biopsies negative.  Nonbleeding internal hemorrhoids.  Next colonoscopy in 5 years.  . ESOPHAGEAL DILATION  10/26/2017   Procedure: ESOPHAGEAL DILATION;  Surgeon: Daneil Dolin, MD;  Location: [facility] ENDO SUITE;  Service: Endoscopy;;  . ESOPHAGOGASTRODUODENOSCOPY  11/2015   Dr. Britta Mccreedy: hiatal hernia  . ESOPHAGOGASTRODUODENOSCOPY (EGD) WITH PROPOFOL N/A 10/26/2017   RMR: Erosive reflux esophagitis, hiatal hernia.  Small bowel biopsies negative.  . INSERTION OF ANTERIOR SEGMENT AQUEOUS DRAINAGE DEVICE (ISTENT) Left 07/24/2019   Procedure: INSERTION OF ANTERIOR SEGMENT AQUEOUS DRAINAGE DEVICE (ISTENT) LEFT EYE;  Surgeon: Baruch Goldmann, MD;  Location: [facility] ORS;  Service: Ophthalmology;  Laterality: Left;  . INSERTION OF ANTERIOR SEGMENT AQUEOUS DRAINAGE DEVICE (ISTENT) Right 08/19/2019   Procedure: INSERTION OF ANTERIOR SEGMENT AQUEOUS DRAINAGE DEVICE (ISTENT) RIGHT EYE;  Surgeon: Baruch Goldmann, MD;  Location: [facility] ORS;  Service: Ophthalmology;  Laterality: Right;  . KNEE ARTHROSCOPY Right   . ORIF ANKLE FRACTURE Right     Current Outpatient Medications  Medication Sig Dispense Refill  . acetaminophen (TYLENOL) 500 MG tablet Take 1,000 mg by mouth every 6 (six) hours as needed (for pain/headaches.).    Marland Kitchen albuterol (VENTOLIN HFA) 108 (90 Base) MCG/ACT inhaler Inhale 1-2 puffs into the lungs every 6 (six) hours as needed for wheezing or shortness of breath.    Marland Kitchen amLODipine (NORVASC) 2.5 MG tablet Take 2.5 mg by mouth daily.    Marland Kitchen  aspirin EC 81 MG tablet Take 81 mg by mouth daily.    . Blood Glucose Monitoring Suppl (ONETOUCH VERIO) w/Device KIT 1 each by Does not apply route 4 (four) times daily. E11.65 1 kit 0  . Calcium Carb-Cholecalciferol (CALCIUM 500 + D3 PO) Take 1 tablet by mouth daily.    . Cholecalciferol (VITAMIN D3) 2000 units TABS Take 2,000 Units by mouth daily.    . citalopram (CELEXA) 40 MG tablet Take 40 mg by mouth daily.    . Continuous Blood Gluc Sensor (FREESTYLE LIBRE 14 DAY SENSOR) MISC Inject 1 each into the skin every 14 (fourteen) days. Use as directed. 2 each 2  . dexlansoprazole (DEXILANT) 60 MG capsule Take 1 capsule (60 mg total) by mouth daily. 90 capsule 3  . docusate sodium (COLACE) 100 MG capsule Take 100 mg by mouth 2 (two) times daily as needed for mild constipation.    Marland Kitchen donepezil (ARICEPT) 5 MG tablet Take 5 mg by mouth at bedtime.    . eszopiclone (LUNESTA) 2 MG TABS tablet Take 2 mg by mouth at bedtime as needed for sleep. Take immediately before bedtime    . fenofibrate (TRICOR) 145 MG tablet TAKE 1 TABLET ONCE DAILY. 90 tablet 0  .  gabapentin (NEURONTIN) 800 MG tablet Take 800 mg by mouth 4 (four) times daily.     Marland Kitchen glipiZIDE (GLUCOTROL XL) 5 MG 24 hr tablet Take 1 tablet (5 mg total) by mouth daily with breakfast. 30 tablet 3  . insulin detemir (LEVEMIR FLEXTOUCH) 100 UNIT/ML FlexPen Inject 60 Units into the skin 2 (two) times daily. 60 mL 2  . insulin lispro (HUMALOG) 100 UNIT/ML KwikPen Inject 0.2-0.26 mLs (20-26 Units total) into the skin 3 (three) times daily before meals. 30 mL 0  . Insulin Pen Needle (PEN NEEDLES) 31G X 6 MM MISC 1 each by Does not apply route 4 (four) times daily. 400 each 1  . lisinopril-hydrochlorothiazide (PRINZIDE,ZESTORETIC) 20-12.5 MG tablet Take 2 tablets by mouth daily.     Marland Kitchen lubiprostone (AMITIZA) 8 MCG capsule Take 1 capsule (8 mcg total) by mouth 2 (two) times daily with a meal. 60 capsule 3  . ondansetron (ZOFRAN) 8 MG tablet Take 1 tablet (8 mg  total) by mouth every 8 (eight) hours as needed for nausea or vomiting. 30 tablet 3  . ONETOUCH VERIO test strip USE 1 STRIP TO CHECK GLUCOSE 4 TIMES DAILY 150 each 5  . pravastatin (PRAVACHOL) 40 MG tablet Take 40 mg by mouth daily.    Marland Kitchen pyridOXINE (VITAMIN B-6) 100 MG tablet Take 100 mg by mouth daily.    . Sennosides (EX-LAX PO) Take by mouth as needed.    . sucralfate (CARAFATE) 1 g tablet Take 1 tablet (1 g total) by mouth 4 (four) times daily as needed (crush and mix with water as needed for upset stomach). 60 tablet 0  . vitamin B-12 (CYANOCOBALAMIN) 500 MCG tablet Take 500 mcg by mouth daily.     No current facility-administered medications for this visit.    Allergies as of 11/06/2019 - Review Complete 11/06/2019  Allergen Reaction Noted  . Sulfa antibiotics Other (See Comments) 03/04/2016    Family History  Problem Relation Age of Onset  . Hypertension Mother   . Colon cancer Mother        diagnosed early 18s  . Cerebral aneurysm Mother   . Alcohol abuse Father   . Pancreatic cancer Sister        deceased in 82s  . Colon cancer Sister        diagnosed 39 y/o  . Stroke Brother   . Cancer Maternal Grandmother   . Cancer Maternal Grandfather   . Cancer Paternal Grandmother   . Other Paternal Grandfather   . Other Brother     Social History   Socioeconomic History  . Marital status: Single    Spouse name: Not on file  . Number of children: Not on file  . Years of education: Not on file  . Highest education level: Not on file  Occupational History  . Not on file  Tobacco Use  . Smoking status: Never Smoker  . Smokeless tobacco: Never Used  Substance and Sexual Activity  . Alcohol use: No  . Drug use: No  . Sexual activity: Never  Other Topics Concern  . Not on file  Social History Narrative  . Not on file   Social Determinants of Health   Financial Resource Strain:   . Difficulty of Paying Living Expenses:   Food Insecurity:   . Worried About Paediatric nurse in the Last Year:   . Arboriculturist in the Last Year:   Transportation Needs:   . Lack of Transportation (  Medical):   Marland Kitchen Lack of Transportation (Non-Medical):   Physical Activity:   . Days of Exercise per Week:   . Minutes of Exercise per Session:   Stress:   . Feeling of Stress :   Social Connections:   . Frequency of Communication with Friends and Family:   . Frequency of Social Gatherings with Friends and Family:   . Attends Religious Services:   . Active Member of Clubs or Organizations:   . Attends Archivist Meetings:   Marland Kitchen Marital Status:     Subjective: Review of Systems  Constitutional: Negative for chills, fever, malaise/fatigue and weight loss.  HENT: Negative for congestion and sore throat.   Respiratory: Negative for cough and shortness of breath.   Cardiovascular: Negative for chest pain and palpitations.  Gastrointestinal: Positive for abdominal pain, constipation, nausea and vomiting. Negative for blood in stool, diarrhea and melena.       Bloating  Musculoskeletal: Negative for joint pain and myalgias.  Skin: Negative for rash.  Neurological: Negative for dizziness and weakness.  Endo/Heme/Allergies: Does not bruise/bleed easily.  Psychiatric/Behavioral: Negative for depression. The patient is not nervous/anxious.   All other systems reviewed and are negative.    Objective: BP (!) 150/78   Pulse 94   Temp (!) 97.1 F (36.2 C) (Oral)   Ht _0  (1.575 m)   Wt 200 lb (90.7 kg)   BMI 36.58 kg/m  Physical Exam Vitals and nursing note reviewed.  Constitutional:      General: She is not in acute distress.    Appearance: Normal appearance. She is well-developed. She is not ill-appearing, toxic-appearing or diaphoretic.  HENT:     Head: Normocephalic and atraumatic.     Nose: No congestion or rhinorrhea.  Eyes:     General: No scleral icterus. Cardiovascular:     Rate and Rhythm: Normal rate and regular rhythm.     Heart sounds:  Normal heart sounds.  Pulmonary:     Effort: Pulmonary effort is normal. No respiratory distress.     Breath sounds: Normal breath sounds.  Abdominal:     General: Bowel sounds are normal.     Palpations: Abdomen is soft. There is no hepatomegaly, splenomegaly or mass.     Tenderness: There is no abdominal tenderness. There is no guarding or rebound.     Hernia: No hernia is present.  Skin:    General: Skin is warm and dry.     Coloration: Skin is not jaundiced.     Findings: No rash.  Neurological:     General: No focal deficit present.     Mental Status: She is alert and oriented to person, place, and time.  Psychiatric:        Attention and Perception: Attention normal.        Mood and Affect: Mood normal.        Speech: Speech normal.        Behavior: Behavior normal.        Thought Content: Thought content normal.        Cognition and Memory: Cognition and memory normal.       11/06/2019 3:45 PM   Disclaimer: This note was dictated with voice recognition software. Similar sounding words can inadvertently be transcribed and may not be corrected upon review.

## 2019-11-07 ENCOUNTER — Telehealth: Payer: Self-pay

## 2019-11-07 ENCOUNTER — Encounter: Payer: Self-pay | Admitting: Internal Medicine

## 2019-11-07 NOTE — Telephone Encounter (Signed)
Called pt's daughter Helene Kelp, listed on DPR), gave her HIDA appt. Letter mailed to pt.

## 2019-11-07 NOTE — Telephone Encounter (Signed)
HIDA scheduled for 11/14/19 at 10:00am, arrive at 9:45am. NPO after midnight and no pain medication.  Tried to call pt to inform of appt, no answer, LMOAM for return call.

## 2019-11-07 NOTE — Telephone Encounter (Signed)
Tried to call pt, no answer, LMOAM for return call. 

## 2019-11-08 ENCOUNTER — Encounter: Payer: Self-pay | Admitting: Nurse Practitioner

## 2019-11-08 ENCOUNTER — Telehealth: Payer: Self-pay | Admitting: Nurse Practitioner

## 2019-11-08 DIAGNOSIS — R7401 Elevation of levels of liver transaminase levels: Secondary | ICD-10-CM

## 2019-11-08 NOTE — Assessment & Plan Note (Signed)
Patient describes some bloating and gas-like symptoms.  This could be related to biliary etiology if this indeed is responsible for some of her symptoms.  In the meantime we will have her take simethicone over-the-counter to see if this helps.  Recommend she call us with a progress report or if no improvement.  Follow-up in 6 weeks.

## 2019-11-08 NOTE — Assessment & Plan Note (Signed)
Nausea likely related to her epigastric and right upper quadrant pain.  Biliary etiology is in the differentials however cannot exclude silent GERD, constipation, esophagitis, gastritis, duodenitis, erosions.  Further recommendations will follow the results of her HIDA scan and trial of Motegrity and simethicone.  Follow-up in 6 weeks.  Call for any worsening or severe symptoms in the interim.

## 2019-11-08 NOTE — Assessment & Plan Note (Signed)
The patient is describing some constipation and straining.  Some hard stools.  She has a history of borderline gastroparesis.  She is following gastroparesis diet.  Due to anecdotal evidence of possible increased motility with Motegrity I will trial her on this for her constipation to see if we can get some global improvement in some of her other symptoms as well.  Call with progress report of any problems getting medication filled.  Follow-up in 6 months.

## 2019-11-08 NOTE — Telephone Encounter (Signed)
Please tell the patient we finally received her labs from [facility]. Her alk phos (a liver enzyme) is mildly elevated. However, this can come from many sources (ie- bone). I'd like to do a test to see if the Alk Phos elevation is more liver/biliary. If so, given her borderline bile duct dilation we may recommend an MRCP to further evaluate her CBD (drainage duct from the gallbladder/liver).  GGT order entered. Will also check CMP again

## 2019-11-08 NOTE — Progress Notes (Signed)
Labs received. LFTs essentially normal except Alk Phos mildly elevated at 128 (ULN 92). I will have staff reach out to the patient to recommend GGT to differentiate Alk phos from liver source vs. Other source. If Liver, can consider MRCP.

## 2019-11-08 NOTE — Assessment & Plan Note (Signed)
Epigastric/right upper quadrant abdominal pain.  Previous ultrasound showed borderline dilated CBD at 6 mm and recommended MRCP after correlation with liver labs.  We requested her liver labs and did not receive them.  They stated they were unable to find them.  We will continue to request.  Given her constellation of symptoms including abdominal pain, nausea, postprandial symptoms and history of cholecystitis as well as patient and family request I will schedule her for a HIDA scan to evaluate biliary function.  I will also refer her to a surgeon to evaluate possible need for cholecystectomy.  Follow-up in our office in 6 weeks.

## 2019-11-11 NOTE — Telephone Encounter (Signed)
Tried calling pt. Number is currently busy. Will call pt back.

## 2019-11-12 NOTE — Telephone Encounter (Signed)
Tried calling pt. No VM.

## 2019-11-14 ENCOUNTER — Ambulatory Visit (HOSPITAL_COMMUNITY): Payer: Medicare HMO

## 2019-11-14 ENCOUNTER — Telehealth: Payer: Self-pay

## 2019-11-14 NOTE — Telephone Encounter (Signed)
EG I have labs orders that were printed last week for pt. Disregard lab orders needed. CMP was ordered.

## 2019-11-14 NOTE — Telephone Encounter (Signed)
Pt called and was suppose to have NM HEPATO W/ EF at [facility] this morning 11/14/19 @ 10:AM and wants to r/s. Pt had her second covid vaccine and isn't feeling well.

## 2019-11-14 NOTE — Telephone Encounter (Signed)
Called patient - lmovm to call nuc med at [phone] to r/s.

## 2019-11-14 NOTE — Telephone Encounter (Signed)
EG, pt called this morning to cancel NM HEPATO W/ EF this morning at [facility]. Pt took her second covid vaccine and isn't feeling well. [facility] have been notified. I can mail lab orders for pt. Does pt just need a CMP completed?

## 2019-11-15 NOTE — Telephone Encounter (Signed)
Noted, no further recommendations. Will follow for results.

## 2019-11-20 ENCOUNTER — Telehealth: Payer: Self-pay | Admitting: Internal Medicine

## 2019-11-20 NOTE — Telephone Encounter (Signed)
Spoke with pt. Lab orders were faxed to [facility] per pts request. Number was also given for pt to r/s her apt. MB previously left a message for pt and that call was addressed by me.

## 2019-11-20 NOTE — Telephone Encounter (Signed)
Pt said the hospital told her that she needed to call us to reschedule her HIda Scan. She was scheduled for 4/22, but cancelled because she was sick. Please advise. [phone]

## 2019-11-20 NOTE — Telephone Encounter (Signed)
Pt said she doesn't have the lab orders and not sure where to go to have them done.  Please call her at [phone]

## 2019-11-20 NOTE — Telephone Encounter (Signed)
Tried to call pt, no answer, LMOAM and gave her phone number to nuc med dept to reschedule test.

## 2019-11-21 ENCOUNTER — Other Ambulatory Visit (HOSPITAL_COMMUNITY)
Admission: RE | Admit: 2019-11-21 | Discharge: 2019-11-21 | Disposition: A | Discharge status: Home / Self Care | Payer: Medicare HMO | Source: Ambulatory Visit | Attending: Nurse Practitioner | Admitting: Nurse Practitioner

## 2019-11-21 DIAGNOSIS — R7401 Elevation of levels of liver transaminase levels: Secondary | ICD-10-CM | POA: Diagnosis present

## 2019-11-21 LAB — COMPREHENSIVE METABOLIC PANEL
ALT: 29 U/L (ref 0–44)
AST: 22 U/L (ref 15–41)
Albumin: 4 g/dL (ref 3.5–5.0)
Alkaline Phosphatase: 97 U/L (ref 38–126)
Anion gap: 15 (ref 5–15)
BUN: 16 mg/dL (ref 8–23)
CO2: 24 mmol/L (ref 22–32)
Calcium: 9.1 mg/dL (ref 8.9–10.3)
Chloride: 97 mmol/L — ABNORMAL LOW (ref 98–111)
Creatinine, Ser: 0.58 mg/dL (ref 0.44–1.00)
GFR calc Af Amer: >60 mL/min (ref >60)
GFR calc non Af Amer: >60 mL/min (ref >60)
Glucose, Bld: 183 mg/dL — ABNORMAL HIGH (ref 70–99)
Potassium: 2.9 mmol/L — ABNORMAL LOW (ref 3.5–5.1)
Sodium: 136 mmol/L (ref 135–145)
Total Bilirubin: 0.4 mg/dL (ref 0.3–1.2)
Total Protein: 7.6 g/dL (ref 6.5–8.1)

## 2019-11-21 LAB — GAMMA GT: GGT: 27 U/L (ref 7–50)

## 2019-11-26 ENCOUNTER — Ambulatory Visit (HOSPITAL_COMMUNITY)
Admission: RE | Admit: 2019-11-26 | Discharge: 2019-11-26 | Disposition: A | Discharge status: Home / Self Care | Payer: Medicare HMO | Source: Ambulatory Visit | Attending: Nurse Practitioner | Admitting: Nurse Practitioner

## 2019-11-26 ENCOUNTER — Other Ambulatory Visit: Payer: Self-pay

## 2019-11-26 DIAGNOSIS — R140 Abdominal distension (gaseous): Secondary | ICD-10-CM | POA: Diagnosis present

## 2019-11-26 DIAGNOSIS — K5900 Constipation, unspecified: Secondary | ICD-10-CM | POA: Diagnosis present

## 2019-11-26 DIAGNOSIS — R1013 Epigastric pain: Secondary | ICD-10-CM | POA: Insufficient documentation

## 2019-11-26 DIAGNOSIS — R110 Nausea: Secondary | ICD-10-CM | POA: Insufficient documentation

## 2019-11-26 DIAGNOSIS — R112 Nausea with vomiting, unspecified: Secondary | ICD-10-CM | POA: Insufficient documentation

## 2019-11-26 DIAGNOSIS — R1011 Right upper quadrant pain: Secondary | ICD-10-CM | POA: Diagnosis present

## 2019-11-26 MED ORDER — SINCALIDE 5 MCG IJ SOLR
INTRAMUSCULAR | Status: AC
  Administered 2019-11-26: 1.8 ug
  Filled 2019-11-26: qty 5

## 2019-11-26 MED ORDER — STERILE WATER FOR INJECTION IJ SOLN
INTRAMUSCULAR | Status: AC
  Administered 2019-11-26: 1.8 mL
  Filled 2019-11-26: qty 10

## 2019-11-26 MED ORDER — TECHNETIUM TC 99M MEBROFENIN IV KIT
5.0000 | PACK | Freq: Once | INTRAVENOUS | Status: AC | PRN
  Administered 2019-11-26: 5.3 via INTRAVENOUS

## 2019-11-27 ENCOUNTER — Telehealth: Payer: Self-pay | Admitting: *Deleted

## 2019-11-27 NOTE — Telephone Encounter (Signed)
Spoke with pt. Pt states that she had the HIDDA scan yesterday and has a f/u with our office at the end of May. C/o severe mid abdominal pain that comes and goes, nausea and vomiting at times. Pt take Zofran and it helps sometimes with the nausea. When nausea or vomiting are bad, pt takes Zofran q 6 hours. Pt says she doesn't feel she can wait until the end of the Month to be seen.

## 2019-11-27 NOTE — Telephone Encounter (Signed)
Patient called. She had HIDA done yesterday. Reports she is getting more sick. C/o abd pain constant.

## 2019-12-02 ENCOUNTER — Telehealth: Payer: Self-pay | Admitting: Internal Medicine

## 2019-12-02 NOTE — Telephone Encounter (Signed)
Lmom, waiting on a return call.  

## 2019-12-02 NOTE — Telephone Encounter (Signed)
PATIENT CALLED STATING SOMEONE FROM HERE CALLED HER     PLEASE CALL HER BACK

## 2019-12-03 ENCOUNTER — Other Ambulatory Visit: Payer: Self-pay | Admitting: "Endocrinology

## 2019-12-04 NOTE — Telephone Encounter (Signed)
Pts apt is 12/19/19, pt would possible like to move her appointment up. Due to her current symptoms abdominal pain, n/v. Please advise if pt should come in sooner.

## 2019-12-05 ENCOUNTER — Telehealth: Payer: Self-pay | Admitting: *Deleted

## 2019-12-05 NOTE — Telephone Encounter (Signed)
Appointment was moved up ok per EG. Pt is aware.

## 2019-12-05 NOTE — Telephone Encounter (Signed)
Spoke with pt. Pt is taking Motegrity bid as directed and it is helping her have a BM daily. I asked pt if she was taking Simethicone, pt has ordered it through the [place] and is going to start taking it today. Medication arrived this week.

## 2019-12-05 NOTE — Telephone Encounter (Signed)
Ok to move up sooner. The soonest available appointment is 5/24 with [facility].

## 2019-12-05 NOTE — Telephone Encounter (Signed)
Noted  

## 2019-12-05 NOTE — Telephone Encounter (Signed)
Patient called in. She states she is still having abd pain, nausea, vomiting. She said she was told her bile duct was leaking or something by our office and she wants to know what she needs to do. I advised her a referral was already sent to Dr. Arnoldo Morale that they were awaiting hida to be done. I provided Dr. Adline Mango # to her to call for appt. She wants a call back today about she needs to do.

## 2019-12-05 NOTE — Telephone Encounter (Signed)
First question: Has she taken the [place] we recommended at her last OV? If so, has it helped her constipation?  Has she tried the simethicone for gas we discussed? If so, has it helped her gas issues.  Her HIDA scan was normal, I am not aware of any "bile duct leak" or anything of that nature. Her common bile duct is a slight bit enlarged (6 mm) but LFTs and GGT are normal.  She can still see the surgeon to discuss if they want to remove her gallbladder.  Let me know her answers to the above questions.

## 2019-12-05 NOTE — Telephone Encounter (Signed)
Noted. See other phone note about next available appointment as well as further instructions below (previous note)

## 2019-12-12 ENCOUNTER — Telehealth: Payer: Self-pay

## 2019-12-12 NOTE — Telephone Encounter (Signed)
FYI Received a letter from [employer]. Pt was given a temporary supply of Motegrity. The medication isn't covered under pts plan. Linzess and lactulose soln are covered under pts insurance. Pt has a f/u on 12/19/2019. We will see how pt is doing on the [place] and see if pt should continue on this medication.

## 2019-12-13 NOTE — Telephone Encounter (Signed)
Noted, will evaluate at that time.

## 2019-12-13 NOTE — Telephone Encounter (Signed)
Noted  

## 2019-12-14 NOTE — Progress Notes (Signed)
Referring Provider: Monico Blitz, MD Primary Care Physician:  Monico Blitz, MD Primary GI Physician: Dr. Gala Romney  Chief Complaint  Patient presents with  . Nausea    w/ vomiting  . Abdominal Pain    seeing Dr. Arnoldo Morale tomorrow    HPI:   Carrie Turner is a 69 y.o. female with a history of GERD, constipation, nausea, epigastric pain.  Colonoscopy up-to-date in 2019 with single tubular adenoma, internal hemorrhoids, random colon biopsies negative.  Recommended 5-year surveillance.  EGD at that time with erosive reflux esophagitis, empiric dilation of esophagus with dysphagia, hiatal hernia, small bowel biopsies negative for celiac.  History of poorly controlled diabetes which has been felt to be contributing to her nausea.  GES completed on 08/27/2019 revealed borderline delayed gastric emptying.  87% emptying at 4 hours (normal greater than 90%). RUQ [place] 10/03/19 without gallstones or wall thickening, CBD slightly prominent at 52m. Historically Linzess caused loose stools and incontinence. Has also been on Amitiza 859m BID in the past. Currently on a trial of Motegrity for constipation. Zofran as needed for nausea.  Last seen on 11/06/19. Patient was still having mild RUQ/epigastric abdominal discomfort and bloating. It is intermittent, but present 90% of the time. Persistent nausea without vomiting. Also with some constipation and wanting additional treatment options. Daughter requesting gallbladder evaluation due to family history of gallbladder disease. [facility] mildly elevated alk phos at 128. Plan included HIDA scan, trial of Motegrity, simethicone, repeat LFTs and obtain GGT, place referral to surgeon to evaluate for possible need for cholecystectomy, and follow-up in 6 weeks.   Labs 11/21/2019 with LFTs within normal limits.  GGT within normal limits.  HIDA 11/26/2019 within normal limits.  Telephone call 11/27/19  with patient stating she was worsening with constant abdominal pain. She was moved up to next available appointment.  Note on 5/20 stating patients insurance doesn't cover [place]  Today:  Epigastric pain. Comes and goes. Present about 3 days a week. Feels like a "stomach ache." Abdominal pain can stick with her for days. No identified food triggers. Fried/fatty meals twice a week. Pain does worsen with fatty meals. Soup and yogurt do well. Does better with soft foods. No GERD symptoms on Dexilant daily. Nausea most every morning regardless of stomach ache. Zofran 3 days a week. Occasional vomiting. Occurs in the middle of the night. Small amount. No hematemesis. Not eating within 3 hours of going to bed.   Took carafate a couple nights ago when she woke up with nausea. This helped and she felt better when she woke up. Used to take carafate TID and at bedtime.   BMs 4-5 days a week. Taking Motegrity BID. States she was told to take it twice a day. Stools are soft. No blood in the stool.  No black stool.  Feels this has also helped her abdominal pain slightly. Not occurring as frequently.   Weight is down 7 lbs in the last 1.5 months.  Last A1C was 9.2 in March 2021. Has appt with endocrinologist next month.  Eating once a day. Had grilled salmon, grilled zucchini, and rice. Trying to eat small meals. Had fried fish last week. No raw vegetables or salads.    She is convinced her symptoms are related to her gallbladder an mentions this several times throughout the visit. I explained that I suspect her symptoms may be related to gastroparesis, but she states she  still needs to see the surgeon to discuss getting her gallbladder removed.    Past Medical History:  Diagnosis Date  . Anxiety   . COPD (chronic obstructive pulmonary disease) ([place])   . Dementia ([place]) 09/30/2019   per patient, early dementia diagnosed after seeing PCP recently  . Depression   . Diabetes mellitus, type II ([place])   .  GERD (gastroesophageal reflux disease)   . Glaucoma   . Hyperlipidemia   . Hypertension   . Migraines   . Neuropathy   . Seizures ([place])    seizure was from ETOH, "a long time ago", no med and no recurrance  . Vitamin D deficiency     Past Surgical History:  Procedure Laterality Date  . ABDOMINAL HYSTERECTOMY    . CATARACT EXTRACTION W/PHACO Left 07/24/2019   Procedure: CATARACT EXTRACTION PHACO AND INTRAOCULAR LENS PLACEMENT LEFT EYE  (CDE: 5.60);  Surgeon: Baruch Goldmann, MD;  Location: [facility];  Service: Ophthalmology;  Laterality: Left;  . CATARACT EXTRACTION W/PHACO Right 08/19/2019   Procedure: CATARACT EXTRACTION PHACO AND INTRAOCULAR LENS PLACEMENT RIGHT EYE;  Surgeon: Baruch Goldmann, MD;  Location: [facility];  Service: Ophthalmology;  Laterality: Right;  CDE: 8.83  . COLONOSCOPY  11/2015   Dr. Britta Mccreedy: sessile polyp removed (benign). advised repeat colonoscopy in 5 years.   . COLONOSCOPY WITH PROPOFOL N/A 10/26/2017   RMR: Tubular adenoma removed.  Random colon biopsies negative.  Nonbleeding internal hemorrhoids.  Next colonoscopy in 5 years.  . ESOPHAGEAL DILATION  10/26/2017   Procedure: ESOPHAGEAL DILATION;  Surgeon: Daneil Dolin, MD;  Location: [facility];  Service: Endoscopy;;  . ESOPHAGOGASTRODUODENOSCOPY  11/2015   Dr. Britta Mccreedy: hiatal hernia  . ESOPHAGOGASTRODUODENOSCOPY (EGD) WITH PROPOFOL N/A 10/26/2017   RMR: Erosive reflux esophagitis, hiatal hernia.  Small bowel biopsies negative.  . INSERTION OF ANTERIOR SEGMENT AQUEOUS DRAINAGE DEVICE (ISTENT) Left 07/24/2019   Procedure: INSERTION OF ANTERIOR SEGMENT AQUEOUS DRAINAGE DEVICE (ISTENT) LEFT EYE;  Surgeon: Baruch Goldmann, MD;  Location: [facility];  Service: Ophthalmology;  Laterality: Left;  . INSERTION OF ANTERIOR SEGMENT AQUEOUS DRAINAGE DEVICE (ISTENT) Right 08/19/2019   Procedure: INSERTION OF ANTERIOR SEGMENT AQUEOUS DRAINAGE DEVICE (ISTENT) RIGHT EYE;  Surgeon: Baruch Goldmann, MD;  Location: [facility];  Service:  Ophthalmology;  Laterality: Right;  . KNEE ARTHROSCOPY Right   . ORIF ANKLE FRACTURE Right     Current Outpatient Medications  Medication Sig Dispense Refill  . acetaminophen (TYLENOL) 500 MG tablet Take 1,000 mg by mouth every 6 (six) hours as needed (for pain/headaches.).    Marland Kitchen albuterol (VENTOLIN HFA) 108 (90 Base) MCG/ACT inhaler Inhale 1-2 puffs into the lungs every 6 (six) hours as needed for wheezing or shortness of breath.    Marland Kitchen amLODipine (NORVASC) 2.5 MG tablet Take 2.5 mg by mouth daily.    Marland Kitchen aspirin EC 81 MG tablet Take 81 mg by mouth daily.    . Blood Glucose Monitoring Suppl (ONETOUCH VERIO) w/Device KIT 1 each by Does not apply route 4 (four) times daily. E11.65 1 kit 0  . Calcium Carb-Cholecalciferol (CALCIUM 500 + D3 PO) Take 1 tablet by mouth daily.    . Cholecalciferol (VITAMIN D3) 2000 units TABS Take 2,000 Units by mouth daily.    . citalopram (CELEXA) 40 MG tablet Take 40 mg by mouth daily.    . Continuous Blood Gluc Sensor (FREESTYLE LIBRE 14 DAY SENSOR) MISC Inject 1 each into the skin every 14 (fourteen) days. Use as directed. 2 each 2  .  dexlansoprazole (DEXILANT) 60 MG capsule Take 1 capsule (60 mg total) by mouth daily. 90 capsule 3  . docusate sodium (COLACE) 100 MG capsule Take 100 mg by mouth 2 (two) times daily as needed for mild constipation.    Marland Kitchen donepezil (ARICEPT) 5 MG tablet Take 5 mg by mouth at bedtime.    . eszopiclone (LUNESTA) 2 MG TABS tablet Take 2 mg by mouth at bedtime as needed for sleep. Take immediately before bedtime    . fenofibrate (TRICOR) 145 MG tablet TAKE 1 TABLET ONCE DAILY. 90 tablet 0  . gabapentin (NEURONTIN) 800 MG tablet Take 800 mg by mouth 4 (four) times daily.     Marland Kitchen glipiZIDE (GLUCOTROL XL) 5 MG 24 hr tablet Take 1 tablet (5 mg total) by mouth daily with breakfast. 30 tablet 3  . insulin detemir (LEVEMIR FLEXTOUCH) 100 UNIT/ML FlexPen Inject 60 Units into the skin 2 (two) times daily. 60 mL 2  . insulin lispro (HUMALOG) 100  UNIT/ML KwikPen Inject 0.2-0.26 mLs (20-26 Units total) into the skin 3 (three) times daily before meals. 30 mL 0  . Insulin Pen Needle (PEN NEEDLES) 31G X 6 MM MISC 1 each by Does not apply route 4 (four) times daily. 400 each 1  . lisinopril-hydrochlorothiazide (PRINZIDE,ZESTORETIC) 20-12.5 MG tablet Take 2 tablets by mouth daily.     . ondansetron (ZOFRAN) 8 MG tablet Take 1 tablet (8 mg total) by mouth every 8 (eight) hours as needed for nausea or vomiting. 30 tablet 3  . ONETOUCH VERIO test strip USE 1 STRIP TO CHECK GLUCOSE 4 TIMES DAILY. 150 strip 0  . pravastatin (PRAVACHOL) 40 MG tablet Take 40 mg by mouth daily.    . Prucalopride Succinate (MOTEGRITY) 2 MG TABS Take 1 tablet (2 mg total) by mouth daily. 30 tablet 3  . pyridOXINE (VITAMIN B-6) 100 MG tablet Take 100 mg by mouth daily.    . Sennosides (EX-LAX PO) Take by mouth as needed.    . sucralfate (CARAFATE) 1 g tablet Take 1 tablet (1 g total) by mouth 4 (four) times daily as needed (crush and mix with water as needed for upset stomach). 90 tablet 3  . vitamin B-12 (CYANOCOBALAMIN) 500 MCG tablet Take 500 mcg by mouth daily.     No current facility-administered medications for this visit.    Allergies as of 12/16/2019 - Review Complete 12/16/2019  Allergen Reaction Noted  . Sulfa antibiotics Other (See Comments) 03/04/2016    Family History  Problem Relation Age of Onset  . Hypertension Mother   . Colon cancer Mother        diagnosed early 11s  . Cerebral aneurysm Mother   . Alcohol abuse Father   . Pancreatic cancer Sister        deceased in 35s  . Colon cancer Sister        diagnosed 27 y/o  . Stroke Brother   . Cancer Maternal Grandmother   . Cancer Maternal Grandfather   . Cancer Paternal Grandmother   . Other Paternal Grandfather   . Other Brother     Social History   Socioeconomic History  . Marital status: Single    Spouse name: Not on file  . Number of children: Not on file  . Years of education:  Not on file  . Highest education level: Not on file  Occupational History  . Not on file  Tobacco Use  . Smoking status: Never Smoker  . Smokeless tobacco: Never Used  Substance and Sexual Activity  . Alcohol use: No  . Drug use: No  . Sexual activity: Never  Other Topics Concern  . Not on file  Social History Narrative  . Not on file   Social Determinants of Health   Financial Resource Strain:   . Difficulty of Paying Living Expenses:   Food Insecurity:   . Worried About Charity fundraiser in the Last Year:   . Arboriculturist in the Last Year:   Transportation Needs:   . Film/video editor (Medical):   Marland Kitchen Lack of Transportation (Non-Medical):   Physical Activity:   . Days of Exercise per Week:   . Minutes of Exercise per Session:   Stress:   . Feeling of Stress :   Social Connections:   . Frequency of Communication with Friends and Family:   . Frequency of Social Gatherings with Friends and Family:   . Attends Religious Services:   . Active Member of Clubs or Organizations:   . Attends Archivist Meetings:   Marland Kitchen Marital Status:     Review of Systems: Gen: Denies fever, chills, cold or flulike symptoms, lightheadedness, dizziness, presyncope, syncope CV: Denies chest pain or palpitations. Resp: Denies shortness of breath or cough. GI: See HPI Heme: See HPI.  Physical Exam: BP (!) 159/95   Pulse (!) 101   Temp (!) 97.3 F (36.3 C) (Oral)   Ht 5' 2"  (1.575 m)   Wt 193 lb 3.2 oz (87.6 kg)   BMI 35.34 kg/m  General:   Alert and oriented. No distress noted. Pleasant and cooperative.  Head:  Normocephalic and atraumatic. Eyes:  Conjuctiva clear without scleral icterus. Heart:  S1, S2 present without murmurs appreciated. Lungs:  Clear to auscultation bilaterally. No wheezes, rales, or rhonchi. No distress.  Abdomen:  Obese, protuberant abdomen. +BS, soft and non-tense.  Minimal tenderness to palpation in the epigastric area. No rebound or guarding.  No HSM or masses noted. Msk:  Symmetrical without gross deformities. Normal posture. Extremities:  Without edema. Neurologic:  Alert and  oriented x4 Psych: Normal mood and affect.

## 2019-12-16 ENCOUNTER — Ambulatory Visit (INDEPENDENT_AMBULATORY_CARE_PROVIDER_SITE_OTHER): Payer: Medicare HMO | Admitting: Gastroenterology

## 2019-12-16 ENCOUNTER — Other Ambulatory Visit: Payer: Self-pay

## 2019-12-16 ENCOUNTER — Telehealth: Payer: Self-pay | Admitting: Gastroenterology

## 2019-12-16 ENCOUNTER — Encounter: Payer: Self-pay | Admitting: Gastroenterology

## 2019-12-16 VITALS — BP 159/95 | HR 101 | Temp 97.3°F | Ht 62.0 in | Wt 193.2 lb

## 2019-12-16 DIAGNOSIS — R112 Nausea with vomiting, unspecified: Secondary | ICD-10-CM

## 2019-12-16 DIAGNOSIS — K5900 Constipation, unspecified: Secondary | ICD-10-CM | POA: Diagnosis not present

## 2019-12-16 DIAGNOSIS — K219 Gastro-esophageal reflux disease without esophagitis: Secondary | ICD-10-CM

## 2019-12-16 DIAGNOSIS — R1013 Epigastric pain: Secondary | ICD-10-CM

## 2019-12-16 MED ORDER — SUCRALFATE 1 G PO TABS: 1.0000 g | ORAL_TABLET | Freq: Four times a day (QID) | ORAL | 3 refills | Status: DC | PRN

## 2019-12-16 NOTE — Assessment & Plan Note (Addendum)
Chronic history of constipation.  Historically Linzess caused loose stools and incontinence.  Had been on Amitiza 8 mcg twice daily but this was recently switched to a trial of Motegrity.  Patient is currently taking Motegrity 2 mg twice a day as she reports she was told to take this twice a day.  However, prescription is written for 2 mg once daily which is how the medication should be taken.  She does feel Motegrity has helped with her constipation that she is currently having 4-5 soft formed BMs daily.   She was advised to decrease Motegrity to once daily as this is how the medication is approved to be prescribed.  Advised to let me know if she has any worsening of constipation with decreasing Motegrity to once daily.  We may need to consider changing medications.  Could try higher dose of Amitiza.

## 2019-12-16 NOTE — Assessment & Plan Note (Addendum)
Intermittent epigastric pain occurring about 3 days a week.  Patient reports pain can stay for days.  Denies any identified food triggers but does note mild worsening with fatty meals.  Also notes she tends to do better with soft foods rather than solid foods.  Additional symptoms include chronic frequent nocturnal/morning nausea, occasional nocturnal low-volume emesis.  Also with chronic constipation, recently on trial of Motegrity which she does feel has helped with constipation and decreased the frequency of her upper abdominal pain.  GERD is well controlled on Dexilant.  EGD in 2019 with erosive reflux esophagitis, hiatal hernia.  GES in February 2021 with borderline delayed gastric emptying.  RUQ ultrasound March 2021 without gallstones or wall thickening.  CBD slightly prominent but LFTs within normal limits.  Follow-up HIDA scan May 2021 within normal limits.   High suspicion is for gastroparesis in the setting of uncontrolled diabetes with most recent A1c 9.2 in March 2021.  It is possible that she could also have esophagitis or gastritis contributing.  Notably, she did report improvement in nausea after trying Carafate a couple days ago.  Underlying constipation could have also been contributing.  Patient is convinced of gallbladder etiology and is currently scheduled to see Dr. Arnoldo Morale tomorrow. Of note, after patient left office, I realized her potassium was low at 2.9 on her last labs on 4/29. Not sure anything has been done about this or if it has been rechecked.   Recheck BMP. Sending telephone note to nurse to arrange.  Discussed trial of low-dose Reglan.  After discussing possible side effects, patient prefers to hold off on this and follow-up with Dr. Arnoldo Morale first. As she noted improvement with Carafate recently, I have refilled her prescription and advised her to resume this at least nightly but may take it up to 3 times daily and at bedtime. Counseled on gastroparesis diet.  Handout  provided. Continue Dexilant 60 mg daily. Continue Zofran as needed. Unfortunately, patient is taking Motegrity twice daily rather than once daily which is the approved dosing.  Advised to decrease Motegrity to daily and let me know if she has worsening constipation.  We may have to try a different medication.  Consider higher Amitiza 24 mcg twice daily as 8 mcg was not working well. Counseled on importance of gaining better control of her diabetes.  Follow-up in 4 months.  Patient will call after she sees Dr. Arnoldo Morale if she desires to trial Reglan.

## 2019-12-16 NOTE — Assessment & Plan Note (Addendum)
Chronic history of nausea.  Nausea typically occurring most mornings and occasionally in the middle of the night.  Associated with occasional vomiting without hematemesis.  Also with intermittent epigastric pain about 3 days a week not always associated with nausea. Interestingly, she tried carafate a couple of nights ago and had improvement in her nausea and felt well when she woke up the following morning. GERD symptoms well controlled with Dexilant daily.   Highest suspicion that upper GI symptoms may be secondary to to delayed gastric emptying/gastroparesis in the setting of uncontrolled diabetes. Most recent A1C 9.2 in March 2021. GES in February 2021 with borderline delayed gastric emptying.  Considering EGD in April 2019 with erosive reflux esophagitis, small hiatal hernia, she could have esophagitis/gastritis contributing; however, GERD symptoms are well controlled on Dexilant at this time. Less likely biliary etiology with RUQ ultrasound March 2021 without gallstones or wall thickening.  CBD slightly prominent at 6 mm but LFTs within normal limits.  Follow-up HIDA scan within normal limits. However, patient feels symptoms are related to her gallbladder and is currently scheduled to see Dr. Arnoldo Morale tomorrow. Of note, after patient left office, I realized her potassium was low at 2.9 on her last labs on 4/29. Not sure anything has been done about this or if it has been rechecked.   Recheck BMP. Sending telephone note to nurse to arrange.   Discussed trying low-dose Reglan to help with gastric emptying.  After discussing side effects of Reglan, patient preferred to see Dr. Arnoldo Morale tomorrow to discuss her gallbladder and then would follow-up with [place] thereafter. I will go ahead and refill her Carafate and have her resume taking this at least at night as nausea is worse at night/mornings but she may take this up to 3 times daily with meals and at bedtime. Counseled on gastroparesis diet.  Handout  provided. Continue Zofran as needed. Follow-up in 4 months.  Call with questions or concerns prior.

## 2019-12-16 NOTE — Telephone Encounter (Signed)
Carrie Turner, can you please arrange for patient to have repeat BMP. Per chart review, her potassium was low at 2.9 on 4/29. Not sure if anything was done about this. Would like to recheck.

## 2019-12-16 NOTE — Patient Instructions (Addendum)
Resume taking Carafate daily.  You may take this up to 3 times daily with meals and at bedtime.  I recommend you start at least taking this every night before bed as you felt this helps with your nausea.  Continue taking Dexilant 60 mg daily.  Continue Zofran as needed.   Decrease Motegrity to once daily.  Let me know if you continue having good bowel movements or if your constipation returns.  We may need to try a different medication.  I suspect your upper abdominal pain and nausea with occasional vomiting is likely related to underlying gastroparesis. As we discussed today, I am considering trying you on a medication called Reglan to help improve your stomach emptying.  As you requested, I will hold off on starting this medication until you have seen Dr. Arnoldo Morale tomorrow.  Please call our office on Wednesday or Thursday to follow-up and determine whether or not you want to try Reglan.  It is very important that you follow a strict gastroparesis diet.  This includes a low-fat, low fiber diet.  Avoid all fried/fatty foods.  No raw vegetables.  Limit fresh fruits.  Eats 4-6 small meals daily.  With gastroparesis, it is very important to maintain tight control of your blood sugars as diabetes/elevated blood sugars will cause delayed stomach emptying.  We will plan to see you back in 4 months.  Again please call me in 1 week to let me know how Motegrity once daily is doing.  Aliene Altes, PA-C [facility]

## 2019-12-16 NOTE — Assessment & Plan Note (Signed)
Well-controlled on Dexilant 60 mg daily.  Continue current medications.

## 2019-12-16 NOTE — Telephone Encounter (Signed)
Noted  

## 2019-12-17 ENCOUNTER — Other Ambulatory Visit: Payer: Self-pay | Admitting: Emergency Medicine

## 2019-12-17 ENCOUNTER — Ambulatory Visit (INDEPENDENT_AMBULATORY_CARE_PROVIDER_SITE_OTHER): Payer: Medicare HMO | Admitting: General Surgery

## 2019-12-17 ENCOUNTER — Encounter: Payer: Self-pay | Admitting: General Surgery

## 2019-12-17 VITALS — BP 170/74 | HR 102 | Temp 96.6°F | Resp 18 | Ht 62.0 in | Wt 194.0 lb

## 2019-12-17 DIAGNOSIS — E876 Hypokalemia: Secondary | ICD-10-CM

## 2019-12-17 DIAGNOSIS — K8050 Calculus of bile duct without cholangitis or cholecystitis without obstruction: Secondary | ICD-10-CM

## 2019-12-17 NOTE — Patient Instructions (Signed)
Biliary Colic, Adult  Biliary colic is severe pain caused by a problem with a small organ in the upper right part of your belly (gallbladder). The gallbladder stores a digestive fluid produced in the liver (bile) that helps the body break down fat. Bile and other digestive enzymes are carried from the liver to the small intestine through tube-like structures (bile ducts). The gallbladder and the bile ducts form the biliary tract. Sometimes hard deposits of digestive fluids form in the gallbladder (gallstones) and block the flow of bile from the gallbladder, causing biliary colic. This condition is also called a gallbladder attack. Gallstones can be as small as a grain of sand or as big as a golf ball. There could be just one gallstone in the gallbladder, or there could be many. What are the causes? Biliary colic is usually caused by gallstones. Less often, a tumor could block the flow of bile from the gallbladder and trigger biliary colic. What increases the risk? This condition is more likely to develop in:  Women.  People of Hispanic descent.  People with a family history of gallstones.  People who are obese.  People who suddenly or quickly lose weight.  People who eat a high-calorie, low-fiber diet that is rich in refined carbs (carbohydrates), such as white bread and white rice.  People who have an intestinal disease that affects nutrient absorption, such as Crohn disease.  People who have a metabolic condition, such as metabolic syndrome or diabetes. What are the signs or symptoms? Severe pain in the upper right side of the belly is the main symptom of biliary colic. You may feel this pain below the chest but above the hip. This pain often occurs at night or after eating a very fatty meal. This pain may get worse for up to an hour and last as long as 12 hours. In most cases, the pain fades (subsides) within a couple hours. Other symptoms of this condition include:  Nausea and  vomiting.  Pain under the right shoulder. How is this diagnosed? This condition is diagnosed based on your medical history, your symptoms, and a physical exam. You may have tests, including:  Blood tests to rule out infection or inflammation of the bile ducts, gallbladder, pancreas, or liver.  Imaging studies such as: ? Ultrasound. ? CT scan. ? MRI. In some cases, you may need to have an imaging study done using a small amount of radioactive material (nuclear medicine) to confirm the diagnosis. How is this treated? Treatment for this condition may include medicine to relieve your pain or nausea. If you have gallstones that are causing biliary colic, you may need surgery to remove the gallbladder (cholecystectomy). Gallstones can also be dissolved gradually with medicine. It may take months or years before the gallstones are completely gone. Follow these instructions at home:  Take over-the-counter and prescription medicines only as told by your health care provider.  Drink enough fluid to keep your urine clear or pale yellow.  Follow instructions from your health care provider about eating or drinking restrictions. These may include avoiding: ? Fatty, greasy, and fried foods. ? Any foods that make the pain worse. ? Overeating. ? Having a large meal after not eating for a while.  Keep all follow-up visits as told by your health care provider. This is important. How is this prevented? Steps to prevent this condition include:  Maintaining a healthy body weight.  Getting regular exercise.  Eating a healthy, high-fiber, low-fat diet.  Limiting how much   sugar and refined carbs you eat, such as sweets, white flour, and white rice. Contact a health care provider if:  Your pain lasts more than 5 hours.  You vomit.  You have a fever and chills.  Your pain gets worse. Get help right away if:  Your skin or the whites of your eyes look yellow (jaundice).  Your have tea-colored  urine and light-colored stools.  You are dizzy or you faint. Summary  Biliary colic is severe pain caused by a problem with a small organ in the upper right part of your belly (gallbladder).  Treatments for this condition include medicines that relieves your pain or nausea and medicines that slowly dissolves the gallstones.  If gallstones cause your biliary colic, the treatment is surgery to remove the gallbladder (cholecystectomy). This information is not intended to replace advice given to you by your health care provider. Make sure you discuss any questions you have with your health care provider. Document Revised: 06/23/2017 Document Reviewed: 01/25/2016 Elsevier Patient Education  2020 [employer]  

## 2019-12-17 NOTE — Telephone Encounter (Signed)
Called pt no voice mail, unable to leave message Order placed in epic for bmp

## 2019-12-17 NOTE — Progress Notes (Signed)
Cc'ed to pcp °

## 2019-12-18 ENCOUNTER — Ambulatory Visit: Payer: Medicare HMO | Admitting: Gastroenterology

## 2019-12-18 NOTE — Telephone Encounter (Signed)
Noted  

## 2019-12-18 NOTE — H&P (Signed)
Carrie Turner; [id number]; 10/18/1950   HPI Patient is a 68-year-old white female who was referred to my care by Dr. Shah and Dr. Rourk for evaluation treatment of biliary colic.  Patient has had longstanding history of early satiety, bloating, nausea, and epigastric pain.  She also states the pain seems to be along the right side of her abdomen.  Her ultrasound was negative for cholelithiasis.  She did have an hepatobiliary scan recently which showed normal ejection fraction, but she did have reproducible symptoms with a fatty meal.  She also has a known history of borderline gastroparesis.  She denies any fever, chills, or jaundice.  She currently has 0 out of 10 abdominal pain.  She states she does avoid certain fatty foods that seem to reproduce her symptoms. Past Medical History:  Diagnosis Date  . Anxiety   . COPD (chronic obstructive pulmonary disease) (HCC)   . Dementia (HCC) 09/30/2019   per patient, early dementia diagnosed after seeing PCP recently  . Depression   . Diabetes mellitus, type II (HCC)   . GERD (gastroesophageal reflux disease)   . Glaucoma   . Hyperlipidemia   . Hypertension   . Migraines   . Neuropathy   . 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 Vitamin D deficiency     Past Surgical History:  Procedure Laterality Date  . ABDOMINAL HYSTERECTOMY    . CATARACT EXTRACTION W/PHACO Left 07/24/2019   Procedure: CATARACT EXTRACTION PHACO AND INTRAOCULAR LENS PLACEMENT LEFT EYE  (CDE: 5.60);  Surgeon: Wrzosek, James, MD;  Location: [facility];  Service: Ophthalmology;  Laterality: Left;  . CATARACT EXTRACTION W/PHACO Right 08/19/2019   Procedure: CATARACT EXTRACTION PHACO AND INTRAOCULAR LENS PLACEMENT RIGHT EYE;  Surgeon: Wrzosek, James, MD;  Location: [facility];  Service: Ophthalmology;  Laterality: Right;  CDE: 8.83  . COLONOSCOPY  11/2015   Dr. Benson: sessile polyp removed (benign). advised repeat colonoscopy in 5 years.    . COLONOSCOPY WITH PROPOFOL N/A 10/26/2017   RMR: Tubular adenoma removed.  Random colon biopsies negative.  Nonbleeding internal hemorrhoids.  Next colonoscopy in 5 years.  . ESOPHAGEAL DILATION  10/26/2017   Procedure: ESOPHAGEAL DILATION;  Surgeon: Rourk, Robert M, MD;  Location: [facility];  Service: Endoscopy;;  . ESOPHAGOGASTRODUODENOSCOPY  11/2015   Dr. Benson: hiatal hernia  . ESOPHAGOGASTRODUODENOSCOPY (EGD) WITH PROPOFOL N/A 10/26/2017   RMR: Erosive reflux esophagitis, hiatal hernia.  Small bowel biopsies negative.  . INSERTION OF ANTERIOR SEGMENT AQUEOUS DRAINAGE DEVICE (ISTENT) Left 07/24/2019   Procedure: INSERTION OF ANTERIOR SEGMENT AQUEOUS DRAINAGE DEVICE (ISTENT) LEFT EYE;  Surgeon: Wrzosek, James, MD;  Location: [facility];  Service: Ophthalmology;  Laterality: Left;  . INSERTION OF ANTERIOR SEGMENT AQUEOUS DRAINAGE DEVICE (ISTENT) Right 08/19/2019   Procedure: INSERTION OF ANTERIOR SEGMENT AQUEOUS DRAINAGE DEVICE (ISTENT) RIGHT EYE;  Surgeon: Wrzosek, James, MD;  Location: [facility];  Service: Ophthalmology;  Laterality: Right;  . KNEE ARTHROSCOPY Right   . ORIF ANKLE FRACTURE Right     Family History  Problem Relation Age of Onset  . Hypertension Mother   . Colon cancer Mother        diagnosed early 40s  . Cerebral aneurysm Mother   . Alcohol abuse Father   . Pancreatic cancer Sister        deceased in 40s  . Colon cancer Sister        diagnosed 18 y/o  . Stroke   Brother   . Cancer Maternal Grandmother   . Cancer Maternal Grandfather   . Cancer Paternal Grandmother   . Other Paternal Grandfather   . Other Brother     Current Outpatient Medications on File Prior to Visit  Medication Sig Dispense Refill  . albuterol (VENTOLIN HFA) 108 (90 Base) MCG/ACT inhaler Inhale 1-2 puffs into the lungs every 6 (six) hours as needed for wheezing or shortness of breath.    . amLODipine (NORVASC) 5 MG tablet Take 5 mg by mouth daily.     . aspirin EC 81 MG tablet Take 81 mg by mouth  daily.    . Blood Glucose Monitoring Suppl (ONETOUCH VERIO) w/Device KIT 1 each by Does not apply route 4 (four) times daily. E11.65 1 kit 0  . Cholecalciferol (VITAMIN D3) 2000 units TABS Take 2,000 Units by mouth daily.    . citalopram (CELEXA) 40 MG tablet Take 40 mg by mouth daily.    . Continuous Blood Gluc Sensor (FREESTYLE LIBRE 14 DAY SENSOR) MISC Inject 1 each into the skin every 14 (fourteen) days. Use as directed. 2 each 2  . dexlansoprazole (DEXILANT) 60 MG capsule Take 1 capsule (60 mg total) by mouth daily. 90 capsule 3  . donepezil (ARICEPT) 5 MG tablet Take 5 mg by mouth at bedtime.    . eszopiclone (LUNESTA) 2 MG TABS tablet Take 2 mg by mouth at bedtime as needed for sleep. Take immediately before bedtime    . fenofibrate (TRICOR) 145 MG tablet TAKE 1 TABLET ONCE DAILY. (Patient taking differently: Take 145 mg by mouth daily. ) 90 tablet 0  . gabapentin (NEURONTIN) 800 MG tablet Take 800 mg by mouth 3 (three) times daily.     . glipiZIDE (GLUCOTROL XL) 5 MG 24 hr tablet Take 1 tablet (5 mg total) by mouth daily with breakfast. (Patient not taking: Reported on 12/18/2019) 30 tablet 3  . insulin detemir (LEVEMIR FLEXTOUCH) 100 UNIT/ML FlexPen Inject 60 Units into the skin 2 (two) times daily. (Patient taking differently: Inject 60 Units into the skin at bedtime. ) 60 mL 2  . insulin lispro (HUMALOG) 100 UNIT/ML KwikPen Inject 0.2-0.26 mLs (20-26 Units total) into the skin 3 (three) times daily before meals. 30 mL 0  . Insulin Pen Needle (PEN NEEDLES) 31G X 6 MM MISC 1 each by Does not apply route 4 (four) times daily. 400 each 1  . lisinopril-hydrochlorothiazide (PRINZIDE,ZESTORETIC) 20-12.5 MG tablet Take 2 tablets by mouth daily.     . ondansetron (ZOFRAN) 8 MG tablet Take 1 tablet (8 mg total) by mouth every 8 (eight) hours as needed for nausea or vomiting. (Patient not taking: Reported on 12/18/2019) 30 tablet 3  . ONETOUCH VERIO test strip USE 1 STRIP TO CHECK GLUCOSE 4 TIMES  DAILY. 150 strip 0  . pravastatin (PRAVACHOL) 40 MG tablet Take 40 mg by mouth daily.    . Prucalopride Succinate (MOTEGRITY) 2 MG TABS Take 1 tablet (2 mg total) by mouth daily. 30 tablet 3  . pyridOXINE (VITAMIN B-6) 100 MG tablet Take 100 mg by mouth daily.    . sucralfate (CARAFATE) 1 g tablet Take 1 tablet (1 g total) by mouth 4 (four) times daily as needed (crush and mix with water as needed for upset stomach). (Patient not taking: Reported on 12/18/2019) 90 tablet 3  . vitamin B-12 (CYANOCOBALAMIN) 500 MCG tablet Take 500 mcg by mouth daily.     No current facility-administered medications on file prior to visit.      Allergies  Allergen Reactions  . Sulfa Antibiotics Other (See Comments)    Weakness and fatigued     Social History   Substance and Sexual Activity  Alcohol Use No    Social History   Tobacco Use  Smoking Status Never Smoker  Smokeless Tobacco Never Used    Review of Systems  Constitutional: Positive for malaise/fatigue.  HENT: Positive for sinus pain.   Eyes: Positive for blurred vision and pain.  Respiratory: Positive for shortness of breath.   Cardiovascular: Negative.   Gastrointestinal: Positive for abdominal pain, heartburn, nausea and vomiting.  Genitourinary: Positive for dysuria, frequency and urgency.  Musculoskeletal: Positive for joint pain.  Skin: Negative.   Neurological: Positive for headaches.  Endo/Heme/Allergies: Negative.   Psychiatric/Behavioral: Negative.     Objective   Vitals:   12/17/19 1352  BP: (!) 170/74  Pulse: (!) 102  Resp: 18  Temp: (!) 96.6 F (35.9 C)  SpO2: 92%    Physical Exam Vitals reviewed.  Constitutional:      Appearance: Normal appearance. She is not ill-appearing.  HENT:     Head: Normocephalic and atraumatic.  Eyes:     General: No scleral icterus. Cardiovascular:     Rate and Rhythm: Normal rate and regular rhythm.     Heart sounds: Normal heart sounds. No murmur. No friction rub. No  gallop.   Pulmonary:     Effort: Pulmonary effort is normal. No respiratory distress.     Breath sounds: Normal breath sounds. No stridor. No wheezing, rhonchi or rales.  Abdominal:     General: Bowel sounds are normal. There is no distension.     Palpations: Abdomen is soft. There is no mass.     Tenderness: There is abdominal tenderness. There is no guarding or rebound.     Hernia: No hernia is present.     Comments: Some discomfort to deep palpation in the right upper quadrant and epigastric regions.  Skin:    General: Skin is warm and dry.  Neurological:     Mental Status: She is alert and oriented to person, place, and time.   GI notes reviewed, x-ray reports reviewed  Assessment   Biliary colic, possible early onset gastroparesis, multiple comorbidities  Plan   Patient understands that the cholecystectomy may not fully resolve all her symptoms.  She does have evidence of biliary colic and thus we will proceed with a laparoscopic cholecystectomy.  The risks and benefits of the procedure including bleeding, infection, hepatobiliary injury, the possibility of an open procedure were fully explained to the patient, who gave informed consent. 

## 2019-12-18 NOTE — Telephone Encounter (Signed)
Called verified name and dob Pt notified or results and recommendations Pt stated she will have labs drawn at [facility] on 5/28. Pt is having several labs done due to gallbladder surgery

## 2019-12-18 NOTE — Progress Notes (Signed)
Carrie Turner; [id number]; 25-Jun-1951   HPI Patient is a 69 year old white female who was referred to my care by Dr. Manuella Ghazi and Dr. Gala Romney for evaluation treatment of biliary colic.  Patient has had longstanding history of early satiety, bloating, nausea, and epigastric pain.  She also states the pain seems to be along the right side of her abdomen.  Her ultrasound was negative for cholelithiasis.  She did have an hepatobiliary scan recently which showed normal ejection fraction, but she did have reproducible symptoms with a fatty meal.  She also has a known history of borderline gastroparesis.  She denies any fever, chills, or jaundice.  She currently has 0 out of 10 abdominal pain.  She states she does avoid certain fatty foods that seem to reproduce her symptoms. Past Medical History:  Diagnosis Date  . Anxiety   . COPD (chronic obstructive pulmonary disease) ([place])   . Dementia ([place]) 09/30/2019   per patient, early dementia diagnosed after seeing PCP recently  . Depression   . Diabetes mellitus, type II ([place])   . GERD (gastroesophageal reflux disease)   . Glaucoma   . Hyperlipidemia   . Hypertension   . Migraines   . Neuropathy   . Seizures ([place])    seizure was from ETOH, "a long time ago", no med and no recurrance  . Vitamin D deficiency     Past Surgical History:  Procedure Laterality Date  . ABDOMINAL HYSTERECTOMY    . CATARACT EXTRACTION W/PHACO Left 07/24/2019   Procedure: CATARACT EXTRACTION PHACO AND INTRAOCULAR LENS PLACEMENT LEFT EYE  (CDE: 5.60);  Surgeon: Baruch Goldmann, MD;  Location: [facility];  Service: Ophthalmology;  Laterality: Left;  . CATARACT EXTRACTION W/PHACO Right 08/19/2019   Procedure: CATARACT EXTRACTION PHACO AND INTRAOCULAR LENS PLACEMENT RIGHT EYE;  Surgeon: Baruch Goldmann, MD;  Location: [facility];  Service: Ophthalmology;  Laterality: Right;  CDE: 8.83  . COLONOSCOPY  11/2015   Dr. Britta Mccreedy: sessile polyp removed (benign). advised repeat colonoscopy in 5 years.    . COLONOSCOPY WITH PROPOFOL N/A 10/26/2017   RMR: Tubular adenoma removed.  Random colon biopsies negative.  Nonbleeding internal hemorrhoids.  Next colonoscopy in 5 years.  . ESOPHAGEAL DILATION  10/26/2017   Procedure: ESOPHAGEAL DILATION;  Surgeon: Daneil Dolin, MD;  [facility];  Service: Endoscopy;;  . ESOPHAGOGASTRODUODENOSCOPY  11/2015   Dr. Britta Mccreedy: hiatal hernia  . ESOPHAGOGASTRODUODENOSCOPY (EGD) WITH PROPOFOL N/A 10/26/2017   RMR: Erosive reflux esophagitis, hiatal hernia.  Small bowel biopsies negative.  . INSERTION OF ANTERIOR SEGMENT AQUEOUS DRAINAGE DEVICE (ISTENT) Left 07/24/2019   Procedure: INSERTION OF ANTERIOR SEGMENT AQUEOUS DRAINAGE DEVICE (ISTENT) LEFT EYE;  Surgeon: Baruch Goldmann, MD;  Location: [facility];  Service: Ophthalmology;  Laterality: Left;  . INSERTION OF ANTERIOR SEGMENT AQUEOUS DRAINAGE DEVICE (ISTENT) Right 08/19/2019   Procedure: INSERTION OF ANTERIOR SEGMENT AQUEOUS DRAINAGE DEVICE (ISTENT) RIGHT EYE;  Surgeon: Baruch Goldmann, MD;  Location: [facility];  Service: Ophthalmology;  Laterality: Right;  . KNEE ARTHROSCOPY Right   . ORIF ANKLE FRACTURE Right     Family History  Problem Relation Age of Onset  . Hypertension Mother   . Colon cancer Mother        diagnosed early 6s  . Cerebral aneurysm Mother   . Alcohol abuse Father   . Pancreatic cancer Sister        deceased in 96s  . Colon cancer Sister        diagnosed 32 y/o  . Stroke  Brother   . Cancer Maternal Grandmother   . Cancer Maternal Grandfather   . Cancer Paternal Grandmother   . Other Paternal Grandfather   . Other Brother     Current Outpatient Medications on File Prior to Visit  Medication Sig Dispense Refill  . albuterol (VENTOLIN HFA) 108 (90 Base) MCG/ACT inhaler Inhale 1-2 puffs into the lungs every 6 (six) hours as needed for wheezing or shortness of breath.    Marland Kitchen amLODipine (NORVASC) 5 MG tablet Take 5 mg by mouth daily.     Marland Kitchen aspirin EC 81 MG tablet Take 81 mg by mouth  daily.    . Blood Glucose Monitoring Suppl (ONETOUCH VERIO) w/Device KIT 1 each by Does not apply route 4 (four) times daily. E11.65 1 kit 0  . Cholecalciferol (VITAMIN D3) 2000 units TABS Take 2,000 Units by mouth daily.    . citalopram (CELEXA) 40 MG tablet Take 40 mg by mouth daily.    . Continuous Blood Gluc Sensor (FREESTYLE LIBRE 14 DAY SENSOR) MISC Inject 1 each into the skin every 14 (fourteen) days. Use as directed. 2 each 2  . dexlansoprazole (DEXILANT) 60 MG capsule Take 1 capsule (60 mg total) by mouth daily. 90 capsule 3  . donepezil (ARICEPT) 5 MG tablet Take 5 mg by mouth at bedtime.    . eszopiclone (LUNESTA) 2 MG TABS tablet Take 2 mg by mouth at bedtime as needed for sleep. Take immediately before bedtime    . fenofibrate (TRICOR) 145 MG tablet TAKE 1 TABLET ONCE DAILY. (Patient taking differently: Take 145 mg by mouth daily. ) 90 tablet 0  . gabapentin (NEURONTIN) 800 MG tablet Take 800 mg by mouth 3 (three) times daily.     Marland Kitchen glipiZIDE (GLUCOTROL XL) 5 MG 24 hr tablet Take 1 tablet (5 mg total) by mouth daily with breakfast. (Patient not taking: Reported on 12/18/2019) 30 tablet 3  . insulin detemir (LEVEMIR FLEXTOUCH) 100 UNIT/ML FlexPen Inject 60 Units into the skin 2 (two) times daily. (Patient taking differently: Inject 60 Units into the skin at bedtime. ) 60 mL 2  . insulin lispro (HUMALOG) 100 UNIT/ML KwikPen Inject 0.2-0.26 mLs (20-26 Units total) into the skin 3 (three) times daily before meals. 30 mL 0  . Insulin Pen Needle (PEN NEEDLES) 31G X 6 MM MISC 1 each by Does not apply route 4 (four) times daily. 400 each 1  . lisinopril-hydrochlorothiazide (PRINZIDE,ZESTORETIC) 20-12.5 MG tablet Take 2 tablets by mouth daily.     . ondansetron (ZOFRAN) 8 MG tablet Take 1 tablet (8 mg total) by mouth every 8 (eight) hours as needed for nausea or vomiting. (Patient not taking: Reported on 12/18/2019) 30 tablet 3  . ONETOUCH VERIO test strip USE 1 STRIP TO CHECK GLUCOSE 4 TIMES  DAILY. 150 strip 0  . pravastatin (PRAVACHOL) 40 MG tablet Take 40 mg by mouth daily.    . Prucalopride Succinate (MOTEGRITY) 2 MG TABS Take 1 tablet (2 mg total) by mouth daily. 30 tablet 3  . pyridOXINE (VITAMIN B-6) 100 MG tablet Take 100 mg by mouth daily.    . sucralfate (CARAFATE) 1 g tablet Take 1 tablet (1 g total) by mouth 4 (four) times daily as needed (crush and mix with water as needed for upset stomach). (Patient not taking: Reported on 12/18/2019) 90 tablet 3  . vitamin B-12 (CYANOCOBALAMIN) 500 MCG tablet Take 500 mcg by mouth daily.     No current facility-administered medications on file prior to visit.  Allergies  Allergen Reactions  . Sulfa Antibiotics Other (See Comments)    Weakness and fatigued     Social History   Substance and Sexual Activity  Alcohol Use No    Social History   Tobacco Use  Smoking Status Never Smoker  Smokeless Tobacco Never Used    Review of Systems  Constitutional: Positive for malaise/fatigue.  HENT: Positive for sinus pain.   Eyes: Positive for blurred vision and pain.  Respiratory: Positive for shortness of breath.   Cardiovascular: Negative.   Gastrointestinal: Positive for abdominal pain, heartburn, nausea and vomiting.  Genitourinary: Positive for dysuria, frequency and urgency.  Musculoskeletal: Positive for joint pain.  Skin: Negative.   Neurological: Positive for headaches.  Endo/Heme/Allergies: Negative.   Psychiatric/Behavioral: Negative.     Objective   Vitals:   12/17/19 1352  BP: (!) 170/74  Pulse: (!) 102  Resp: 18  Temp: (!) 96.6 F (35.9 C)  SpO2: 92%    Physical Exam Vitals reviewed.  Constitutional:      Appearance: Normal appearance. She is not ill-appearing.  HENT:     Head: Normocephalic and atraumatic.  Eyes:     General: No scleral icterus. Cardiovascular:     Rate and Rhythm: Normal rate and regular rhythm.     Heart sounds: Normal heart sounds. No murmur. No friction rub. No  gallop.   Pulmonary:     Effort: Pulmonary effort is normal. No respiratory distress.     Breath sounds: Normal breath sounds. No stridor. No wheezing, rhonchi or rales.  Abdominal:     General: Bowel sounds are normal. There is no distension.     Palpations: Abdomen is soft. There is no mass.     Tenderness: There is abdominal tenderness. There is no guarding or rebound.     Hernia: No hernia is present.     Comments: Some discomfort to deep palpation in the right upper quadrant and epigastric regions.  Skin:    General: Skin is warm and dry.  Neurological:     Mental Status: She is alert and oriented to person, place, and time.   GI notes reviewed, x-ray reports reviewed  Assessment   Biliary colic, possible early onset gastroparesis, multiple comorbidities  Plan   Patient understands that the cholecystectomy may not fully resolve all her symptoms.  She does have evidence of biliary colic and thus we will proceed with a laparoscopic cholecystectomy.  The risks and benefits of the procedure including bleeding, infection, hepatobiliary injury, the possibility of an open procedure were fully explained to the patient, who gave informed consent.

## 2019-12-19 ENCOUNTER — Ambulatory Visit: Payer: Medicare HMO | Admitting: Nurse Practitioner

## 2019-12-19 NOTE — Patient Instructions (Signed)
Carrie Turner  12/19/2019     @PREFPERIOPPHARMACY @   Your procedure is scheduled on  12/25/2019   Report to [facility] at  0900  A.M.  Call this number if you have problems the morning of surgery:  [phone]   Remember:  Do not eat or drink after midnight.                    Take these medicines the morning of surgery with A SIP OF WATER  Amlodipine, dexilant, gabapentin. Use your inhaler before you come. Only take 30 units of levemir the night before your surgery. DO NOT take any medications for diabetes the morning of your surgery.    Do not wear jewelry, make-up or nail polish.  Do not wear lotions, powders, or perfumes. Please wear deodorant and brush your teeth.  Do not shave 48 hours prior to surgery.  Men may shave face and neck.  Do not bring valuables to the hospital.  [facility] is not responsible for any belongings or valuables.  Contacts, dentures or bridgework may not be worn into surgery.  Leave your suitcase in the car.  After surgery it may be brought to your room.  For patients admitted to the hospital, discharge time will be determined by your treatment team.  Patients discharged the day of surgery will not be allowed to drive home.   Name and phone number of your driver:   family Special instructions:  DO NOT smoke the morning of your surgery.  Please read over the following fact sheets that you were given. Anesthesia Post-op Instructions and Care and Recovery After Surgery       Laparoscopic Cholecystectomy, Care After This sheet gives you information about how to care for yourself after your procedure. Your health care provider may also give you more specific instructions. If you have problems or questions, contact your health care provider. What can I expect after the procedure? After the procedure, it is common to have:  Pain at your incision sites. You will be given medicines to control this pain.  Mild nausea or  vomiting.  Bloating and possible shoulder pain from the air-like gas that was used during the procedure. Follow these instructions at home: Incision care   Follow instructions from your health care provider about how to take care of your incisions. Make sure you: ? Wash your hands with soap and water before you change your bandage (dressing). If soap and water are not available, use hand sanitizer. ? Change your dressing as told by your health care provider. ? Leave stitches (sutures), skin glue, or adhesive strips in place. These skin closures may need to be in place for 2 weeks or longer. If adhesive strip edges start to loosen and curl up, you may trim the loose edges. Do not remove adhesive strips completely unless your health care provider tells you to do that.  Do not take baths, swim, or use a hot tub until your health care provider approves. Ask your health care provider if you can take showers. You may only be allowed to take sponge baths for bathing.  Check your incision area every day for signs of infection. Check for: ? More redness, swelling, or pain. ? More fluid or blood. ? Warmth. ? Pus or a bad smell. Activity  Do not drive or use heavy machinery while taking prescription pain medicine.  Do not lift anything that is heavier than  10 lb (4.5 kg) until your health care provider approves.  Do not play contact sports until your health care provider approves.  Do not drive for 24 hours if you were given a medicine to help you relax (sedative).  Rest as needed. Do not return to work or school until your health care provider approves. General instructions  Take over-the-counter and prescription medicines only as told by your health care provider.  To prevent or treat constipation while you are taking prescription pain medicine, your health care provider may recommend that you: ? Drink enough fluid to keep your urine clear or pale yellow. ? Take over-the-counter or  prescription medicines. ? Eat foods that are high in fiber, such as fresh fruits and vegetables, whole grains, and beans. ? Limit foods that are high in fat and processed sugars, such as fried and sweet foods. Contact a health care provider if:  You develop a rash.  You have more redness, swelling, or pain around your incisions.  You have more fluid or blood coming from your incisions.  Your incisions feel warm to the touch.  You have pus or a bad smell coming from your incisions.  You have a fever.  One or more of your incisions breaks open. Get help right away if:  You have trouble breathing.  You have chest pain.  You have increasing pain in your shoulders.  You faint or feel dizzy when you stand.  You have severe pain in your abdomen.  You have nausea or vomiting that lasts for more than one day.  You have leg pain. This information is not intended to replace advice given to you by your health care provider. Make sure you discuss any questions you have with your health care provider. Document Revised: 06/23/2017 Document Reviewed: 12/28/2015 Elsevier Patient Education  2020 [place] Anesthesia, Adult, Care After This sheet gives you information about how to care for yourself after your procedure. Your health care provider may also give you more specific instructions. If you have problems or questions, contact your health care provider. What can I expect after the procedure? After the procedure, the following side effects are common:  Pain or discomfort at the IV site.  Nausea.  Vomiting.  Sore throat.  Trouble concentrating.  Feeling cold or chills.  Weak or tired.  Sleepiness and fatigue.  Soreness and body aches. These side effects can affect parts of the body that were not involved in surgery. Follow these instructions at home:  For at least 24 hours after the procedure:  Have a responsible adult stay with you. It is important to  have someone help care for you until you are awake and alert.  Rest as needed.  Do not: ? Participate in activities in which you could fall or become injured. ? Drive. ? Use heavy machinery. ? Drink alcohol. ? Take sleeping pills or medicines that cause drowsiness. ? Make important decisions or sign legal documents. ? Take care of children on your own. Eating and drinking  Follow any instructions from your health care provider about eating or drinking restrictions.  When you feel hungry, start by eating small amounts of foods that are soft and easy to digest (bland), such as toast. Gradually return to your regular diet.  Drink enough fluid to keep your urine pale yellow.  If you vomit, rehydrate by drinking water, juice, or clear broth. General instructions  If you have sleep apnea, surgery and certain medicines can increase your risk  for breathing problems. Follow instructions from your health care provider about wearing your sleep device: ? Anytime you are sleeping, including during daytime naps. ? While taking prescription pain medicines, sleeping medicines, or medicines that make you drowsy.  Return to your normal activities as told by your health care provider. Ask your health care provider what activities are safe for you.  Take over-the-counter and prescription medicines only as told by your health care provider.  If you smoke, do not smoke without supervision.  Keep all follow-up visits as told by your health care provider. This is important. Contact a health care provider if:  You have nausea or vomiting that does not get better with medicine.  You cannot eat or drink without vomiting.  You have pain that does not get better with medicine.  You are unable to pass urine.  You develop a skin rash.  You have a fever.  You have redness around your IV site that gets worse. Get help right away if:  You have difficulty breathing.  You have chest pain.  You  have blood in your urine or stool, or you vomit blood. Summary  After the procedure, it is common to have a sore throat or nausea. It is also common to feel tired.  Have a responsible adult stay with you for the first 24 hours after general anesthesia. It is important to have someone help care for you until you are awake and alert.  When you feel hungry, start by eating small amounts of foods that are soft and easy to digest (bland), such as toast. Gradually return to your regular diet.  Drink enough fluid to keep your urine pale yellow.  Return to your normal activities as told by your health care provider. Ask your health care provider what activities are safe for you. This information is not intended to replace advice given to you by your health care provider. Make sure you discuss any questions you have with your health care provider. Document Revised: 07/14/2017 Document Reviewed: 02/24/2017 Elsevier Patient Education  [place]. How to Use Chlorhexidine for Bathing Chlorhexidine gluconate (CHG) is a germ-killing (antiseptic) solution that is used to clean the skin. It can get rid of the bacteria that normally live on the skin and can keep them away for about 24 hours. To clean your skin with CHG, you may be given:  A CHG solution to use in the shower or as part of a sponge bath.  A prepackaged cloth that contains CHG. Cleaning your skin with CHG may help lower the risk for infection:  While you are staying in the intensive care unit of the hospital.  If you have a vascular access, such as a central line, to provide short-term or long-term access to your veins.  If you have a catheter to drain urine from your bladder.  If you are on a ventilator. A ventilator is a machine that helps you breathe by moving air in and out of your lungs.  After surgery. What are the risks? Risks of using CHG include:  A skin reaction.  Hearing loss, if CHG gets in your ears.  Eye  injury, if CHG gets in your eyes and is not rinsed out.  The CHG product catching fire. Make sure that you avoid smoking and flames after applying CHG to your skin. Do not use CHG:  If you have a chlorhexidine allergy or have previously reacted to chlorhexidine.  On babies younger than 30 months of age.  How to use CHG solution  Use CHG only as told by your health care provider, and follow the instructions on the label.  Use the full amount of CHG as directed. Usually, this is one bottle. During a shower Follow these steps when using CHG solution during a shower (unless your health care provider gives you different instructions): 1. Start the shower. 2. Use your normal soap and shampoo to wash your face and hair. 3. Turn off the shower or move out of the shower stream. 4. Pour the CHG onto a clean washcloth. Do not use any type of brush or rough-edged sponge. 5. Starting at your neck, lather your body down to your toes. Make sure you follow these instructions: ? If you will be having surgery, pay special attention to the part of your body where you will be having surgery. Scrub this area for at least 1 minute. ? Do not use CHG on your head or face. If the solution gets into your ears or eyes, rinse them well with water. ? Avoid your genital area. ? Avoid any areas of skin that have broken skin, cuts, or scrapes. ? Scrub your back and under your arms. Make sure to wash skin folds. 6. Let the lather sit on your skin for 1-2 minutes or as long as told by your health care provider. 7. Thoroughly rinse your entire body in the shower. Make sure that all body creases and crevices are rinsed well. 8. Dry off with a clean towel. Do not put any substances on your body afterward--such as powder, lotion, or perfume--unless you are told to do so by your health care provider. Only use lotions that are recommended by the manufacturer. 9. Put on clean clothes or pajamas. 10. If it is the night before  your surgery, sleep in clean sheets.  During a sponge bath Follow these steps when using CHG solution during a sponge bath (unless your health care provider gives you different instructions): 1. Use your normal soap and shampoo to wash your face and hair. 2. Pour the CHG onto a clean washcloth. 3. Starting at your neck, lather your body down to your toes. Make sure you follow these instructions: ? If you will be having surgery, pay special attention to the part of your body where you will be having surgery. Scrub this area for at least 1 minute. ? Do not use CHG on your head or face. If the solution gets into your ears or eyes, rinse them well with water. ? Avoid your genital area. ? Avoid any areas of skin that have broken skin, cuts, or scrapes. ? Scrub your back and under your arms. Make sure to wash skin folds. 4. Let the lather sit on your skin for 1-2 minutes or as long as told by your health care provider. 5. Using a different clean, wet washcloth, thoroughly rinse your entire body. Make sure that all body creases and crevices are rinsed well. 6. Dry off with a clean towel. Do not put any substances on your body afterward--such as powder, lotion, or perfume--unless you are told to do so by your health care provider. Only use lotions that are recommended by the manufacturer. 7. Put on clean clothes or pajamas. 8. If it is the night before your surgery, sleep in clean sheets. How to use CHG prepackaged cloths  Only use CHG cloths as told by your health care provider, and follow the instructions on the label.  Use the CHG cloth on clean,  dry skin.  Do not use the CHG cloth on your head or face unless your health care provider tells you to.  When washing with the CHG cloth: ? Avoid your genital area. ? Avoid any areas of skin that have broken skin, cuts, or scrapes. Before surgery Follow these steps when using a CHG cloth to clean before surgery (unless your health care provider gives  you different instructions): 1. Using the CHG cloth, vigorously scrub the part of your body where you will be having surgery. Scrub using a back-and-forth motion for 3 minutes. The area on your body should be completely wet with CHG when you are done scrubbing. 2. Do not rinse. Discard the cloth and let the area air-dry. Do not put any substances on the area afterward, such as powder, lotion, or perfume. 3. Put on clean clothes or pajamas. 4. If it is the night before your surgery, sleep in clean sheets.  For general bathing Follow these steps when using CHG cloths for general bathing (unless your health care provider gives you different instructions). 1. Use a separate CHG cloth for each area of your body. Make sure you wash between any folds of skin and between your fingers and toes. Wash your body in the following order, switching to a new cloth after each step: ? The front of your neck, shoulders, and chest. ? Both of your arms, under your arms, and your hands. ? Your stomach and groin area, avoiding the genitals. ? Your right leg and foot. ? Your left leg and foot. ? The back of your neck, your back, and your buttocks. 2. Do not rinse. Discard the cloth and let the area air-dry. Do not put any substances on your body afterward--such as powder, lotion, or perfume--unless you are told to do so by your health care provider. Only use lotions that are recommended by the manufacturer. 3. Put on clean clothes or pajamas. Contact a health care provider if:  Your skin gets irritated after scrubbing.  You have questions about using your solution or cloth. Get help right away if:  Your eyes become very red or swollen.  Your eyes itch badly.  Your skin itches badly and is red or swollen.  Your hearing changes.  You have trouble seeing.  You have swelling or tingling in your mouth or throat.  You have trouble breathing.  You swallow any chlorhexidine. Summary  Chlorhexidine gluconate  (CHG) is a germ-killing (antiseptic) solution that is used to clean the skin. Cleaning your skin with CHG may help to lower your risk for infection.  You may be given CHG to use for bathing. It may be in a bottle or in a prepackaged cloth to use on your skin. Carefully follow your health care provider's instructions and the instructions on the product label.  Do not use CHG if you have a chlorhexidine allergy.  Contact your health care provider if your skin gets irritated after scrubbing. This information is not intended to replace advice given to you by your health care provider. Make sure you discuss any questions you have with your health care provider. Document Revised: 09/27/2018 Document Reviewed: 06/08/2017 Elsevier Patient Education  [place].

## 2019-12-20 ENCOUNTER — Encounter (HOSPITAL_COMMUNITY)
Admission: RE | Admit: 2019-12-20 | Discharge: 2019-12-20 | Disposition: A | Discharge status: Home / Self Care | Payer: Medicare HMO | Source: Ambulatory Visit | Attending: General Surgery | Admitting: General Surgery

## 2019-12-20 ENCOUNTER — Other Ambulatory Visit: Payer: Self-pay

## 2019-12-20 ENCOUNTER — Encounter (HOSPITAL_COMMUNITY): Payer: Self-pay

## 2019-12-20 DIAGNOSIS — Z79899 Other long term (current) drug therapy: Secondary | ICD-10-CM | POA: Diagnosis not present

## 2019-12-20 DIAGNOSIS — I10 Essential (primary) hypertension: Secondary | ICD-10-CM | POA: Insufficient documentation

## 2019-12-20 DIAGNOSIS — K8050 Calculus of bile duct without cholangitis or cholecystitis without obstruction: Secondary | ICD-10-CM | POA: Diagnosis not present

## 2019-12-20 DIAGNOSIS — K219 Gastro-esophageal reflux disease without esophagitis: Secondary | ICD-10-CM | POA: Insufficient documentation

## 2019-12-20 DIAGNOSIS — Z01818 Encounter for other preprocedural examination: Secondary | ICD-10-CM | POA: Insufficient documentation

## 2019-12-20 DIAGNOSIS — Z8249 Family history of ischemic heart disease and other diseases of the circulatory system: Secondary | ICD-10-CM | POA: Insufficient documentation

## 2019-12-20 DIAGNOSIS — J449 Chronic obstructive pulmonary disease, unspecified: Secondary | ICD-10-CM | POA: Insufficient documentation

## 2019-12-20 DIAGNOSIS — E785 Hyperlipidemia, unspecified: Secondary | ICD-10-CM | POA: Insufficient documentation

## 2019-12-20 DIAGNOSIS — Z794 Long term (current) use of insulin: Secondary | ICD-10-CM | POA: Insufficient documentation

## 2019-12-20 DIAGNOSIS — I4510 Unspecified right bundle-branch block: Secondary | ICD-10-CM | POA: Diagnosis not present

## 2019-12-20 DIAGNOSIS — E1140 Type 2 diabetes mellitus with diabetic neuropathy, unspecified: Secondary | ICD-10-CM | POA: Diagnosis not present

## 2019-12-20 DIAGNOSIS — Z7982 Long term (current) use of aspirin: Secondary | ICD-10-CM | POA: Insufficient documentation

## 2019-12-20 HISTORY — DX: Unspecified osteoarthritis, unspecified site: M19.90

## 2019-12-20 LAB — CBC
HCT: 40.6 % (ref 36.0–46.0)
Hemoglobin: 12.8 g/dL (ref 12.0–15.0)
MCH: 30.8 pg (ref 26.0–34.0)
MCHC: 31.5 g/dL (ref 30.0–36.0)
MCV: 97.6 fL (ref 80.0–100.0)
Platelets: 490 10*3/uL — ABNORMAL HIGH (ref 150–400)
RBC: 4.16 MIL/uL (ref 3.87–5.11)
RDW: 11.7 % (ref 11.5–15.5)
WBC: 12.3 10*3/uL — ABNORMAL HIGH (ref 4.0–10.5)
nRBC: 0 % (ref 0.0–0.2)

## 2019-12-20 LAB — BASIC METABOLIC PANEL
Anion gap: 13 (ref 5–15)
BUN: 13 mg/dL (ref 8–23)
CO2: 25 mmol/L (ref 22–32)
Calcium: 9.2 mg/dL (ref 8.9–10.3)
Chloride: 98 mmol/L (ref 98–111)
Creatinine, Ser: 0.73 mg/dL (ref 0.44–1.00)
GFR calc Af Amer: >60 mL/min (ref >60)
GFR calc non Af Amer: >60 mL/min (ref >60)
Glucose, Bld: 304 mg/dL — ABNORMAL HIGH (ref 70–99)
Potassium: 3.6 mmol/L (ref 3.5–5.1)
Sodium: 136 mmol/L (ref 135–145)

## 2019-12-20 LAB — HEMOGLOBIN A1C
Hgb A1c MFr Bld: 9.9 % — ABNORMAL HIGH (ref 4.8–5.6)
Mean Plasma Glucose: 237.43 mg/dL

## 2019-12-24 ENCOUNTER — Other Ambulatory Visit (HOSPITAL_COMMUNITY)
Admission: RE | Admit: 2019-12-24 | Discharge: 2019-12-24 | Disposition: A | Discharge status: Home / Self Care | Payer: Medicare HMO | Source: Ambulatory Visit | Attending: General Surgery | Admitting: General Surgery

## 2019-12-24 ENCOUNTER — Other Ambulatory Visit: Payer: Self-pay

## 2019-12-24 DIAGNOSIS — Z20822 Contact with and (suspected) exposure to covid-19: Secondary | ICD-10-CM | POA: Diagnosis not present

## 2019-12-24 DIAGNOSIS — Z01812 Encounter for preprocedural laboratory examination: Secondary | ICD-10-CM | POA: Insufficient documentation

## 2019-12-24 NOTE — Pre-Procedure Instructions (Signed)
HgbA1C routed to PCP. 

## 2019-12-25 ENCOUNTER — Ambulatory Visit (HOSPITAL_COMMUNITY): Payer: Medicare HMO | Admitting: Anesthesiology

## 2019-12-25 ENCOUNTER — Encounter (HOSPITAL_COMMUNITY)
Admission: RE | Disposition: A | Discharge status: Home / Self Care | Payer: Self-pay | Source: Home / Self Care | Attending: General Surgery

## 2019-12-25 ENCOUNTER — Encounter (HOSPITAL_COMMUNITY): Payer: Self-pay | Admitting: General Surgery

## 2019-12-25 ENCOUNTER — Ambulatory Visit (HOSPITAL_COMMUNITY)
Admission: RE | Admit: 2019-12-25 | Discharge: 2019-12-25 | Disposition: A | Discharge status: Home / Self Care | Payer: Medicare HMO | Attending: General Surgery | Admitting: General Surgery

## 2019-12-25 DIAGNOSIS — Z9842 Cataract extraction status, left eye: Secondary | ICD-10-CM | POA: Diagnosis not present

## 2019-12-25 DIAGNOSIS — Z7982 Long term (current) use of aspirin: Secondary | ICD-10-CM | POA: Insufficient documentation

## 2019-12-25 DIAGNOSIS — J449 Chronic obstructive pulmonary disease, unspecified: Secondary | ICD-10-CM | POA: Insufficient documentation

## 2019-12-25 DIAGNOSIS — I10 Essential (primary) hypertension: Secondary | ICD-10-CM | POA: Diagnosis not present

## 2019-12-25 DIAGNOSIS — Z961 Presence of intraocular lens: Secondary | ICD-10-CM | POA: Insufficient documentation

## 2019-12-25 DIAGNOSIS — K449 Diaphragmatic hernia without obstruction or gangrene: Secondary | ICD-10-CM | POA: Insufficient documentation

## 2019-12-25 DIAGNOSIS — E785 Hyperlipidemia, unspecified: Secondary | ICD-10-CM | POA: Insufficient documentation

## 2019-12-25 DIAGNOSIS — Z794 Long term (current) use of insulin: Secondary | ICD-10-CM | POA: Insufficient documentation

## 2019-12-25 DIAGNOSIS — K8050 Calculus of bile duct without cholangitis or cholecystitis without obstruction: Secondary | ICD-10-CM

## 2019-12-25 DIAGNOSIS — Z79899 Other long term (current) drug therapy: Secondary | ICD-10-CM | POA: Insufficient documentation

## 2019-12-25 DIAGNOSIS — Z8249 Family history of ischemic heart disease and other diseases of the circulatory system: Secondary | ICD-10-CM | POA: Insufficient documentation

## 2019-12-25 DIAGNOSIS — K811 Chronic cholecystitis: Secondary | ICD-10-CM | POA: Diagnosis not present

## 2019-12-25 DIAGNOSIS — E1140 Type 2 diabetes mellitus with diabetic neuropathy, unspecified: Secondary | ICD-10-CM | POA: Diagnosis not present

## 2019-12-25 DIAGNOSIS — K2100 Gastro-esophageal reflux disease with esophagitis, without bleeding: Secondary | ICD-10-CM | POA: Diagnosis not present

## 2019-12-25 DIAGNOSIS — Z800 Family history of malignant neoplasm of digestive organs: Secondary | ICD-10-CM | POA: Diagnosis not present

## 2019-12-25 DIAGNOSIS — F329 Major depressive disorder, single episode, unspecified: Secondary | ICD-10-CM | POA: Insufficient documentation

## 2019-12-25 DIAGNOSIS — F0390 Unspecified dementia without behavioral disturbance: Secondary | ICD-10-CM | POA: Diagnosis not present

## 2019-12-25 DIAGNOSIS — Z9841 Cataract extraction status, right eye: Secondary | ICD-10-CM | POA: Insufficient documentation

## 2019-12-25 HISTORY — PX: CHOLECYSTECTOMY: SHX55

## 2019-12-25 LAB — SARS CORONAVIRUS 2 (TAT 6-24 HRS): SARS Coronavirus 2: NEGATIVE

## 2019-12-25 LAB — GLUCOSE, CAPILLARY: Glucose-Capillary: 225 mg/dL — ABNORMAL HIGH (ref 70–99)

## 2019-12-25 SURGERY — LAPAROSCOPIC CHOLECYSTECTOMY
Anesthesia: General | Site: Abdomen

## 2019-12-25 MED ORDER — FENTANYL CITRATE (PF) 100 MCG/2ML IJ SOLN
INTRAMUSCULAR | Status: DC | PRN
  Administered 2019-12-25: 100 ug via INTRAVENOUS

## 2019-12-25 MED ORDER — DEXAMETHASONE SODIUM PHOSPHATE 10 MG/ML IJ SOLN
INTRAMUSCULAR | Status: DC | PRN
  Administered 2019-12-25: 5 mg via INTRAVENOUS

## 2019-12-25 MED ORDER — MIDAZOLAM HCL 2 MG/2ML IJ SOLN
INTRAMUSCULAR | Status: AC
  Filled 2019-12-25: qty 2

## 2019-12-25 MED ORDER — CHLORHEXIDINE GLUCONATE CLOTH 2 % EX PADS: 6.0000 | MEDICATED_PAD | Freq: Once | CUTANEOUS | Status: DC

## 2019-12-25 MED ORDER — HYDROCODONE-ACETAMINOPHEN 5-325 MG PO TABS: 1.0000 | ORAL_TABLET | Freq: Four times a day (QID) | ORAL | 0 refills | Status: DC | PRN

## 2019-12-25 MED ORDER — ESMOLOL HCL 100 MG/10ML IV SOLN
INTRAVENOUS | Status: DC | PRN
  Administered 2019-12-25: 20 mg via INTRAVENOUS

## 2019-12-25 MED ORDER — ESMOLOL HCL 100 MG/10ML IV SOLN
INTRAVENOUS | Status: AC
  Filled 2019-12-25: qty 10

## 2019-12-25 MED ORDER — PROPOFOL 10 MG/ML IV BOLUS
INTRAVENOUS | Status: DC | PRN
  Administered 2019-12-25: 120 mg via INTRAVENOUS

## 2019-12-25 MED ORDER — ONDANSETRON HCL 4 MG/2ML IJ SOLN: 4.0000 mg | Freq: Once | INTRAMUSCULAR | Status: DC | PRN

## 2019-12-25 MED ORDER — EPHEDRINE 5 MG/ML INJ
INTRAVENOUS | Status: AC
  Filled 2019-12-25: qty 10

## 2019-12-25 MED ORDER — ONDANSETRON HCL 4 MG/2ML IJ SOLN
INTRAMUSCULAR | Status: DC | PRN
  Administered 2019-12-25: 4 mg via INTRAVENOUS

## 2019-12-25 MED ORDER — SUCCINYLCHOLINE CHLORIDE 200 MG/10ML IV SOSY
PREFILLED_SYRINGE | INTRAVENOUS | Status: AC
  Filled 2019-12-25: qty 10

## 2019-12-25 MED ORDER — ROCURONIUM BROMIDE 10 MG/ML (PF) SYRINGE
PREFILLED_SYRINGE | INTRAVENOUS | Status: AC
  Filled 2019-12-25: qty 10

## 2019-12-25 MED ORDER — SUCCINYLCHOLINE CHLORIDE 20 MG/ML IJ SOLN
INTRAMUSCULAR | Status: DC | PRN
  Administered 2019-12-25: 160 mg via INTRAVENOUS

## 2019-12-25 MED ORDER — KETOROLAC TROMETHAMINE 15 MG/ML IJ SOLN
15.0000 mg | Freq: Once | INTRAMUSCULAR | Status: AC
  Administered 2019-12-25: 15 mg via INTRAVENOUS

## 2019-12-25 MED ORDER — CHLORHEXIDINE GLUCONATE 0.12 % MT SOLN
15.0000 mL | Freq: Once | OROMUCOSAL | Status: AC
  Administered 2019-12-25: 15 mL via OROMUCOSAL
  Filled 2019-12-25: qty 15

## 2019-12-25 MED ORDER — LACTATED RINGERS IV SOLN
Freq: Once | INTRAVENOUS | Status: AC
  Administered 2019-12-25: 1000 mL via INTRAVENOUS

## 2019-12-25 MED ORDER — 0.9 % SODIUM CHLORIDE (POUR BTL) OPTIME
TOPICAL | Status: DC | PRN
  Administered 2019-12-25: 1000 mL

## 2019-12-25 MED ORDER — BUPIVACAINE LIPOSOME 1.3 % IJ SUSP
INTRAMUSCULAR | Status: DC | PRN
  Administered 2019-12-25: 20 mL

## 2019-12-25 MED ORDER — FENTANYL CITRATE (PF) 100 MCG/2ML IJ SOLN
INTRAMUSCULAR | Status: AC
  Filled 2019-12-25: qty 2

## 2019-12-25 MED ORDER — LIDOCAINE 2% (20 MG/ML) 5 ML SYRINGE
INTRAMUSCULAR | Status: AC
  Filled 2019-12-25: qty 5

## 2019-12-25 MED ORDER — LACTATED RINGERS IV SOLN
INTRAVENOUS | Status: DC | PRN
Start: 2019-12-25 — End: 2019-12-25

## 2019-12-25 MED ORDER — KETOROLAC TROMETHAMINE 15 MG/ML IJ SOLN
INTRAMUSCULAR | Status: AC
  Filled 2019-12-25: qty 1

## 2019-12-25 MED ORDER — MEPERIDINE HCL 50 MG/ML IJ SOLN: 6.2500 mg | INTRAMUSCULAR | Status: DC | PRN

## 2019-12-25 MED ORDER — CIPROFLOXACIN IN D5W 400 MG/200ML IV SOLN
400.0000 mg | INTRAVENOUS | Status: AC
  Administered 2019-12-25: 400 mg via INTRAVENOUS
  Filled 2019-12-25: qty 200

## 2019-12-25 MED ORDER — HEMOSTATIC AGENTS (NO CHARGE) OPTIME
TOPICAL | Status: DC | PRN
  Administered 2019-12-25: 1 via TOPICAL

## 2019-12-25 MED ORDER — ONDANSETRON HCL 4 MG/2ML IJ SOLN
INTRAMUSCULAR | Status: AC
  Filled 2019-12-25: qty 2

## 2019-12-25 MED ORDER — ROCURONIUM BROMIDE 100 MG/10ML IV SOLN
INTRAVENOUS | Status: DC | PRN
  Administered 2019-12-25: 40 mg via INTRAVENOUS

## 2019-12-25 MED ORDER — SUGAMMADEX SODIUM 500 MG/5ML IV SOLN
INTRAVENOUS | Status: DC | PRN
  Administered 2019-12-25: 200 mg via INTRAVENOUS

## 2019-12-25 MED ORDER — LIDOCAINE HCL (CARDIAC) PF 50 MG/5ML IV SOSY
PREFILLED_SYRINGE | INTRAVENOUS | Status: DC | PRN
  Administered 2019-12-25: 100 mg via INTRAVENOUS

## 2019-12-25 MED ORDER — BUPIVACAINE LIPOSOME 1.3 % IJ SUSP
INTRAMUSCULAR | Status: AC
  Filled 2019-12-25: qty 20

## 2019-12-25 MED ORDER — PROPOFOL 10 MG/ML IV BOLUS
INTRAVENOUS | Status: AC
  Filled 2019-12-25: qty 20

## 2019-12-25 MED ORDER — MIDAZOLAM HCL 2 MG/2ML IJ SOLN
INTRAMUSCULAR | Status: DC | PRN
  Administered 2019-12-25: 2 mg via INTRAVENOUS

## 2019-12-25 MED ORDER — ORAL CARE MOUTH RINSE: 15.0000 mL | Freq: Once | OROMUCOSAL | Status: AC

## 2019-12-25 MED ORDER — EPHEDRINE SULFATE 50 MG/ML IJ SOLN
INTRAMUSCULAR | Status: DC | PRN
  Administered 2019-12-25 (×2): 10 mg via INTRAVENOUS

## 2019-12-25 MED ORDER — HYDROMORPHONE HCL 1 MG/ML IJ SOLN: 0.2500 mg | INTRAMUSCULAR | Status: DC | PRN

## 2019-12-25 SURGICAL SUPPLY — 52 items
ADH SKN CLS APL DERMABOND .7 (GAUZE/BANDAGES/DRESSINGS) ×1
APL PRP STRL LF DISP 70% ISPRP (MISCELLANEOUS) ×1
APL SRG 38 LTWT LNG FL B (MISCELLANEOUS)
APPLICATOR ARISTA FLEXITIP XL (MISCELLANEOUS) IMPLANT
APPLIER CLIP ROT 10 11.4 M/L (STAPLE) ×3
APR CLP MED LRG 11.4X10 (STAPLE) ×1
BAG RETRIEVAL 10 (BASKET) ×1
BAG RETRIEVAL 10MM (BASKET) ×1
CHLORAPREP W/TINT 26 (MISCELLANEOUS) ×3 IMPLANT
CLIP APPLIE ROT 10 11.4 M/L (STAPLE) ×1 IMPLANT
CLOTH BEACON ORANGE TIMEOUT ST (SAFETY) ×3 IMPLANT
COVER LIGHT HANDLE STERIS (MISCELLANEOUS) ×6 IMPLANT
COVER WAND RF STERILE (DRAPES) ×3 IMPLANT
DERMABOND ADVANCED (GAUZE/BANDAGES/DRESSINGS) ×2
DERMABOND ADVANCED .7 DNX12 (GAUZE/BANDAGES/DRESSINGS) ×1 IMPLANT
ELECT REM PT RETURN 9FT ADLT (ELECTROSURGICAL) ×3
ELECTRODE REM PT RTRN 9FT ADLT (ELECTROSURGICAL) ×1 IMPLANT
GLOVE BIO SURGEON STRL SZ8.5 (GLOVE) ×2 IMPLANT
GLOVE BIOGEL PI IND STRL 6.5 (GLOVE) IMPLANT
GLOVE BIOGEL PI IND STRL 7.0 (GLOVE) ×2 IMPLANT
GLOVE BIOGEL PI INDICATOR 6.5 (GLOVE) ×2
GLOVE BIOGEL PI INDICATOR 7.0 (GLOVE) ×4
GLOVE ECLIPSE 6.5 STRL STRAW (GLOVE) ×2 IMPLANT
GLOVE SURG SS PI 7.5 STRL IVOR (GLOVE) ×3 IMPLANT
GOWN STRL REUS W/TWL LRG LVL3 (GOWN DISPOSABLE) ×9 IMPLANT
HEMOSTAT ARISTA ABSORB 3G PWDR (HEMOSTASIS) IMPLANT
HEMOSTAT SNOW SURGICEL 2X4 (HEMOSTASIS) ×3 IMPLANT
INST SET LAPROSCOPIC AP (KITS) ×3 IMPLANT
KIT TURNOVER KIT A (KITS) ×3 IMPLANT
MANIFOLD NEPTUNE II (INSTRUMENTS) ×3 IMPLANT
NDL HYPO 18GX1.5 BLUNT FILL (NEEDLE) ×1 IMPLANT
NDL INSUFFLATION 14GA 120MM (NEEDLE) ×1 IMPLANT
NEEDLE HYPO 18GX1.5 BLUNT FILL (NEEDLE) ×3 IMPLANT
NEEDLE HYPO 22GX1.5 SAFETY (NEEDLE) ×3 IMPLANT
NEEDLE INSUFFLATION 14GA 120MM (NEEDLE) ×3 IMPLANT
NS IRRIG 1000ML POUR BTL (IV SOLUTION) ×3 IMPLANT
PACK LAP CHOLE LZT030E (CUSTOM PROCEDURE TRAY) ×3 IMPLANT
PAD ARMBOARD 7.5X6 YLW CONV (MISCELLANEOUS) ×3 IMPLANT
SET BASIN LINEN APH (SET/KITS/TRAYS/PACK) ×3 IMPLANT
SET TUBE SMOKE EVAC HIGH FLOW (TUBING) ×3 IMPLANT
SLEEVE ENDOPATH XCEL 5M (ENDOMECHANICALS) ×3 IMPLANT
SUT MNCRL AB 4-0 PS2 18 (SUTURE) ×6 IMPLANT
SUT VICRYL 0 UR6 27IN ABS (SUTURE) ×3 IMPLANT
SYR 20ML LL LF (SYRINGE) ×6 IMPLANT
SYS BAG RETRIEVAL 10MM (BASKET) ×1
SYSTEM BAG RETRIEVAL 10MM (BASKET) ×1 IMPLANT
TROCAR ENDO BLADELESS 11MM (ENDOMECHANICALS) ×3 IMPLANT
TROCAR XCEL NON-BLD 5MMX100MML (ENDOMECHANICALS) ×3 IMPLANT
TROCAR XCEL UNIV SLVE 11M 100M (ENDOMECHANICALS) ×3 IMPLANT
TUBE CONNECTING 12'X1/4 (SUCTIONS) ×1
TUBE CONNECTING 12X1/4 (SUCTIONS) ×2 IMPLANT
WARMER LAPAROSCOPE (MISCELLANEOUS) ×3 IMPLANT

## 2019-12-25 NOTE — Transfer of Care (Signed)
Immediate Anesthesia Transfer of Care Note  Patient: Carrie Turner  Procedure(s) Performed: LAPAROSCOPIC CHOLECYSTECTOMY (N/A Abdomen)  Patient Location: PACU  Anesthesia Type:General  Level of Consciousness: awake, alert , oriented and patient cooperative  Airway & Oxygen Therapy: Patient Spontanous Breathing and Patient connected to face mask oxygen  Post-op Assessment: Report given to RN, Post -op Vital signs reviewed and stable and Patient moving all extremities  Post vital signs: Reviewed and stable  Last Vitals:  Vitals Value Taken Time  BP    Temp    Pulse 86 12/25/19 1051  Resp 16 12/25/19 1051  SpO2 96 % 12/25/19 1051  Vitals shown include unvalidated device data.  Last Pain:  Vitals:   12/25/19 0916  TempSrc: Oral  PainSc: 0-No pain      Patients Stated Pain Goal: 8 ([phone] [id number])  Complications: No apparent anesthesia complications

## 2019-12-25 NOTE — Interval H&P Note (Signed)
History and Physical Interval Note:  12/25/2019 9:43 AM  Carrie Turner  has presented today for surgery, with the diagnosis of Biliary Colic.  The various methods of treatment have been discussed with the patient and family. After consideration of risks, benefits and other options for treatment, the patient has consented to  Procedure(s): LAPAROSCOPIC CHOLECYSTECTOMY (N/A) as a surgical intervention.  The patient's history has been reviewed, patient examined, no change in status, stable for surgery.  I have reviewed the patient's chart and labs.  Questions were answered to the patient's satisfaction.     Aviva Signs

## 2019-12-25 NOTE — Anesthesia Postprocedure Evaluation (Signed)
Anesthesia Post Note  Patient: Carrie Turner  Procedure(s) Performed: LAPAROSCOPIC CHOLECYSTECTOMY (N/A Abdomen)  Patient location during evaluation: PACU Anesthesia Type: General Level of consciousness: awake, oriented, awake and alert and patient cooperative Pain management: pain level controlled Vital Signs Assessment: post-procedure vital signs reviewed and stable Respiratory status: spontaneous breathing, respiratory function stable and nonlabored ventilation Cardiovascular status: blood pressure returned to baseline and stable Postop Assessment: no headache and no backache Anesthetic complications: no     Last Vitals:  Vitals:   12/25/19 0916  BP: 140/77  Pulse: 80  Resp: 16  Temp: 36.6 C  SpO2: 96%    Last Pain:  Vitals:   12/25/19 0916  TempSrc: Oral  PainSc: 0-No pain                 Tacy Learn

## 2019-12-25 NOTE — Discharge Instructions (Signed)
PLEASE WEAR Carrie Turner EXPAREL BRACELET UNTIL Sunday June 6TH, 2021.  DO NOT USE ANY ADDITIONAL NUMBING MEDICATIONS WITHOUT CONSULTING A PHYSICIAN       Laparoscopic Cholecystectomy, Care After This sheet gives you information about how to care for yourself after your procedure. Your health care provider may also give you more specific instructions. If you have problems or questions, contact your health care provider. What can I expect after the procedure? After the procedure, it is common to have:  Pain at your incision sites. You will be given medicines to control this pain.  Mild nausea or vomiting.  Bloating and possible shoulder pain from the air-like gas that was used during the procedure. Follow these instructions at home: Incision care   Follow instructions from your health care provider about how to take care of your incisions. Make sure you: ? Wash your hands with soap and water before you change your bandage (dressing). If soap and water are not available, use hand sanitizer. ? Change your dressing as told by your health care provider. ? Leave stitches (sutures), skin glue, or adhesive strips in place. These skin closures may need to be in place for 2 weeks or longer. If adhesive strip edges start to loosen and curl up, you may trim the loose edges. Do not remove adhesive strips completely unless your health care provider tells you to do that.  Do not take baths, swim, or use a hot tub until your health care provider approves. Ask your health care provider if you can take showers. You may only be allowed to take sponge baths for bathing.  Check your incision area every day for signs of infection. Check for: ? More redness, swelling, or pain. ? More fluid or blood. ? Warmth. ? Pus or a bad smell. Activity  Do not drive or use heavy machinery while taking prescription pain medicine.  Do not lift anything that is heavier than 10 lb (4.5 kg) until your health care provider  approves.  Do not play contact sports until your health care provider approves.  Do not drive for 24 hours if you were given a medicine to help you relax (sedative).  Rest as needed. Do not return to work or school until your health care provider approves. General instructions  Take over-the-counter and prescription medicines only as told by your health care provider.  To prevent or treat constipation while you are taking prescription pain medicine, your health care provider may recommend that you: ? Drink enough fluid to keep your urine clear or pale yellow. ? Take over-the-counter or prescription medicines. ? Eat foods that are high in fiber, such as fresh fruits and vegetables, whole grains, and beans. ? Limit foods that are high in fat and processed sugars, such as fried and sweet foods. Contact a health care provider if:  You develop a rash.  You have more redness, swelling, or pain around your incisions.  You have more fluid or blood coming from your incisions.  Your incisions feel warm to the touch.  You have pus or a bad smell coming from your incisions.  You have a fever.  One or more of your incisions breaks open. Get help right away if:  You have trouble breathing.  You have chest pain.  You have increasing pain in your shoulders.  You faint or feel dizzy when you stand.  You have severe pain in your abdomen.  You have nausea or vomiting that lasts for more than one day.  You have leg pain. This information is not intended to replace advice given to you by your health care provider. Make sure you discuss any questions you have with your health care provider. Document Revised: 06/23/2017 Document Reviewed: 12/28/2015 Elsevier Patient Education  2020 [place] Anesthesia, Adult, Care After This sheet gives you information about how to care for yourself after your procedure. Your health care provider may also give you more specific  instructions. If you have problems or questions, contact your health care provider. What can I expect after the procedure? After the procedure, the following side effects are common:  Pain or discomfort at the IV site.  Nausea.  Vomiting.  Sore throat.  Trouble concentrating.  Feeling cold or chills.  Weak or tired.  Sleepiness and fatigue.  Soreness and body aches. These side effects can affect parts of the body that were not involved in surgery. Follow these instructions at home:  For at least 24 hours after the procedure:  Have a responsible adult stay with you. It is important to have someone help care for you until you are awake and alert.  Rest as needed.  Do not: ? Participate in activities in which you could fall or become injured. ? Drive. ? Use heavy machinery. ? Drink alcohol. ? Take sleeping pills or medicines that cause drowsiness. ? Make important decisions or sign legal documents. ? Take care of children on your own. Eating and drinking  Follow any instructions from your health care provider about eating or drinking restrictions.  When you feel hungry, start by eating small amounts of foods that are soft and easy to digest (bland), such as toast. Gradually return to your regular diet.  Drink enough fluid to keep your urine pale yellow.  If you vomit, rehydrate by drinking water, juice, or clear broth. General instructions  If you have sleep apnea, surgery and certain medicines can increase your risk for breathing problems. Follow instructions from your health care provider about wearing your sleep device: ? Anytime you are sleeping, including during daytime naps. ? While taking prescription pain medicines, sleeping medicines, or medicines that make you drowsy.  Return to your normal activities as told by your health care provider. Ask your health care provider what activities are safe for you.  Take over-the-counter and prescription medicines only  as told by your health care provider.  If you smoke, do not smoke without supervision.  Keep all follow-up visits as told by your health care provider. This is important. Contact a health care provider if:  You have nausea or vomiting that does not get better with medicine.  You cannot eat or drink without vomiting.  You have pain that does not get better with medicine.  You are unable to pass urine.  You develop a skin rash.  You have a fever.  You have redness around your IV site that gets worse. Get help right away if:  You have difficulty breathing.  You have chest pain.  You have blood in your urine or stool, or you vomit blood. Summary  After the procedure, it is common to have a sore throat or nausea. It is also common to feel tired.  Have a responsible adult stay with you for the first 24 hours after general anesthesia. It is important to have someone help care for you until you are awake and alert.  When you feel hungry, start by eating small amounts of foods that are soft and  easy to digest (bland), such as toast. Gradually return to your regular diet.  Drink enough fluid to keep your urine pale yellow.  Return to your normal activities as told by your health care provider. Ask your health care provider what activities are safe for you. This information is not intended to replace advice given to you by your health care provider. Make sure you discuss any questions you have with your health care provider. Document Revised: 07/14/2017 Document Reviewed: 02/24/2017 Elsevier Patient Education  [place].

## 2019-12-25 NOTE — Op Note (Signed)
Patient:  Carrie Turner  DOB:  10/22/1950  MRN:  [id number]   Preop Diagnosis: Biliary colic  Postop Diagnosis: Same  Procedure: Laparoscopic cholecystectomy  Surgeon: Aviva Signs, MD  Anes: General endotracheal  Indications: Patient is a 69 year old white female who presents with biliary colic.  She has had extensive work-up which has been unremarkable except for her right upper quadrant abdominal pain.  She now presents for laparoscopic cholecystectomy.  The risks and benefits of the procedure including bleeding, infection, hepatobiliary injury, and the possibility of an open procedure were fully explained to the patient, who gave informed consent.  Procedure note: The patient was placed in supine position.  After induction of general endotracheal anesthesia, the abdomen was prepped and draped using the usual sterile technique with ChloraPrep.  Surgical site confirmation was performed.  A supraumbilical incision was made down to the fascia.  A Veress needle was introduced into the abdominal cavity and confirmation of placement was done using the saline drop test.  The abdomen was then insufflated to 15 mmHg pressure.  An 11 mm trocar was introduced into the abdominal cavity under direct visualization without difficulty.  The patient was placed in reverse Trendelenburg position and an additional level meter trocar was placed in the epigastric region and 5 mm trochars were placed the right upper quadrant and right flank regions.  The liver was inspected and noted to be enlarged consistent with a fatty liver.  The gallbladder was retracted in a dynamic fashion in order to provide a critical view of the triangle of [place]ot.  The cystic duct was first identified.  Its junction to the infundibulum was fully identified.  Endoclips were placed proximally and distally on the cystic duct, and the cystic duct was divided.  This was likewise done the cystic artery.  The gallbladder was freed away from the  gallbladder fossa using Bovie electrocautery.  The gallbladder was delivered through the epigastric trocar site using an Endo Catch bag.  The gallbladder fossa was inspected and no abnormal bleeding or bile leakage was noted.  Surgicel was placed in the gallbladder fossa.  All fluid and air were then evacuated from the abdominal cavity prior to removal of the trochars.  All wounds were irrigated with normal saline.  All wounds were injected with Exparel.  All skin incisions were closed using a 4-0 Monocryl subcuticular suture.  Dermabond was applied.  All tape and needle counts were correct at the end of the procedure.  The patient was extubated in the operating room and transferred to PACU in stable condition.  Complications: None  EBL: Minimal  Specimen: Gallbladder

## 2019-12-25 NOTE — Anesthesia Procedure Notes (Signed)
Procedure Name: Intubation Performed by: Tacy Learn, CRNA Patient Re-evaluated:Patient Re-evaluated prior to induction Oxygen Delivery Method: Circle system utilized Preoxygenation: Pre-oxygenation with 100% oxygen Induction Type: IV induction Laryngoscope Size: Miller and 2 Grade View: Grade I Tube type: Oral Number of attempts: 1 Airway Equipment and Method: Stylet Placement Confirmation: ETT inserted through vocal cords under direct vision,  positive ETCO2,  CO2 detector and breath sounds checked- equal and bilateral Secured at: 21 cm Tube secured with: Tape

## 2019-12-25 NOTE — Anesthesia Preprocedure Evaluation (Signed)
Anesthesia Evaluation  Patient identified by MRN, date of birth, ID band Patient awake    Reviewed: Allergy & Precautions, NPO status , Patient's Chart, lab work & pertinent test results  History of Anesthesia Complications Negative for: history of anesthetic complications  Airway Mallampati: II  TM Distance: >3 FB Neck ROM: Full    Dental  (+) Edentulous Upper, Edentulous Lower   Pulmonary COPD,  COPD inhaler,    Pulmonary exam normal breath sounds clear to auscultation       Cardiovascular Exercise Tolerance: Good hypertension, Pt. on medications Normal cardiovascular exam Rhythm:Regular Rate:Normal - Systolic murmurs, - Diastolic murmurs, - Friction Rub, - Carotid Bruit, - Peripheral Edema and - Systolic Click [id number] [facility]-OPS ROUTINE RECORD Sinus rhythm with Premature atrial complexes with Abberant conduction Right bundle branch block Abnormal ECG   Neuro/Psych  Headaches, Seizures -, Well Controlled,  PSYCHIATRIC DISORDERS Anxiety Depression Dementia    GI/Hepatic GERD  Medicated and Controlled,Cholecystitis    Endo/Other  diabetes, Poorly Controlled, Type 2, Insulin Dependent, Oral Hypoglycemic Agents  Renal/GU negative Renal ROS     Musculoskeletal  (+) Arthritis ,   Abdominal   Peds  Hematology  (+) anemia ,   Anesthesia Other Findings   Reproductive/Obstetrics                            Anesthesia Physical Anesthesia Plan  ASA: III  Anesthesia Plan: General   Post-op Pain Management:    Induction: Intravenous  PONV Risk Score and Plan: 4 or greater and Ondansetron, Midazolam and Metaclopromide  Airway Management Planned: Oral ETT  Additional Equipment:   Intra-op Plan:   Post-operative Plan: Extubation in OR  Informed Consent: I have reviewed the patients History and Physical, chart, labs and discussed the procedure including  the risks, benefits and alternatives for the proposed anesthesia with the patient or authorized representative who has indicated his/her understanding and acceptance.       Plan Discussed with: CRNA  Anesthesia Plan Comments:         Anesthesia Quick Evaluation

## 2019-12-26 ENCOUNTER — Telehealth: Payer: Self-pay | Admitting: "Endocrinology

## 2019-12-26 LAB — SURGICAL PATHOLOGY

## 2019-12-26 MED ORDER — INSULIN LISPRO (1 UNIT DIAL) 100 UNIT/ML (KWIKPEN): 20.0000 [IU] | PEN_INJECTOR | Freq: Three times a day (TID) | SUBCUTANEOUS | 0 refills | Status: DC

## 2019-12-26 MED ORDER — LEVEMIR FLEXTOUCH 100 UNIT/ML ~~LOC~~ SOPN: 60.0000 [IU] | PEN_INJECTOR | Freq: Two times a day (BID) | SUBCUTANEOUS | 0 refills | Status: DC

## 2019-12-26 NOTE — Telephone Encounter (Signed)
Pt requesting refill on Humalog and [place]

## 2019-12-26 NOTE — Telephone Encounter (Signed)
Rx refill(s) sent.

## 2019-12-27 ENCOUNTER — Ambulatory Visit: Payer: Medicare Other | Admitting: Gastroenterology

## 2019-12-30 ENCOUNTER — Ambulatory Visit: Payer: Medicare Other | Admitting: "Endocrinology

## 2019-12-31 NOTE — Addendum Note (Signed)
Addendum  created 12/31/19 1206 by Ollen Bowl, CRNA   Charge Capture section accepted

## 2020-01-01 ENCOUNTER — Telehealth: Payer: Self-pay | Admitting: Family Medicine

## 2020-01-01 LAB — MICROALBUMIN, URINE: Microalb, Ur: 3.7

## 2020-01-01 LAB — BASIC METABOLIC PANEL
BUN: 15 (ref 4–21)
Creatinine: 0.8 (ref 0.5–1.1)

## 2020-01-01 LAB — TSH: TSH: 1.11 (ref 0.41–5.90)

## 2020-01-01 LAB — VITAMIN D 25 HYDROXY (VIT D DEFICIENCY, FRACTURES): Vit D, 25-Hydroxy: 21.8

## 2020-01-01 NOTE — Telephone Encounter (Signed)
Pt called requesting a refill on her pain medication. She states that she is still hurting and has taken some tylenol but it has not helped. She also c/o her stomach still hurting like before and is very nauseated. (she made the comment that she may be eating too much).

## 2020-01-02 ENCOUNTER — Encounter: Payer: Self-pay | Admitting: General Surgery

## 2020-01-02 ENCOUNTER — Telehealth: Payer: Self-pay | Admitting: Emergency Medicine

## 2020-01-02 ENCOUNTER — Telehealth (INDEPENDENT_AMBULATORY_CARE_PROVIDER_SITE_OTHER): Payer: Self-pay | Admitting: General Surgery

## 2020-01-02 ENCOUNTER — Other Ambulatory Visit: Payer: Self-pay | Admitting: Gastroenterology

## 2020-01-02 ENCOUNTER — Other Ambulatory Visit: Payer: Self-pay

## 2020-01-02 DIAGNOSIS — Z09 Encounter for follow-up examination after completed treatment for conditions other than malignant neoplasm: Secondary | ICD-10-CM

## 2020-01-02 DIAGNOSIS — R112 Nausea with vomiting, unspecified: Secondary | ICD-10-CM

## 2020-01-02 MED ORDER — HYDROCODONE-ACETAMINOPHEN 5-325 MG PO TABS: 1.0000 | ORAL_TABLET | Freq: Four times a day (QID) | ORAL | 0 refills | Status: DC | PRN

## 2020-01-02 MED ORDER — ONDANSETRON HCL 8 MG PO TABS: 8.0000 mg | ORAL_TABLET | Freq: Three times a day (TID) | ORAL | 0 refills | Status: DC | PRN

## 2020-01-02 NOTE — Progress Notes (Addendum)
Subjective:     Carrie Turner  Virtual visit performed.  Patient states she still has some bloating due to her gastroparesis.  Feels removal has helped her.  No fever, chills.  Mild incisional pain. Objective:    There were no vitals taken for this visit.  General:  alert, cooperative and no distress       Assessment:    Doing well postoperatively.    Plan:   She is pleased with the results.  Knows she still has other stomach issues.  Norco reordered for pain.  Follow up here prn.  Telephone postoperative visit performed.  Discussion lasted about 10 minutes.

## 2020-01-02 NOTE — Telephone Encounter (Signed)
Pt requesting a refill on ondansetron hcl 8mg  tab Take one tab every 8 hours as needed for nausea or vomiting sent into [facility]

## 2020-01-02 NOTE — Telephone Encounter (Signed)
Rx sent 

## 2020-01-06 ENCOUNTER — Encounter: Payer: Self-pay | Admitting: General Surgery

## 2020-01-06 ENCOUNTER — Ambulatory Visit (INDEPENDENT_AMBULATORY_CARE_PROVIDER_SITE_OTHER): Payer: Medicare HMO | Admitting: "Endocrinology

## 2020-01-06 ENCOUNTER — Other Ambulatory Visit: Payer: Self-pay | Admitting: Gastroenterology

## 2020-01-06 ENCOUNTER — Encounter: Payer: Self-pay | Admitting: "Endocrinology

## 2020-01-06 ENCOUNTER — Other Ambulatory Visit: Payer: Self-pay

## 2020-01-06 VITALS — BP 130/81 | HR 76 | Ht 62.0 in | Wt 192.0 lb

## 2020-01-06 DIAGNOSIS — E118 Type 2 diabetes mellitus with unspecified complications: Secondary | ICD-10-CM

## 2020-01-06 DIAGNOSIS — E1165 Type 2 diabetes mellitus with hyperglycemia: Secondary | ICD-10-CM

## 2020-01-06 DIAGNOSIS — I10 Essential (primary) hypertension: Secondary | ICD-10-CM

## 2020-01-06 DIAGNOSIS — E782 Mixed hyperlipidemia: Secondary | ICD-10-CM | POA: Diagnosis not present

## 2020-01-06 DIAGNOSIS — IMO0002 Reserved for concepts with insufficient information to code with codable children: Secondary | ICD-10-CM

## 2020-01-06 DIAGNOSIS — Z794 Long term (current) use of insulin: Secondary | ICD-10-CM

## 2020-01-06 NOTE — Progress Notes (Signed)
01/06/2020  Endocrinology follow-up note  Subjective:    Patient ID: TRENIA TENNYSON, female    DOB: 22-Jan-1951.  She is being seen in follow-up  for management of diabetes requested by  Monico Blitz, MD  Past Medical History:  Diagnosis Date  . Anxiety   . Arthritis   . COPD (chronic obstructive pulmonary disease) ([place])   . Dementia ([place]) 09/30/2019   per patient, early dementia diagnosed after seeing PCP recently  . Depression   . Diabetes mellitus, type II ([place])   . GERD (gastroesophageal reflux disease)   . Glaucoma   . Hyperlipidemia   . Hypertension   . Migraines   . Neuropathy   . Seizures ([place])    seizure was from ETOH, "a long time ago", no med and no recurrance  . Vitamin D deficiency     Past Surgical History:  Procedure Laterality Date  . ABDOMINAL HYSTERECTOMY    . CATARACT EXTRACTION W/PHACO Left 07/24/2019   Procedure: CATARACT EXTRACTION PHACO AND INTRAOCULAR LENS PLACEMENT LEFT EYE  (CDE: 5.60);  Surgeon: Baruch Goldmann, MD;  Location: [facility];  Service: Ophthalmology;  Laterality: Left;  . CATARACT EXTRACTION W/PHACO Right 08/19/2019   Procedure: CATARACT EXTRACTION PHACO AND INTRAOCULAR LENS PLACEMENT RIGHT EYE;  Surgeon: Baruch Goldmann, MD;  Location: [facility];  Service: Ophthalmology;  Laterality: Right;  CDE: 8.83  . CHOLECYSTECTOMY N/A 12/25/2019   Procedure: LAPAROSCOPIC CHOLECYSTECTOMY;  Surgeon: Aviva Signs, MD;  Location: [facility];  Service: General;  Laterality: N/A;  . COLONOSCOPY  11/2015   Dr. Britta Mccreedy: sessile polyp removed (benign). advised repeat colonoscopy in 5 years.   . COLONOSCOPY WITH PROPOFOL N/A 10/26/2017   RMR: Tubular adenoma removed.  Random colon biopsies negative.  Nonbleeding internal hemorrhoids.  Next colonoscopy in 5 years.  . ESOPHAGEAL DILATION  10/26/2017   Procedure: ESOPHAGEAL DILATION;  Surgeon: Daneil Dolin, MD;  Location: [facility];  Service: Endoscopy;;  . ESOPHAGOGASTRODUODENOSCOPY  11/2015   Dr. Britta Mccreedy: hiatal hernia   . ESOPHAGOGASTRODUODENOSCOPY (EGD) WITH PROPOFOL N/A 10/26/2017   RMR: Erosive reflux esophagitis, hiatal hernia.  Small bowel biopsies negative.  . INSERTION OF ANTERIOR SEGMENT AQUEOUS DRAINAGE DEVICE (ISTENT) Left 07/24/2019   Procedure: INSERTION OF ANTERIOR SEGMENT AQUEOUS DRAINAGE DEVICE (ISTENT) LEFT EYE;  Surgeon: Baruch Goldmann, MD;  Location: [facility];  Service: Ophthalmology;  Laterality: Left;  . INSERTION OF ANTERIOR SEGMENT AQUEOUS DRAINAGE DEVICE (ISTENT) Right 08/19/2019   Procedure: INSERTION OF ANTERIOR SEGMENT AQUEOUS DRAINAGE DEVICE (ISTENT) RIGHT EYE;  Surgeon: Baruch Goldmann, MD;  Location: [facility];  Service: Ophthalmology;  Laterality: Right;  . KNEE ARTHROSCOPY Right   . ORIF ANKLE FRACTURE Right      Social History   Socioeconomic History  . Marital status: Single    Spouse name: Not on file  . Number of children: Not on file  . Years of education: Not on file  . Highest education level: Not on file  Occupational History  . Not on file  Tobacco Use  . Smoking status: Never Smoker  . Smokeless tobacco: Never Used  Vaping Use  . Vaping Use: Never used  Substance and Sexual Activity  . Alcohol use: No  . Drug use: No  . Sexual activity: Never  Other Topics Concern  . Not on file  Social History Narrative  . Not on file   Social Determinants of Health   Financial Resource Strain:   . Difficulty of Paying Living Expenses:   Food Insecurity:   .  Worried About Charity fundraiser in the Last Year:   . Arboriculturist in the Last Year:   Transportation Needs:   . Film/video editor (Medical):   Marland Kitchen Lack of Transportation (Non-Medical):   Physical Activity:   . Days of Exercise per Week:   . Minutes of Exercise per Session:   Stress:   . Feeling of Stress :   Social Connections:   . Frequency of Communication with Friends and Family:   . Frequency of Social Gatherings with Friends and Family:   . Attends Religious Services:   . Active Member of Clubs  or Organizations:   . Attends Archivist Meetings:   Marland Kitchen Marital Status:   Intimate Partner Violence:   . Fear of Current or Ex-Partner:   . Emotionally Abused:   Marland Kitchen Physically Abused:   . Sexually Abused:     Current Outpatient Medications on File Prior to Visit  Medication Sig Dispense Refill  . albuterol (VENTOLIN HFA) 108 (90 Base) MCG/ACT inhaler Inhale 1-2 puffs into the lungs every 6 (six) hours as needed for wheezing or shortness of breath.    Marland Kitchen amLODipine (NORVASC) 5 MG tablet Take 5 mg by mouth daily.     Marland Kitchen aspirin EC 81 MG tablet Take 81 mg by mouth daily.    . Blood Glucose Monitoring Suppl (ONETOUCH VERIO) w/Device KIT 1 each by Does not apply route 4 (four) times daily. E11.65 1 kit 0  . Cholecalciferol (VITAMIN D3) 2000 units TABS Take 2,000 Units by mouth daily.    . citalopram (CELEXA) 40 MG tablet Take 40 mg by mouth daily.    . Continuous Blood Gluc Sensor (FREESTYLE LIBRE 14 DAY SENSOR) MISC Inject 1 each into the skin every 14 (fourteen) days. Use as directed. 2 each 2  . dexlansoprazole (DEXILANT) 60 MG capsule Take 1 capsule (60 mg total) by mouth daily. 90 capsule 3  . donepezil (ARICEPT) 5 MG tablet Take 5 mg by mouth at bedtime.    . eszopiclone (LUNESTA) 2 MG TABS tablet Take 2 mg by mouth at bedtime as needed for sleep. Take immediately before bedtime    . fenofibrate (TRICOR) 145 MG tablet TAKE 1 TABLET ONCE DAILY. (Patient taking differently: Take 145 mg by mouth daily. ) 90 tablet 0  . insulin detemir (LEVEMIR FLEXTOUCH) 100 UNIT/ML FlexPen Inject 60 Units into the skin 2 (two) times daily. 60 mL 0  . insulin lispro (HUMALOG) 100 UNIT/ML KwikPen Inject 0.2-0.26 mLs (20-26 Units total) into the skin 3 (three) times daily before meals. 30 mL 0  . Insulin Pen Needle (PEN NEEDLES) 31G X 6 MM MISC 1 each by Does not apply route 4 (four) times daily. 400 each 1  . lisinopril-hydrochlorothiazide (PRINZIDE,ZESTORETIC) 20-12.5 MG tablet Take 2 tablets by mouth  daily.     . ondansetron (ZOFRAN) 8 MG tablet Take 1 tablet (8 mg total) by mouth every 8 (eight) hours as needed for nausea or vomiting. 30 tablet 0  . ONETOUCH VERIO test strip USE 1 STRIP TO CHECK GLUCOSE 4 TIMES DAILY. 150 strip 0  . pravastatin (PRAVACHOL) 40 MG tablet Take 40 mg by mouth daily.    . Prucalopride Succinate (MOTEGRITY) 2 MG TABS Take 1 tablet (2 mg total) by mouth daily. 30 tablet 3  . pyridOXINE (VITAMIN B-6) 100 MG tablet Take 100 mg by mouth daily.    . vitamin B-12 (CYANOCOBALAMIN) 500 MCG tablet Take 500 mcg by mouth daily.  No current facility-administered medications on file prior to visit.    No facility-administered encounter medications on file as of 10/16/2018.    Allergies  Allergen Reactions  . Sulfa Antibiotics Other (See Comments)    Weakness and fatigued      VACCINATION STATUS:  There is no immunization history on file for this patient.  Diabetes  She presents for her follow-up diabetic visit. She has type 2 diabetes mellitus. Onset time: She was diagnosed at approximate age of 6 years.   Her disease course has been fluctuating, recently showing some improvement.  She presents with significantly above target glycemic profile both fasting and postprandial despite her current large dose insulin.   She has no hypoglycemia documented or reported.  -Pertinent negatives for hypoglycemia include no confusion, headaches, pallor or seizures. Associated symptoms include fatigue, polydipsia and polyuria. Pertinent negatives for diabetes include no blurred vision, no chest pain and no polyphagia. There are no hypoglycemic complications.   There are no diabetic complications. Risk factors for coronary artery disease include diabetes mellitus, dyslipidemia, obesity and sedentary lifestyle.  -Her point-of-care A1c is 9.9%, increasing from 9.2%.      She is following a generally unhealthy diet. When asked about meal planning, she reported none. She has had  a previous visit with a dietitian (She will see the dietitian today.). She never participates in exercise.   ) An ACE inhibitor/angiotensin II receptor blocker is not being taken. Eye exam is current.    Hyperlipidemia  This is a chronic problem. The current episode started more than 1 year ago. Recent lipid tests were reviewed and are high. Exacerbating diseases include diabetes and obesity. Pertinent negatives include no chest pain, myalgias or shortness of breath. She is currently on no antihyperlipidemic treatment. Compliance problems include adherence to diet and adherence to exercise.  Risk factors for coronary artery disease include diabetes mellitus, dyslipidemia, hypertension, obesity and a sedentary lifestyle.     Objective:    Recent Results (from the past 2160 hour(s))  Comprehensive metabolic panel     Status: Abnormal   Collection Time: 11/21/19  1:16 PM  Result Value Ref Range   Sodium 136 135 - 145 mmol/L   Potassium 2.9 (L) 3.5 - 5.1 mmol/L   Chloride 97 (L) 98 - 111 mmol/L   CO2 24 22 - 32 mmol/L   Glucose, Bld 183 (H) 70 - 99 mg/dL    Comment: Glucose reference range applies only to samples taken after fasting for at least 8 hours.   BUN 16 8 - 23 mg/dL   Creatinine, Ser 0.58 0.44 - 1.00 mg/dL   Calcium 9.1 8.9 - 10.3 mg/dL   Total Protein 7.6 6.5 - 8.1 g/dL   Albumin 4.0 3.5 - 5.0 g/dL   AST 22 15 - 41 U/L   ALT 29 0 - 44 U/L   Alkaline Phosphatase 97 38 - 126 U/L   Total Bilirubin 0.4 0.3 - 1.2 mg/dL   GFR calc non Af Amer >60 >60 mL/min   GFR calc Af Amer >60 >60 mL/min   Anion gap 15 5 - 15    Comment: Performed at [facility], [address], [place], [place] [address]  Gamma GT     Status: None   Collection Time: 11/21/19  1:16 PM  Result Value Ref Range   GGT 27 7 - 50 U/L    Comment: Performed at [facility], [place] [address], [place],  [address]  CBC  Status: Abnormal   Collection Time: 12/20/19  1:17 PM  Result Value Ref Range    WBC 12.3 (H) 4.0 - 10.5 K/uL   RBC 4.16 3.87 - 5.11 MIL/uL   Hemoglobin 12.8 12.0 - 15.0 g/dL   HCT 40.6 36 - 46 %   MCV 97.6 80.0 - 100.0 fL   MCH 30.8 26.0 - 34.0 pg   MCHC 31.5 30.0 - 36.0 g/dL   RDW 11.7 11.5 - 15.5 %   Platelets 490 (H) 150 - 400 K/uL   nRBC 0.0 0.0 - 0.2 %    Comment: Performed at [facility], [address] [place], [place] [address]  Hemoglobin A1c     Status: Abnormal   Collection Time: 12/20/19  1:17 PM  Result Value Ref Range   Hgb A1c MFr Bld 9.9 (H) 4.8 - 5.6 %    Comment: (NOTE) Pre diabetes:          5.7%-6.4% Diabetes:              >6.4% Glycemic control for   <7.0% adults with diabetes    Mean Plasma Glucose 237.43 mg/dL    Comment: Performed at [facility], [place] [address], [place], [place] [address]  Basic metabolic panel     Status: Abnormal   Collection Time: 12/20/19  1:17 PM  Result Value Ref Range   Sodium 136 135 - 145 mmol/L   Potassium 3.6 3.5 - 5.1 mmol/L   Chloride 98 98 - 111 mmol/L   CO2 25 22 - 32 mmol/L   Glucose, Bld 304 (H) 70 - 99 mg/dL    Comment: Glucose reference range applies only to samples taken after fasting for at least 8 hours.   BUN 13 8 - 23 mg/dL   Creatinine, Ser 0.73 0.44 - 1.00 mg/dL   Calcium 9.2 8.9 - 10.3 mg/dL   GFR calc non Af Amer >60 >60 mL/min   GFR calc Af Amer >60 >60 mL/min   Anion gap 13 5 - 15    Comment: Performed at [facility], [address], [place], [place] [address]  SARS CORONAVIRUS 2 (TAT 6-24 HRS) Nasopharyngeal Nasopharyngeal Swab     Status: None   Collection Time: 12/24/19  8:08 AM   Specimen: Nasopharyngeal Swab  Result Value Ref Range   SARS Coronavirus 2 NEGATIVE NEGATIVE    Comment: (NOTE) SARS-CoV-2 target nucleic acids are NOT DETECTED. The SARS-CoV-2 RNA is generally detectable in upper and lower respiratory specimens during the acute phase of infection. Negative results do not preclude SARS-CoV-2 infection, do not rule out co-infections with other pathogens,  and should not be used as the sole basis for treatment or other patient management decisions. Negative results must be combined with clinical observations, patient history, and epidemiological information. The expected result is Negative. Fact Sheet for Patients: SugarRoll.be Fact Sheet for Healthcare Providers: https://www.woods-mathews.com/ This test is not yet approved or cleared by the [place] FDA and  has been authorized for detection and/or diagnosis of SARS-CoV-2 by FDA under an Emergency Use Authorization (EUA). This EUA will remain  in effect (meaning this test can be used) for the duration of the COVID-19 declaration under Section 56 4(b)(1) of the Act, 21 U.S.C. section 360bbb-3(b)(1), unless the authorization is terminated or revoked sooner. Performed at [facility], [place] [address], [place], [place] [address]   Glucose, capillary     Status: Abnormal   Collection Time: 12/25/19  9:17 AM  Result  Value Ref Range   Glucose-Capillary 225 (H) 70 - 99 mg/dL    Comment: Glucose reference range applies only to samples taken after fasting for at least 8 hours.  Surgical pathology     Status: None   Collection Time: 12/25/19 10:18 AM  Result Value Ref Range   SURGICAL PATHOLOGY      SURGICAL PATHOLOGY CASE: [id number] PATIENT: Mirtha Wigle Surgical Pathology Report     Clinical History: biliary colic     FINAL MICROSCOPIC DIAGNOSIS:  A. GALLBLADDER, CHOLECYSTECTOMY: - Chronic cholecystitis. - There is no evidence of malignancy.   GROSS DESCRIPTION:  Specimen: Gallbladder, received in formalin Size/?Intact: 8.3 x 3.4 x 2.2 cm intact Serosal surface: Yellow-pink to dark red-purple, smooth Mucosa/Wall: The mucosa is pink-green to dark red, smooth, soft, with scattered faint yellow streaking.  The wall is up to 0.4 cm thick. Contents: Bile, no gross stones Cystic duct: Patent Block  Summary: Representative sections in one block.  SW 12/25/2019    Final Diagnosis performed by Enid Cutter, MD.   Electronically signed 12/26/2019 Technical component performed at [facility], [place] [address], [place], [place] [address].  Professional component performed at [employer]. [facility], [place] [address] [place], [place] [address].  Immunohistochemistry Technical component (if applicable) was performed at [facility]. [address] [place], [place],  [address].   IMMUNOHISTOCHEMISTRY DISCLAIMER (if applicable): Some of these immunohistochemical stains may have been developed and the performance characteristics determine by [facility]. Some may not have been cleared or approved by the U.S. Food and Drug Administration. The FDA has determined that such clearance or approval is not necessary. This test is used for clinical purposes. It should not be regarded as investigational or for research. This laboratory is certified under the [place] (CLIA-88) as qualified to perform high complexity clinical laboratory testing.  The controls stained appropriately.   Microalbumin, urine     Status: None   Collection Time: 01/01/20 12:00 AM  Result Value Ref Range   Microalb, Ur 3.7   Basic metabolic panel     Status: None   Collection Time: 01/01/20 12:00 AM  Result Value Ref Range   BUN 15 4 - 21   Creatinine 0.8 0.5 - 1.1  TSH     Status: None   Collection Time: 01/01/20 12:00 AM  Result Value Ref Range   TSH 1.11 0.41 - 5.90    Comment: free t4 0.92    Labs from June 18, 2018 showed BUN 8 creatinine 051, A1c 11% Labs from January 24, 2018 showed BUN 9,  Creatinine 0.58, A1c 10.7% September 20, 2017 A1c 10.5%  Lipid Panel     Component Value Date/Time   CHOL 372 (A) 02/06/2019 0000   TRIG 668 (A) 02/06/2019 0000   HDL 56 02/06/2019 0000     Assessment & Plan:    1. Uncontrolled type 2 diabetes mellitus with complication, with long-term current use of insulin ([place])  - Patient has currently uncontrolled symptomatic type 2 DM since  69 years of age. -She presents with severely uncontrolled glycemic profile despite high-dose insulin.  Point-of-care A1c is higher at 9.9%, increasing from 9.2%.      - Recent labs are reviewed, showing normal renal function.   Her diabetes is complicated by noncompliance/nonadherence, obesity and sedentary life and patient remains at a high risk for more acute and chronic complications of diabetes which include CAD, CVA, CKD,  retinopathy, and neuropathy. These are all discussed in detail with the patient.  - she  admits there is a room for improvement in her diet and drink choices. -  Suggestion is made for her to avoid simple carbohydrates  from her diet including Cakes, Sweet Desserts / Pastries, Ice Cream, Soda (diet and regular), Sweet Tea, Candies, Chips, Cookies, Sweet Pastries,  Store Bought Juices, Alcohol in Excess of  1-2 drinks a day, Artificial Sweeteners, Coffee Creamer, and "Sugar-free" Products. This will help patient to have stable blood glucose profile and potentially avoid unintended weight gain.   - I have approached patient with the following individualized plan to manage diabetes and patient agrees:   -she is struggling to achieve control of diabetes, likely deals with moderate cognitive deficit.    -She we will continue to require intensive treatment with basal/bolus insulin in order for her to achieve control of diabetes to target.    -She has questionable compliance and cognitive ability, her treatment will be adjusted slowly.   She has not been taking her Levemir properly. -She is advised to resume and continue her Levemir 60 units twice daily,  continue Humalog  20  units 3 times daily  before meals for pre-meal BG readings of 90 -164m/dl, plus patient specific correction dose for unexpected  hyperglycemia above 1558mdl, associated with strict monitoring of glucose 4 times a day-before meals and at bedtime.  -She will be considered for insulin U500 on subsequent visits for simplicity reasons, she will return in 4 weeks with her leftover Levemir and Humalog as well as her meter and logs. - Patient is warned not to take insulin without proper monitoring per orders.  -Patient is encouraged to call clinic for blood glucose levels less than 70 or above 300 mg /dl.  She did not tolerate retrial of low-dose metformin.  She is advised to discontinue.    -She is advised to continue glipizide 5 mg p.o.  at breakfast.   -She is not a reliable candidate for incretin therapy.    2) Lipids/HPL: She has severely uncontrolled hypertriglyceridemia, LDL not calculated.  She is advised to continue pravastatin 40 mg p.o. nightly, advised to eliminate fried food and butter from her diet.  She is also on fenofibrate 145 mg p.o. nightly.   3) hypertension: Her blood pressure is controlled to target. She is advised to continue follow-up lisinopril/hydrochlorothiazide 20/12.5 mg daily.   She is advised to continue follow-up closely with Dr. ShManuella Ghazior primary care needs.  - Time spent on this patient care encounter:  35 min, of which > 50% was spent in  counseling and the rest reviewing her blood glucose logs , discussing her hypoglycemia and hyperglycemia episodes, reviewing her current and  previous labs / studies  ( including abstraction from other facilities) and medications  doses and developing a  long term treatment plan and documenting her care.   Please refer to Patient Instructions for Blood Glucose Monitoring and Insulin/Medications Dosing Guide"  in media tab for additional information. Please  also refer to " Patient Self Inventory" in the Media  tab for reviewed elements of pertinent patient history.  EvOrlena Sheldonarticipated in the discussions, expressed understanding, and voiced  agreement with the above plans.  All questions were answered to her satisfaction. she is encouraged to contact clinic should she have any questions or concerns prior to her return visit.    Follow up plan: -Return in 3 months with labs, logs, and meter. GeHeriberto Antigua  Dorris Fetch, MD Phone: [phone]  Fax: [phone]  -  This note was partially dictated with voice recognition software. Similar sounding words can be transcribed inadequately or may not  be corrected upon review.  10/16/2018, 1:34 PM

## 2020-01-06 NOTE — Patient Instructions (Signed)

## 2020-01-08 ENCOUNTER — Ambulatory Visit: Payer: Medicare Other | Admitting: Nutrition

## 2020-01-09 ENCOUNTER — Other Ambulatory Visit: Payer: Self-pay

## 2020-01-09 DIAGNOSIS — E782 Mixed hyperlipidemia: Secondary | ICD-10-CM

## 2020-01-09 MED ORDER — FENOFIBRATE 145 MG PO TABS: 145.0000 mg | ORAL_TABLET | Freq: Every day | ORAL | 0 refills | Status: DC

## 2020-01-13 ENCOUNTER — Telehealth: Payer: Self-pay | Admitting: Emergency Medicine

## 2020-01-13 NOTE — Telephone Encounter (Signed)
PA Response - Approved for motegeity [facility] notified/ lmom for patient

## 2020-01-13 NOTE — Telephone Encounter (Signed)
Pa started for motegrity 2mg   Key: [id number] - PA Case ID: [id number] Waiting on response from insurance

## 2020-01-23 ENCOUNTER — Telehealth: Payer: Self-pay

## 2020-01-23 NOTE — Telephone Encounter (Signed)
Spoke with patient.  She continues with the same intermittent upper abdominal pain that she had when I saw her in the office.  Mild improvement after cholecystectomy.  Also waking with morning nausea which she also had a office visit.  Overall, suspect symptoms are related to delayed gastric emptying.  Patient took Carafate this morning which significantly improved her abdominal pain.  She has stopped taking Carafate.  Also admits to eating fried/fatty foods intermittently which causes worsening symptoms and eating late in the evenings.  Advised to follow a low-fat/low fiber diet, 4-6 small meals daily, no eating after 4-5 PM, resume Carafate 3 times daily with meals and at bedtime, continue Dexilant, use Zofran as needed.  She will call with a progress report in 1-2 weeks.  If no significant improvement in symptoms, will consider trying Reglan.  Carrie Turner

## 2020-01-23 NOTE — Telephone Encounter (Signed)
Noted  

## 2020-01-23 NOTE — Telephone Encounter (Signed)
Spoke with pt. Pt is having some upper abdominal pain x 2 weeks. Pt gallbladder was removed 1 month ago. Pt was asked if she has a dull ache or sharp pain. Pt said she doesn't know the type of pain she's having. Most of the time pain starts in the mornings. Pt doesn't feel it's associated with eating.  Pt reports no nausea, no vomiting, no fever. Pt says she is taking the Dexilant, Carfate as directed before meals as directed. Pt is having normal BM daily. Pt reports no diarrhea, no blood in stool.

## 2020-02-06 ENCOUNTER — Other Ambulatory Visit: Payer: Self-pay

## 2020-02-06 ENCOUNTER — Encounter: Payer: Self-pay | Admitting: "Endocrinology

## 2020-02-06 ENCOUNTER — Ambulatory Visit (INDEPENDENT_AMBULATORY_CARE_PROVIDER_SITE_OTHER): Payer: Medicare Other | Admitting: "Endocrinology

## 2020-02-06 VITALS — BP 158/86 | HR 93 | Ht 62.0 in | Wt 195.6 lb

## 2020-02-06 DIAGNOSIS — E1165 Type 2 diabetes mellitus with hyperglycemia: Secondary | ICD-10-CM | POA: Diagnosis not present

## 2020-02-06 DIAGNOSIS — E118 Type 2 diabetes mellitus with unspecified complications: Secondary | ICD-10-CM

## 2020-02-06 DIAGNOSIS — IMO0002 Reserved for concepts with insufficient information to code with codable children: Secondary | ICD-10-CM

## 2020-02-06 DIAGNOSIS — Z794 Long term (current) use of insulin: Secondary | ICD-10-CM

## 2020-02-06 DIAGNOSIS — E782 Mixed hyperlipidemia: Secondary | ICD-10-CM | POA: Diagnosis not present

## 2020-02-06 DIAGNOSIS — I10 Essential (primary) hypertension: Secondary | ICD-10-CM | POA: Diagnosis not present

## 2020-02-06 NOTE — Progress Notes (Signed)
02/06/2020  Endocrinology follow-up note  Subjective:    Patient ID: Carrie Turner, female    DOB: October 06, 1950.  She is being seen in follow-up  for management of diabetes requested by  Monico Blitz, MD  Past Medical History:  Diagnosis Date  . Anxiety   . Arthritis   . COPD (chronic obstructive pulmonary disease) ([place])   . Dementia ([place]) 09/30/2019   per patient, early dementia diagnosed after seeing PCP recently  . Depression   . Diabetes mellitus, type II ([place])   . GERD (gastroesophageal reflux disease)   . Glaucoma   . Hyperlipidemia   . Hypertension   . Migraines   . Neuropathy   . 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 Vitamin D deficiency     Past Surgical History:  Procedure Laterality Date  . ABDOMINAL HYSTERECTOMY    . CATARACT EXTRACTION W/PHACO Left 07/24/2019   Procedure: CATARACT EXTRACTION PHACO AND INTRAOCULAR LENS PLACEMENT LEFT EYE  (CDE: 5.60);  Surgeon: Baruch Goldmann, MD;  Location: [facility];  Service: Ophthalmology;  Laterality: Left;  . CATARACT EXTRACTION W/PHACO Right 08/19/2019   Procedure: CATARACT EXTRACTION PHACO AND INTRAOCULAR LENS PLACEMENT RIGHT EYE;  Surgeon: Baruch Goldmann, MD;  Location: [facility];  Service: Ophthalmology;  Laterality: Right;  CDE: 8.83  . CHOLECYSTECTOMY N/A 12/25/2019   Procedure: LAPAROSCOPIC CHOLECYSTECTOMY;  Surgeon: Aviva Signs, MD;  Location: [facility];  Service: General;  Laterality: N/A;  . COLONOSCOPY  11/2015   Dr. Britta Mccreedy: sessile polyp removed (benign). advised repeat colonoscopy in 5 years.   . COLONOSCOPY WITH PROPOFOL N/A 10/26/2017   RMR: Tubular adenoma removed.  Random colon biopsies negative.  Nonbleeding internal hemorrhoids.  Next colonoscopy in 5 years.  . ESOPHAGEAL DILATION  10/26/2017   Procedure: ESOPHAGEAL DILATION;  Surgeon: Daneil Dolin, MD;  Location: [facility];  Service: Endoscopy;;  . ESOPHAGOGASTRODUODENOSCOPY  11/2015   Dr. Britta Mccreedy: hiatal hernia   . ESOPHAGOGASTRODUODENOSCOPY (EGD) WITH PROPOFOL N/A 10/26/2017   RMR: Erosive reflux esophagitis, hiatal hernia.  Small bowel biopsies negative.  . INSERTION OF ANTERIOR SEGMENT AQUEOUS DRAINAGE DEVICE (ISTENT) Left 07/24/2019   Procedure: INSERTION OF ANTERIOR SEGMENT AQUEOUS DRAINAGE DEVICE (ISTENT) LEFT EYE;  Surgeon: Baruch Goldmann, MD;  Location: [facility];  Service: Ophthalmology;  Laterality: Left;  . INSERTION OF ANTERIOR SEGMENT AQUEOUS DRAINAGE DEVICE (ISTENT) Right 08/19/2019   Procedure: INSERTION OF ANTERIOR SEGMENT AQUEOUS DRAINAGE DEVICE (ISTENT) RIGHT EYE;  Surgeon: Baruch Goldmann, MD;  Location: [facility];  Service: Ophthalmology;  Laterality: Right;  . KNEE ARTHROSCOPY Right   . ORIF ANKLE FRACTURE Right      Social History   Socioeconomic History  . Marital status: Single    Spouse name: Not on file  . Number of children: Not on file  . Years of education: Not on file  . Highest education level: Not on file  Occupational History  . Not on file  Tobacco Use  . Smoking status: Never Smoker  . Smokeless tobacco: Never Used  Vaping Use  . Vaping Use: Never used  Substance and Sexual Activity  . Alcohol use: No  . Drug use: No  . Sexual activity: Never  Other Topics Concern  . Not on file  Social History Narrative  . Not on file   Social Determinants of Health   Financial Resource Strain:   . Difficulty of Paying Living Expenses:   Food Insecurity:   .  Worried About Charity fundraiser in the Last Year:   . Arboriculturist in the Last Year:   Transportation Needs:   . Film/video editor (Medical):   Marland Kitchen Lack of Transportation (Non-Medical):   Physical Activity:   . Days of Exercise per Week:   . Minutes of Exercise per Session:   Stress:   . Feeling of Stress :   Social Connections:   . Frequency of Communication with Friends and Family:   . Frequency of Social Gatherings with Friends and Family:   . Attends Religious Services:   . Active Member of Clubs  or Organizations:   . Attends Archivist Meetings:   Marland Kitchen Marital Status:   Intimate Partner Violence:   . Fear of Current or Ex-Partner:   . Emotionally Abused:   Marland Kitchen Physically Abused:   . Sexually Abused:     Current Outpatient Medications on File Prior to Visit  Medication Sig Dispense Refill  . albuterol (VENTOLIN HFA) 108 (90 Base) MCG/ACT inhaler Inhale 1-2 puffs into the lungs every 6 (six) hours as needed for wheezing or shortness of breath.    Marland Kitchen amLODipine (NORVASC) 5 MG tablet Take 5 mg by mouth daily.     Marland Kitchen aspirin EC 81 MG tablet Take 81 mg by mouth daily.    . Blood Glucose Monitoring Suppl (ONETOUCH VERIO) w/Device KIT 1 each by Does not apply route 4 (four) times daily. E11.65 1 kit 0  . Cholecalciferol (VITAMIN D3) 2000 units TABS Take 2,000 Units by mouth daily.    . citalopram (CELEXA) 40 MG tablet Take 40 mg by mouth daily.    . Continuous Blood Gluc Sensor (FREESTYLE LIBRE 14 DAY SENSOR) MISC Inject 1 each into the skin every 14 (fourteen) days. Use as directed. 2 each 2  . DEXILANT 60 MG capsule TAKE 1 CAPSULE BY MOUTH ONCE DAILY. 90 capsule 3  . donepezil (ARICEPT) 5 MG tablet Take 5 mg by mouth at bedtime.    . eszopiclone (LUNESTA) 2 MG TABS tablet Take 2 mg by mouth at bedtime as needed for sleep. Take immediately before bedtime    . fenofibrate (TRICOR) 145 MG tablet Take 1 tablet (145 mg total) by mouth daily. 90 tablet 0  . insulin detemir (LEVEMIR FLEXTOUCH) 100 UNIT/ML FlexPen Inject 60 Units into the skin 2 (two) times daily. 60 mL 0  . insulin lispro (HUMALOG) 100 UNIT/ML KwikPen Inject 0.2-0.26 mLs (20-26 Units total) into the skin 3 (three) times daily before meals. 30 mL 0  . Insulin Pen Needle (PEN NEEDLES) 31G X 6 MM MISC 1 each by Does not apply route 4 (four) times daily. 400 each 1  . lisinopril-hydrochlorothiazide (PRINZIDE,ZESTORETIC) 20-12.5 MG tablet Take 2 tablets by mouth daily.     . metoCLOPramide (REGLAN) 5 MG tablet Take 5 mg by mouth  3 (three) times daily.    . ondansetron (ZOFRAN) 8 MG tablet Take 1 tablet (8 mg total) by mouth every 8 (eight) hours as needed for nausea or vomiting. 30 tablet 0  . ONETOUCH VERIO test strip USE 1 STRIP TO CHECK GLUCOSE 4 TIMES DAILY. 150 strip 0  . pravastatin (PRAVACHOL) 40 MG tablet Take 40 mg by mouth daily.    . Prucalopride Succinate (MOTEGRITY) 2 MG TABS Take 1 tablet (2 mg total) by mouth daily. 30 tablet 3  . pyridOXINE (VITAMIN B-6) 100 MG tablet Take 100 mg by mouth daily.    . vitamin B-12 (CYANOCOBALAMIN)  500 MCG tablet Take 500 mcg by mouth daily.     No current facility-administered medications on file prior to visit.    No facility-administered encounter medications on file as of 10/16/2018.    Allergies  Allergen Reactions  . Sulfa Antibiotics Other (See Comments)    Weakness and fatigued      VACCINATION STATUS:  There is no immunization history on file for this patient.  Diabetes  She presents for her follow-up diabetic visit. She has type 2 diabetes mellitus. Onset time: She was diagnosed at approximate age of 2 years.   Her disease course has been fluctuating, recently showing some improvement.  She presents with slightly improving glycemic profile, no hypoglycemia. Her recent point-of-care A1c was 9.9%.  -Pertinent negatives for hypoglycemia include no confusion, headaches, pallor or seizures. Associated symptoms include fatigue, polydipsia and polyuria. Pertinent negatives for diabetes include no blurred vision, no chest pain and no polyphagia. There are no hypoglycemic complications.   There are no diabetic complications. Risk factors for coronary artery disease include diabetes mellitus, dyslipidemia, obesity and sedentary lifestyle.   She is following a generally unhealthy diet. When asked about meal planning, she reported none. She has had a previous visit with a dietitian (She will see the dietitian today.). She never participates in exercise.   ) An ACE  inhibitor/angiotensin II receptor blocker is not being taken. Eye exam is current.    Hyperlipidemia  This is a chronic problem. The current episode started more than 1 year ago. Recent lipid tests were reviewed and are high. Exacerbating diseases include diabetes and obesity. Pertinent negatives include no chest pain, myalgias or shortness of breath. She is currently on no antihyperlipidemic treatment. Compliance problems include adherence to diet and adherence to exercise.  Risk factors for coronary artery disease include diabetes mellitus, dyslipidemia, hypertension, obesity and a sedentary lifestyle.     Objective:    Recent Results (from the past 2160 hour(s))  Comprehensive metabolic panel     Status: Abnormal   Collection Time: 11/21/19  1:16 PM  Result Value Ref Range   Sodium 136 135 - 145 mmol/L   Potassium 2.9 (L) 3.5 - 5.1 mmol/L   Chloride 97 (L) 98 - 111 mmol/L   CO2 24 22 - 32 mmol/L   Glucose, Bld 183 (H) 70 - 99 mg/dL    Comment: Glucose reference range applies only to samples taken after fasting for at least 8 hours.   BUN 16 8 - 23 mg/dL   Creatinine, Ser 0.58 0.44 - 1.00 mg/dL   Calcium 9.1 8.9 - 10.3 mg/dL   Total Protein 7.6 6.5 - 8.1 g/dL   Albumin 4.0 3.5 - 5.0 g/dL   AST 22 15 - 41 U/L   ALT 29 0 - 44 U/L   Alkaline Phosphatase 97 38 - 126 U/L   Total Bilirubin 0.4 0.3 - 1.2 mg/dL   GFR calc non Af Amer >60 >60 mL/min   GFR calc Af Amer >60 >60 mL/min   Anion gap 15 5 - 15    Comment: Performed at [facility], [address], [place], [place] [address]  Gamma GT     Status: None   Collection Time: 11/21/19  1:16 PM  Result Value Ref Range   GGT 27 7 - 50 U/L    Comment: Performed at [facility], [place] [address] [place], [place] [address]  CBC     Status: Abnormal   Collection Time: 12/20/19  1:17  PM  Result Value Ref Range   WBC 12.3 (H) 4.0 - 10.5 K/uL   RBC 4.16 3.87 - 5.11 MIL/uL   Hemoglobin 12.8 12.0 - 15.0 g/dL   HCT 40.6 36 - 46 %    MCV 97.6 80.0 - 100.0 fL   MCH 30.8 26.0 - 34.0 pg   MCHC 31.5 30.0 - 36.0 g/dL   RDW 11.7 11.5 - 15.5 %   Platelets 490 (H) 150 - 400 K/uL   nRBC 0.0 0.0 - 0.2 %    Comment: Performed at [facility], [address] [place], [place] [address]  Hemoglobin A1c     Status: Abnormal   Collection Time: 12/20/19  1:17 PM  Result Value Ref Range   Hgb A1c MFr Bld 9.9 (H) 4.8 - 5.6 %    Comment: (NOTE) Pre diabetes:          5.7%-6.4% Diabetes:              >6.4% Glycemic control for   <7.0% adults with diabetes    Mean Plasma Glucose 237.43 mg/dL    Comment: Performed at [facility], [place] [address], [place], [place] [address]  Basic metabolic panel     Status: Abnormal   Collection Time: 12/20/19  1:17 PM  Result Value Ref Range   Sodium 136 135 - 145 mmol/L   Potassium 3.6 3.5 - 5.1 mmol/L   Chloride 98 98 - 111 mmol/L   CO2 25 22 - 32 mmol/L   Glucose, Bld 304 (H) 70 - 99 mg/dL    Comment: Glucose reference range applies only to samples taken after fasting for at least 8 hours.   BUN 13 8 - 23 mg/dL   Creatinine, Ser 0.73 0.44 - 1.00 mg/dL   Calcium 9.2 8.9 - 10.3 mg/dL   GFR calc non Af Amer >60 >60 mL/min   GFR calc Af Amer >60 >60 mL/min   Anion gap 13 5 - 15    Comment: Performed at [facility], [address], [place], [place] [address]  SARS CORONAVIRUS 2 (TAT 6-24 HRS) Nasopharyngeal Nasopharyngeal Swab     Status: None   Collection Time: 12/24/19  8:08 AM   Specimen: Nasopharyngeal Swab  Result Value Ref Range   SARS Coronavirus 2 NEGATIVE NEGATIVE    Comment: (NOTE) SARS-CoV-2 target nucleic acids are NOT DETECTED. The SARS-CoV-2 RNA is generally detectable in upper and lower respiratory specimens during the acute phase of infection. Negative results do not preclude SARS-CoV-2 infection, do not rule out co-infections with other pathogens, and should not be used as the sole basis for treatment or other patient management decisions. Negative results must be  combined with clinical observations, patient history, and epidemiological information. The expected result is Negative. Fact Sheet for Patients: SugarRoll.be Fact Sheet for Healthcare Providers: https://www.woods-mathews.com/ This test is not yet approved or cleared by the [place] FDA and  has been authorized for detection and/or diagnosis of SARS-CoV-2 by FDA under an Emergency Use Authorization (EUA). This EUA will remain  in effect (meaning this test can be used) for the duration of the COVID-19 declaration under Section 56 4(b)(1) of the Act, 21 U.S.C. section 360bbb-3(b)(1), unless the authorization is terminated or revoked sooner. Performed at [facility], [place] [address] [place], [place] [address]   Glucose, capillary     Status: Abnormal   Collection Time: 12/25/19  9:17 AM  Result Value Ref Range   Glucose-Capillary 225 (H) 70 -  99 mg/dL    Comment: Glucose reference range applies only to samples taken after fasting for at least 8 hours.  Surgical pathology     Status: None   Collection Time: 12/25/19 10:18 AM  Result Value Ref Range   SURGICAL PATHOLOGY      SURGICAL PATHOLOGY CASE: [id number] PATIENT: Keyandra Balis Surgical Pathology Report     Clinical History: biliary colic     FINAL MICROSCOPIC DIAGNOSIS:  A. GALLBLADDER, CHOLECYSTECTOMY: - Chronic cholecystitis. - There is no evidence of malignancy.   GROSS DESCRIPTION:  Specimen: Gallbladder, received in formalin Size/?Intact: 8.3 x 3.4 x 2.2 cm intact Serosal surface: Yellow-pink to dark red-purple, smooth Mucosa/Wall: The mucosa is pink-green to dark red, smooth, soft, with scattered faint yellow streaking.  The wall is up to 0.4 cm thick. Contents: Bile, no gross stones Cystic duct: Patent Block Summary: Representative sections in one block.  SW 12/25/2019    Final Diagnosis performed by Enid Cutter, MD.   Electronically  signed 12/26/2019 Technical component performed at [facility], [place] [address], [place], [place] [address].  Professional component performed at [employer]. [facility], [place] [address], [place], [place] [address].  Immunohistochemistry Technical component (if applicable) was performed at [facility]. [address], [place], [place], [place] [address].   IMMUNOHISTOCHEMISTRY DISCLAIMER (if applicable): Some of these immunohistochemical stains may have been developed and the performance characteristics determine by [facility]. Some may not have been cleared or approved by the U.S. Food and Drug Administration. The FDA has determined that such clearance or approval is not necessary. This test is used for clinical purposes. It should not be regarded as investigational or for research. This laboratory is certified under the [place] (CLIA-88) as qualified to perform high complexity clinical laboratory testing.  The controls stained appropriately.   Microalbumin, urine     Status: None   Collection Time: 01/01/20 12:00 AM  Result Value Ref Range   Microalb, Ur 3.7   Basic metabolic panel     Status: None   Collection Time: 01/01/20 12:00 AM  Result Value Ref Range   BUN 15 4 - 21   Creatinine 0.8 0.5 - 1.1  TSH     Status: None   Collection Time: 01/01/20 12:00 AM  Result Value Ref Range   TSH 1.11 0.41 - 5.90    Comment: free t4 0.92  VITAMIN D 25 Hydroxy (Vit-D Deficiency, Fractures)     Status: None   Collection Time: 01/01/20 12:00 AM  Result Value Ref Range   Vit D, 25-Hydroxy 21.8     Labs from June 18, 2018 showed BUN 8 creatinine 051, A1c 11% Labs from January 24, 2018 showed BUN 9,  Creatinine 0.58, A1c 10.7% September 20, 2017 A1c 10.5%  Lipid Panel     Component Value Date/Time   CHOL 372 (A) 02/06/2019 0000   TRIG 668 (A) 02/06/2019 0000   HDL 56 02/06/2019 0000      Assessment & Plan:   1. Uncontrolled type 2 diabetes mellitus with complication, with long-term current use of insulin ([place])  - Patient has currently uncontrolled symptomatic type 2 DM since  69 years of age. -She presents with slightly improving, however still severely uncontrolled glycemic profile despite high-dose insulin.  Point-of-care A1c is higher at 9.9%, increasing from 9.2%.      - Recent labs are reviewed, showing normal renal function.   Her diabetes is  complicated by noncompliance/nonadherence, obesity and sedentary life and patient remains at a high risk for more acute and chronic complications of diabetes which include CAD, CVA, CKD, retinopathy, and neuropathy. These are all discussed in detail with the patient.  - she  admits there is a room for improvement in her diet and drink choices. -  Suggestion is made for her to avoid simple carbohydrates  from her diet including Cakes, Sweet Desserts / Pastries, Ice Cream, Soda (diet and regular), Sweet Tea, Candies, Chips, Cookies, Sweet Pastries,  Store Bought Juices, Alcohol in Excess of  1-2 drinks a day, Artificial Sweeteners, Coffee Creamer, and "Sugar-free" Products. This will help patient to have stable blood glucose profile and potentially avoid unintended weight gain.  - I have approached patient with the following individualized plan to manage diabetes and patient agrees:   -she is struggling to achieve control of diabetes, likely deals with moderate cognitive deficit.    -She we will continue to require intensive treatment with basal/bolus insulin in order for her to achieve control of diabetes to target.    -She has questionable compliance and cognitive ability, her treatment will be adjusted slowly.   She has now been taking her Levemir properly. -She did not bring her leftover Levemir/Humalog, claims to have enough for 2 to 3 weeks. -She is advised to continue Levemir 60 units twice daily-  continue Humalog  20   units 3 times daily  before meals for pre-meal BG readings of 90 -'150mg'$ /dl, plus patient specific correction dose for unexpected hyperglycemia above '150mg'$ /dl, associated with strict monitoring of glucose 4 times a day-before meals and at bedtime.  -She will be considered for insulin U500 on subsequent visits for simplicity reasons, she will return in 2 weeks with her leftover Levemir and Humalog as well as her meter and logs. - Patient is warned not to take insulin without proper monitoring per orders.  -Patient is encouraged to call clinic for blood glucose levels less than 70 or above 300 mg /dl.  She did not tolerate retrial of low-dose metformin.  She is advised to discontinue.    -She is benefiting from low-dose glipizide. She is advised to continue glipizide 5 mg p.o. daily at breakfast.    -She is not a reliable candidate for incretin therapy.    2) Lipids/HPL: She has severely uncontrolled hypertriglyceridemia, LDL not calculated.  She is advised to continue pravastatin 40 mg p.o. nightly, advised to eliminate fried food and butter from her diet.  She is also on fenofibrate 145 mg p.o. nightly.   3) hypertension: -Her blood pressure is not controlled to target. She is advised to continue follow-up lisinopril/hydrochlorothiazide 20/12.5 mg daily.   She is advised to continue follow-up closely with Dr. Manuella Ghazi for primary care needs.   - Time spent on this patient care encounter:  35 min, of which > 50% was spent in  counseling and the rest reviewing her blood glucose logs , discussing her hypoglycemia and hyperglycemia episodes, reviewing her current and  previous labs / studies  ( including abstraction from other facilities) and medications  doses and developing a  long term treatment plan and documenting her care.   Please refer to Patient Instructions for Blood Glucose Monitoring and Insulin/Medications Dosing Guide"  in media tab for additional information. Please  also refer to "  Patient Self Inventory" in the Media  tab for reviewed elements of pertinent patient history.  Orlena Sheldon participated in the discussions, expressed understanding, and  voiced agreement with the above plans.  All questions were answered to her satisfaction. she is encouraged to contact clinic should she have any questions or concerns prior to her return visit.    Follow up plan: -Return in 3 months with labs, logs, and meter. Glade Lloyd, MD Phone: [phone]  Fax: [phone]  -  This note was partially dictated with voice recognition software. Similar sounding words can be transcribed inadequately or may not  be corrected upon review.  10/16/2018, 1:34 PM

## 2020-02-06 NOTE — Patient Instructions (Signed)
And bring all leftover insulin with you next visit.                                                                              Advice for Weight Management  -For most of [place] the best way to lose weight is by diet management. Generally speaking, diet management means consuming less calories intentionally which over time brings about progressive weight loss.  This can be achieved more effectively by restricting carbohydrate consumption to the minimum possible.  So, it is critically important to know your numbers: how much calorie you are consuming and how much calorie you need. More importantly, our carbohydrates sources should be unprocessed or minimally processed complex starch food items.   Sometimes, it is important to balance nutrition by increasing protein intake (animal or plant source), fruits, and vegetables.  -Sticking to a routine mealtime to eat 3 meals a day and avoiding unnecessary snacks is shown to have a big role in weight control. Under normal circumstances, the only time we lose real weight is when we are hungry, so allow hunger to take place- hunger means no food between meal times, only water.  It is not advisable to starve.   -It is better to avoid simple carbohydrates including: Cakes, Sweet Desserts, Ice Cream, Soda (diet and regular), Sweet Tea, Candies, Chips, Cookies, Store Bought Juices, Alcohol in Excess of  1-2 drinks a day, Artificial Sweeteners, Doughnuts, Coffee Creamers, "Sugar-free" Products, etc, etc.  This is not a complete list...Marland Kitchen.    -Consulting with certified diabetes educators is proven to provide you with the most accurate and current information on diet.  Also, you may be  interested in discussing diet options/exchanges , we can schedule a visit with Jearld Fenton, RDN, CDE for individualized nutrition education.  -Exercise: If you are able: 30 -60 minutes a day ,4 days a week, or 150 minutes a week.  The longer the better.  Combine stretch, strength, and  aerobic activities.  If you were told in the past that you have high risk for cardiovascular diseases, you may seek evaluation by your heart doctor prior to initiating moderate to intense exercise programs.                                  Additional Care Considerations for Diabetes   -Diabetes  is a chronic disease.  The most important care consideration is regular follow-up with your diabetes care provider with the goal being avoiding or delaying its complications and to take advantage of advances in medications and technology.    -Type 2 diabetes is known to coexist with other important comorbidities such as high blood pressure and high cholesterol.  It is critical to control not only the diabetes but also the high blood pressure and high cholesterol to minimize and delay the risk of complications including coronary artery disease, stroke, amputations, blindness, etc.    - Studies showed that people with diabetes will benefit from a class of medications known as ACE inhibitors and statins.  Unless there are specific reasons not to be on these medications, the standard of care is to consider  getting one from these groups of medications at an optimal doses.  These medications are generally considered safe and proven to help protect the heart and the kidneys.    - People with diabetes are encouraged to initiate and maintain regular follow-up with eye doctors, foot doctors, dentists , and if necessary heart and kidney doctors.     - It is highly recommended that people with diabetes quit smoking or stay away from smoking, and get yearly  flu vaccine and pneumonia vaccine at least every 5 years.  One other important lifestyle recommendation is to ensure adequate sleep - at least 6-7 hours of uninterrupted sleep at night.  -Exercise: If you are able: 30 -60 minutes a day, 4 days a week, or 150 minutes a week.  The longer the better.  Combine stretch, strength, and aerobic activities.  If you were told  in the past that you have high risk for cardiovascular diseases, you may seek evaluation by your heart doctor prior to initiating moderate to intense exercise programs.

## 2020-02-17 ENCOUNTER — Other Ambulatory Visit: Payer: Self-pay | Admitting: "Endocrinology

## 2020-02-17 ENCOUNTER — Telehealth: Payer: Self-pay | Admitting: "Endocrinology

## 2020-02-17 MED ORDER — ONETOUCH VERIO W/DEVICE KIT: 1.0000 | PACK | Freq: Four times a day (QID) | 0 refills | Status: AC

## 2020-02-17 NOTE — Telephone Encounter (Signed)
Rx sent 

## 2020-02-17 NOTE — Telephone Encounter (Signed)
Pt would like a new meter sent in, the one she has, is broken. [facility]

## 2020-02-18 DIAGNOSIS — I10 Essential (primary) hypertension: Secondary | ICD-10-CM | POA: Diagnosis not present

## 2020-02-18 DIAGNOSIS — M109 Gout, unspecified: Secondary | ICD-10-CM | POA: Diagnosis not present

## 2020-02-18 DIAGNOSIS — Z87891 Personal history of nicotine dependence: Secondary | ICD-10-CM | POA: Diagnosis not present

## 2020-02-18 DIAGNOSIS — Z299 Encounter for prophylactic measures, unspecified: Secondary | ICD-10-CM | POA: Diagnosis not present

## 2020-02-25 DIAGNOSIS — E7800 Pure hypercholesterolemia, unspecified: Secondary | ICD-10-CM | POA: Diagnosis not present

## 2020-02-25 DIAGNOSIS — E119 Type 2 diabetes mellitus without complications: Secondary | ICD-10-CM | POA: Diagnosis not present

## 2020-02-25 DIAGNOSIS — I10 Essential (primary) hypertension: Secondary | ICD-10-CM | POA: Diagnosis not present

## 2020-02-26 ENCOUNTER — Ambulatory Visit: Payer: Medicare Other | Admitting: Nurse Practitioner

## 2020-03-03 ENCOUNTER — Telehealth: Payer: Self-pay | Admitting: "Endocrinology

## 2020-03-03 ENCOUNTER — Other Ambulatory Visit: Payer: Self-pay | Admitting: "Endocrinology

## 2020-03-03 ENCOUNTER — Ambulatory Visit: Payer: Medicare Other | Admitting: Nurse Practitioner

## 2020-03-03 MED ORDER — HUMULIN R U-500 KWIKPEN 500 UNIT/ML ~~LOC~~ SOPN
50.0000 [IU] | PEN_INJECTOR | Freq: Three times a day (TID) | SUBCUTANEOUS | 2 refills | Status: DC
Start: 2020-03-03 — End: 2020-10-27

## 2020-03-03 NOTE — Telephone Encounter (Signed)
Advised patient of instructions, she wanted them mailed to her as well, letter was sent.

## 2020-03-03 NOTE — Telephone Encounter (Signed)
She can not be taking insulin without proper monitoring of BG. I will send in a rx for Humulin U500. She can start  U500 50 units only 3 times a day with breakfast, lunch, and supper separated by at least 4 hours from each other. No insulin at bed time. Advise her to stop Levemir and Humalog.

## 2020-03-03 NOTE — Telephone Encounter (Signed)
I returned call to patient, she doesn't have any current glucose readings for August, stated that she just hasn't been doing it like she should. For July the following are the readings she gave,  7/22 168 am supper 172, hs 156, 7/23 342 am,181 lunch,150 supper,120 hs,7/24 241 am,219 l,271 s,199 hs, 7/25 321 am,281 l,286 s,189 hs, 7/26 380 am,299 L,221 s,288 hs, 7/27 229 am,261 l,301 s,189 hs, 7/28 270 am,203 l,368 s,355 hs, 7/29 276 am,261 l ,237 s,354 hs, 7/30 336 am,301 l,301 s,289 hs.Please advise

## 2020-03-03 NOTE — Telephone Encounter (Signed)
Patient is calling and states that her son has covid and she has been around him therefore her appt today was canceled. She states her insulin was supposed to be changed at today visit and would like to speak to nurse.

## 2020-03-04 DIAGNOSIS — Z299 Encounter for prophylactic measures, unspecified: Secondary | ICD-10-CM | POA: Diagnosis not present

## 2020-03-04 DIAGNOSIS — E1165 Type 2 diabetes mellitus with hyperglycemia: Secondary | ICD-10-CM | POA: Diagnosis not present

## 2020-03-04 DIAGNOSIS — I10 Essential (primary) hypertension: Secondary | ICD-10-CM | POA: Diagnosis not present

## 2020-03-04 DIAGNOSIS — E1143 Type 2 diabetes mellitus with diabetic autonomic (poly)neuropathy: Secondary | ICD-10-CM | POA: Diagnosis not present

## 2020-03-04 DIAGNOSIS — E1142 Type 2 diabetes mellitus with diabetic polyneuropathy: Secondary | ICD-10-CM | POA: Diagnosis not present

## 2020-03-04 DIAGNOSIS — M109 Gout, unspecified: Secondary | ICD-10-CM | POA: Diagnosis not present

## 2020-03-11 ENCOUNTER — Other Ambulatory Visit: Payer: Self-pay | Admitting: "Endocrinology

## 2020-03-13 ENCOUNTER — Other Ambulatory Visit: Payer: Self-pay | Admitting: Nurse Practitioner

## 2020-03-13 DIAGNOSIS — R110 Nausea: Secondary | ICD-10-CM

## 2020-03-13 DIAGNOSIS — R1011 Right upper quadrant pain: Secondary | ICD-10-CM

## 2020-03-13 DIAGNOSIS — R140 Abdominal distension (gaseous): Secondary | ICD-10-CM

## 2020-03-13 DIAGNOSIS — K5900 Constipation, unspecified: Secondary | ICD-10-CM

## 2020-03-13 DIAGNOSIS — R1013 Epigastric pain: Secondary | ICD-10-CM

## 2020-03-13 DIAGNOSIS — R112 Nausea with vomiting, unspecified: Secondary | ICD-10-CM

## 2020-04-19 NOTE — Progress Notes (Deleted)
Referring Provider: Monico Blitz, MD Primary Care Physician:  Monico Blitz, MD Primary GI Physician: Dr. Gala Romney  No chief complaint on file.   HPI:   Carrie Turner is a 69 y.o. female presenting today with a history of history of GERD, constipation, nausea, epigastric pain. Linzess previously caused loose stools and incontinence, also tried Amitiza 8 mcg twice daily in the past. Most recently on Motegrity for constipation. Colonoscopy up-to-date in 2019 with single tubular adenoma, internal hemorrhoids, random colon biopsies negative. Recommended 5-year surveillance. EGD at that time with erosive reflux esophagitis, empiric dilation of esophagus due to dysphagia, hiatal hernia, small bowel biopsies negative for celiac. History of poorly controlled diabetes which has been felt to be contributing to her nausea. GES completed on 08/27/2019 revealed borderline delayed gastric emptying. 87% emptying at 4 hours (normal greater than 90%). RUQ [place] 10/03/19 without gallstones or wall thickening, CBD slightly prominent at 67m. LFTs within normal limits. HIDA in May 2021 within normal limits.  Last seen in our office in May 2021. She was eating about once a day. She was trying to eat small meals and avoid fried/fatty foods. Her weight was down 7 pounds in the last 1.5 months. She continues with intermittent epigastric pain occurring about 3 days a week worsened by fried/fatty foods. Stated she felt better with soft foods. No GERD symptoms on Dexilant. Continued with nausea every morning regardless of stomach ache taking Zofran about 3 days a week. Occasional vomiting which typically occurred in all the night (small amount). Denied eating within 3 hours of going to bed. Noted taking Carafate once which helped her feel better when she woke up with nausea. She was also taking Motegrity twice daily with BMs 4 to 5 days a week. She was asking to see a surgeon as she felt her symptoms were coming from her  gallbladder. I suspected patient's symptoms were secondary to gastroparesis and discussed the possibility of Reglan. Patient declined and preferred seeing surgery to discuss cholecystectomy for which she had an appointment the following day. Her Carafate was refilled and she was advised to take this at least nightly but could also take with meals, counseled on gastroparesis diet, Zofran as needed, continue Dexilant, decrease Motegrity to once daily and let me know if this does not control constipation well, counseled on importance of gaining better control of diabetes, recheck BMP, and follow-up in 4 months. Patient would call after seeing Dr. JArnoldo Moraleif she desires to try Reglan.  She underwent cholecystectomy 12/25/2019.  Telephone call 01/23/2020 with patient reporting upper abdominal pain x2 weeks. Pain primarily was in the mornings. No association with meals. No nausea, vomiting, or fever. She is taking Dexilant and Carafate. She is having normal BMs daily. After speaking with the patient, she stated she continue with the same upper GI symptoms she had been having all along with only mild improvement after cholecystectomy. Noted she took Carafate that morning which significantly improved her symptoms. She had stopped taking Carafate. She does admit to eating fried/fatty foods which worsen symptoms and eating late in the evenings. She was again counseled on the necessary dietary adjustments and advised to resume taking Carafate 3 times daily. She would call with progress report in 1-2 weeks. If no significant improvement, will try Reglan.  No progress report received.  Today:  Past Medical History:  Diagnosis Date   Anxiety    Arthritis    [place]    D[place] [place] 09/[place]/2019  per patient, early dementia diagnosed after seeing PCP recently   Depression    Diabetes mellitus, type II ([place])    GERD (gastroesophageal reflux disease)    Glaucoma     Hyperlipidemia    Hypertension    Migraines    Neuropathy    Seizures ([place])    seizure was from ETOH, "a long time ago", no med and no recurrance   Vitamin D deficiency     Past Surgical History:  Procedure Laterality Date   ABDOMINAL HYSTERECTOMY     CATARACT EXTRACTION W/PHACO Left 07/24/2019   Procedure: CATARACT EXTRACTION PHACO AND INTRAOCULAR LENS PLACEMENT LEFT EYE  (CDE: 5.60);  Surgeon: Baruch Goldmann, MD;  Location: [facility];  Service: Ophthalmology;  Laterality: Left;   CATARACT EXTRACTION W/PHACO Right 08/19/2019   Procedure: CATARACT EXTRACTION PHACO AND INTRAOCULAR LENS PLACEMENT RIGHT EYE;  Surgeon: Baruch Goldmann, MD;  Location: [facility];  Service: Ophthalmology;  Laterality: Right;  CDE: 8.83   CHOLECYSTECTOMY N/A 12/25/2019   Procedure: LAPAROSCOPIC CHOLECYSTECTOMY;  Surgeon: Aviva Signs, MD;  Location: [facility];  Service: General;  Laterality: N/A;   COLONOSCOPY  11/2015   Dr. Britta Mccreedy: sessile polyp removed (benign). advised repeat colonoscopy in 5 years.    COLONOSCOPY WITH PROPOFOL N/A 10/26/2017   RMR: Tubular adenoma removed.  Random colon biopsies negative.  Nonbleeding internal hemorrhoids.  Next colonoscopy in 5 years.   ESOPHAGEAL DILATION  10/26/2017   Procedure: ESOPHAGEAL DILATION;  Surgeon: Daneil Dolin, MD;  [facility]: [facility];  Service: Endoscopy;;   ESOPHAGOGASTRODUODENOSCOPY  11/2015   Dr. Britta Mccreedy: hiatal hernia   ESOPHAGOGASTRODUODENOSCOPY (EGD) WITH PROPOFOL N/A 10/26/2017   RMR: Erosive reflux esophagitis, hiatal hernia.  Small bowel biopsies negative.   INSERTION OF ANTERIOR SEGMENT AQUEOUS DRAINAGE DEVICE (ISTENT) Left 07/24/2019   Procedure: INSERTION OF ANTERIOR SEGMENT AQUEOUS DRAINAGE DEVICE (ISTENT) LEFT EYE;  Surgeon: Baruch Goldmann, MD;  Location: [facility];  Service: Ophthalmology;  Laterality: Left;   INSERTION OF ANTERIOR SEGMENT AQUEOUS DRAINAGE DEVICE (ISTENT) Right 08/19/2019   Procedure: INSERTION OF ANTERIOR SEGMENT AQUEOUS  DRAINAGE DEVICE (ISTENT) RIGHT EYE;  Surgeon: Baruch Goldmann, MD;  Location: [facility];  Service: Ophthalmology;  Laterality: Right;   KNEE ARTHROSCOPY Right    ORIF ANKLE FRACTURE Right     Current Outpatient Medications  Medication Sig Dispense Refill   albuterol (VENTOLIN HFA) 108 (90 Base) MCG/ACT inhaler Inhale 1-2 puffs into the lungs every 6 (six) hours as needed for wheezing or shortness of breath.     amLODipine (NORVASC) 5 MG tablet Take 5 mg by mouth daily.      aspirin EC 81 MG tablet Take 81 mg by mouth daily.     Blood Glucose Monitoring Suppl (ONETOUCH VERIO) w/Device KIT 1 each by Does not apply route 4 (four) times daily. E11.65 1 kit 0   Blood Glucose Monitoring Suppl (ONETOUCH VERIO) w/Device KIT 1 each by Does not apply route in the morning, at noon, in the evening, and at bedtime. 1 kit 0   Cholecalciferol (VITAMIN D3) 2000 units TABS Take 2,000 Units by mouth daily.     citalopram (CELEXA) 40 MG tablet Take 40 mg by mouth daily.     Continuous Blood Gluc Sensor (FREESTYLE LIBRE 14 DAY SENSOR) MISC Inject 1 each into the skin every 14 (fourteen) days. Use as directed. 2 each 2   DEXILANT 60 MG capsule TAKE 1 CAPSULE BY MOUTH ONCE DAILY. 90 capsule 3   donepezil (ARICEPT) 5 MG tablet Take  5 mg by mouth at bedtime.     eszopiclone (LUNESTA) 2 MG TABS tablet Take 2 mg by mouth at bedtime as needed for sleep. Take immediately before bedtime     fenofibrate (TRICOR) 145 MG tablet Take 1 tablet (145 mg total) by mouth daily. 90 tablet 0   Insulin Pen Needle (PEN NEEDLES) 31G X 6 MM MISC 1 each by Does not apply route 4 (four) times daily. 400 each 1   insulin regular human CONCENTRATED (HUMULIN R U-500 KWIKPEN) 500 UNIT/ML kwikpen Inject 50 Units into the skin 3 (three) times daily with meals. 12 mL 2   lisinopril-hydrochlorothiazide (PRINZIDE,ZESTORETIC) 20-12.5 MG tablet Take 2 tablets by mouth daily.      metoCLOPramide (REGLAN) 5 MG tablet Take 5 mg by mouth 3  (three) times daily.     MOTEGRITY 2 MG TABS TAKE 1 TABLET ONCE DAILY. 30 tablet 5   ondansetron (ZOFRAN) 8 MG tablet Take 1 tablet (8 mg total) by mouth every 8 (eight) hours as needed for nausea or vomiting. 30 tablet 0   ONETOUCH VERIO test strip USE 1 STRIP TO CHECK GLUCOSE 4 TIMES DAILY. 200 strip 3   pravastatin (PRAVACHOL) 40 MG tablet Take 40 mg by mouth daily.     pyridOXINE (VITAMIN B-6) 100 MG tablet Take 100 mg by mouth daily.     vitamin B-12 (CYANOCOBALAMIN) 500 MCG tablet Take 500 mcg by mouth daily.     No current facility-administered medications for this visit.    Allergies as of 04/20/2020 - Review Complete 02/06/2020  Allergen Reaction Noted   Sulfa antibiotics Other (See Comments) 03/04/2016    Family History  Problem Relation Age of Onset   Hypertension Mother    Colon cancer Mother        diagnosed early 50s   Cerebral aneurysm Mother    Alcohol abuse Father    Pancreatic cancer Sister        deceased in 43s   Colon cancer Sister        diagnosed 13 y/o   Stroke Brother    Cancer Maternal Grandmother    Cancer Maternal Grandfather    Cancer Paternal Grandmother    Other Paternal Grandfather    Other Brother     Social History   Socioeconomic History   Marital status: Single    Spouse name: Not on file   Number of children: Not on file   Years of education: Not on file   Highest education level: Not on file  Occupational History   Not on file  Tobacco Use   Smoking status: Never Smoker   Smokeless tobacco: Never Used  Scientific laboratory technician Use: Never used  Substance and Sexual Activity   Alcohol use: No   Drug use: No   Sexual activity: Never  Other Topics Concern   Not on file  Social History Narrative   Not on file   Social Determinants of Health   Financial Resource Strain:    Difficulty of Paying Living Expenses: Not on file  Food Insecurity:    Worried About Charity fundraiser in the Last Year:  Not on file   [employer] of Food in the Last Year: Not on file  Transportation Needs:    Lack of Transportation (Medical): Not on file   Lack of Transportation (Non-Medical): Not on file  Physical Activity:    Days of Exercise per Week: Not on file   Minutes of Exercise per  Session: Not on file  Stress:    Feeling of Stress : Not on file  Social Connections:    Frequency of Communication with Friends and Family: Not on file   Frequency of Social Gatherings with Friends and Family: Not on file   Attends Religious Services: Not on file   Active Member of Clubs or Organizations: Not on file   Attends Archivist Meetings: Not on file   Marital Status: Not on file    Review of Systems: Gen: Denies fever, chills, anorexia. Denies fatigue, weakness, weight loss.  CV: Denies chest pain, palpitations, syncope, peripheral edema, and claudication. Resp: Denies dyspnea at rest, cough, wheezing, coughing up blood, and pleurisy. GI: Denies vomiting blood, jaundice, and fecal incontinence.   Denies dysphagia or odynophagia. Derm: Denies rash, itching, dry skin Psych: Denies depression, anxiety, memory loss, confusion. No homicidal or suicidal ideation.  Heme: Denies bruising, bleeding, and enlarged lymph nodes.  Physical Exam: There were no vitals taken for this visit. General:   Alert and oriented. No distress noted. Pleasant and cooperative.  Head:  Normocephalic and atraumatic. Eyes:  Conjuctiva clear without scleral icterus. Mouth:  Oral mucosa pink and moist. Good dentition. No lesions. Heart:  S1, S2 present without murmurs appreciated. Lungs:  Clear to auscultation bilaterally. No wheezes, rales, or rhonchi. No distress.  Abdomen:  +BS, soft, non-tender and non-distended. No rebound or guarding. No HSM or masses noted. Msk:  Symmetrical without gross deformities. Normal posture. Extremities:  Without edema. Neurologic:  Alert and  oriented x4 Psych:  Alert and  cooperative. Normal mood and affect.

## 2020-04-20 ENCOUNTER — Ambulatory Visit: Payer: Medicare HMO | Admitting: Gastroenterology

## 2020-04-23 DIAGNOSIS — E7800 Pure hypercholesterolemia, unspecified: Secondary | ICD-10-CM | POA: Diagnosis not present

## 2020-04-23 DIAGNOSIS — I10 Essential (primary) hypertension: Secondary | ICD-10-CM | POA: Diagnosis not present

## 2020-04-23 DIAGNOSIS — E119 Type 2 diabetes mellitus without complications: Secondary | ICD-10-CM | POA: Diagnosis not present

## 2020-04-28 DIAGNOSIS — E1142 Type 2 diabetes mellitus with diabetic polyneuropathy: Secondary | ICD-10-CM | POA: Diagnosis not present

## 2020-04-28 DIAGNOSIS — J449 Chronic obstructive pulmonary disease, unspecified: Secondary | ICD-10-CM | POA: Diagnosis not present

## 2020-04-28 DIAGNOSIS — Z299 Encounter for prophylactic measures, unspecified: Secondary | ICD-10-CM | POA: Diagnosis not present

## 2020-04-28 DIAGNOSIS — I10 Essential (primary) hypertension: Secondary | ICD-10-CM | POA: Diagnosis not present

## 2020-04-28 DIAGNOSIS — E1165 Type 2 diabetes mellitus with hyperglycemia: Secondary | ICD-10-CM | POA: Diagnosis not present

## 2020-05-04 ENCOUNTER — Other Ambulatory Visit: Payer: Self-pay | Admitting: "Endocrinology

## 2020-05-07 ENCOUNTER — Ambulatory Visit (INDEPENDENT_AMBULATORY_CARE_PROVIDER_SITE_OTHER): Payer: Medicare Other | Admitting: Nurse Practitioner

## 2020-05-07 ENCOUNTER — Encounter: Payer: Self-pay | Admitting: Nurse Practitioner

## 2020-05-07 ENCOUNTER — Other Ambulatory Visit: Payer: Self-pay

## 2020-05-07 VITALS — BP 155/86 | HR 89 | Ht 62.0 in | Wt 197.2 lb

## 2020-05-07 DIAGNOSIS — Z9119 Patient's noncompliance with other medical treatment and regimen: Secondary | ICD-10-CM | POA: Diagnosis not present

## 2020-05-07 DIAGNOSIS — E1165 Type 2 diabetes mellitus with hyperglycemia: Secondary | ICD-10-CM | POA: Diagnosis not present

## 2020-05-07 DIAGNOSIS — IMO0002 Reserved for concepts with insufficient information to code with codable children: Secondary | ICD-10-CM

## 2020-05-07 DIAGNOSIS — E782 Mixed hyperlipidemia: Secondary | ICD-10-CM

## 2020-05-07 DIAGNOSIS — Z794 Long term (current) use of insulin: Secondary | ICD-10-CM

## 2020-05-07 DIAGNOSIS — E118 Type 2 diabetes mellitus with unspecified complications: Secondary | ICD-10-CM | POA: Diagnosis not present

## 2020-05-07 DIAGNOSIS — I10 Essential (primary) hypertension: Secondary | ICD-10-CM

## 2020-05-07 DIAGNOSIS — Z91199 Patient's noncompliance with other medical treatment and regimen due to unspecified reason: Secondary | ICD-10-CM

## 2020-05-07 LAB — POCT GLYCOSYLATED HEMOGLOBIN (HGB A1C): Hemoglobin A1C: 8.2 % — AB (ref 4.0–5.6)

## 2020-05-07 MED ORDER — FREESTYLE LIBRE 14 DAY SENSOR MISC
1.0000 | 6 refills | Status: DC
Start: 2020-05-07 — End: 2021-07-28

## 2020-05-07 MED ORDER — FREESTYLE LIBRE 14 DAY READER DEVI
1.0000 | Freq: Once | 0 refills | Status: AC
Start: 2020-05-07 — End: 2020-05-07

## 2020-05-07 NOTE — Patient Instructions (Signed)

## 2020-05-07 NOTE — Progress Notes (Signed)
05/07/2020  Endocrinology follow-up note  Subjective:    Patient ID: Carrie Turner, female    DOB: 1951-03-12.  She is being seen in follow-up  for management of diabetes requested by  Monico Blitz, MD  Past Medical History:  Diagnosis Date  . Anxiety   . Arthritis   . COPD (chronic obstructive pulmonary disease) ([place])   . Dementia ([place]) 09/30/2019   per patient, early dementia diagnosed after seeing PCP recently  . Depression   . Diabetes mellitus, type II ([place])   . GERD (gastroesophageal reflux disease)   . Glaucoma   . Hyperlipidemia   . Hypertension   . Migraines   . Neuropathy   . Seizures ([place])    seizure was from ETOH, "a long time ago", no med and no recurrance  . Vitamin D deficiency     Past Surgical History:  Procedure Laterality Date  . ABDOMINAL HYSTERECTOMY    . CATARACT EXTRACTION W/PHACO Left 07/24/2019   Procedure: CATARACT EXTRACTION PHACO AND INTRAOCULAR LENS PLACEMENT LEFT EYE  (CDE: 5.60);  Surgeon: Baruch Goldmann, MD;  Location: [facility];  Service: Ophthalmology;  Laterality: Left;  . CATARACT EXTRACTION W/PHACO Right 08/19/2019   Procedure: CATARACT EXTRACTION PHACO AND INTRAOCULAR LENS PLACEMENT RIGHT EYE;  Surgeon: Baruch Goldmann, MD;  Location: [facility];  Service: Ophthalmology;  Laterality: Right;  CDE: 8.83  . CHOLECYSTECTOMY N/A 12/25/2019   Procedure: LAPAROSCOPIC CHOLECYSTECTOMY;  Surgeon: Aviva Signs, MD;  Location: [facility];  Service: General;  Laterality: N/A;  . COLONOSCOPY  11/2015   Dr. Britta Mccreedy: sessile polyp removed (benign). advised repeat colonoscopy in 5 years.   . COLONOSCOPY WITH PROPOFOL N/A 10/26/2017   RMR: Tubular adenoma removed.  Random colon biopsies negative.  Nonbleeding internal hemorrhoids.  Next colonoscopy in 5 years.  . ESOPHAGEAL DILATION  10/26/2017   Procedure: ESOPHAGEAL DILATION;  Surgeon: Daneil Dolin, MD;  Location: [facility];  Service: Endoscopy;;  . ESOPHAGOGASTRODUODENOSCOPY  11/2015   Dr. Britta Mccreedy: hiatal  hernia  . ESOPHAGOGASTRODUODENOSCOPY (EGD) WITH PROPOFOL N/A 10/26/2017   RMR: Erosive reflux esophagitis, hiatal hernia.  Small bowel biopsies negative.  . INSERTION OF ANTERIOR SEGMENT AQUEOUS DRAINAGE DEVICE (ISTENT) Left 07/24/2019   Procedure: INSERTION OF ANTERIOR SEGMENT AQUEOUS DRAINAGE DEVICE (ISTENT) LEFT EYE;  Surgeon: Baruch Goldmann, MD;  Location: [facility];  Service: Ophthalmology;  Laterality: Left;  . INSERTION OF ANTERIOR SEGMENT AQUEOUS DRAINAGE DEVICE (ISTENT) Right 08/19/2019   Procedure: INSERTION OF ANTERIOR SEGMENT AQUEOUS DRAINAGE DEVICE (ISTENT) RIGHT EYE;  Surgeon: Baruch Goldmann, MD;  Location: [facility];  Service: Ophthalmology;  Laterality: Right;  . KNEE ARTHROSCOPY Right   . ORIF ANKLE FRACTURE Right      Social History   Socioeconomic History  . Marital status: Single    Spouse name: Not on file  . Number of children: Not on file  . Years of education: Not on file  . Highest education level: Not on file  Occupational History  . Not on file  Tobacco Use  . Smoking status: Never Smoker  . Smokeless tobacco: Never Used  Vaping Use  . Vaping Use: Never used  Substance and Sexual Activity  . Alcohol use: No  . Drug use: No  . Sexual activity: Never  Other Topics Concern  . Not on file  Social History Narrative  . Not on file   Social Determinants of Health   Financial Resource Strain:   . Difficulty of Paying Living Expenses: Not on file  Food Insecurity:   .  Worried About Charity fundraiser in the Last Year: Not on file  . Ran Out of Food in the Last Year: Not on file  Transportation Needs:   . Lack of Transportation (Medical): Not on file  . Lack of Transportation (Non-Medical): Not on file  Physical Activity:   . Days of Exercise per Week: Not on file  . Minutes of Exercise per Session: Not on file  Stress:   . Feeling of Stress : Not on file  Social Connections:   . Frequency of Communication with Friends and Family: Not on file  . Frequency  of Social Gatherings with Friends and Family: Not on file  . Attends Religious Services: Not on file  . Active Member of Clubs or Organizations: Not on file  . Attends Archivist Meetings: Not on file  . Marital Status: Not on file  Intimate Partner Violence:   . Fear of Current or Ex-Partner: Not on file  . Emotionally Abused: Not on file  . Physically Abused: Not on file  . Sexually Abused: Not on file    Current Outpatient Medications on File Prior to Visit  Medication Sig Dispense Refill  . albuterol (VENTOLIN HFA) 108 (90 Base) MCG/ACT inhaler Inhale 1-2 puffs into the lungs every 6 (six) hours as needed for wheezing or shortness of breath.    Marland Kitchen amLODipine (NORVASC) 5 MG tablet Take 5 mg by mouth daily.     Marland Kitchen aspirin EC 81 MG tablet Take 81 mg by mouth daily.    . Blood Glucose Monitoring Suppl (ONETOUCH VERIO) w/Device KIT 1 each by Does not apply route in the morning, at noon, in the evening, and at bedtime. 1 kit 0  . Cholecalciferol (VITAMIN D3) 2000 units TABS Take 2,000 Units by mouth daily.    . citalopram (CELEXA) 40 MG tablet Take 40 mg by mouth daily.    Marland Kitchen DEXILANT 60 MG capsule TAKE 1 CAPSULE BY MOUTH ONCE DAILY. 90 capsule 3  . donepezil (ARICEPT) 5 MG tablet Take 5 mg by mouth at bedtime.    . eszopiclone (LUNESTA) 2 MG TABS tablet Take 2 mg by mouth at bedtime as needed for sleep. Take immediately before bedtime    . fenofibrate (TRICOR) 145 MG tablet Take 1 tablet (145 mg total) by mouth daily. 90 tablet 0  . gabapentin (NEURONTIN) 800 MG tablet Take 800 mg by mouth 3 (three) times daily.    Marland Kitchen glipiZIDE (GLUCOTROL XL) 5 MG 24 hr tablet Take 5 mg by mouth daily.    . Insulin Pen Needle (PEN NEEDLES) 31G X 6 MM MISC 1 each by Does not apply route 4 (four) times daily. 400 each 1  . insulin regular human CONCENTRATED (HUMULIN R U-500 KWIKPEN) 500 UNIT/ML kwikpen Inject 50 Units into the skin 3 (three) times daily with meals. 12 mL 2  .  lisinopril-hydrochlorothiazide (PRINZIDE,ZESTORETIC) 20-12.5 MG tablet Take 2 tablets by mouth daily.     . metoCLOPramide (REGLAN) 5 MG tablet Take 5 mg by mouth 3 (three) times daily.    Marland Kitchen MOTEGRITY 2 MG TABS TAKE 1 TABLET ONCE DAILY. 30 tablet 5  . ondansetron (ZOFRAN) 8 MG tablet Take 1 tablet (8 mg total) by mouth every 8 (eight) hours as needed for nausea or vomiting. 30 tablet 0  . ONETOUCH VERIO test strip USE 1 STRIP TO CHECK GLUCOSE 4 TIMES DAILY. 200 strip 3  . pravastatin (PRAVACHOL) 40 MG tablet Take 40 mg by mouth daily.    Marland Kitchen  PREMARIN vaginal cream SMARTSIG:1 Vaginal Every Night    . pyridOXINE (VITAMIN B-6) 100 MG tablet Take 100 mg by mouth daily.    . temazepam (RESTORIL) 15 MG capsule Take 15 mg by mouth at bedtime as needed.    . vitamin B-12 (CYANOCOBALAMIN) 500 MCG tablet Take 500 mcg by mouth daily.     No current facility-administered medications on file prior to visit.    No facility-administered encounter medications on file as of 10/16/2018.    Allergies  Allergen Reactions  . Sulfa Antibiotics Other (See Comments)    Weakness and fatigued      VACCINATION STATUS:  There is no immunization history on file for this patient. Diabetes She presents for her follow-up diabetic visit. She has type 2 diabetes mellitus. Onset time: diagnosed at approximate age of 80. Her disease course has been improving. Hypoglycemia symptoms include nervousness/anxiousness, sweats and tremors. Pertinent negatives for hypoglycemia include no confusion, headaches, mood changes or seizures. Associated symptoms include fatigue, polydipsia, polyphagia and polyuria. Pertinent negatives for diabetes include no chest pain. There are no hypoglycemic complications. Symptoms are improving. Diabetic complications include peripheral neuropathy. Risk factors for coronary artery disease include diabetes mellitus, dyslipidemia, hypertension, obesity, post-menopausal and sedentary lifestyle. Current  diabetic treatment includes intensive insulin program and oral agent (monotherapy). She is compliant with treatment some of the time. Her weight is increasing steadily. She is following a generally unhealthy diet. When asked about meal planning, she reported none. She has had a previous visit with a dietitian. She rarely participates in exercise. Her home blood glucose trend is decreasing steadily. Her breakfast blood glucose range is generally >200 mg/dl. Her lunch blood glucose range is generally >200 mg/dl. Her dinner blood glucose range is generally >200 mg/dl. Her bedtime blood glucose range is generally >200 mg/dl. Her overall blood glucose range is >200 mg/dl. (She presents today with her meter and logs showing improving yet still above target glycemic profile overall.  Her POCT A1C today is 8.2%, improving from last visit of 9.9%.  She reports she often misses her shots due to not eating because of her gastroparesis.  She reports mild episodes of hypoglycemia, requiring glucose tablets, but upon further investigation, she would treat herself when she had symptoms and glucose was in the 80s.) An ACE inhibitor/angiotensin II receptor blocker is being taken. She does not see a podiatrist.Eye exam is current.  Hyperlipidemia This is a chronic problem. The current episode started more than 1 year ago. The problem is resistant. Recent lipid tests were reviewed and are high. Exacerbating diseases include diabetes and obesity. Factors aggravating her hyperlipidemia include thiazides. Pertinent negatives include no chest pain, leg pain, myalgias or shortness of breath. Current antihyperlipidemic treatment includes fibric acid derivatives and statins. The current treatment provides mild improvement of lipids. Compliance problems include adherence to exercise, adherence to diet and psychosocial issues.  Risk factors for coronary artery disease include diabetes mellitus, dyslipidemia, hypertension, obesity,  post-menopausal and a sedentary lifestyle.  Hypertension This is a chronic problem. The current episode started more than 1 year ago. The problem has been gradually improving since onset. The problem is controlled. Associated symptoms include sweats. Pertinent negatives include no chest pain, headaches or shortness of breath. There are no associated agents to hypertension. Risk factors for coronary artery disease include diabetes mellitus, sedentary lifestyle, obesity, dyslipidemia and post-menopausal state. Past treatments include calcium channel blockers and ACE inhibitors. The current treatment provides mild improvement. Compliance problems include diet, exercise and psychosocial  issues.     Review of systems  Constitutional: + Minimally fluctuating body weight,  current Body mass index is 36.07 kg/m. , no fatigue, no subjective hyperthermia, no subjective hypothermia Eyes: no blurry vision, no xerophthalmia ENT: no sore throat, no nodules palpated in throat, no dysphagia/odynophagia, no hoarseness Cardiovascular: no chest pain, no shortness of breath, no palpitations, no leg swelling Respiratory: no cough, no shortness of breath Gastrointestinal: mild intermittent nausea/vomiting/diarrhea associated with gastroparesis Musculoskeletal: no muscle/joint aches Skin: no rashes, no hyperemia Neurological: no tremors, no numbness, no tingling, no dizziness Psychiatric: no depression, no anxiety  Objective:    BP (!) 155/86 (BP Location: Right Arm, Patient Position: Sitting)   Pulse 89   Ht _0  (1.575 m)   Wt 197 lb 3.2 oz (89.4 kg)   BMI 36.07 kg/m   Wt Readings from Last 3 Encounters:  05/07/20 197 lb 3.2 oz (89.4 kg)  02/06/20 195 lb 9.6 oz (88.7 kg)  01/06/20 192 lb (87.1 kg)   BP Readings from Last 3 Encounters:  05/07/20 (!) 155/86  02/06/20 (!) 158/86  01/06/20 130/81     Physical Exam- Limited  Constitutional:  Body mass index is 36.07 kg/m. , not in acute distress,  normal state of mind, forgetful Eyes:  EOMI, no exophthalmos Neck: Supple Thyroid: No gross goiter Cardiovascular: RRR, no murmers, rubs, or gallops, no edema Respiratory: Adequate breathing efforts, no crackles, rales, rhonchi, or wheezing Musculoskeletal: no gross deformities, strength intact in all four extremities, no gross restriction of joint movements Skin:  no rashes, no hyperemia Neurological: no tremor with outstretched hands   Lipid Panel     Component Value Date/Time   CHOL 372 (A) 02/06/2019 0000   TRIG 668 (A) 02/06/2019 0000   HDL 56 02/06/2019 0000     Assessment & Plan:   1. Uncontrolled type 2 diabetes mellitus with complication, with long-term current use of insulin ([place])  - Patient has currently uncontrolled symptomatic type 2 DM since  69 years of age.  She presents today with her meter and logs showing improving yet still above target glycemic profile overall.  Her POCT A1C today is 8.2%, improving from last visit of 9.9%.  She reports she often misses her shots due to not eating because of her gastroparesis.  She reports mild episodes of hypoglycemia, requiring glucose tablets, but upon further investigation, she would treat herself when she had symptoms and glucose was in the 80s.   - Recent labs are reviewed.   Her diabetes is complicated by noncompliance/nonadherence, obesity and sedentary life and patient remains at a high risk for more acute and chronic complications of diabetes which include CAD, CVA, CKD, retinopathy, and neuropathy. These are all discussed in detail with the patient.  - Nutritional counseling repeated at each appointment due to patients tendency to fall back in to old habits.  - The patient admits there is a room for improvement in their diet and drink choices. -  Suggestion is made for the patient to avoid simple carbohydrates from their diet including Cakes, Sweet Desserts / Pastries, Ice Cream, Soda (diet and regular), Sweet Tea,  Candies, Chips, Cookies, Sweet Pastries,  Store Bought Juices, Alcohol in Excess of  1-2 drinks a day, Artificial Sweeteners, Coffee Creamer, and "Sugar-free" Products. This will help patient to have stable blood glucose profile and potentially avoid unintended weight gain.   - I encouraged the patient to switch to  unprocessed or minimally processed complex starch and increased protein intake (  animal or plant source), fruits, and vegetables.   - Patient is advised to stick to a routine mealtimes to eat 3 meals  a day and avoid unnecessary snacks ( to snack only to correct hypoglycemia).  - I have approached patient with the following individualized plan to manage diabetes and patient agrees:   -she is struggling to achieve control of diabetes, likely deals with moderate cognitive deficit. Avoiding hypoglycemia is the #1 priority in her case.   -She was started on Humulin U-500 on last visit, she brought her left over insulin to her appointment today.  -Given the improvement in her glycemic profile, she is advised to continue with her current dose of U-500 with 50 units TID with meals if glucose is above 90 and she is eating.  She is advised to also continue Glipizide 5 mg po daily with breakfast.  -She was educated not to treat for low blood glucose unless her blood sugar drops below 70 (to avoid her treating herself if she has symptoms of low glucose but blood sugar around 100 which she has been doing).  -She is encouraged to continue monitoring blood glucose at least 4 times per day, before meals and before bed, and call the clinic if she has readings less than 70 or greater than 300 for 3 tests in a row.  She would greatly benefit from CGM device.  It was prescribed for her on previous visit, but she never heard anything from her pharmacy to come get it.  Will re-prescribe Freestyle Libre for patient from [facility].  -She is not a reliable candidate for incretin therapy due to  her hypertriglyceridemia.    2) Lipids/HPL:  Her previous lipid panel from  02/06/2019 shows uncontrolled triglycerides of 668.  She is advised to continue Pravastatin 40 mg po daily at bedtime and continue Fenofibrate 145 mg po daily.    3) Hypertension: -Her blood pressure is near target for her age.  She is advised to continue Norvasc 5 mg po daily, Lisinopril-HCT 40-25 mg po daily.   She is advised to continue follow-up closely with Dr. Manuella Ghazi for primary care needs.   - Time spent on this patient care encounter:  38 min, of which > 50% was spent in  counseling and the rest reviewing her blood glucose logs , discussing her hypoglycemia and hyperglycemia episodes, reviewing her current and  previous labs / studies  ( including abstraction from other facilities) and medications  doses and developing a  long term treatment plan and documenting her care.   Please refer to Patient Instructions for Blood Glucose Monitoring and Insulin/Medications Dosing Guide"  in media tab for additional information. Please  also refer to " Patient Self Inventory" in the Media  tab for reviewed elements of pertinent patient history.  Carrie Turner participated in the discussions, expressed understanding, and voiced agreement with the above plans.  All questions were answered to her satisfaction. she is encouraged to contact clinic should she have any questions or concerns prior to her return visit.   Follow up plan: -Return in 3 months with labs, logs, and meter.   Carrie Turner, [facility] [address] [place], [place] [address] Phone: [phone] Fax: [phone]  10/16/2018, 1:34 PM

## 2020-05-22 DIAGNOSIS — Z299 Encounter for prophylactic measures, unspecified: Secondary | ICD-10-CM | POA: Diagnosis not present

## 2020-05-22 DIAGNOSIS — J449 Chronic obstructive pulmonary disease, unspecified: Secondary | ICD-10-CM | POA: Diagnosis not present

## 2020-05-22 DIAGNOSIS — Z789 Other specified health status: Secondary | ICD-10-CM | POA: Diagnosis not present

## 2020-05-22 DIAGNOSIS — E1143 Type 2 diabetes mellitus with diabetic autonomic (poly)neuropathy: Secondary | ICD-10-CM | POA: Diagnosis not present

## 2020-05-22 DIAGNOSIS — J441 Chronic obstructive pulmonary disease with (acute) exacerbation: Secondary | ICD-10-CM | POA: Diagnosis not present

## 2020-06-08 ENCOUNTER — Other Ambulatory Visit: Payer: Self-pay

## 2020-06-08 ENCOUNTER — Telehealth: Payer: Self-pay | Admitting: Internal Medicine

## 2020-06-08 DIAGNOSIS — R197 Diarrhea, unspecified: Secondary | ICD-10-CM

## 2020-06-08 NOTE — Telephone Encounter (Signed)
Spoke with pt. Pt states she's been having diarrhea since Friday 06/05/20. Pt states her stool has been watery and she has messed 15 depends up having diarrhea. Pt says on Friday she went to the bathroom q 10 mins and it has slowed up some. Pt has had 3 BM's today after taking an Imodium. Pt was asked if she is taking anything to help her bowels move and per pt she isn't. When asked if she is taking Motegrity 2 mg daily, pt said yes along with Dexilant and Zofran as needed for nausea. Pt isn't eating much due to having to have a BM after eating. Pt reports some abdominal pain that she can't describe. Pt says she is drinking water and Gingerale and feels a little week. Pt states she doesn't feel that she's going to pass out.

## 2020-06-08 NOTE — Telephone Encounter (Signed)
Routing to [employer]

## 2020-06-08 NOTE — Telephone Encounter (Signed)
Any recent antibiotics, hospitalizations, sick contacts?  Recommend she complete stool studies to rule out infectious diarrhea. Please arrange C. Diff [facility] and Toxin A/B and GI pathogen panel. Recommend she hold off on imodium until she completes stool studies as her stools will need to be watery for the sample. She should also hold Motegrity for now.   She should follow a bland diet, drink plenty of liquids to stay well-hydrated .  Recommend liquids containing electrolytes including Pedialyte or G2.  If she has any significant lightheadedness, dizziness, feeling like she may pass out, or significant weakness, she should proceed to the emergency room.

## 2020-06-08 NOTE — Telephone Encounter (Signed)
Patient has upcoming appointment and needs to know what to do about her diarrhea until then.

## 2020-06-08 NOTE — Telephone Encounter (Signed)
Spoke with pt. Pt hasn't taken any recent antibiotics, no hospitalizations, no sick contacts. Pt was advised to hold Imodium and hold Motegrity for now. Pt was advised to drink plenty of fluids, water Pedialyte or G2. Stool study orders placed for [facility] and faxed.

## 2020-06-08 NOTE — Telephone Encounter (Signed)
Carrie Turner, the C. diff ordered only states it is for C. diff toxin A/B. We also need C. diff [facility].

## 2020-06-09 ENCOUNTER — Other Ambulatory Visit: Payer: Self-pay

## 2020-06-09 NOTE — Telephone Encounter (Signed)
Ok, it may not be an option then. Thanks!

## 2020-06-09 NOTE — Progress Notes (Signed)
Opened in error

## 2020-06-09 NOTE — Telephone Encounter (Signed)
Noted. Orders were faxed to [facility].

## 2020-06-09 NOTE — Telephone Encounter (Signed)
I'm not able to find that test for [facility]. Pt will only go to [facility]. I can write orders out and fax them.

## 2020-06-13 NOTE — Progress Notes (Deleted)
Referring Provider: Monico Blitz, MD Primary Care Physician:  Monico Blitz, MD Primary GI Physician: Dr.   Rayne Du chief complaint on file.   HPI:   Carrie Turner is a 69 y.o. female presenting today for follow-up.  GI history of GERD, constipation, nausea, epigastric pain. Colonoscopy up-to-date in 2019 with single tubular adenoma, internal hemorrhoids, random colon biopsies negative. Recommended 5-year surveillance. EGD at that time with erosive reflux esophagitis, empiric dilation of esophagus with dysphagia, hiatal hernia, small bowel biopsies negative for celiac. History of poorly controlled diabetes which has been felt to be contributing to her nausea. GES completed on 08/27/2019 revealed borderline delayed gastric emptying. 87% emptying at 4 hours (normal greater than 90%). RUQ [place] 10/03/19 without gallstones or wall thickening, CBD slightly prominent at 53m. HIDA 11/26/2019 within normal limits. Historically Linzess caused loose stools and incontinence. Has also been on Amitiza 898m BID in the past. Currently on Motegrity for constipation. Zofran as needed for nausea.  Since last seen in our office 12/16/2019.  Reported intermittent epigastric abdominal pain about 3 days a week without identified triggers, but later reported fatty meals did worsen pain.  Reported doing better with soft foods.  GERD well controlled on Dexilant daily.  Nausea most every morning regardless of abdominal pain.  Taking Zofran 3 days a week.  Occasional small-volume nocturnal emesis.  Reported recently trying Carafate which helped her nausea.  BMs 4 to 5 days a week with Motegrity twice daily.  A1c was 9.2 in March.  She is convinced her symptoms are related to her gallbladder and requested seeing a surgeon although I explained I thought her symptoms may be related to gastroparesis and having cholecystectomy may not improve her symptoms.  Patient preferred to hold off on trial of Reglan and wanted to see Dr. JeArnoldo Morale  Plan to resume Carafate up to 3 times daily with meals and at bedtime, continue Dexilant, Zofran, counseled on gastroparesis diet, decrease Motegrity to once daily, counseled on importance of better diabetes control.  After patient left office, I noted potassium 3.9 in April, so BMP was added.  Potassium within normal limits 12/20/2019.  Notably, glucose was 304.  She underwent cholecystectomy 12/25/2019.  Patient called 01/23/2020 reporting upper abdominal pain x2 weeks.  Only had mild improvement after cholecystectomy.  Had taken Carafate that morning which significantly helped her abdominal pain.  She is not taking it as I had recommended at her last office visit.  Also noted eating fried/fatty foods intermittently heavy eating late in the evenings.  She was again counseled on gastroparesis diet and advised continue her current medications, call in 1 to 2 weeks, if no improvement, try Reglan.  Patient called 06/08/2020 reporting watery diarrhea starting 06/05/2020.  She was still taking Motegrity 2 mg daily.  Was taking Imodium for symptomatic relief.  She was not eating much due to diarrhea.  Recommended stool studies, hold off on Imodium until she completes these, hold Motegrity for now, follow a bland diet, and drink plenty of liquids that contain electrolytes.   No results have been received.  Today:   Past Medical History:  Diagnosis Date  . Anxiety   . Arthritis   . COPD (ch[place] disease) ([place]  . Dementia ([facility]/[place]   per patient, early dementia diagnosed after seeing PCP recently  . Depression   . Diabetes mellitus, type II ([facility] [place]  . GERD (gastroesophageal reflux disease)   . Glaucoma   . Hyperlipidemia   .  Hypertension   . Migraines   . Neuropathy   . Seizures ([place])    seizure was from ETOH, "a long time ago", no med and no recurrance  . Vitamin D deficiency     Past Surgical History:  Procedure Laterality Date  . ABDOMINAL HYSTERECTOMY    .  CATARACT EXTRACTION W/PHACO Left 07/24/2019   Procedure: CATARACT EXTRACTION PHACO AND INTRAOCULAR LENS PLACEMENT LEFT EYE  (CDE: 5.60);  Surgeon: Baruch Goldmann, MD;  Location: [facility];  Service: Ophthalmology;  Laterality: Left;  . CATARACT EXTRACTION W/PHACO Right 08/19/2019   Procedure: CATARACT EXTRACTION PHACO AND INTRAOCULAR LENS PLACEMENT RIGHT EYE;  Surgeon: Baruch Goldmann, MD;  Location: [facility];  Service: Ophthalmology;  Laterality: Right;  CDE: 8.83  . CHOLECYSTECTOMY N/A 12/25/2019   Procedure: LAPAROSCOPIC CHOLECYSTECTOMY;  Surgeon: Aviva Signs, MD;  Location: [facility];  Service: General;  Laterality: N/A;  . COLONOSCOPY  11/2015   Dr. Britta Mccreedy: sessile polyp removed (benign). advised repeat colonoscopy in 5 years.   . COLONOSCOPY WITH PROPOFOL N/A 10/26/2017   RMR: Tubular adenoma removed.  Random colon biopsies negative.  Nonbleeding internal hemorrhoids.  Next colonoscopy in 5 years.  . ESOPHAGEAL DILATION  10/26/2017   Procedure: ESOPHAGEAL DILATION;  Surgeon: Daneil Dolin, MD;  Location: [facility];  Service: Endoscopy;;  . ESOPHAGOGASTRODUODENOSCOPY  11/2015   Dr. Britta Mccreedy: hiatal hernia  . ESOPHAGOGASTRODUODENOSCOPY (EGD) WITH PROPOFOL N/A 10/26/2017   RMR: Erosive reflux esophagitis, hiatal hernia.  Small bowel biopsies negative.  . INSERTION OF ANTERIOR SEGMENT AQUEOUS DRAINAGE DEVICE (ISTENT) Left 07/24/2019   Procedure: INSERTION OF ANTERIOR SEGMENT AQUEOUS DRAINAGE DEVICE (ISTENT) LEFT EYE;  Surgeon: Baruch Goldmann, MD;  Location: [facility];  Service: Ophthalmology;  Laterality: Left;  . INSERTION OF ANTERIOR SEGMENT AQUEOUS DRAINAGE DEVICE (ISTENT) Right 08/19/2019   Procedure: INSERTION OF ANTERIOR SEGMENT AQUEOUS DRAINAGE DEVICE (ISTENT) RIGHT EYE;  Surgeon: Baruch Goldmann, MD;  Location: [facility];  Service: Ophthalmology;  Laterality: Right;  . KNEE ARTHROSCOPY Right   . ORIF ANKLE FRACTURE Right     Current Outpatient Medications  Medication Sig Dispense Refill  . albuterol  (VENTOLIN HFA) 108 (90 Base) MCG/ACT inhaler Inhale 1-2 puffs into the lungs every 6 (six) hours as needed for wheezing or shortness of breath.    Marland Kitchen amLODipine (NORVASC) 5 MG tablet Take 5 mg by mouth daily.     Marland Kitchen aspirin EC 81 MG tablet Take 81 mg by mouth daily.    . Blood Glucose Monitoring Suppl (ONETOUCH VERIO) w/Device KIT 1 each by Does not apply route in the morning, at noon, in the evening, and at bedtime. 1 kit 0  . Cholecalciferol (VITAMIN D3) 2000 units TABS Take 2,000 Units by mouth daily.    . citalopram (CELEXA) 40 MG tablet Take 40 mg by mouth daily.    . Continuous Blood Gluc Sensor (FREESTYLE LIBRE 14 DAY SENSOR) MISC Inject 1 each into the skin every 14 (fourteen) days. Use as directed. 2 each 6  . DEXILANT 60 MG capsule TAKE 1 CAPSULE BY MOUTH ONCE DAILY. 90 capsule 3  . donepezil (ARICEPT) 5 MG tablet Take 5 mg by mouth at bedtime.    . eszopiclone (LUNESTA) 2 MG TABS tablet Take 2 mg by mouth at bedtime as needed for sleep. Take immediately before bedtime    . fenofibrate (TRICOR) 145 MG tablet Take 1 tablet (145 mg total) by mouth daily. 90 tablet 0  . gabapentin (NEURONTIN) 800 MG tablet Take 800 mg by  mouth 3 (three) times daily.    Marland Kitchen glipiZIDE (GLUCOTROL XL) 5 MG 24 hr tablet Take 5 mg by mouth daily.    . Insulin Pen Needle (PEN NEEDLES) 31G X 6 MM MISC 1 each by Does not apply route 4 (four) times daily. 400 each 1  . insulin regular human CONCENTRATED (HUMULIN R U-500 KWIKPEN) 500 UNIT/ML kwikpen Inject 50 Units into the skin 3 (three) times daily with meals. 12 mL 2  . lisinopril-hydrochlorothiazide (PRINZIDE,ZESTORETIC) 20-12.5 MG tablet Take 2 tablets by mouth daily.     . metoCLOPramide (REGLAN) 5 MG tablet Take 5 mg by mouth 3 (three) times daily.    Marland Kitchen MOTEGRITY 2 MG TABS TAKE 1 TABLET ONCE DAILY. 30 tablet 5  . ondansetron (ZOFRAN) 8 MG tablet Take 1 tablet (8 mg total) by mouth every 8 (eight) hours as needed for nausea or vomiting. 30 tablet 0  . ONETOUCH VERIO  test strip USE 1 STRIP TO CHECK GLUCOSE 4 TIMES DAILY. 200 strip 3  . pravastatin (PRAVACHOL) 40 MG tablet Take 40 mg by mouth daily.    Marland Kitchen PREMARIN vaginal cream SMARTSIG:1 Vaginal Every Night    . pyridOXINE (VITAMIN B-6) 100 MG tablet Take 100 mg by mouth daily.    . temazepam (RESTORIL) 15 MG capsule Take 15 mg by mouth at bedtime as needed.    . vitamin B-12 (CYANOCOBALAMIN) 500 MCG tablet Take 500 mcg by mouth daily.     No current facility-administered medications for this visit.    Allergies as of 06/15/2020 - Review Complete 05/07/2020  Allergen Reaction Noted  . Sulfa antibiotics Other (See Comments) 03/04/2016    Family History  Problem Relation Age of Onset  . Hypertension Mother   . Colon cancer Mother        diagnosed early 17s  . Cerebral aneurysm Mother   . Alcohol abuse Father   . Pancreatic cancer Sister        deceased in 53s  . Colon cancer Sister        diagnosed 78 y/o  . Stroke Brother   . Cancer Maternal Grandmother   . Cancer Maternal Grandfather   . Cancer Paternal Grandmother   . Other Paternal Grandfather   . Other Brother     Social History   Socioeconomic History  . Marital status: Single    Spouse name: Not on file  . Number of children: Not on file  . Years of education: Not on file  . Highest education level: Not on file  Occupational History  . Not on file  Tobacco Use  . Smoking status: Never Smoker  . Smokeless tobacco: Never Used  Vaping Use  . Vaping Use: Never used  Substance and Sexual Activity  . Alcohol use: No  . Drug use: No  . Sexual activity: Never  Other Topics Concern  . Not on file  Social History Narrative  . Not on file   Social Determinants of Health   Financial Resource Strain:   . Difficulty of Paying Living Expenses: Not on file  Food Insecurity:   . Worried About Charity fundraiser in the Last Year: Not on file  . Ran Out of Food in the Last Year: Not on file  Transportation Needs:   . Lack of  Transportation (Medical): Not on file  . Lack of Transportation (Non-Medical): Not on file  Physical Activity:   . Days of Exercise per Week: Not on file  . Minutes of  Exercise per Session: Not on file  Stress:   . Feeling of Stress : Not on file  Social Connections:   . Frequency of Communication with Friends and Family: Not on file  . Frequency of Social Gatherings with Friends and Family: Not on file  . Attends Religious Services: Not on file  . Active Member of Clubs or Organizations: Not on file  . Attends Archivist Meetings: Not on file  . Marital Status: Not on file    Review of Systems: Gen: Denies fever, chills, anorexia. Denies fatigue, weakness, weight loss.  CV: Denies chest pain, palpitations, syncope, peripheral edema, and claudication. Resp: Denies dyspnea at rest, cough, wheezing, coughing up blood, and pleurisy. GI: Denies vomiting blood, jaundice, and fecal incontinence.   Denies dysphagia or odynophagia. Derm: Denies rash, itching, dry skin Psych: Denies depression, anxiety, memory loss, confusion. No homicidal or suicidal ideation.  Heme: Denies bruising, bleeding, and enlarged lymph nodes.  Physical Exam: There were no vitals taken for this visit. General:   Alert and oriented. No distress noted. Pleasant and cooperative.  Head:  Normocephalic and atraumatic. Eyes:  Conjuctiva clear without scleral icterus. Mouth:  Oral mucosa pink and moist. Good dentition. No lesions. Heart:  S1, S2 present without murmurs appreciated. Lungs:  Clear to auscultation bilaterally. No wheezes, rales, or rhonchi. No distress.  Abdomen:  +BS, soft, non-tender and non-distended. No rebound or guarding. No HSM or masses noted. Msk:  Symmetrical without gross deformities. Normal posture. Extremities:  Without edema. Neurologic:  Alert and  oriented x4 Psych:  Alert and cooperative. Normal mood and affect.

## 2020-06-15 ENCOUNTER — Ambulatory Visit: Payer: Medicare Other | Admitting: Gastroenterology

## 2020-06-16 ENCOUNTER — Other Ambulatory Visit: Payer: Self-pay | Admitting: "Endocrinology

## 2020-06-23 DIAGNOSIS — E119 Type 2 diabetes mellitus without complications: Secondary | ICD-10-CM | POA: Diagnosis not present

## 2020-06-23 DIAGNOSIS — E7800 Pure hypercholesterolemia, unspecified: Secondary | ICD-10-CM | POA: Diagnosis not present

## 2020-06-23 DIAGNOSIS — I10 Essential (primary) hypertension: Secondary | ICD-10-CM | POA: Diagnosis not present

## 2020-06-30 DIAGNOSIS — Z299 Encounter for prophylactic measures, unspecified: Secondary | ICD-10-CM | POA: Diagnosis not present

## 2020-06-30 DIAGNOSIS — E1165 Type 2 diabetes mellitus with hyperglycemia: Secondary | ICD-10-CM | POA: Diagnosis not present

## 2020-06-30 DIAGNOSIS — E1143 Type 2 diabetes mellitus with diabetic autonomic (poly)neuropathy: Secondary | ICD-10-CM | POA: Diagnosis not present

## 2020-06-30 DIAGNOSIS — Z23 Encounter for immunization: Secondary | ICD-10-CM | POA: Diagnosis not present

## 2020-06-30 DIAGNOSIS — G4700 Insomnia, unspecified: Secondary | ICD-10-CM | POA: Diagnosis not present

## 2020-06-30 DIAGNOSIS — R339 Retention of urine, unspecified: Secondary | ICD-10-CM | POA: Diagnosis not present

## 2020-06-30 DIAGNOSIS — I10 Essential (primary) hypertension: Secondary | ICD-10-CM | POA: Diagnosis not present

## 2020-07-05 NOTE — Progress Notes (Signed)
Referring Provider: Monico Blitz, MD Primary Care Physician:  Monico Blitz, MD Primary GI Physician: Dr. Gala Romney  Chief Complaint  Patient presents with  . Diarrhea  . Constipation    HPI:   Carrie Turner is a 69 y.o. female with a history of GERD, constipation, nausea, and chronic epigastric pain. Colonoscopy up-to-date in 2019 with single tubular adenoma, internal hemorrhoids, random colon biopsies negative. Recommended 5-year surveillance.  Historically Linzess caused loose stools and incontinence. Also tried Amitiza 54mg BID in the past.  Currently on Motegrity. EGD in 2019 with erosive reflux esophagitis, empiric dilation of esophagus with dysphagia, hiatal hernia, small bowel biopsies negative for celiac. History of poorly controlled diabetes which has been felt to be contributing to her nausea. GES completed on 08/27/2019 revealed borderline delayed gastric emptying. 87% emptying at 4 hours (normal greater than 90%). RUQ [place]3/[place]/21 without gallstones or wall thickening, CBD slightly prominent at 621m  HIDA wnl in May 2021.   She was last seen in May 2021.  Continued with intermittent nausea occurring most mornings and occasionally in the middle of the night, intermittent vomiting, intermittent epigastric pain about 3 days a week.  Tried Carafate a couple nights ago and felt it helped her nausea.  GERD well controlled on Dexilant 60 mg daily. Constipation was improved, but she was taking Motegrity twice a day rather than once daily.  For constipation, advised she decrease Motegrity to once daily as medication was prescribed and let me know of any worsening symptoms.  Again, suspected gastroparesis may be contributing to her upper GI symptoms.  Counseled on gastroparesis diet. We discussed trial of low-dose Reglan to help with gastric emptying, nausea, and upper abdominal pain.  Patient declined.  I did refill her Carafate as she felt this was helpful during times of nausea, advised to  take nightly and up to 3 times a day with meals. Due to patient feeling her symptoms were related to her gallbladder, she had arrange a consult with general surgery.  She underwent cholecystectomy 12/25/2019.  Patient called 01/23/2020 reporting she continued with the same intermittent upper GI symptoms that she had at her office visit.  Mild improvement after cholecystectomy.  She was again counseled on gastroparesis diet, advised to resume Carafate 3 times daily with meals and at bedtime, continue her other medications.  Requested 1 to 2-week progress report and consider trying Reglan if no improvement.   No progress report was received regarding upper GI symptoms.  She did call 06/08/2020 reporting new onset diarrhea.  Recommended stool studies and hold Motegrity.  Doesn't appear stool studies were completed.    Today:   Diarrhea: Did not complete stool studies. Had significant watery diarrhea in November. Thought she would have to go to the hospital. Spell lasted for 3 days. Currently with alternating diarrhea and constipation. Taking Motegrity as needed, about twice a week. Taking imodium when she has diarrhea, 2-3 times a week. No blood in the stool or black stools.   Has sopped eating popcorn and potato chips as this makes her nauseated. Noticed cabbage doesn't digest well. Eating 1-2 small meals daily. Boiled eggs, oatmeal. Sticks to softer foods as she feels this digest better.  PCP started Reglan as needed for nausea.  Taking Reglan 3-4 mornings a week and 3-4 nights a week. On the days she feels well, she is able to eat better. She took Reglan yesterday.  This morning she is feeling well.  She has not had any vomiting  in quite some time.  Continues with early satiety.   Epigastric pain: Hardly ever.   GERD: Well controlled on Dexilant.  No dysphagia.  Advil maybe once a week for headache.   Glucose continues to be up and down. A1C 8.2 in October. Has follow-up with Dr. Dorris Fetch next month.    Past Medical History:  Diagnosis Date  . Anxiety   . Arthritis   . COPD (chronic obstructive pulmonary disease) ([place])   . Dementia ([place]) 09/30/2019   per patient, early dementia diagnosed after seeing PCP recently  . Depression   . Diabetes mellitus, type II ([place])   . GERD (gastroesophageal reflux disease)   . Glaucoma   . Hyperlipidemia   . Hypertension   . Migraines   . Neuropathy   . Seizures ([place])    seizure was from ETOH, "a long time ago", no med and no recurrance  . Vitamin D deficiency     Past Surgical History:  Procedure Laterality Date  . ABDOMINAL HYSTERECTOMY    . CATARACT EXTRACTION W/PHACO Left 07/24/2019   Procedure: CATARACT EXTRACTION PHACO AND INTRAOCULAR LENS PLACEMENT LEFT EYE  (CDE: 5.60);  Surgeon: Baruch Goldmann, MD;  Location: [facility];  Service: Ophthalmology;  Laterality: Left;  . CATARACT EXTRACTION W/PHACO Right 08/19/2019   Procedure: CATARACT EXTRACTION PHACO AND INTRAOCULAR LENS PLACEMENT RIGHT EYE;  Surgeon: Baruch Goldmann, MD;  Location: [facility];  Service: Ophthalmology;  Laterality: Right;  CDE: 8.83  . CHOLECYSTECTOMY N/A 12/25/2019   Procedure: LAPAROSCOPIC CHOLECYSTECTOMY;  Surgeon: Aviva Signs, MD;  Location: [facility];  Service: General;  Laterality: N/A;  . COLONOSCOPY  11/2015   Dr. Britta Mccreedy: sessile polyp removed (benign). advised repeat colonoscopy in 5 years.   . COLONOSCOPY WITH PROPOFOL N/A 10/26/2017   RMR: Tubular adenoma removed.  Random colon biopsies negative.  Nonbleeding internal hemorrhoids.  Next colonoscopy in 5 years.  . ESOPHAGEAL DILATION  10/26/2017   Procedure: ESOPHAGEAL DILATION;  Surgeon: Daneil Dolin, MD;  [facility];  Service: Endoscopy;;  . ESOPHAGOGASTRODUODENOSCOPY  11/2015   Dr. Britta Mccreedy: hiatal hernia  . ESOPHAGOGASTRODUODENOSCOPY (EGD) WITH PROPOFOL N/A 10/26/2017   RMR: Erosive reflux esophagitis, hiatal hernia.  Small bowel biopsies negative.  . INSERTION OF ANTERIOR SEGMENT AQUEOUS DRAINAGE DEVICE  (ISTENT) Left 07/24/2019   Procedure: INSERTION OF ANTERIOR SEGMENT AQUEOUS DRAINAGE DEVICE (ISTENT) LEFT EYE;  Surgeon: Baruch Goldmann, MD;  Location: [facility];  Service: Ophthalmology;  Laterality: Left;  . INSERTION OF ANTERIOR SEGMENT AQUEOUS DRAINAGE DEVICE (ISTENT) Right 08/19/2019   Procedure: INSERTION OF ANTERIOR SEGMENT AQUEOUS DRAINAGE DEVICE (ISTENT) RIGHT EYE;  Surgeon: Baruch Goldmann, MD;  Location: [facility];  Service: Ophthalmology;  Laterality: Right;  . KNEE ARTHROSCOPY Right   . ORIF ANKLE FRACTURE Right     Current Outpatient Medications  Medication Sig Dispense Refill  . albuterol (VENTOLIN HFA) 108 (90 Base) MCG/ACT inhaler Inhale 1-2 puffs into the lungs every 6 (six) hours as needed for wheezing or shortness of breath.    Marland Kitchen amLODipine (NORVASC) 5 MG tablet Take 5 mg by mouth daily.     Marland Kitchen aspirin EC 81 MG tablet Take 81 mg by mouth daily.    . Blood Glucose Monitoring Suppl (ONETOUCH VERIO) w/Device KIT 1 each by Does not apply route in the morning, at noon, in the evening, and at bedtime. 1 kit 0  . Cholecalciferol (VITAMIN D3) 2000 units TABS Take 2,000 Units by mouth daily.    . citalopram (CELEXA) 40 MG tablet  Take 40 mg by mouth daily.    . Continuous Blood Gluc Sensor (FREESTYLE LIBRE 14 DAY SENSOR) MISC Inject 1 each into the skin every 14 (fourteen) days. Use as directed. 2 each 6  . DEXILANT 60 MG capsule TAKE 1 CAPSULE BY MOUTH ONCE DAILY. 90 capsule 3  . donepezil (ARICEPT) 5 MG tablet Take 5 mg by mouth at bedtime.    . eszopiclone (LUNESTA) 2 MG TABS tablet Take 2 mg by mouth at bedtime as needed for sleep. Take immediately before bedtime    . fenofibrate (TRICOR) 145 MG tablet Take 1 tablet (145 mg total) by mouth daily. 90 tablet 0  . gabapentin (NEURONTIN) 800 MG tablet Take 800 mg by mouth 3 (three) times daily.    Marland Kitchen glipiZIDE (GLUCOTROL XL) 5 MG 24 hr tablet Take 5 mg by mouth daily.    . Insulin Pen Needle (GLOBAL EASE INJECT PEN NEEDLES) 31G X 5 MM MISC 1  each by Other route 3 (three) times daily. 200 each 2  . Insulin Pen Needle (PEN NEEDLES) 31G X 6 MM MISC 1 each by Does not apply route 4 (four) times daily. 400 each 1  . insulin regular human CONCENTRATED (HUMULIN R U-500 KWIKPEN) 500 UNIT/ML kwikpen Inject 50 Units into the skin 3 (three) times daily with meals. 12 mL 2  . lisinopril-hydrochlorothiazide (PRINZIDE,ZESTORETIC) 20-12.5 MG tablet Take 2 tablets by mouth daily.     . metoCLOPramide (REGLAN) 5 MG tablet Take 5 mg by mouth 3 (three) times daily.    . ondansetron (ZOFRAN) 8 MG tablet Take 1 tablet (8 mg total) by mouth every 8 (eight) hours as needed for nausea or vomiting. 30 tablet 0  . ONETOUCH VERIO test strip USE 1 STRIP TO CHECK GLUCOSE 4 TIMES DAILY. 200 strip 3  . pravastatin (PRAVACHOL) 40 MG tablet Take 40 mg by mouth daily.    Marland Kitchen PREMARIN vaginal cream SMARTSIG:1 Vaginal Every Night    . pyridOXINE (VITAMIN B-6) 100 MG tablet Take 100 mg by mouth daily.    . temazepam (RESTORIL) 15 MG capsule Take 15 mg by mouth at bedtime as needed.    . vitamin B-12 (CYANOCOBALAMIN) 500 MCG tablet Take 500 mcg by mouth daily.     No current facility-administered medications for this visit.    Allergies as of 07/06/2020 - Review Complete 07/06/2020  Allergen Reaction Noted  . Sulfa antibiotics Other (See Comments) 03/04/2016    Family History  Problem Relation Age of Onset  . Hypertension Mother   . Colon cancer Mother        diagnosed early 78s  . Cerebral aneurysm Mother   . Alcohol abuse Father   . Pancreatic cancer Sister        deceased in 49s  . Colon cancer Sister        diagnosed 95 y/o  . Stroke Brother   . Cancer Maternal Grandmother   . Cancer Maternal Grandfather   . Cancer Paternal Grandmother   . Other Paternal Grandfather   . Other Brother     Social History   Socioeconomic History  . Marital status: Single    Spouse name: Not on file  . Number of children: Not on file  . Years of education: Not on  file  . Highest education level: Not on file  Occupational History  . Not on file  Tobacco Use  . Smoking status: Never Smoker  . Smokeless tobacco: Never Used  Vaping Use  .  Vaping Use: Never used  Substance and Sexual Activity  . Alcohol use: No  . Drug use: No  . Sexual activity: Never  Other Topics Concern  . Not on file  Social History Narrative  . Not on file   Social Determinants of Health   Financial Resource Strain: Not on file  Food Insecurity: Not on file  Transportation Needs: Not on file  Physical Activity: Not on file  Stress: Not on file  Social Connections: Not on file    Review of Systems: Gen: Denies fever, chills, cold or flulike symptoms, lightheadedness, dizziness, presyncope, syncope. CV: Denies chest pain or palpitations. Resp: Denies dyspnea or cough GI: See HPI Heme: See HPI  Physical Exam: BP (!) 144/81   Pulse 92   Temp (!) 96.9 F (36.1 C) (Temporal)   Ht _0  (1.575 m)   Wt 192 lb 12.8 oz (87.5 kg)   BMI 35.26 kg/m  General:   Alert and oriented. No distress noted. Pleasant and cooperative.  Head:  Normocephalic and atraumatic. Eyes:  Conjuctiva clear without scleral icterus. Heart:  S1, S2 present without murmurs appreciated. Lungs:  Clear to auscultation bilaterally. No wheezes, rales, or rhonchi. No distress.  Abdomen: +BS, soft, non-tender and non-distended. No rebound or guarding. No HSM but limited evaluation due to obese abdomen. No masses noted. Msk:  Symmetrical without gross deformities. Normal posture. Extremities:  Without edema. Neurologic:  Alert and  oriented x4 Psych: Normal mood and affect.

## 2020-07-06 ENCOUNTER — Other Ambulatory Visit: Payer: Self-pay

## 2020-07-06 ENCOUNTER — Encounter: Payer: Self-pay | Admitting: Gastroenterology

## 2020-07-06 ENCOUNTER — Ambulatory Visit (INDEPENDENT_AMBULATORY_CARE_PROVIDER_SITE_OTHER): Payer: Medicare Other | Admitting: Gastroenterology

## 2020-07-06 VITALS — BP 144/81 | HR 92 | Temp 96.9°F | Ht 62.0 in | Wt 192.8 lb

## 2020-07-06 DIAGNOSIS — R112 Nausea with vomiting, unspecified: Secondary | ICD-10-CM | POA: Diagnosis not present

## 2020-07-06 DIAGNOSIS — R198 Other specified symptoms and signs involving the digestive system and abdomen: Secondary | ICD-10-CM | POA: Insufficient documentation

## 2020-07-06 DIAGNOSIS — K219 Gastro-esophageal reflux disease without esophagitis: Secondary | ICD-10-CM | POA: Diagnosis not present

## 2020-07-06 NOTE — Assessment & Plan Note (Signed)
69 year old female who has had chronic history of constipation that has been fairly well managed with Motegrity, now with alternating constipation and diarrhea s/p cholecystectomy.  Patient is currently taking Motegrity "as needed".  She will take Motegrity for constipation, ended up with diarrhea and take Imodium, then revert back to constipation.  No abdominal pain.  No alarm symptoms.  Colonoscopy up-to-date in 2019, due for repeat in 2024.   Suspect Motegrity may now be too strong for patient s/p cholecystectomy.  Suspect she may actually have more regularity to her bowel movements since cholecystectomy without need for daily medications to manage constipation. She may also have a tendency towards bile salt diarrhea. Difficult to evaluate as she is taking both Motegrity and imodium.   Plan: 1.  Stop Motegrity for now. 2.  Advised that she not take anything to help with constipation or diarrhea over the next 3-5 days and monitor. 3.  If she feels constipated, she may use MiraLAX 1 capful (17 g) daily as needed. 4.  Requested she let me know if diarrhea returns and persists. 5.  Discussed the importance of following a low-fat diet.

## 2020-07-06 NOTE — Assessment & Plan Note (Signed)
Chronic.  Well-controlled on Dexilant 60 mg daily.  Advise she continue her current medications.

## 2020-07-06 NOTE — Patient Instructions (Addendum)
For alternating constipation and diarrhea:  1. Stop Motegrity.  2. Do not take anything for your bowels over the next 3-5 days and lets see if you start having regular BMs daily.  3. If you are feeling constipated, take MiraLAX 1 capful (17g) daily in 8 oz of water as needed. 4.  It is important to avoid fried, fatty, greasy foods as he is items will likely cause diarrhea as you no longer have a gallbladder. 5.  Please let me know if diarrhea is persistent.  For nausea: I suspect this is secondary to gastroparesis as we discussed 1. Please start Reglan 5 mg once every morning. Monitor for side effects including unintentional body movements or worsening depression and let us know immediately if this occurs. 2.  You may use Zofran as needed for nausea. 3.  It is important that you follow a strict gastroparesis diet.              Eat 4-6 small meals daily            Follow a low-fat/low fiber diet.            See separate gastroparesis handout. 4. It is also very important that your diabetes is under good control as this will worsen your symtpoms. Keep upcoming appointment with Dr. Dorris Fetch.  5.  Please call with progress report in 4 weeks.   For reflux:  1. Continue Dexilant 60 mg daily.   We will follow-up with you in 8 weeks. Call with a progress report in 4 weeks.   It was good to see you today!  I hope you have a great Christmas!  Aliene Altes, PA-C [facility]

## 2020-07-06 NOTE — Assessment & Plan Note (Addendum)
Chronic history of nausea and early satiety.  Previously, with intermittent vomiting and intermittent epigastric pain, but this has essentially resolved. Suspect symptoms are secondary to gastroparesis in the setting of uncontrolled diabetes.  Prior evaluation with EGD in 2019 with erosive reflux esophagitis, hiatal hernia, small bowel biopsies negative for celiac.  GES in February 2021 with borderline delayed gastric emptying.  RUQ ultrasound March 2021 unrevealing.  HIDA within normal limits in May 21.  Due to patient feeling her symptoms were still gallbladder related, she underwent cholecystectomy in June 2021, but this did not resolve her symptoms.  PCP has prescribed Reglan.  Patient states she is taking Reglan 3-4 mornings and 3-4 evening and notices when she takes this medication, she has significant improvement in her symptoms.  GERD symptoms remain well controlled on Dexilant 60 mg daily.  Plan:  1.  Start taking Reglan 5 mg once every morning before breakfast.  Discussed side effects to Reglan including unintentional body movements, worsening depression, or suicidal ideations.  She will let us know immediately if any of these symptoms occur. 2. Continue Zofran as needed for nausea. 3.  Discussed importance of strict gastroparesis diet.               Eat 4-6 small meals daily            Follow a low-fat/low fiber diet.            Separate gastroparesis handout provided. 4.  Discussed importance of better management of diabetes and advise she keep her upcoming appointment with Dr. Dorris Fetch.   5.  Requested progress report in 4 weeks. Follow-up in 8 weeks.

## 2020-07-07 NOTE — Progress Notes (Signed)
CC'ED TO PCP 

## 2020-07-30 ENCOUNTER — Encounter: Payer: Self-pay | Admitting: Internal Medicine

## 2020-08-11 ENCOUNTER — Ambulatory Visit: Payer: Medicare Other | Admitting: Nurse Practitioner

## 2020-08-24 DIAGNOSIS — K219 Gastro-esophageal reflux disease without esophagitis: Secondary | ICD-10-CM | POA: Diagnosis not present

## 2020-09-01 ENCOUNTER — Ambulatory Visit: Payer: Medicare Other | Admitting: Nurse Practitioner

## 2020-09-07 ENCOUNTER — Ambulatory Visit: Payer: Medicare Other | Admitting: Gastroenterology

## 2020-09-13 NOTE — Progress Notes (Unsigned)
Referring Provider: Monico Blitz, MD Primary Care Physician:  Monico Blitz, MD Primary GI Physician: Dr. Gala Romney  No chief complaint on file.   HPI:   Carrie Turner is a 70 y.o. female with history of GERD, constipation, nausea with intermittent vomiting, chronic epigastric pain.  Previously, Linzess caused loose stools and incontinence, trial of Amitiza 8 mcg twice daily, and more recently on Motegrity.  Colonoscopy up-to-date in 2019 with random colon biopsies negative, due for repeat in 2024.  EGD in 2019 with erosive reflux esophagitis, empiric dilation due to dysphagia, hiatal hernia, small bowel biopsies negative for celiac.  History of poorly controlled diabetes felt to be contributing to nausea.  GES in February 2021 with borderline delayed gastric emptying.  RUQ ultrasound in March 2021 without gallstones or wall thickening, CBD slightly prominent at 6 mm.  HIDA WNL in May 2021.  Patient requested seeing general surgery to discuss possible cholecystectomy and ultimately underwent cholecystectomy in June 2021 with very mild improvement in upper GI symptoms.  Last seen in our office December 2021.  She had called our office reporting acute onset of diarrhea in November 2021.  Recommended stool studies, but these were not completed.  At the time of her office visit, she reported significant diarrhea had resolved within 3 days and was currently alternating between diarrhea and constipation.  Taking Motegrity about twice a week and Imodium 2-3 times a week.  Suspected Motegrity may be too strong s/p cholecystectomy. No BRBPR or melena.  She was advised to stop Motegrity and not take anything for constipation or diarrhea for the next 3-5 days and monitor.  If constipated, use MiraLAX.  Requested she let me know if diarrhea returned and persisted.  Discussed importance of following a low-fat diet.  Regarding upper GI symptoms, PCP had started Reglan as needed for nausea and she was taking this 3-4  mornings a week and 3-4 nights a week.  No vomiting in quite some time.  Continued with early satiety.  Epigastric pain improved.  GERD well controlled on Dexilant.  Glucose continues to be up and down with last A1c 8.2 in October 2021.  Recommended starting Reglan 5 mg once every morning before breakfast. Counseled on gastroparesis diet, importance of better glycemic control, continue Zofran as needed, Dexilant daily, requested progress report in 4 weeks, follow-up in 8 weeks.  No progress report received.  Today:  Nausea/vomiting/early satiety:  GERD:  Alternating constipation diarrhea:   Past Medical History:  Diagnosis Date  . Anxiety   . Arthritis   . COPD (chronic obstructive pulmonary disease) ([place])   . Dementia ([place]) 09/30/2019   per patient, early dementia diagnosed after seeing PCP recently  . Depression   . Diabetes mellitus, type II ([place])   . GERD (gastroesophageal reflux disease)   . Glaucoma   . Hyperlipidemia   . Hypertension   . Migraines   . Neuropathy   . Seizures ([place])    seizure was from ETOH, "a long time ago", no med and no recurrance  . Vitamin D deficiency     Past Surgical History:  Procedure Laterality Date  . ABDOMINAL HYSTERECTOMY    . CATARACT EXTRACTION W/PHACO Left 07/24/2019   Procedure: CATARACT EXTRACTION PHACO AND INTRAOCULAR LENS PLACEMENT LEFT EYE  (CDE: 5.60);  Surgeon: Baruch Goldmann, MD;  Location: [facility];  Service: Ophthalmology;  Laterality: Left;  . CATARACT EXTRACTION W/PHACO Right 08/19/2019   Procedure: CATARACT EXTRACTION PHACO AND INTRAOCULAR LENS PLACEMENT RIGHT EYE;  Surgeon: Baruch Goldmann, MD;  Location: [facility];  Service: Ophthalmology;  Laterality: Right;  CDE: 8.83  . CHOLECYSTECTOMY N/A 12/25/2019   Procedure: LAPAROSCOPIC CHOLECYSTECTOMY;  Surgeon: Aviva Signs, MD;  Location: [facility];  Service: General;  Laterality: N/A;  . COLONOSCOPY  11/2015   Dr. Britta Mccreedy: sessile polyp removed (benign). advised repeat colonoscopy in 5  years.   . COLONOSCOPY WITH PROPOFOL N/A 10/26/2017   RMR: Tubular adenoma removed.  Random colon biopsies negative.  Nonbleeding internal hemorrhoids.  Next colonoscopy in 5 years.  . ESOPHAGEAL DILATION  10/26/2017   Procedure: ESOPHAGEAL DILATION;  Surgeon: Daneil Dolin, MD;  Location: [facility];  Service: Endoscopy;;  . ESOPHAGOGASTRODUODENOSCOPY  11/2015   Dr. Britta Mccreedy: hiatal hernia  . ESOPHAGOGASTRODUODENOSCOPY (EGD) WITH PROPOFOL N/A 10/26/2017   RMR: Erosive reflux esophagitis, hiatal hernia.  Small bowel biopsies negative.  . INSERTION OF ANTERIOR SEGMENT AQUEOUS DRAINAGE DEVICE (ISTENT) Left 07/24/2019   Procedure: INSERTION OF ANTERIOR SEGMENT AQUEOUS DRAINAGE DEVICE (ISTENT) LEFT EYE;  Surgeon: Baruch Goldmann, MD;  Location: [facility];  Service: Ophthalmology;  Laterality: Left;  . INSERTION OF ANTERIOR SEGMENT AQUEOUS DRAINAGE DEVICE (ISTENT) Right 08/19/2019   Procedure: INSERTION OF ANTERIOR SEGMENT AQUEOUS DRAINAGE DEVICE (ISTENT) RIGHT EYE;  Surgeon: Baruch Goldmann, MD;  Location: [facility];  Service: Ophthalmology;  Laterality: Right;  . KNEE ARTHROSCOPY Right   . ORIF ANKLE FRACTURE Right     Current Outpatient Medications  Medication Sig Dispense Refill  . albuterol (VENTOLIN HFA) 108 (90 Base) MCG/ACT inhaler Inhale 1-2 puffs into the lungs every 6 (six) hours as needed for wheezing or shortness of breath.    Marland Kitchen amLODipine (NORVASC) 5 MG tablet Take 5 mg by mouth daily.     Marland Kitchen aspirin EC 81 MG tablet Take 81 mg by mouth daily.    . Blood Glucose Monitoring Suppl (ONETOUCH VERIO) w/Device KIT 1 each by Does not apply route in the morning, at noon, in the evening, and at bedtime. 1 kit 0  . Cholecalciferol (VITAMIN D3) 2000 units TABS Take 2,000 Units by mouth daily.    . citalopram (CELEXA) 40 MG tablet Take 40 mg by mouth daily.    . Continuous Blood Gluc Sensor (FREESTYLE LIBRE 14 DAY SENSOR) MISC Inject 1 each into the skin every 14 (fourteen) days. Use as directed. 2 each 6   . DEXILANT 60 MG capsule TAKE 1 CAPSULE BY MOUTH ONCE DAILY. 90 capsule 3  . donepezil (ARICEPT) 5 MG tablet Take 5 mg by mouth at bedtime.    . eszopiclone (LUNESTA) 2 MG TABS tablet Take 2 mg by mouth at bedtime as needed for sleep. Take immediately before bedtime    . fenofibrate (TRICOR) 145 MG tablet Take 1 tablet (145 mg total) by mouth daily. 90 tablet 0  . gabapentin (NEURONTIN) 800 MG tablet Take 800 mg by mouth 3 (three) times daily.    Marland Kitchen glipiZIDE (GLUCOTROL XL) 5 MG 24 hr tablet Take 5 mg by mouth daily.    . Insulin Pen Needle (GLOBAL EASE INJECT PEN NEEDLES) 31G X 5 MM MISC 1 each by Other route 3 (three) times daily. 200 each 2  . Insulin Pen Needle (PEN NEEDLES) 31G X 6 MM MISC 1 each by Does not apply route 4 (four) times daily. 400 each 1  . insulin regular human CONCENTRATED (HUMULIN R U-500 KWIKPEN) 500 UNIT/ML kwikpen Inject 50 Units into the skin 3 (three) times daily with meals. 12 mL 2  . lisinopril-hydrochlorothiazide (  PRINZIDE,ZESTORETIC) 20-12.5 MG tablet Take 2 tablets by mouth daily.     . metoCLOPramide (REGLAN) 5 MG tablet Take 5 mg by mouth 3 (three) times daily.    . ondansetron (ZOFRAN) 8 MG tablet Take 1 tablet (8 mg total) by mouth every 8 (eight) hours as needed for nausea or vomiting. 30 tablet 0  . ONETOUCH VERIO test strip USE 1 STRIP TO CHECK GLUCOSE 4 TIMES DAILY. 200 strip 3  . pravastatin (PRAVACHOL) 40 MG tablet Take 40 mg by mouth daily.    Marland Kitchen PREMARIN vaginal cream SMARTSIG:1 Vaginal Every Night    . pyridOXINE (VITAMIN B-6) 100 MG tablet Take 100 mg by mouth daily.    . temazepam (RESTORIL) 15 MG capsule Take 15 mg by mouth at bedtime as needed.    . vitamin B-12 (CYANOCOBALAMIN) 500 MCG tablet Take 500 mcg by mouth daily.     No current facility-administered medications for this visit.    Allergies as of 09/14/2020 - Review Complete 07/06/2020  Allergen Reaction Noted  . Sulfa antibiotics Other (See Comments) 03/04/2016    Family History   Problem Relation Age of Onset  . Hypertension Mother   . Colon cancer Mother        diagnosed early 33s  . Cerebral aneurysm Mother   . Alcohol abuse Father   . Pancreatic cancer Sister        deceased in 46s  . Colon cancer Sister        diagnosed 67 y/o  . Stroke Brother   . Cancer Maternal Grandmother   . Cancer Maternal Grandfather   . Cancer Paternal Grandmother   . Other Paternal Grandfather   . Other Brother     Social History   Socioeconomic History  . Marital status: Single    Spouse name: Not on file  . Number of children: Not on file  . Years of education: Not on file  . Highest education level: Not on file  Occupational History  . Not on file  Tobacco Use  . Smoking status: Never Smoker  . Smokeless tobacco: Never Used  Vaping Use  . Vaping Use: Never used  Substance and Sexual Activity  . Alcohol use: No  . Drug use: No  . Sexual activity: Never  Other Topics Concern  . Not on file  Social History Narrative  . Not on file   Social Determinants of Health   Financial Resource Strain: Not on file  Food Insecurity: Not on file  Transportation Needs: Not on file  Physical Activity: Not on file  Stress: Not on file  Social Connections: Not on file    Review of Systems: Gen: Denies fever, chills, anorexia. Denies fatigue, weakness, weight loss.  CV: Denies chest pain, palpitations, syncope, peripheral edema, and claudication. Resp: Denies dyspnea at rest, cough, wheezing, coughing up blood, and pleurisy. GI: Denies vomiting blood, jaundice, and fecal incontinence.   Denies dysphagia or odynophagia. Derm: Denies rash, itching, dry skin Psych: Denies depression, anxiety, memory loss, confusion. No homicidal or suicidal ideation.  Heme: Denies bruising, bleeding, and enlarged lymph nodes.  Physical Exam: There were no vitals taken for this visit. General:   Alert and oriented. No distress noted. Pleasant and cooperative.  Head:  Normocephalic and  atraumatic. Eyes:  Conjuctiva clear without scleral icterus. Mouth:  Oral mucosa pink and moist. Good dentition. No lesions. Heart:  S1, S2 present without murmurs appreciated. Lungs:  Clear to auscultation bilaterally. No wheezes, rales, or rhonchi. No  distress.  Abdomen:  +BS, soft, non-tender and non-distended. No rebound or guarding. No HSM or masses noted. Msk:  Symmetrical without gross deformities. Normal posture. Extremities:  Without edema. Neurologic:  Alert and  oriented x4 Psych:  Alert and cooperative. Normal mood and affect.

## 2020-09-14 ENCOUNTER — Encounter: Payer: Self-pay | Admitting: Internal Medicine

## 2020-09-14 ENCOUNTER — Ambulatory Visit: Payer: Medicare Other | Admitting: Gastroenterology

## 2020-09-29 DIAGNOSIS — E1142 Type 2 diabetes mellitus with diabetic polyneuropathy: Secondary | ICD-10-CM | POA: Diagnosis not present

## 2020-09-29 DIAGNOSIS — Z789 Other specified health status: Secondary | ICD-10-CM | POA: Diagnosis not present

## 2020-09-29 DIAGNOSIS — E1165 Type 2 diabetes mellitus with hyperglycemia: Secondary | ICD-10-CM | POA: Diagnosis not present

## 2020-09-29 DIAGNOSIS — Z299 Encounter for prophylactic measures, unspecified: Secondary | ICD-10-CM | POA: Diagnosis not present

## 2020-09-29 DIAGNOSIS — R030 Elevated blood-pressure reading, without diagnosis of hypertension: Secondary | ICD-10-CM | POA: Diagnosis not present

## 2020-09-29 DIAGNOSIS — I10 Essential (primary) hypertension: Secondary | ICD-10-CM | POA: Diagnosis not present

## 2020-10-02 ENCOUNTER — Other Ambulatory Visit: Payer: Self-pay | Admitting: "Endocrinology

## 2020-10-07 ENCOUNTER — Ambulatory Visit: Payer: Medicare Other | Admitting: Nurse Practitioner

## 2020-10-13 ENCOUNTER — Ambulatory Visit: Payer: Medicare Other | Admitting: Nurse Practitioner

## 2020-10-13 NOTE — Patient Instructions (Incomplete)

## 2020-10-23 ENCOUNTER — Other Ambulatory Visit: Payer: Self-pay | Admitting: "Endocrinology

## 2020-10-26 ENCOUNTER — Other Ambulatory Visit: Payer: Self-pay

## 2020-10-26 ENCOUNTER — Ambulatory Visit: Payer: Medicare Other | Admitting: Nurse Practitioner

## 2020-10-26 NOTE — Patient Instructions (Incomplete)

## 2020-10-30 DIAGNOSIS — Z299 Encounter for prophylactic measures, unspecified: Secondary | ICD-10-CM | POA: Diagnosis not present

## 2020-10-30 DIAGNOSIS — E7800 Pure hypercholesterolemia, unspecified: Secondary | ICD-10-CM | POA: Diagnosis not present

## 2020-10-30 DIAGNOSIS — I10 Essential (primary) hypertension: Secondary | ICD-10-CM | POA: Diagnosis not present

## 2020-11-02 ENCOUNTER — Ambulatory Visit: Payer: Medicare Other | Admitting: Nurse Practitioner

## 2020-11-02 NOTE — Progress Notes (Deleted)
11/02/2020  Endocrinology follow-up note  Subjective:    Patient ID: Carrie Turner, female    DOB: 1950/10/30.  She is being seen in follow-up  for management of diabetes requested by  Monico Blitz, MD  Past Medical History:  Diagnosis Date  . Anxiety   . Arthritis   . COPD (chronic obstructive pulmonary disease) ([place])   . Dementia ([place]) 09/30/2019   per patient, early dementia diagnosed after seeing PCP recently  . Depression   . Diabetes mellitus, type II ([place])   . GERD (gastroesophageal reflux disease)   . Glaucoma   . Hyperlipidemia   . Hypertension   . Migraines   . Neuropathy   . Seizures ([place])    seizure was from ETOH, "a long time ago", no med and no recurrance  . Vitamin D deficiency     Past Surgical History:  Procedure Laterality Date  . ABDOMINAL HYSTERECTOMY    . CATARACT EXTRACTION W/PHACO Left 07/24/2019   Procedure: CATARACT EXTRACTION PHACO AND INTRAOCULAR LENS PLACEMENT LEFT EYE  (CDE: 5.60);  Surgeon: Baruch Goldmann, MD;  Location: [facility];  Service: Ophthalmology;  Laterality: Left;  . CATARACT EXTRACTION W/PHACO Right 08/19/2019   Procedure: CATARACT EXTRACTION PHACO AND INTRAOCULAR LENS PLACEMENT RIGHT EYE;  Surgeon: Baruch Goldmann, MD;  Location: [facility];  Service: Ophthalmology;  Laterality: Right;  CDE: 8.83  . CHOLECYSTECTOMY N/A 12/25/2019   Procedure: LAPAROSCOPIC CHOLECYSTECTOMY;  Surgeon: Aviva Signs, MD;  Location: [facility];  Service: General;  Laterality: N/A;  . COLONOSCOPY  11/2015   Dr. Britta Mccreedy: sessile polyp removed (benign). advised repeat colonoscopy in 5 years.   . COLONOSCOPY WITH PROPOFOL N/A 10/26/2017   RMR: Tubular adenoma removed.  Random colon biopsies negative.  Nonbleeding internal hemorrhoids.  Next colonoscopy in 5 years.  . ESOPHAGEAL DILATION  10/26/2017   Procedure: ESOPHAGEAL DILATION;  Surgeon: Daneil Dolin, MD;  Location: [facility];  Service: Endoscopy;;  . ESOPHAGOGASTRODUODENOSCOPY  11/2015   Dr. Britta Mccreedy: hiatal hernia   . ESOPHAGOGASTRODUODENOSCOPY (EGD) WITH PROPOFOL N/A 10/26/2017   RMR: Erosive reflux esophagitis, hiatal hernia.  Small bowel biopsies negative.  . INSERTION OF ANTERIOR SEGMENT AQUEOUS DRAINAGE DEVICE (ISTENT) Left 07/24/2019   Procedure: INSERTION OF ANTERIOR SEGMENT AQUEOUS DRAINAGE DEVICE (ISTENT) LEFT EYE;  Surgeon: Baruch Goldmann, MD;  Location: [facility];  Service: Ophthalmology;  Laterality: Left;  . INSERTION OF ANTERIOR SEGMENT AQUEOUS DRAINAGE DEVICE (ISTENT) Right 08/19/2019   Procedure: INSERTION OF ANTERIOR SEGMENT AQUEOUS DRAINAGE DEVICE (ISTENT) RIGHT EYE;  Surgeon: Baruch Goldmann, MD;  Location: [facility];  Service: Ophthalmology;  Laterality: Right;  . KNEE ARTHROSCOPY Right   . ORIF ANKLE FRACTURE Right      Social History   Socioeconomic History  . Marital status: Single    Spouse name: Not on file  . Number of children: Not on file  . Years of education: Not on file  . Highest education level: Not on file  Occupational History  . Not on file  Tobacco Use  . Smoking status: Never Smoker  . Smokeless tobacco: Never Used  Vaping Use  . Vaping Use: Never used  Substance and Sexual Activity  . Alcohol use: No  . Drug use: No  . Sexual activity: Never  Other Topics Concern  . Not on file  Social History Narrative  . Not on file   Social Determinants of Health   Financial Resource Strain: Not on file  Food Insecurity: Not on file  Transportation Needs: Not on  file  Physical Activity: Not on file  Stress: Not on file  Social Connections: Not on file  Intimate Partner Violence: Not on file    Current Outpatient Medications on File Prior to Visit  Medication Sig Dispense Refill  . albuterol (VENTOLIN HFA) 108 (90 Base) MCG/ACT inhaler Inhale 1-2 puffs into the lungs every 6 (six) hours as needed for wheezing or shortness of breath.    Marland Kitchen amLODipine (NORVASC) 5 MG tablet Take 5 mg by mouth daily.     Marland Kitchen aspirin EC 81 MG tablet Take 81 mg by mouth daily.    . Blood  Glucose Monitoring Suppl (ONETOUCH VERIO) w/Device KIT 1 each by Does not apply route in the morning, at noon, in the evening, and at bedtime. 1 kit 0  . Cholecalciferol (VITAMIN D3) 2000 units TABS Take 2,000 Units by mouth daily.    . citalopram (CELEXA) 40 MG tablet Take 40 mg by mouth daily.    . Continuous Blood Gluc Sensor (FREESTYLE LIBRE 14 DAY SENSOR) MISC Inject 1 each into the skin every 14 (fourteen) days. Use as directed. 2 each 6  . DEXILANT 60 MG capsule TAKE 1 CAPSULE BY MOUTH ONCE DAILY. 90 capsule 3  . donepezil (ARICEPT) 5 MG tablet Take 5 mg by mouth at bedtime.    . eszopiclone (LUNESTA) 2 MG TABS tablet Take 2 mg by mouth at bedtime as needed for sleep. Take immediately before bedtime    . fenofibrate (TRICOR) 145 MG tablet Take 1 tablet (145 mg total) by mouth daily. 90 tablet 0  . gabapentin (NEURONTIN) 800 MG tablet Take 800 mg by mouth 3 (three) times daily.    Marland Kitchen glipiZIDE (GLUCOTROL XL) 5 MG 24 hr tablet Take 5 mg by mouth daily.    Marland Kitchen HUMULIN R U-500 KWIKPEN 500 UNIT/ML kwikpen INJECT 50 UNITS INTO THE SKIN 3 TIMES A DAY WITH MEALS. 6 mL 0  . Insulin Pen Needle (GLOBAL EASE INJECT PEN NEEDLES) 31G X 5 MM MISC USE UP TO 6 TIMES A DAY OR AS DIRECTED. 100 each 0  . Insulin Pen Needle (PEN NEEDLES) 31G X 6 MM MISC 1 each by Does not apply route 4 (four) times daily. 400 each 1  . lisinopril-hydrochlorothiazide (PRINZIDE,ZESTORETIC) 20-12.5 MG tablet Take 2 tablets by mouth daily.     . metoCLOPramide (REGLAN) 5 MG tablet Take 5 mg by mouth 3 (three) times daily.    . ondansetron (ZOFRAN) 8 MG tablet Take 1 tablet (8 mg total) by mouth every 8 (eight) hours as needed for nausea or vomiting. 30 tablet 0  . ONETOUCH VERIO test strip USE 1 STRIP TO CHECK GLUCOSE 4 TIMES DAILY. 200 strip 3  . pravastatin (PRAVACHOL) 40 MG tablet Take 40 mg by mouth daily.    Marland Kitchen PREMARIN vaginal cream SMARTSIG:1 Vaginal Every Night    . pyridOXINE (VITAMIN B-6) 100 MG tablet Take 100 mg by mouth  daily.    . temazepam (RESTORIL) 15 MG capsule Take 15 mg by mouth at bedtime as needed.    . vitamin B-12 (CYANOCOBALAMIN) 500 MCG tablet Take 500 mcg by mouth daily.     No current facility-administered medications on file prior to visit.    No facility-administered encounter medications on file as of 10/16/2018.    Allergies  Allergen Reactions  . Sulfa Antibiotics Other (See Comments)    Weakness and fatigued      VACCINATION STATUS:  There is no immunization history on file for this  patient. Diabetes She presents for her follow-up diabetic visit. She has type 2 diabetes mellitus. Onset time: diagnosed at approximate age of 72. Her disease course has been improving. Hypoglycemia symptoms include nervousness/anxiousness, sweats and tremors. Pertinent negatives for hypoglycemia include no confusion, headaches, mood changes or seizures. Associated symptoms include fatigue. Pertinent negatives for diabetes include no chest pain, no polydipsia, no polyphagia and no polyuria. There are no hypoglycemic complications. Symptoms are improving. Diabetic complications include peripheral neuropathy. Risk factors for coronary artery disease include diabetes mellitus, dyslipidemia, hypertension, obesity, post-menopausal and sedentary lifestyle. Current diabetic treatment includes intensive insulin program and oral agent (monotherapy). She is compliant with treatment most of the time. Her weight is fluctuating minimally. She is following a generally unhealthy diet. When asked about meal planning, she reported none. She has had a previous visit with a dietitian. She rarely participates in exercise. Her home blood glucose trend is decreasing steadily. An ACE inhibitor/angiotensin II receptor blocker is being taken. She does not see a podiatrist.Eye exam is current.  Hyperlipidemia This is a chronic problem. The current episode started more than 1 year ago. The problem is resistant. Recent lipid tests were  reviewed and are high. Exacerbating diseases include chronic renal disease, diabetes and obesity. Factors aggravating her hyperlipidemia include thiazides. Pertinent negatives include no chest pain, leg pain, myalgias or shortness of breath. Current antihyperlipidemic treatment includes fibric acid derivatives and statins. The current treatment provides mild improvement of lipids. Compliance problems include adherence to exercise, adherence to diet and psychosocial issues.  Risk factors for coronary artery disease include diabetes mellitus, dyslipidemia, hypertension, obesity, post-menopausal and a sedentary lifestyle.  Hypertension This is a chronic problem. The current episode started more than 1 year ago. The problem has been gradually improving since onset. The problem is controlled. Associated symptoms include sweats. Pertinent negatives include no chest pain, headaches or shortness of breath. There are no associated agents to hypertension. Risk factors for coronary artery disease include diabetes mellitus, sedentary lifestyle, obesity, dyslipidemia and post-menopausal state. Past treatments include calcium channel blockers, ACE inhibitors and diuretics. The current treatment provides mild improvement. Compliance problems include diet, exercise and psychosocial issues.  Identifiable causes of hypertension include chronic renal disease.   Review of systems  Constitutional: + Minimally fluctuating body weight,  current There is no height or weight on file to calculate BMI. , no fatigue, no subjective hyperthermia, no subjective hypothermia Eyes: no blurry vision, no xerophthalmia ENT: no sore throat, no nodules palpated in throat, no dysphagia/odynophagia, no hoarseness Cardiovascular: no chest pain, no shortness of breath, no palpitations, no leg swelling Respiratory: no cough, no shortness of breath Gastrointestinal: mild intermittent nausea/vomiting/diarrhea associated with  gastroparesis Musculoskeletal: no muscle/joint aches Skin: no rashes, no hyperemia Neurological: no tremors, no numbness, no tingling, no dizziness Psychiatric: no depression, no anxiety   Objective:    There were no vitals taken for this visit.  Wt Readings from Last 3 Encounters:  07/06/20 192 lb 12.8 oz (87.5 kg)  05/07/20 197 lb 3.2 oz (89.4 kg)  02/06/20 195 lb 9.6 oz (88.7 kg)   BP Readings from Last 3 Encounters:  07/06/20 (!) 144/81  05/07/20 (!) 155/86  02/06/20 (!) 158/86    Physical Exam- Limited  Constitutional:  There is no height or weight on file to calculate BMI. , not in acute distress, normal state of mind Eyes:  EOMI, no exophthalmos Neck: Supple Cardiovascular: RRR, no murmers, rubs, or gallops, no edema Respiratory: Adequate breathing efforts, no crackles, rales,  rhonchi, or wheezing Musculoskeletal: no gross deformities, strength intact in all four extremities, no gross restriction of joint movements Skin:  no rashes, no hyperemia Neurological: no tremor with outstretched hands   Lipid Panel     Component Value Date/Time   CHOL 372 (A) 02/06/2019 0000   TRIG 668 (A) 02/06/2019 0000   HDL 56 02/06/2019 0000     Assessment & Plan:   1) Uncontrolled type 2 diabetes mellitus with complication, with long-term current use of insulin ([place])  - Patient has currently uncontrolled symptomatic type 2 DM since  70 years of age.     - Recent labs are reviewed.   Her diabetes is complicated by noncompliance/nonadherence, obesity and sedentary life and patient remains at a high risk for more acute and chronic complications of diabetes which include CAD, CVA, CKD, retinopathy, and neuropathy. These are all discussed in detail with the patient.  - Nutritional counseling repeated at each appointment due to patients tendency to fall back in to old habits.  - The patient admits there is a room for improvement in their diet and drink choices. -  Suggestion is  made for the patient to avoid simple carbohydrates from their diet including Cakes, Sweet Desserts / Pastries, Ice Cream, Soda (diet and regular), Sweet Tea, Candies, Chips, Cookies, Sweet Pastries,  Store Bought Juices, Alcohol in Excess of  1-2 drinks a day, Artificial Sweeteners, Coffee Creamer, and "Sugar-free" Products. This will help patient to have stable blood glucose profile and potentially avoid unintended weight gain.   - I encouraged the patient to switch to  unprocessed or minimally processed complex starch and increased protein intake (animal or plant source), fruits, and vegetables.   - Patient is advised to stick to a routine mealtimes to eat 3 meals  a day and avoid unnecessary snacks ( to snack only to correct hypoglycemia).  - I have approached patient with the following individualized plan to manage diabetes and patient agrees:   -she is struggling to achieve control of diabetes, likely deals with moderate cognitive deficit. Avoiding hypoglycemia is the #1 priority in her case.   -She was started on Humulin U-500 on last visit, she brought her left over insulin to her appointment today.  -Given the improvement in her glycemic profile, she is advised to continue with her current dose of U-500 with 50 units TID with meals if glucose is above 90 and she is eating.  She is advised to also continue Glipizide 5 mg po daily with breakfast.  -She was educated not to treat for low blood glucose unless her blood sugar drops below 70 (to avoid her treating herself if she has symptoms of low glucose but blood sugar around 100 which she has been doing).  -She is encouraged to continue monitoring blood glucose at least 4 times per day, before meals and before bed, and call the clinic if she has readings less than 70 or greater than 300 for 3 tests in a row.  She would greatly benefit from CGM device.  It was prescribed for her on previous visit, but she never heard anything from her pharmacy to  come get it.  Will re-prescribe Freestyle Libre for patient from [facility].  -She is not a reliable candidate for incretin therapy due to her hypertriglyceridemia.    2) Lipids/HPL:  Her previous lipid panel from 02/06/2019 shows uncontrolled triglycerides of 668.  She is advised to continue Pravastatin 40 mg po daily at bedtime and continue Fenofibrate  145 mg po daily.  Will recheck lipid panel prior to next visit.  3) Hypertension: -Her blood pressure is controlled to target for her age.  She is advised to continue Norvasc 5 mg po daily, Lisinopril-HCT 40-25 mg po daily.   She is advised to continue follow-up closely with Dr. Manuella Ghazi for primary care needs.     I spent 4*** minutes in the care of the patient today including review of labs from [place], Lipids, Thyroid Function, Hematology (current and previous including abstractions from other facilities); face-to-face time discussing  her blood glucose readings/logs, discussing hypoglycemia and hyperglycemia episodes and symptoms, medications doses, her options of short and long term treatment based on the latest standards of care / guidelines;  discussion about incorporating lifestyle medicine;  and documenting the encounter.    Please refer to Patient Instructions for Blood Glucose Monitoring and Insulin/Medications Dosing Guide"  in media tab for additional information. Please  also refer to " Patient Self Inventory" in the Media  tab for reviewed elements of pertinent patient history.  Orlena Sheldon participated in the discussions, expressed understanding, and voiced agreement with the above plans.  All questions were answered to her satisfaction. she is encouraged to contact clinic should she have any questions or concerns prior to her return visit.   Follow up plan: No follow-ups on file.   Rayetta Pigg, [facility] [address] [place], [place] [address] Phone:  [phone] Fax: [phone]  10/16/2018, 1:34 PM

## 2020-11-02 NOTE — Patient Instructions (Incomplete)

## 2020-11-08 NOTE — Progress Notes (Deleted)
Referring Provider: Monico Blitz, MD Primary Care Physician:  Monico Blitz, MD Primary GI Physician: Dr. Gala Romney  No chief complaint on file.   HPI:   Carrie Turner is a 70 y.o. female presenting today for follow-up. History of GERD, constipation, nausea, and chronic epigastric pain. Colonoscopy up-to-date in 2019 with single tubular adenoma, internal hemorrhoids, random colon biopsies negative. Recommended 5-year surveillance. EGD in 2019 with erosive reflux esophagitis, empiric dilation of esophagus with dysphagia, hiatal hernia, small bowel biopsies negative for celiac. History of poorly controlled diabetes which has been felt to be contributing to her nausea. GES completed on 08/27/2019 revealed borderline delayed gastric emptying. 87% emptying at 4 hours (normal greater than 90%).RUQ [place] 10/03/19 without gallstones or wall thickening, CBD slightly prominent at 35m.HIDA wnl in May 2021. Now s/p empiric cholecystectomy in June 2021 with mild improvement in upper GI symptoms.   Last seen in our office 07/23/2020.  She reported 3 days of significant watery diarrhea in November, now with alternating constipation and diarrhea taking Motegrity about twice a week and Imodium 2-3 times a week.  He denies BRBPR or melena.  Continues with intermittent nausea.  She is eating 1 or 2 small meals a day, soft foods as she felt a digested better, and discontinued potato chips and popcorn.  Raw vegetables did not digest well.  PCP has started Reglan which she was taking 3 or 4 mornings a week and 3 or 4 nights a week.  When taking Reglan, she did note improvement and was able to eat better.  No vomiting in quite some time.  Continued with early satiety.  Epigastric pain has essentially resolved.  GERD was well controlled on Dexilant.  Last hemoglobin A1c was 8.2 in October 2021.  Plan includes starting Reglan 5 mg every morning before breakfast, Zofran as needed, continue Dexilant, gastroparesis diet.  Query  whether she may be experiencing bile salt diarrhea and Motegrity may be too strong s/p cholecystectomy.  She was advised to stop Motegrity for now and monitor for the next 3 to 5 days.  If constipated, use MiraLAX.  Let me know if diarrhea returns and persisted.  Counseled on low-fat diet.  Requested progress report in 4 weeks and follow-up in 8 weeks.    No progress report received.  She no showed to her appointment in February.   Today:      Past Medical History:  Diagnosis Date  . Anxiety   . Arthritis   . COPD (chronic obstructive pulmonary disease) ([facility]   . Dementia ([facility] 09/30/2019   per patient, early dementia diagnosed after seeing PCP recently  . Depression   . Diabetes mellitus, type II ([facility]   . GERD (gastroesophageal reflux disease)   . Glaucoma   . Hyperlipidemia   . Hypertension   . Migraines   . Neuropathy   . Seizures ([facility]    seizure was from ETOH, "a long time ago", no med and no recurrance  . Vitamin D deficiency     Past Surgical History:  Procedure Laterality Date  . ABDOMINAL HYSTERECTOMY    . CATARACT EXTRACTION W/PHACO Left 07/24/2019   Procedure: CATARACT EXTRACTION PHACO AND INTRAOCULAR LENS PLACEMENT LEFT EYE  (CDE: 5.60);  Surgeon: WBaruch Goldmann MD;  Location: [facility];  Service: Ophthalmology;  Laterality: Left;  . CATARACT EXTRACTION W/PHACO Right 08/19/2019   Procedure: CATARACT EXTRACTION PHACO AND INTRAOCULAR LENS PLACEMENT RIGHT EYE;  Surgeon: WBaruch Goldmann MD;  Location: [facility];  Service: Ophthalmology;  Laterality: Right;  CDE: 8.83  . CHOLECYSTECTOMY N/A 12/25/2019   Procedure: LAPAROSCOPIC CHOLECYSTECTOMY;  Surgeon: Aviva Signs, MD;  Location: [facility];  Service: General;  Laterality: N/A;  . COLONOSCOPY  11/2015   Dr. Britta Mccreedy: sessile polyp removed (benign). advised repeat colonoscopy in 5 years.   . COLONOSCOPY WITH PROPOFOL N/A 10/26/2017   RMR: Tubular adenoma removed.  Random colon biopsies negative.  Nonbleeding internal hemorrhoids.   Next colonoscopy in 5 years.  . ESOPHAGEAL DILATION  10/26/2017   Procedure: ESOPHAGEAL DILATION;  Surgeon: Daneil Dolin, MD;  Location: [facility];  Service: Endoscopy;;  . ESOPHAGOGASTRODUODENOSCOPY  11/2015   Dr. Britta Mccreedy: hiatal hernia  . ESOPHAGOGASTRODUODENOSCOPY (EGD) WITH PROPOFOL N/A 10/26/2017   RMR: Erosive reflux esophagitis, hiatal hernia.  Small bowel biopsies negative.  . INSERTION OF ANTERIOR SEGMENT AQUEOUS DRAINAGE DEVICE (ISTENT) Left 07/24/2019   Procedure: INSERTION OF ANTERIOR SEGMENT AQUEOUS DRAINAGE DEVICE (ISTENT) LEFT EYE;  Surgeon: Baruch Goldmann, MD;  Location: [facility];  Service: Ophthalmology;  Laterality: Left;  . INSERTION OF ANTERIOR SEGMENT AQUEOUS DRAINAGE DEVICE (ISTENT) Right 08/19/2019   Procedure: INSERTION OF ANTERIOR SEGMENT AQUEOUS DRAINAGE DEVICE (ISTENT) RIGHT EYE;  Surgeon: Baruch Goldmann, MD;  Location: [facility];  Service: Ophthalmology;  Laterality: Right;  . KNEE ARTHROSCOPY Right   . ORIF ANKLE FRACTURE Right     Current Outpatient Medications  Medication Sig Dispense Refill  . albuterol (VENTOLIN HFA) 108 (90 Base) MCG/ACT inhaler Inhale 1-2 puffs into the lungs every 6 (six) hours as needed for wheezing or shortness of breath.    Marland Kitchen amLODipine (NORVASC) 5 MG tablet Take 5 mg by mouth daily.     Marland Kitchen aspirin EC 81 MG tablet Take 81 mg by mouth daily.    . Blood Glucose Monitoring Suppl (ONETOUCH VERIO) w/Device KIT 1 each by Does not apply route in the morning, at noon, in the evening, and at bedtime. 1 kit 0  . Cholecalciferol (VITAMIN D3) 2000 units TABS Take 2,000 Units by mouth daily.    . citalopram (CELEXA) 40 MG tablet Take 40 mg by mouth daily.    . Continuous Blood Gluc Sensor (FREESTYLE LIBRE 14 DAY SENSOR) MISC Inject 1 each into the skin every 14 (fourteen) days. Use as directed. 2 each 6  . DEXILANT 60 MG capsule TAKE 1 CAPSULE BY MOUTH ONCE DAILY. 90 capsule 3  . donepezil (ARICEPT) 5 MG tablet Take 5 mg by mouth at bedtime.    .  eszopiclone (LUNESTA) 2 MG TABS tablet Take 2 mg by mouth at bedtime as needed for sleep. Take immediately before bedtime    . fenofibrate (TRICOR) 145 MG tablet Take 1 tablet (145 mg total) by mouth daily. 90 tablet 0  . gabapentin (NEURONTIN) 800 MG tablet Take 800 mg by mouth 3 (three) times daily.    Marland Kitchen glipiZIDE (GLUCOTROL XL) 5 MG 24 hr tablet Take 5 mg by mouth daily.    Marland Kitchen HUMULIN R U-500 KWIKPEN 500 UNIT/ML kwikpen INJECT 50 UNITS INTO THE SKIN 3 TIMES A DAY WITH MEALS. 6 mL 0  . Insulin Pen Needle (GLOBAL EASE INJECT PEN NEEDLES) 31G X 5 MM MISC USE UP TO 6 TIMES A DAY OR AS DIRECTED. 100 each 0  . Insulin Pen Needle (PEN NEEDLES) 31G X 6 MM MISC 1 each by Does not apply route 4 (four) times daily. 400 each 1  . lisinopril-hydrochlorothiazide (PRINZIDE,ZESTORETIC) 20-12.5 MG tablet Take 2 tablets by mouth daily.     Marland Kitchen  metoCLOPramide (REGLAN) 5 MG tablet Take 5 mg by mouth 3 (three) times daily.    . ondansetron (ZOFRAN) 8 MG tablet Take 1 tablet (8 mg total) by mouth every 8 (eight) hours as needed for nausea or vomiting. 30 tablet 0  . ONETOUCH VERIO test strip USE 1 STRIP TO CHECK GLUCOSE 4 TIMES DAILY. 200 strip 3  . pravastatin (PRAVACHOL) 40 MG tablet Take 40 mg by mouth daily.    Marland Kitchen PREMARIN vaginal cream SMARTSIG:1 Vaginal Every Night    . pyridOXINE (VITAMIN B-6) 100 MG tablet Take 100 mg by mouth daily.    . temazepam (RESTORIL) 15 MG capsule Take 15 mg by mouth at bedtime as needed.    . vitamin B-12 (CYANOCOBALAMIN) 500 MCG tablet Take 500 mcg by mouth daily.     No current facility-administered medications for this visit.    Allergies as of 11/09/2020 - Review Complete 07/06/2020  Allergen Reaction Noted  . Sulfa antibiotics Other (See Comments) 03/04/2016    Family History  Problem Relation Age of Onset  . Hypertension Mother   . Colon cancer Mother        diagnosed early 42s  . Cerebral aneurysm Mother   . Alcohol abuse Father   . Pancreatic cancer Sister         deceased in 2s  . Colon cancer Sister        diagnosed 21 y/o  . Stroke Brother   . Cancer Maternal Grandmother   . Cancer Maternal Grandfather   . Cancer Paternal Grandmother   . Other Paternal Grandfather   . Other Brother     Social History   Socioeconomic History  . Marital status: Single    Spouse name: Not on file  . Number of children: Not on file  . Years of education: Not on file  . Highest education level: Not on file  Occupational History  . Not on file  Tobacco Use  . Smoking status: Never Smoker  . Smokeless tobacco: Never Used  Vaping Use  . Vaping Use: Never used  Substance and Sexual Activity  . Alcohol use: No  . Drug use: No  . Sexual activity: Never  Other Topics Concern  . Not on file  Social History Narrative  . Not on file   Social Determinants of Health   Financial Resource Strain: Not on file  Food Insecurity: Not on file  Transportation Needs: Not on file  Physical Activity: Not on file  Stress: Not on file  Social Connections: Not on file    Review of Systems: Gen: Denies fever, chills, anorexia. Denies fatigue, weakness, weight loss.  CV: Denies chest pain, palpitations, syncope, peripheral edema, and claudication. Resp: Denies dyspnea at rest, cough, wheezing, coughing up blood, and pleurisy. GI: Denies vomiting blood, jaundice, and fecal incontinence.   Denies dysphagia or odynophagia. Derm: Denies rash, itching, dry skin Psych: Denies depression, anxiety, memory loss, confusion. No homicidal or suicidal ideation.  Heme: Denies bruising, bleeding, and enlarged lymph nodes.  Physical Exam: There were no vitals taken for this visit. General:   Alert and oriented. No distress noted. Pleasant and cooperative.  Head:  Normocephalic and atraumatic. Eyes:  Conjuctiva clear without scleral icterus. Mouth:  Oral mucosa pink and moist. Good dentition. No lesions. Heart:  S1, S2 present without murmurs appreciated. Lungs:  Clear to  auscultation bilaterally. No wheezes, rales, or rhonchi. No distress.  Abdomen:  +BS, soft, non-tender and non-distended. No rebound or guarding. No HSM  or masses noted. Msk:  Symmetrical without gross deformities. Normal posture. Extremities:  Without edema. Neurologic:  Alert and  oriented x4 Psych:  Alert and cooperative. Normal mood and affect.

## 2020-11-09 ENCOUNTER — Ambulatory Visit: Payer: Medicare Other | Admitting: Gastroenterology

## 2020-11-10 NOTE — Patient Instructions (Incomplete)

## 2020-11-11 ENCOUNTER — Ambulatory Visit: Payer: Medicare Other | Admitting: Nurse Practitioner

## 2020-11-11 ENCOUNTER — Other Ambulatory Visit: Payer: Self-pay | Admitting: "Endocrinology

## 2020-11-11 DIAGNOSIS — I10 Essential (primary) hypertension: Secondary | ICD-10-CM

## 2020-11-11 DIAGNOSIS — E1165 Type 2 diabetes mellitus with hyperglycemia: Secondary | ICD-10-CM

## 2020-11-11 DIAGNOSIS — Z9119 Patient's noncompliance with other medical treatment and regimen: Secondary | ICD-10-CM

## 2020-11-11 DIAGNOSIS — E782 Mixed hyperlipidemia: Secondary | ICD-10-CM

## 2020-11-11 NOTE — Telephone Encounter (Signed)
Pt called back, I made her aware she will get a one month supply with no refills and must keep next appt for further refills. She verbalized understanding

## 2020-11-11 NOTE — Telephone Encounter (Signed)
She can only have a 1 month supply.  She desperately needs a follow up visit to assess progress and adjust doses if necessary.

## 2020-11-11 NOTE — Telephone Encounter (Signed)
Pt canceled her appt again today due to car trouble and is still requesting a refill.

## 2020-11-11 NOTE — Progress Notes (Deleted)
11/11/2020  Endocrinology follow-up note  Subjective:    Patient ID: Carrie Turner, female    DOB: Oct 26, 1950.  She is being seen in follow-up  for management of diabetes requested by  Monico Blitz, MD  Past Medical History:  Diagnosis Date  . Anxiety   . Arthritis   . COPD (chronic obstructive pulmonary disease) ([place])   . Dementia ([place]) 09/30/2019   per patient, early dementia diagnosed after seeing PCP recently  . Depression   . Diabetes mellitus, type II ([place])   . GERD (gastroesophageal reflux disease)   . Glaucoma   . Hyperlipidemia   . Hypertension   . Migraines   . Neuropathy   . Seizures ([place])    seizure was from ETOH, "a long time ago", no med and no recurrance  . Vitamin D deficiency     Past Surgical History:  Procedure Laterality Date  . ABDOMINAL HYSTERECTOMY    . CATARACT EXTRACTION W/PHACO Left 07/24/2019   Procedure: CATARACT EXTRACTION PHACO AND INTRAOCULAR LENS PLACEMENT LEFT EYE  (CDE: 5.60);  Surgeon: Baruch Goldmann, MD;  Location: [facility];  Service: Ophthalmology;  Laterality: Left;  . CATARACT EXTRACTION W/PHACO Right 08/19/2019   Procedure: CATARACT EXTRACTION PHACO AND INTRAOCULAR LENS PLACEMENT RIGHT EYE;  Surgeon: Baruch Goldmann, MD;  Location: [facility];  Service: Ophthalmology;  Laterality: Right;  CDE: 8.83  . CHOLECYSTECTOMY N/A 12/25/2019   Procedure: LAPAROSCOPIC CHOLECYSTECTOMY;  Surgeon: Aviva Signs, MD;  Location: [facility];  Service: General;  Laterality: N/A;  . COLONOSCOPY  11/2015   Dr. Britta Mccreedy: sessile polyp removed (benign). advised repeat colonoscopy in 5 years.   . COLONOSCOPY WITH PROPOFOL N/A 10/26/2017   RMR: Tubular adenoma removed.  Random colon biopsies negative.  Nonbleeding internal hemorrhoids.  Next colonoscopy in 5 years.  . ESOPHAGEAL DILATION  10/26/2017   Procedure: ESOPHAGEAL DILATION;  Surgeon: Daneil Dolin, MD;  Location: [facility];  Service: Endoscopy;;  . ESOPHAGOGASTRODUODENOSCOPY  11/2015   Dr. Britta Mccreedy: hiatal hernia   . ESOPHAGOGASTRODUODENOSCOPY (EGD) WITH PROPOFOL N/A 10/26/2017   RMR: Erosive reflux esophagitis, hiatal hernia.  Small bowel biopsies negative.  . INSERTION OF ANTERIOR SEGMENT AQUEOUS DRAINAGE DEVICE (ISTENT) Left 07/24/2019   Procedure: INSERTION OF ANTERIOR SEGMENT AQUEOUS DRAINAGE DEVICE (ISTENT) LEFT EYE;  Surgeon: Baruch Goldmann, MD;  Location: [facility];  Service: Ophthalmology;  Laterality: Left;  . INSERTION OF ANTERIOR SEGMENT AQUEOUS DRAINAGE DEVICE (ISTENT) Right 08/19/2019   Procedure: INSERTION OF ANTERIOR SEGMENT AQUEOUS DRAINAGE DEVICE (ISTENT) RIGHT EYE;  Surgeon: Baruch Goldmann, MD;  Location: [facility];  Service: Ophthalmology;  Laterality: Right;  . KNEE ARTHROSCOPY Right   . ORIF ANKLE FRACTURE Right      Social History   Socioeconomic History  . Marital status: Single    Spouse name: Not on file  . Number of children: Not on file  . Years of education: Not on file  . Highest education level: Not on file  Occupational History  . Not on file  Tobacco Use  . Smoking status: Never Smoker  . Smokeless tobacco: Never Used  Vaping Use  . Vaping Use: Never used  Substance and Sexual Activity  . Alcohol use: No  . Drug use: No  . Sexual activity: Never  Other Topics Concern  . Not on file  Social History Narrative  . Not on file   Social Determinants of Health   Financial Resource Strain: Not on file  Food Insecurity: Not on file  Transportation Needs: Not on  file  Physical Activity: Not on file  Stress: Not on file  Social Connections: Not on file  Intimate Partner Violence: Not on file    Current Outpatient Medications on File Prior to Visit  Medication Sig Dispense Refill  . albuterol (VENTOLIN HFA) 108 (90 Base) MCG/ACT inhaler Inhale 1-2 puffs into the lungs every 6 (six) hours as needed for wheezing or shortness of breath.    Marland Kitchen amLODipine (NORVASC) 5 MG tablet Take 5 mg by mouth daily.     Marland Kitchen aspirin EC 81 MG tablet Take 81 mg by mouth daily.    . Blood  Glucose Monitoring Suppl (ONETOUCH VERIO) w/Device KIT 1 each by Does not apply route in the morning, at noon, in the evening, and at bedtime. 1 kit 0  . Cholecalciferol (VITAMIN D3) 2000 units TABS Take 2,000 Units by mouth daily.    . citalopram (CELEXA) 40 MG tablet Take 40 mg by mouth daily.    . Continuous Blood Gluc Sensor (FREESTYLE LIBRE 14 DAY SENSOR) MISC Inject 1 each into the skin every 14 (fourteen) days. Use as directed. 2 each 6  . DEXILANT 60 MG capsule TAKE 1 CAPSULE BY MOUTH ONCE DAILY. 90 capsule 3  . donepezil (ARICEPT) 5 MG tablet Take 5 mg by mouth at bedtime.    . eszopiclone (LUNESTA) 2 MG TABS tablet Take 2 mg by mouth at bedtime as needed for sleep. Take immediately before bedtime    . fenofibrate (TRICOR) 145 MG tablet Take 1 tablet (145 mg total) by mouth daily. 90 tablet 0  . gabapentin (NEURONTIN) 800 MG tablet Take 800 mg by mouth 3 (three) times daily.    Marland Kitchen glipiZIDE (GLUCOTROL XL) 5 MG 24 hr tablet Take 5 mg by mouth daily.    Marland Kitchen HUMULIN R U-500 KWIKPEN 500 UNIT/ML kwikpen INJECT 50 UNITS INTO THE SKIN 3 TIMES A DAY WITH MEALS. 6 mL 0  . Insulin Pen Needle (GLOBAL EASE INJECT PEN NEEDLES) 31G X 5 MM MISC USE UP TO 6 TIMES A DAY OR AS DIRECTED. 100 each 0  . Insulin Pen Needle (PEN NEEDLES) 31G X 6 MM MISC 1 each by Does not apply route 4 (four) times daily. 400 each 1  . lisinopril-hydrochlorothiazide (PRINZIDE,ZESTORETIC) 20-12.5 MG tablet Take 2 tablets by mouth daily.     . metoCLOPramide (REGLAN) 5 MG tablet Take 5 mg by mouth 3 (three) times daily.    . ondansetron (ZOFRAN) 8 MG tablet Take 1 tablet (8 mg total) by mouth every 8 (eight) hours as needed for nausea or vomiting. 30 tablet 0  . ONETOUCH VERIO test strip USE 1 STRIP TO CHECK GLUCOSE 4 TIMES DAILY. 200 strip 3  . pravastatin (PRAVACHOL) 40 MG tablet Take 40 mg by mouth daily.    Marland Kitchen PREMARIN vaginal cream SMARTSIG:1 Vaginal Every Night    . pyridOXINE (VITAMIN B-6) 100 MG tablet Take 100 mg by mouth  daily.    . temazepam (RESTORIL) 15 MG capsule Take 15 mg by mouth at bedtime as needed.    . vitamin B-12 (CYANOCOBALAMIN) 500 MCG tablet Take 500 mcg by mouth daily.     No current facility-administered medications on file prior to visit.    No facility-administered encounter medications on file as of 10/16/2018.    Allergies  Allergen Reactions  . Sulfa Antibiotics Other (See Comments)    Weakness and fatigued      VACCINATION STATUS:  There is no immunization history on file for this  patient. Diabetes She presents for her follow-up diabetic visit. She has type 2 diabetes mellitus. Onset time: diagnosed at approximate age of 4. Her disease course has been stable. Hypoglycemia symptoms include nervousness/anxiousness, sweats and tremors. Pertinent negatives for hypoglycemia include no confusion, headaches, mood changes or seizures. Associated symptoms include fatigue. Pertinent negatives for diabetes include no chest pain, no polydipsia, no polyphagia and no polyuria. There are no hypoglycemic complications. Symptoms are improving. Diabetic complications include peripheral neuropathy. Risk factors for coronary artery disease include diabetes mellitus, dyslipidemia, hypertension, obesity, post-menopausal and sedentary lifestyle. Current diabetic treatment includes intensive insulin program and oral agent (monotherapy). She is compliant with treatment most of the time. Her weight is fluctuating minimally. She is following a generally unhealthy diet. When asked about meal planning, she reported none. She has had a previous visit with a dietitian. She rarely participates in exercise. Her home blood glucose trend is fluctuating minimally. An ACE inhibitor/angiotensin II receptor blocker is being taken. She does not see a podiatrist.Eye exam is current.  Hyperlipidemia This is a chronic problem. The current episode started more than 1 year ago. The problem is resistant. Recent lipid tests were  reviewed and are high. Exacerbating diseases include chronic renal disease, diabetes and obesity. Factors aggravating her hyperlipidemia include thiazides. Pertinent negatives include no chest pain, leg pain, myalgias or shortness of breath. Current antihyperlipidemic treatment includes fibric acid derivatives and statins. The current treatment provides mild improvement of lipids. Compliance problems include adherence to exercise, adherence to diet and psychosocial issues.  Risk factors for coronary artery disease include diabetes mellitus, dyslipidemia, hypertension, obesity, post-menopausal and a sedentary lifestyle.  Hypertension This is a chronic problem. The current episode started more than 1 year ago. The problem has been gradually improving since onset. The problem is controlled. Associated symptoms include sweats. Pertinent negatives include no chest pain, headaches or shortness of breath. There are no associated agents to hypertension. Risk factors for coronary artery disease include diabetes mellitus, sedentary lifestyle, obesity, dyslipidemia and post-menopausal state. Past treatments include calcium channel blockers, ACE inhibitors and diuretics. The current treatment provides mild improvement. Compliance problems include diet, exercise and psychosocial issues.  Identifiable causes of hypertension include chronic renal disease.   Review of systems  Constitutional: + Minimally fluctuating body weight,  current There is no height or weight on file to calculate BMI. , no fatigue, no subjective hyperthermia, no subjective hypothermia Eyes: no blurry vision, no xerophthalmia ENT: no sore throat, no nodules palpated in throat, no dysphagia/odynophagia, no hoarseness Cardiovascular: no chest pain, no shortness of breath, no palpitations, no leg swelling Respiratory: no cough, no shortness of breath Gastrointestinal: mild intermittent nausea/vomiting/diarrhea associated with  gastroparesis Musculoskeletal: no muscle/joint aches Skin: no rashes, no hyperemia Neurological: no tremors, no numbness, no tingling, no dizziness Psychiatric: no depression, no anxiety   Objective:    There were no vitals taken for this visit.  Wt Readings from Last 3 Encounters:  07/06/20 192 lb 12.8 oz (87.5 kg)  05/07/20 197 lb 3.2 oz (89.4 kg)  02/06/20 195 lb 9.6 oz (88.7 kg)   BP Readings from Last 3 Encounters:  07/06/20 (!) 144/81  05/07/20 (!) 155/86  02/06/20 (!) 158/86    Physical Exam- Limited  Constitutional:  There is no height or weight on file to calculate BMI. , not in acute distress, normal state of mind Eyes:  EOMI, no exophthalmos Neck: Supple Cardiovascular: RRR, no murmers, rubs, or gallops, no edema Respiratory: Adequate breathing efforts, no crackles, rales,  rhonchi, or wheezing Musculoskeletal: no gross deformities, strength intact in all four extremities, no gross restriction of joint movements Skin:  no rashes, no hyperemia Neurological: no tremor with outstretched hands   Lipid Panel     Component Value Date/Time   CHOL 372 (A) 02/06/2019 0000   TRIG 668 (A) 02/06/2019 0000   HDL 56 02/06/2019 0000     Assessment & Plan:   1) Uncontrolled type 2 diabetes mellitus with complication, with long-term current use of insulin ([place])  - Patient has currently uncontrolled symptomatic type 2 DM since  70 years of age.     - Recent labs are reviewed.   Her diabetes is complicated by noncompliance/nonadherence, obesity and sedentary life and patient remains at a high risk for more acute and chronic complications of diabetes which include CAD, CVA, CKD, retinopathy, and neuropathy. These are all discussed in detail with the patient.  - Nutritional counseling repeated at each appointment due to patients tendency to fall back in to old habits.  - The patient admits there is a room for improvement in their diet and drink choices. -  Suggestion is  made for the patient to avoid simple carbohydrates from their diet including Cakes, Sweet Desserts / Pastries, Ice Cream, Soda (diet and regular), Sweet Tea, Candies, Chips, Cookies, Sweet Pastries,  Store Bought Juices, Alcohol in Excess of  1-2 drinks a day, Artificial Sweeteners, Coffee Creamer, and "Sugar-free" Products. This will help patient to have stable blood glucose profile and potentially avoid unintended weight gain.   - I encouraged the patient to switch to  unprocessed or minimally processed complex starch and increased protein intake (animal or plant source), fruits, and vegetables.   - Patient is advised to stick to a routine mealtimes to eat 3 meals  a day and avoid unnecessary snacks ( to snack only to correct hypoglycemia).  - I have approached patient with the following individualized plan to manage diabetes and patient agrees:   -she is struggling to achieve control of diabetes, likely deals with moderate cognitive deficit. Avoiding hypoglycemia is the #1 priority in her case.   -She was started on Humulin U-500 on last visit, she brought her left over insulin to her appointment today.  -Given the improvement in her glycemic profile, she is advised to continue with her current dose of U-500 with 50 units TID with meals if glucose is above 90 and she is eating.  She is advised to also continue Glipizide 5 mg po daily with breakfast.  -She was educated not to treat for low blood glucose unless her blood sugar drops below 70 (to avoid her treating herself if she has symptoms of low glucose but blood sugar around 100 which she has been doing).  -She is encouraged to continue monitoring blood glucose at least 4 times per day, before meals and before bed, and call the clinic if she has readings less than 70 or greater than 300 for 3 tests in a row.  She would greatly benefit from CGM device.  It was prescribed for her on previous visit, but she never heard anything from her pharmacy to  come get it.  Will re-prescribe Freestyle Libre for patient from [facility].  -She is not a reliable candidate for incretin therapy due to her hypertriglyceridemia.    2) Lipids/HPL:  Her previous lipid panel from 02/06/2019 shows uncontrolled triglycerides of 668.  She is advised to continue Pravastatin 40 mg po daily at bedtime and continue Fenofibrate  145 mg po daily.  Will recheck lipid panel prior to next visit.  3) Hypertension: -Her blood pressure is controlled to target for her age.  She is advised to continue Norvasc 5 mg po daily, Lisinopril-HCT 40-25 mg po daily.   She is advised to continue follow-up closely with Dr. Manuella Ghazi for primary care needs.     I spent 4*** minutes in the care of the patient today including review of labs from [place], Lipids, Thyroid Function, Hematology (current and previous including abstractions from other facilities); face-to-face time discussing  her blood glucose readings/logs, discussing hypoglycemia and hyperglycemia episodes and symptoms, medications doses, her options of short and long term treatment based on the latest standards of care / guidelines;  discussion about incorporating lifestyle medicine;  and documenting the encounter.    Please refer to Patient Instructions for Blood Glucose Monitoring and Insulin/Medications Dosing Guide"  in media tab for additional information. Please  also refer to " Patient Self Inventory" in the Media  tab for reviewed elements of pertinent patient history.  Carrie Turner participated in the discussions, expressed understanding, and voiced agreement with the above plans.  All questions were answered to her satisfaction. she is encouraged to contact clinic should she have any questions or concerns prior to her return visit.   Follow up plan: No follow-ups on file.   Rayetta Pigg, [facility] [address] [place], [place] [address] Phone:  [phone] Fax: [phone]  10/16/2018, 1:34 PM

## 2020-11-16 ENCOUNTER — Encounter: Payer: Self-pay | Admitting: Nurse Practitioner

## 2020-11-16 ENCOUNTER — Other Ambulatory Visit: Payer: Self-pay

## 2020-11-16 ENCOUNTER — Ambulatory Visit (INDEPENDENT_AMBULATORY_CARE_PROVIDER_SITE_OTHER): Payer: Medicare Other | Admitting: Nurse Practitioner

## 2020-11-16 DIAGNOSIS — I10 Essential (primary) hypertension: Secondary | ICD-10-CM

## 2020-11-16 DIAGNOSIS — Z9119 Patient's noncompliance with other medical treatment and regimen: Secondary | ICD-10-CM

## 2020-11-16 DIAGNOSIS — E559 Vitamin D deficiency, unspecified: Secondary | ICD-10-CM | POA: Diagnosis not present

## 2020-11-16 DIAGNOSIS — Z794 Long term (current) use of insulin: Secondary | ICD-10-CM | POA: Diagnosis not present

## 2020-11-16 DIAGNOSIS — E118 Type 2 diabetes mellitus with unspecified complications: Secondary | ICD-10-CM

## 2020-11-16 DIAGNOSIS — E1165 Type 2 diabetes mellitus with hyperglycemia: Secondary | ICD-10-CM

## 2020-11-16 DIAGNOSIS — E782 Mixed hyperlipidemia: Secondary | ICD-10-CM

## 2020-11-16 DIAGNOSIS — IMO0002 Reserved for concepts with insufficient information to code with codable children: Secondary | ICD-10-CM

## 2020-11-16 DIAGNOSIS — Z91199 Patient's noncompliance with other medical treatment and regimen due to unspecified reason: Secondary | ICD-10-CM

## 2020-11-16 MED ORDER — HUMULIN R U-500 KWIKPEN 500 UNIT/ML ~~LOC~~ SOPN: 55.0000 [IU] | PEN_INJECTOR | Freq: Three times a day (TID) | SUBCUTANEOUS | 0 refills | Status: DC

## 2020-11-16 NOTE — Patient Instructions (Signed)
Advice for Weight Management  -For most of us the best way to lose weight is by diet management. Generally speaking, diet management means consuming less calories intentionally which over time brings about progressive weight loss.  This can be achieved more effectively by restricting carbohydrate consumption to the minimum possible.  So, it is critically important to know your numbers: how much calorie you are consuming and how much calorie you need. More importantly, our carbohydrates sources should be unprocessed or minimally processed complex starch food items.   Sometimes, it is important to balance nutrition by increasing protein intake (animal or plant source), fruits, and vegetables.  -Sticking to a routine mealtime to eat 3 meals a day and avoiding unnecessary snacks is shown to have a big role in weight control. Under normal circumstances, the only time we lose real weight is when we are hungry, so allow hunger to take place- hunger means no food between meal times, only water.  It is not advisable to starve.   -It is better to avoid simple carbohydrates including: Cakes, Sweet Desserts, Ice Cream, Soda (diet and regular), Sweet Tea, Candies, Chips, Cookies, Store Bought Juices, Alcohol in Excess of  1-2 drinks a day, Artificial Sweeteners, Doughnuts, Coffee Creamers, "Sugar-free" Products, etc, etc.  This is not a complete list.....    -Consulting with certified diabetes educators is proven to provide you with the most accurate and current information on diet.  Also, you may be  interested in discussing diet options/exchanges , we can schedule a visit with Carrie Turner, RDN, CDE for individualized nutrition education.  -Exercise: If you are able: 30 -60 minutes a day ,4 days a week, or 150 minutes a week.  The longer the better.  Combine stretch, strength, and aerobic activities.  If you were told in the past that you have high risk for cardiovascular diseases, you may seek evaluation by  your heart doctor prior to initiating moderate to intense exercise programs.    

## 2020-11-16 NOTE — Progress Notes (Signed)
11/16/2020  Endocrinology follow-up note  Subjective:    Patient ID: Carrie Turner, female    DOB: 02/21/1951.  She is being seen in follow-up  for management of diabetes requested by  Monico Blitz, MD  Past Medical History:  Diagnosis Date  . Anxiety   . Arthritis   . COPD (chronic obstructive pulmonary disease) ([place])   . Dementia ([place]) 09/30/2019   per patient, early dementia diagnosed after seeing PCP recently  . Depression   . Diabetes mellitus, type II ([place])   . GERD (gastroesophageal reflux disease)   . Glaucoma   . Hyperlipidemia   . Hypertension   . Migraines   . Neuropathy   . Seizures ([place])    seizure was from ETOH, "a long time ago", no med and no recurrance  . Vitamin D deficiency     Past Surgical History:  Procedure Laterality Date  . ABDOMINAL HYSTERECTOMY    . CATARACT EXTRACTION W/PHACO Left 07/24/2019   Procedure: CATARACT EXTRACTION PHACO AND INTRAOCULAR LENS PLACEMENT LEFT EYE  (CDE: 5.60);  Surgeon: Baruch Goldmann, MD;  Location: [facility];  Service: Ophthalmology;  Laterality: Left;  . CATARACT EXTRACTION W/PHACO Right 08/19/2019   Procedure: CATARACT EXTRACTION PHACO AND INTRAOCULAR LENS PLACEMENT RIGHT EYE;  Surgeon: Baruch Goldmann, MD;  Location: [facility];  Service: Ophthalmology;  Laterality: Right;  CDE: 8.83  . CHOLECYSTECTOMY N/A 12/25/2019   Procedure: LAPAROSCOPIC CHOLECYSTECTOMY;  Surgeon: Aviva Signs, MD;  Location: [facility];  Service: General;  Laterality: N/A;  . COLONOSCOPY  11/2015   Dr. Britta Mccreedy: sessile polyp removed (benign). advised repeat colonoscopy in 5 years.   . COLONOSCOPY WITH PROPOFOL N/A 10/26/2017   RMR: Tubular adenoma removed.  Random colon biopsies negative.  Nonbleeding internal hemorrhoids.  Next colonoscopy in 5 years.  . ESOPHAGEAL DILATION  10/26/2017   Procedure: ESOPHAGEAL DILATION;  Surgeon: Daneil Dolin, MD;  Location: [facility];  Service: Endoscopy;;  . ESOPHAGOGASTRODUODENOSCOPY  11/2015   Dr. Britta Mccreedy: hiatal hernia   . ESOPHAGOGASTRODUODENOSCOPY (EGD) WITH PROPOFOL N/A 10/26/2017   RMR: Erosive reflux esophagitis, hiatal hernia.  Small bowel biopsies negative.  . INSERTION OF ANTERIOR SEGMENT AQUEOUS DRAINAGE DEVICE (ISTENT) Left 07/24/2019   Procedure: INSERTION OF ANTERIOR SEGMENT AQUEOUS DRAINAGE DEVICE (ISTENT) LEFT EYE;  Surgeon: Baruch Goldmann, MD;  Location: [facility];  Service: Ophthalmology;  Laterality: Left;  . INSERTION OF ANTERIOR SEGMENT AQUEOUS DRAINAGE DEVICE (ISTENT) Right 08/19/2019   Procedure: INSERTION OF ANTERIOR SEGMENT AQUEOUS DRAINAGE DEVICE (ISTENT) RIGHT EYE;  Surgeon: Baruch Goldmann, MD;  Location: [facility];  Service: Ophthalmology;  Laterality: Right;  . KNEE ARTHROSCOPY Right   . ORIF ANKLE FRACTURE Right      Social History   Socioeconomic History  . Marital status: Single    Spouse name: Not on file  . Number of children: Not on file  . Years of education: Not on file  . Highest education level: Not on file  Occupational History  . Not on file  Tobacco Use  . Smoking status: Never Smoker  . Smokeless tobacco: Never Used  Vaping Use  . Vaping Use: Never used  Substance and Sexual Activity  . Alcohol use: No  . Drug use: No  . Sexual activity: Never  Other Topics Concern  . Not on file  Social History Narrative  . Not on file   Social Determinants of Health   Financial Resource Strain: Not on file  Food Insecurity: Not on file  Transportation Needs: Not on  file  Physical Activity: Not on file  Stress: Not on file  Social Connections: Not on file  Intimate Partner Violence: Not on file    Current Outpatient Medications on File Prior to Visit  Medication Sig Dispense Refill  . albuterol (VENTOLIN HFA) 108 (90 Base) MCG/ACT inhaler Inhale 1-2 puffs into the lungs every 6 (six) hours as needed for wheezing or shortness of breath.    Marland Kitchen amLODipine (NORVASC) 5 MG tablet Take 5 mg by mouth daily.     Marland Kitchen aspirin EC 81 MG tablet Take 81 mg by mouth daily.    . Blood  Glucose Monitoring Suppl (ONETOUCH VERIO) w/Device KIT 1 each by Does not apply route in the morning, at noon, in the evening, and at bedtime. 1 kit 0  . Cholecalciferol (VITAMIN D3) 2000 units TABS Take 2,000 Units by mouth daily.    . citalopram (CELEXA) 40 MG tablet Take 40 mg by mouth daily.    Marland Kitchen DEXILANT 60 MG capsule TAKE 1 CAPSULE BY MOUTH ONCE DAILY. 90 capsule 3  . donepezil (ARICEPT) 5 MG tablet Take 5 mg by mouth at bedtime.    . eszopiclone (LUNESTA) 2 MG TABS tablet Take 2 mg by mouth at bedtime as needed for sleep. Take immediately before bedtime    . fenofibrate (TRICOR) 145 MG tablet Take 1 tablet (145 mg total) by mouth daily. 90 tablet 0  . gabapentin (NEURONTIN) 800 MG tablet Take 800 mg by mouth 3 (three) times daily.    Marland Kitchen glipiZIDE (GLUCOTROL XL) 5 MG 24 hr tablet Take 5 mg by mouth daily.    . Insulin Pen Needle (GLOBAL EASE INJECT PEN NEEDLES) 31G X 5 MM MISC USE UP TO 6 TIMES A DAY OR AS DIRECTED. 100 each 0  . Insulin Pen Needle (PEN NEEDLES) 31G X 6 MM MISC 1 each by Does not apply route 4 (four) times daily. 400 each 1  . lisinopril-hydrochlorothiazide (PRINZIDE,ZESTORETIC) 20-12.5 MG tablet Take 2 tablets by mouth daily.     . metoCLOPramide (REGLAN) 5 MG tablet Take 5 mg by mouth 3 (three) times daily.    . ondansetron (ZOFRAN) 8 MG tablet Take 1 tablet (8 mg total) by mouth every 8 (eight) hours as needed for nausea or vomiting. 30 tablet 0  . ONETOUCH VERIO test strip USE 1 STRIP TO CHECK GLUCOSE 4 TIMES DAILY. 200 strip 3  . pravastatin (PRAVACHOL) 40 MG tablet Take 40 mg by mouth daily.    Marland Kitchen PREMARIN vaginal cream SMARTSIG:1 Vaginal Every Night    . pyridOXINE (VITAMIN B-6) 100 MG tablet Take 100 mg by mouth daily.    . vitamin B-12 (CYANOCOBALAMIN) 500 MCG tablet Take 500 mcg by mouth daily.    . Continuous Blood Gluc Sensor (FREESTYLE LIBRE 14 DAY SENSOR) MISC Inject 1 each into the skin every 14 (fourteen) days. Use as directed. (Patient not taking: Reported on  11/16/2020) 2 each 6  . LINZESS 72 MCG capsule Take 72 mcg by mouth every morning.    . temazepam (RESTORIL) 30 MG capsule Take 30 mg by mouth at bedtime as needed.    . venlafaxine XR (EFFEXOR-XR) 150 MG 24 hr capsule Take 150 mg by mouth daily.     No current facility-administered medications on file prior to visit.    No facility-administered encounter medications on file as of 10/16/2018.    Allergies  Allergen Reactions  . Sulfa Antibiotics Other (See Comments)    Weakness and fatigued  VACCINATION STATUS:  There is no immunization history on file for this patient. Diabetes She presents for her follow-up diabetic visit. She has type 2 diabetes mellitus. Onset time: diagnosed at approximate age of 32. Her disease course has been worsening. Hypoglycemia symptoms include nervousness/anxiousness, sweats and tremors. Pertinent negatives for hypoglycemia include no confusion, headaches, mood changes or seizures. Associated symptoms include blurred vision, fatigue, foot paresthesias and weight loss. Pertinent negatives for diabetes include no chest pain, no polydipsia, no polyphagia and no polyuria. There are no hypoglycemic complications. Symptoms are stable. Diabetic complications include peripheral neuropathy. Risk factors for coronary artery disease include diabetes mellitus, dyslipidemia, hypertension, obesity, post-menopausal and sedentary lifestyle. Current diabetic treatment includes intensive insulin program and oral agent (monotherapy). She is compliant with treatment some of the time. Her weight is decreasing steadily. She is following a generally unhealthy diet. When asked about meal planning, she reported none. She has had a previous visit with a dietitian. She rarely participates in exercise. Her home blood glucose trend is fluctuating minimally. Her overall blood glucose range is >200 mg/dl. (She presents today with her meter and logs showing continued inconsistent monitoring  pattern and hyperglycemic overall.  Her previsit A1c (done at Dr. Janyth Pupa office on 09/29/20) was 9.1%, increasing from last visit of 8.2%.  She reports she is still struggling to eat 3 meals per day due to her gastroparesis.  She does have some rare hypoglycemia noted, usually associated with inadequate meal intake or timing.) An ACE inhibitor/angiotensin II receptor blocker is being taken. She does not see a podiatrist.Eye exam is current.  Hyperlipidemia This is a chronic problem. The current episode started more than 1 year ago. The problem is resistant. Recent lipid tests were reviewed and are high. Exacerbating diseases include diabetes and obesity. Factors aggravating her hyperlipidemia include thiazides. Pertinent negatives include no chest pain, leg pain, myalgias or shortness of breath. Current antihyperlipidemic treatment includes fibric acid derivatives and statins. The current treatment provides mild improvement of lipids. Compliance problems include adherence to exercise, adherence to diet and psychosocial issues.  Risk factors for coronary artery disease include diabetes mellitus, dyslipidemia, hypertension, obesity, post-menopausal and a sedentary lifestyle.  Hypertension This is a chronic problem. The current episode started more than 1 year ago. The problem has been waxing and waning since onset. The problem is uncontrolled. Associated symptoms include blurred vision and sweats. Pertinent negatives include no chest pain, headaches or shortness of breath. There are no associated agents to hypertension. Risk factors for coronary artery disease include diabetes mellitus, sedentary lifestyle, obesity, dyslipidemia and post-menopausal state. Past treatments include calcium channel blockers, ACE inhibitors and diuretics. The current treatment provides mild improvement. Compliance problems include diet, exercise and psychosocial issues.    Review of systems  Constitutional: + Minimally fluctuating  body weight,  current Body mass index is 34.46 kg/m. , + fatigue, no subjective hyperthermia, no subjective hypothermia Eyes: no blurry vision, no xerophthalmia ENT: no sore throat, no nodules palpated in throat, no dysphagia/odynophagia, no hoarseness Cardiovascular: no chest pain, no shortness of breath, no palpitations, no leg swelling Respiratory: no cough, no shortness of breath Gastrointestinal: mild intermittent nausea/vomiting/diarrhea associated with gastroparesis Musculoskeletal: no muscle/joint aches Skin: no rashes, no hyperemia Neurological: no tremors, + numbness/tingling to BLE, no dizziness Psychiatric: no depression, no anxiety   Objective:    BP (!) 180/100 (BP Location: Left Arm)   Pulse 99   Ht 5' 2"  (1.575 m)   Wt 188 lb 6.4 oz (85.5 kg)  BMI 34.46 kg/m   Wt Readings from Last 3 Encounters:  11/16/20 188 lb 6.4 oz (85.5 kg)  07/06/20 192 lb 12.8 oz (87.5 kg)  05/07/20 197 lb 3.2 oz (89.4 kg)   BP Readings from Last 3 Encounters:  11/16/20 (!) 180/100  07/06/20 (!) 144/81  05/07/20 (!) 155/86    Physical Exam- Limited  Constitutional:  Body mass index is 34.46 kg/m. , not in acute distress, reluctant, unconcerned state of mind Eyes:  EOMI, no exophthalmos Neck: Supple Cardiovascular: RRR, no murmers, rubs, or gallops, no edema Respiratory: Adequate breathing efforts, no crackles, rales, rhonchi, or wheezing Musculoskeletal: no gross deformities, strength intact in all four extremities, no gross restriction of joint movements Skin:  no rashes, no hyperemia Neurological: no tremor with outstretched hands    POCT ABI Results 11/16/20   Right ABI:  1.05      Left ABI:  1.07  Right leg systolic / diastolic: 165/53 mmHg Left leg systolic / diastolic: 748/27 mmHg  Arm systolic / diastolic: 078/675 mmHG  Detailed report will be scanned into patient chart.     Lipid Panel     Component Value Date/Time   CHOL 372 (A) 02/06/2019 0000   TRIG  668 (A) 02/06/2019 0000   HDL 56 02/06/2019 0000     Assessment & Plan:   1) Uncontrolled type 2 diabetes mellitus with complication, with long-term current use of insulin (HCC)  - Patient has currently uncontrolled symptomatic type 2 DM since  70 years of age.  She presents today with her meter and logs showing continued inconsistent monitoring pattern and hyperglycemic overall.  Her previsit A1c (done at Dr. Janyth Pupa office on 09/29/20) was 9.1%, increasing from last visit of 8.2%.  She reports she is still struggling to eat 3 meals per day due to her gastroparesis.  She does have some rare hypoglycemia noted, usually associated with inadequate meal intake or timing.   - Recent labs are reviewed.   Her diabetes is complicated by noncompliance/nonadherence, obesity and sedentary life and patient remains at a high risk for more acute and chronic complications of diabetes which include CAD, CVA, CKD, retinopathy, and neuropathy. These are all discussed in detail with the patient.  - Nutritional counseling repeated at each appointment due to patients tendency to fall back in to old habits.  - The patient admits there is a room for improvement in their diet and drink choices. -  Suggestion is made for the patient to avoid simple carbohydrates from their diet including Cakes, Sweet Desserts / Pastries, Ice Cream, Soda (diet and regular), Sweet Tea, Candies, Chips, Cookies, Sweet Pastries,  Store Bought Juices, Alcohol in Excess of  1-2 drinks a day, Artificial Sweeteners, Coffee Creamer, and "Sugar-free" Products. This will help patient to have stable blood glucose profile and potentially avoid unintended weight gain.   - I encouraged the patient to switch to  unprocessed or minimally processed complex starch and increased protein intake (animal or plant source), fruits, and vegetables.   - Patient is advised to stick to a routine mealtimes to eat 3 meals  a day and avoid unnecessary snacks ( to snack  only to correct hypoglycemia).  - I have approached patient with the following individualized plan to manage diabetes and patient agrees:   -she is struggling to achieve control of diabetes, likely deals with moderate cognitive deficit. Avoiding hypoglycemia is the #1 priority in her case.   -Given her persistent hyperglycemia, she is advised to increase her  U-500 to 55 units TID with meals if glucose is above 90 and she is eating.  We discussed the importance of eating routine meals.  She can continue Glipizide 5 mg XL daily with breakfast.   -She was educated not to treat for low blood glucose unless her blood sugar drops below 70 (to avoid her treating herself if she has symptoms of low glucose but blood sugar around 100 which she has been doing).  -She is encouraged to continue monitoring blood glucose at least 4 times per day, before meals and before bed, and call the clinic if she has readings less than 70 or greater than 300 for 3 tests in a row.  She would greatly benefit from CGM device.  It was prescribed for her on previous visit, but she never heard anything from her pharmacy to come get it.    -She is not a good candidate for incretin therapy due to her hypertriglyceridemia increasing her risk of pancreatitis.    2) Lipids/HPL:  Her previous lipid panel from 02/06/2019 shows uncontrolled triglycerides of 668.  She is advised to continue Pravastatin 40 mg po daily at bedtime and continue Fenofibrate 145 mg po daily.  Will recheck lipid panel prior to next visit.  3) Hypertension: -Her blood pressure is not controlled to target.  She is advised to continue Norvasc 5 mg po daily, Lisinopril-HCT 40-25 mg po daily and follow up with Dr. Manuella Ghazi regarding her meds.   She is advised to continue follow-up closely with Dr. Manuella Ghazi for primary care needs.     I spent 52 minutes in the care of the patient today including review of labs from [place], Lipids, Thyroid Function, Hematology (current and  previous including abstractions from other facilities); face-to-face time discussing  her blood glucose readings/logs, discussing hypoglycemia and hyperglycemia episodes and symptoms, medications doses, her options of short and long term treatment based on the latest standards of care / guidelines;  discussion about incorporating lifestyle medicine;  and documenting the encounter.    Please refer to Patient Instructions for Blood Glucose Monitoring and Insulin/Medications Dosing Guide"  in Carrie tab for additional information. Please  also refer to " Patient Self Inventory" in the Carrie  tab for reviewed elements of pertinent patient history.  Orlena Sheldon participated in the discussions, expressed understanding, and voiced agreement with the above plans.  All questions were answered to her satisfaction. she is encouraged to contact clinic should she have any questions or concerns prior to her return visit.   Follow up plan: Return in about 3 months (around 02/15/2021) for Diabetes follow up- A1c and urine micro in office, Previsit labs, Bring glucometer and logs.   Rayetta Pigg, [facility] [address] [place], [place] [address] Phone: [phone] Fax: [phone]

## 2020-11-24 ENCOUNTER — Encounter: Payer: Self-pay | Admitting: Internal Medicine

## 2020-12-13 DIAGNOSIS — E876 Hypokalemia: Secondary | ICD-10-CM | POA: Diagnosis not present

## 2020-12-13 DIAGNOSIS — I10 Essential (primary) hypertension: Secondary | ICD-10-CM | POA: Diagnosis not present

## 2020-12-13 DIAGNOSIS — Z743 Need for continuous supervision: Secondary | ICD-10-CM | POA: Diagnosis not present

## 2020-12-13 DIAGNOSIS — K429 Umbilical hernia without obstruction or gangrene: Secondary | ICD-10-CM | POA: Diagnosis not present

## 2020-12-13 DIAGNOSIS — K760 Fatty (change of) liver, not elsewhere classified: Secondary | ICD-10-CM | POA: Diagnosis not present

## 2020-12-13 DIAGNOSIS — R109 Unspecified abdominal pain: Secondary | ICD-10-CM | POA: Diagnosis not present

## 2020-12-13 DIAGNOSIS — R1010 Upper abdominal pain, unspecified: Secondary | ICD-10-CM | POA: Diagnosis not present

## 2020-12-13 DIAGNOSIS — K449 Diaphragmatic hernia without obstruction or gangrene: Secondary | ICD-10-CM | POA: Diagnosis not present

## 2020-12-13 DIAGNOSIS — E119 Type 2 diabetes mellitus without complications: Secondary | ICD-10-CM | POA: Diagnosis not present

## 2020-12-13 DIAGNOSIS — I700 Atherosclerosis of aorta: Secondary | ICD-10-CM | POA: Diagnosis not present

## 2020-12-13 DIAGNOSIS — R000 Tachycardia, unspecified: Secondary | ICD-10-CM | POA: Diagnosis not present

## 2020-12-13 DIAGNOSIS — I517 Cardiomegaly: Secondary | ICD-10-CM | POA: Diagnosis not present

## 2020-12-13 DIAGNOSIS — I4510 Unspecified right bundle-branch block: Secondary | ICD-10-CM | POA: Diagnosis not present

## 2020-12-13 DIAGNOSIS — R1084 Generalized abdominal pain: Secondary | ICD-10-CM | POA: Diagnosis not present

## 2020-12-13 DIAGNOSIS — R110 Nausea: Secondary | ICD-10-CM | POA: Diagnosis not present

## 2021-01-06 DIAGNOSIS — I10 Essential (primary) hypertension: Secondary | ICD-10-CM | POA: Diagnosis not present

## 2021-01-06 DIAGNOSIS — Z299 Encounter for prophylactic measures, unspecified: Secondary | ICD-10-CM | POA: Diagnosis not present

## 2021-01-06 DIAGNOSIS — Z713 Dietary counseling and surveillance: Secondary | ICD-10-CM | POA: Diagnosis not present

## 2021-01-12 ENCOUNTER — Other Ambulatory Visit: Payer: Self-pay | Admitting: "Endocrinology

## 2021-01-20 ENCOUNTER — Ambulatory Visit: Payer: Medicare Other | Admitting: Gastroenterology

## 2021-02-01 ENCOUNTER — Other Ambulatory Visit: Payer: Self-pay | Admitting: Gastroenterology

## 2021-02-08 ENCOUNTER — Other Ambulatory Visit: Payer: Self-pay | Admitting: "Endocrinology

## 2021-02-11 DIAGNOSIS — E876 Hypokalemia: Secondary | ICD-10-CM | POA: Diagnosis not present

## 2021-02-11 DIAGNOSIS — E1165 Type 2 diabetes mellitus with hyperglycemia: Secondary | ICD-10-CM | POA: Diagnosis not present

## 2021-02-11 DIAGNOSIS — I10 Essential (primary) hypertension: Secondary | ICD-10-CM | POA: Diagnosis not present

## 2021-02-11 DIAGNOSIS — R350 Frequency of micturition: Secondary | ICD-10-CM | POA: Diagnosis not present

## 2021-02-11 DIAGNOSIS — Z299 Encounter for prophylactic measures, unspecified: Secondary | ICD-10-CM | POA: Diagnosis not present

## 2021-02-16 ENCOUNTER — Ambulatory Visit: Payer: Medicare Other | Admitting: Nurse Practitioner

## 2021-02-16 DIAGNOSIS — E1165 Type 2 diabetes mellitus with hyperglycemia: Secondary | ICD-10-CM

## 2021-02-16 DIAGNOSIS — I10 Essential (primary) hypertension: Secondary | ICD-10-CM

## 2021-02-16 DIAGNOSIS — Z9119 Patient's noncompliance with other medical treatment and regimen: Secondary | ICD-10-CM

## 2021-02-16 DIAGNOSIS — E782 Mixed hyperlipidemia: Secondary | ICD-10-CM

## 2021-02-17 DIAGNOSIS — E118 Type 2 diabetes mellitus with unspecified complications: Secondary | ICD-10-CM | POA: Diagnosis not present

## 2021-02-17 DIAGNOSIS — Z794 Long term (current) use of insulin: Secondary | ICD-10-CM | POA: Diagnosis not present

## 2021-02-17 DIAGNOSIS — E559 Vitamin D deficiency, unspecified: Secondary | ICD-10-CM | POA: Diagnosis not present

## 2021-02-17 DIAGNOSIS — E11649 Type 2 diabetes mellitus with hypoglycemia without coma: Secondary | ICD-10-CM | POA: Diagnosis not present

## 2021-02-17 LAB — LIPID PANEL
Cholesterol: 383 — AB (ref 0–200)
HDL: 61 (ref 35–70)
LDL Cholesterol: 247
Triglycerides: 373 — AB (ref 40–160)

## 2021-02-17 LAB — HEPATIC FUNCTION PANEL
ALT: 27 (ref 7–35)
AST: 18 (ref 13–35)
Bilirubin, Total: 0.2

## 2021-02-17 LAB — BASIC METABOLIC PANEL
BUN: 17 (ref 4–21)
CO2: 29 — AB (ref 13–22)
Chloride: 96 — AB (ref 99–108)
Creatinine: 0.8 (ref 0.5–1.1)
Glucose: 294
Potassium: 3.6 (ref 3.4–5.3)
Sodium: 134 — AB (ref 137–147)

## 2021-02-17 LAB — TSH: TSH: 0.8 (ref 0.41–5.90)

## 2021-02-17 LAB — COMPREHENSIVE METABOLIC PANEL
Albumin: 3.8 (ref 3.5–5.0)
Calcium: 9.5 (ref 8.7–10.7)

## 2021-02-22 NOTE — Progress Notes (Signed)
Referring Provider: Monico Blitz, MD Primary Care Physician:  Monico Blitz, MD Primary GI Physician: Dr. Gala Romney  Chief Complaint  Patient presents with   Nausea    Everyday   diarrhea/constipation    Was having diarrhea everyday for 2 months but now feels constipated. Went to ED 5/22   Gastroesophageal Reflux    HPI:   Carrie Turner is a 70 y.o. female  with a history of GERD, constipation, nausea, and chronic epigastric pain.  Colonoscopy up-to-date in 2019 with single tubular adenoma, internal hemorrhoids, random colon biopsies negative.  Recommended 5-year surveillance. EGD in 2019 with erosive reflux esophagitis, empiric dilation of esophagus with dysphagia, hiatal hernia, small bowel biopsies negative for celiac.  History of poorly controlled diabetes which has been felt to be contributing to her nausea.  GES completed on 08/27/2019 revealed borderline delayed gastric emptying; 87% emptying at 4 hours (normal greater than 90%). RUQ [place] 10/03/19 without gallstones or wall thickening, CBD slightly prominent at 39m.  HIDA wnl in May 2021. She arranged visit with general surgery and underwent cholecystectomy in June 2021.  She was last seen in our office 07/06/2020.  She reported 3-day spell watery diarrhea November.  Now with alternating constipation and diarrhea using Motegrity a couple days a week and Imodium a couple days a week.  No overt GI bleeding.  She is eating 1 or 2 small meals daily and sticking to softer foods as she felt these adjusted better.  PCP had started Reglan for nausea as needed.  She was taking this 3-4 mornings a week 3-4 nights a week and felt this was helpful and allow her to eat better.  No vomiting in quite some time.  Continues with early satiety.  Epigastric pain was much improved.  GERD continues to be well controlled on Dexilant.  Continued to have some trouble with controlling her blood sugars.  Recommended starting Reglan 5 mg once every morning, continue  Zofran as needed, gastroparesis diet, continue Dexilant, follow-up with endocrinology on diabetes.  Requested progress report in 4 weeks on nausea.  Queried whether intermittent diarrhea may be related to bile salt diarrhea and suspected Motegrity may now be too strong.  Advised to stop Motegrity and monitor bowel function, use MiraLAX as needed and let me know if diarrhea returns and persists.  Discussed low-fat diet.  No progress report was received.  Today: States she continues to wake up with nausea, but no vomiting.  She takes Reglan in the morning and nausea resolves.  No return of nausea throughout the day.  She also takes Reglan before dinner.  States she is eating primarily 1 meal a day which is dinner.  Occasionally, she eats breakfast.  Dinner may be one of the following: Cheeseburger, hot dog, TV dinners.  Also states that sweets are her weakness.  Reports that she eats chicken, this does not seem to cause any problems for her.  She does not eat dinner after 8 PM.  May go to bed between 3:58 AM and gets up after 12 PM.  Reflux seems to be well controlled on Dexilant 60 mg daily.  A couple weeks ago, she had bad reflux and required Tums twice a day.  Back in May, she also had a lot of reflux and nausea and was seen in the emergency room.  States they told her she had a bad case of acid reflux. She has lost some weight. Some intentional, some due to feeling so poorly in May.  Noticed foods getting stuck in her mid to lower chest x1 month.  Worse with meats.  Drinking liquids will push items down.  No regurgitation.  States she kept loose stools for a couple of months after she saw me in December.  Over the last few weeks, bowel seemed to have returned to normal with 1 soft, formed BM daily.  She will use Linzess as needed, but has not needed it recently.  Chronic intermittent toilet tissue hematochezia, once every few months.  No melena.  Intermittent upper abdominal pain prior to a bowel  movement.  Not very frequent.  Improves after bowel movement.   Blood sugars continue to run high. This morning was 251. Frequently in the 200s.  Couple weeks ago, they were in 300s.  She has an appointment with Dr. Dorris Fetch tomorrow.  No NSAIDs aside from 81 mg aspirin.  Past Medical History:  Diagnosis Date   Anxiety    Arthritis    COPD (chronic obstructive pulmonary disease) ([place])    Delayed gastric emptying    GES completed on 08/27/2019 revealed borderline delayed gastric emptying; 87% emptying at 4 hours (normal greater than 90%)   Dementia ([place]) 09/30/2019   per patient, early dementia diagnosed after seeing PCP recently   Depression    Diabetes mellitus, type II ([place])    GERD (gastroesophageal reflux disease)    Glaucoma    Hyperlipidemia    Hypertension    Migraines    Neuropathy    Seizures ([place])    seizure was from ETOH, "a long time ago", no med and no recurrance   Vitamin D deficiency     Past Surgical History:  Procedure Laterality Date   ABDOMINAL HYSTERECTOMY     CATARACT EXTRACTION W/PHACO Left 07/24/2019   Procedure: CATARACT EXTRACTION PHACO AND INTRAOCULAR LENS PLACEMENT LEFT EYE  (CDE: 5.60);  Surgeon: Baruch Goldmann, MD;  Location: [facility];  Service: Ophthalmology;  Laterality: Left;   CATARACT EXTRACTION W/PHACO Right 08/19/2019   Procedure: CATARACT EXTRACTION PHACO AND INTRAOCULAR LENS PLACEMENT RIGHT EYE;  Surgeon: Baruch Goldmann, MD;  Location: [facility];  Service: Ophthalmology;  Laterality: Right;  CDE: 8.83   CHOLECYSTECTOMY N/A 12/25/2019   Procedure: LAPAROSCOPIC CHOLECYSTECTOMY;  Surgeon: Aviva Signs, MD;  Location: [facility];  Service: General;  Laterality: N/A;   COLONOSCOPY  11/2015   Dr. Britta Mccreedy: sessile polyp removed (benign). advised repeat colonoscopy in 5 years.    COLONOSCOPY WITH PROPOFOL N/A 10/26/2017   RMR: Tubular adenoma removed.  Random colon biopsies negative.  Nonbleeding internal hemorrhoids.  Next colonoscopy in 5 years.   ESOPHAGEAL  DILATION  10/26/2017   Procedure: ESOPHAGEAL DILATION;  Surgeon: Daneil Dolin, MD;  Location: [facility];  Service: Endoscopy;;   ESOPHAGOGASTRODUODENOSCOPY  11/2015   Dr. Britta Mccreedy: hiatal hernia   ESOPHAGOGASTRODUODENOSCOPY (EGD) WITH PROPOFOL N/A 10/26/2017   RMR: Erosive reflux esophagitis, hiatal hernia.  Small bowel biopsies negative.   INSERTION OF ANTERIOR SEGMENT AQUEOUS DRAINAGE DEVICE (ISTENT) Left 07/24/2019   Procedure: INSERTION OF ANTERIOR SEGMENT AQUEOUS DRAINAGE DEVICE (ISTENT) LEFT EYE;  Surgeon: Baruch Goldmann, MD;  Location: [facility];  Service: Ophthalmology;  Laterality: Left;   INSERTION OF ANTERIOR SEGMENT AQUEOUS DRAINAGE DEVICE (ISTENT) Right 08/19/2019   Procedure: INSERTION OF ANTERIOR SEGMENT AQUEOUS DRAINAGE DEVICE (ISTENT) RIGHT EYE;  Surgeon: Baruch Goldmann, MD;  Location: [facility];  Service: Ophthalmology;  Laterality: Right;   KNEE ARTHROSCOPY Right    ORIF ANKLE FRACTURE Right     Current Outpatient Medications  Medication Sig Dispense Refill   albuterol (VENTOLIN HFA) 108 (90 Base) MCG/ACT inhaler Inhale 1-2 puffs into the lungs every 6 (six) hours as needed for wheezing or shortness of breath.     amLODipine (NORVASC) 5 MG tablet Take 5 mg by mouth daily.      aspirin EC 81 MG tablet Take 81 mg by mouth daily.     Blood Glucose Monitoring Suppl (ONETOUCH VERIO) w/Device KIT 1 each by Does not apply route in the morning, at noon, in the evening, and at bedtime. 1 kit 0   Cholecalciferol (VITAMIN D3) 2000 units TABS Take 2,000 Units by mouth daily.     citalopram (CELEXA) 40 MG tablet Take 40 mg by mouth daily.     Continuous Blood Gluc Sensor (FREESTYLE LIBRE 14 DAY SENSOR) MISC Inject 1 each into the skin every 14 (fourteen) days. Use as directed. 2 each 6   dexlansoprazole (DEXILANT) 60 MG capsule TAKE 1 CAPSULE BY MOUTH ONCE DAILY. 90 capsule 3   donepezil (ARICEPT) 5 MG tablet Take 5 mg by mouth at bedtime.     eszopiclone (LUNESTA) 2 MG TABS tablet Take 2 mg  by mouth at bedtime as needed for sleep. Take immediately before bedtime     fenofibrate (TRICOR) 145 MG tablet Take 1 tablet (145 mg total) by mouth daily. 90 tablet 0   gabapentin (NEURONTIN) 800 MG tablet Take 800 mg by mouth 3 (three) times daily.     glipiZIDE (GLUCOTROL XL) 5 MG 24 hr tablet Take 5 mg by mouth daily.     HUMULIN R U-500 KWIKPEN 500 UNIT/ML kwikpen INJECT 55 UNITS INTO THE SKIN 3 TIMES A DAY WITH MEALS. 30 mL 0   Insulin Pen Needle (GLOBAL EASE INJECT PEN NEEDLES) 31G X 5 MM MISC USE 3 TIMES A DAY OR AS DIRECTED. 100 each 2   Insulin Pen Needle (PEN NEEDLES) 31G X 6 MM MISC 1 each by Does not apply route 4 (four) times daily. 400 each 1   LINZESS 72 MCG capsule Take 72 mcg by mouth every morning. As needed     lisinopril-hydrochlorothiazide (PRINZIDE,ZESTORETIC) 20-12.5 MG tablet Take 2 tablets by mouth daily.      metoCLOPramide (REGLAN) 5 MG tablet Take 5 mg by mouth in the morning and at bedtime.     ONETOUCH VERIO test strip USE 1 STRIP TO CHECK GLUCOSE 4 TIMES DAILY. 200 strip 3   pravastatin (PRAVACHOL) 40 MG tablet Take 40 mg by mouth daily.     PREMARIN vaginal cream SMARTSIG:1 Vaginal Every Night     pyridOXINE (VITAMIN B-6) 100 MG tablet Take 100 mg by mouth daily.     temazepam (RESTORIL) 30 MG capsule Take 30 mg by mouth at bedtime as needed.     venlafaxine XR (EFFEXOR-XR) 150 MG 24 hr capsule Take 150 mg by mouth daily.     vitamin B-12 (CYANOCOBALAMIN) 500 MCG tablet Take 500 mcg by mouth daily.     ondansetron (ZOFRAN) 8 MG tablet Take 1 tablet (8 mg total) by mouth every 8 (eight) hours as needed for nausea or vomiting. (Patient not taking: Reported on 02/24/2021) 30 tablet 0   No current facility-administered medications for this visit.    Allergies as of 02/24/2021 - Review Complete 02/24/2021  Allergen Reaction Noted   Sulfa antibiotics Other (See Comments) 03/04/2016    Family History  Problem Relation Age of Onset   Hypertension Mother    Colon  cancer Mother  diagnosed early 41s   Cerebral aneurysm Mother    Alcohol abuse Father    Pancreatic cancer Sister        deceased in 73s   Colon cancer Sister        diagnosed 57 y/o   Stroke Brother    Cancer Maternal Grandmother    Cancer Maternal Grandfather    Cancer Paternal Grandmother    Other Paternal Grandfather    Other Brother     Social History   Socioeconomic History   Marital status: Single    Spouse name: Not on file   Number of children: Not on file   Years of education: Not on file   Highest education level: Not on file  Occupational History   Not on file  Tobacco Use   Smoking status: Never   Smokeless tobacco: Never  Vaping Use   Vaping Use: Never used  Substance and Sexual Activity   Alcohol use: No   Drug use: No   Sexual activity: Never  Other Topics Concern   Not on file  Social History Narrative   Not on file   Social Determinants of Health   Financial Resource Strain: Not on file  Food Insecurity: Not on file  Transportation Needs: Not on file  Physical Activity: Not on file  Stress: Not on file  Social Connections: Not on file    Review of Systems: Gen: Denies fever, chills, cold or flulike symptoms, presyncope, syncope. CV: Denies chest pain, palpitations.  Resp: Denies dyspnea or cough. GI: See HPI Psych: Denies depression, anxiety Heme: See HPI  Physical Exam: BP (!) 167/81   Pulse 74   Temp (!) 97.5 F (36.4 C)   Ht _0  (1.575 m)   Wt 177 lb 12.8 oz (80.6 kg)   BMI 32.52 kg/m  General:   Alert and oriented. No distress noted. Pleasant and cooperative.  Head:  Normocephalic and atraumatic. Eyes:  Conjuctiva clear without scleral icterus. Heart:  S1, S2 present without murmurs appreciated. Lungs:  Clear to auscultation bilaterally. No wheezes, rales, or rhonchi. No distress.  Abdomen:  +BS, soft, and non-distended. Mild TTP in the epigastric area. No rebound or guarding. No HSM or masses noted.  Msk:   Symmetrical without gross deformities. Normal posture. Extremities:  Without edema. Neurologic:  Alert and  oriented x4 Psych:  Normal mood and affect.    Assessment: 70 year old female with history of GERD, nausea, chronic epigastric pain, borderline delayed gastric emptying, poorly controlled diabetes, and constipation who developed alternating constipation and diarrhea postcholecystectomy in June 2021 presenting today for follow-up.  Nausea without vomiting: Chronic.  Continues to wake with morning nausea, but symptoms resolve with Reglan which she is currently taking twice daily without side effects.  Overall, suspect symptoms are likely secondary to gastroparesis in the setting of poorly controlled diabetes and poor dietary habits. We had a long discussion today about following a gastroparesis diet including low-fat and low fiber diet as well as eating 4-6 small meals daily rather than 1 evening meal.  We also discussed the importance of better control of diabetes and avoiding sweets, which she states is her weakness, and sugary beverages. We will continue her on her current medications for now. She has an appointment with Dr. Dorris Fetch (endocrinologist) tomorrow.  GERD: Fairly well controlled on Dexilant 60 mg daily.  She has had a couple of flares of symptoms since I saw her 7+ months ago.  This may be influenced by underlying gastroparesis addressed  above.  We will continue her on her current medications for now.   Dysphagia: Solid food dysphagia x1 month without regurgitation.  She has history of dysphagia s/p empiric dilation in 2019 with good clinical improvement.  Also with history of reflux esophagitis.  Differentials include esophagitis, esophageal web, ring, or stricture.  We will proceed with EGD in the near future for further evaluation and therapeutic intervention.  Alternating constipation diarrhea: Diarrhea component has now resolved.  Suspect this was likely secondary to bile salt  diarrhea s/p cholecystectomy in June 2021.  Now with soft, formed BMs daily using Linzess 72 mcg as needed.  Occasional toilet tissue hematochezia in the setting of known hemorrhoids.  Colonoscopy is up-to-date in 2019, due for repeat in 2024.  We will continue her current medications.  Chronic epigastric pain: Likely secondary to chronic GERD and gastroparesis.  May very well have some mild chronic gastritis.  As she reports pain comes on prior to bowel movements at times and improves or after, could have a component of IBS as well.  As the symptoms are mild and fairly infrequent, will hold off on additional medications for now as antispasmodics for IBS may worsen her gastroparesis symptoms and underlying constipation. Otherwise, we will continue her current medication regimen for GERD and gastroparesis as per above.   Plan:  Proceed with EGD with propofol with possible dilation in the near future with Dr. Gala Romney. ASA III No a.m. diabetes medications morning of procedure. Continue Reglan 5 mg twice daily before breakfast and supper.  Counseled again on possible side effects to monitor for including tardive dyskinesia.  Advised to stop medication if this occurs and let us know immediately. Continue Dexilant 60 mg daily. Follow gastroparesis diet.  Counseled extensively on this. Keep close follow-up with endocrinology for diabetes management. Continue Linzess 72 mcg as needed and let me know of any worsening constipation. Follow-up after EGD.  Advised to call sooner if questions or concerns prior to her next visit.   Aliene Altes, PA-C [facility] 02/24/2021

## 2021-02-23 ENCOUNTER — Other Ambulatory Visit: Payer: Self-pay | Admitting: "Endocrinology

## 2021-02-24 ENCOUNTER — Telehealth: Payer: Self-pay | Admitting: *Deleted

## 2021-02-24 ENCOUNTER — Encounter: Payer: Self-pay | Admitting: Gastroenterology

## 2021-02-24 ENCOUNTER — Ambulatory Visit (INDEPENDENT_AMBULATORY_CARE_PROVIDER_SITE_OTHER): Payer: Medicare Other | Admitting: Gastroenterology

## 2021-02-24 ENCOUNTER — Encounter: Payer: Self-pay | Admitting: *Deleted

## 2021-02-24 ENCOUNTER — Other Ambulatory Visit: Payer: Self-pay

## 2021-02-24 VITALS — BP 167/81 | HR 74 | Temp 97.5°F | Ht 62.0 in | Wt 177.8 lb

## 2021-02-24 DIAGNOSIS — R112 Nausea with vomiting, unspecified: Secondary | ICD-10-CM

## 2021-02-24 DIAGNOSIS — R1013 Epigastric pain: Secondary | ICD-10-CM

## 2021-02-24 DIAGNOSIS — K219 Gastro-esophageal reflux disease without esophagitis: Secondary | ICD-10-CM

## 2021-02-24 DIAGNOSIS — R1310 Dysphagia, unspecified: Secondary | ICD-10-CM

## 2021-02-24 DIAGNOSIS — R198 Other specified symptoms and signs involving the digestive system and abdomen: Secondary | ICD-10-CM | POA: Diagnosis not present

## 2021-02-24 NOTE — Telephone Encounter (Signed)
Patient returned call. Has been scheduled for 9/15 at 8:45am. Aware will mail prep instructions with pre-op appt.

## 2021-02-24 NOTE — Patient Instructions (Addendum)
We will arrange for you to have an upper endoscopy with possible dilation of your esophagus in the near future with Dr. Gala Romney. Morning of your procedure, do not take any diabetes medications.  Continue taking Reglan 5 mg twice daily before breakfast and supper.  Continue Dexilant 60 mg daily.  As we discussed, we need to focus on dietary changes to help manage your symptoms as well as getting better control of your diabetes: 1.  Follow a low-fat diet.  Avoid fried, fatty, greasy foods.  All meats should be lean (poultry or fish) and baked, boiled, or broiled. 2.  Limit raw fruits and vegetables. 3.  Avoid sweets.  4.  Avoid sugary beverages.  Keep your follow-up with Dr. Dorris Fetch.  It is very important that she can benefit from with your diabetes as this is influencing your ongoing nausea/slowed stomach emptying.  Monitor for worsening constipation and let me know if this occurs. You can continue Linzess as needed.   We will plan to see back in the office after your upper endoscopy.  Do not hesitate to call if you have questions or concerns prior to your next visit.  You can ask to speak with a nurse who will relay the information to me.  It was great to see you today!  Aliene Altes, PA-C [facility]

## 2021-02-24 NOTE — Telephone Encounter (Signed)
LMOVM to call back to schedule EGD +/-DIL with propofol, Dr. Gala Romney, ASA 3

## 2021-02-24 NOTE — Patient Instructions (Signed)

## 2021-02-25 ENCOUNTER — Ambulatory Visit (INDEPENDENT_AMBULATORY_CARE_PROVIDER_SITE_OTHER): Payer: Medicare Other | Admitting: Nurse Practitioner

## 2021-02-25 ENCOUNTER — Encounter: Payer: Self-pay | Admitting: Nurse Practitioner

## 2021-02-25 VITALS — BP 165/96 | HR 93 | Ht 62.0 in | Wt 179.0 lb

## 2021-02-25 DIAGNOSIS — Z9119 Patient's noncompliance with other medical treatment and regimen: Secondary | ICD-10-CM

## 2021-02-25 DIAGNOSIS — Z794 Long term (current) use of insulin: Secondary | ICD-10-CM | POA: Diagnosis not present

## 2021-02-25 DIAGNOSIS — E559 Vitamin D deficiency, unspecified: Secondary | ICD-10-CM | POA: Diagnosis not present

## 2021-02-25 DIAGNOSIS — Z91199 Patient's noncompliance with other medical treatment and regimen due to unspecified reason: Secondary | ICD-10-CM

## 2021-02-25 DIAGNOSIS — I10 Essential (primary) hypertension: Secondary | ICD-10-CM

## 2021-02-25 DIAGNOSIS — E118 Type 2 diabetes mellitus with unspecified complications: Secondary | ICD-10-CM

## 2021-02-25 DIAGNOSIS — IMO0002 Reserved for concepts with insufficient information to code with codable children: Secondary | ICD-10-CM

## 2021-02-25 DIAGNOSIS — E782 Mixed hyperlipidemia: Secondary | ICD-10-CM

## 2021-02-25 DIAGNOSIS — E1165 Type 2 diabetes mellitus with hyperglycemia: Secondary | ICD-10-CM

## 2021-02-25 LAB — POCT GLYCOSYLATED HEMOGLOBIN (HGB A1C): Hemoglobin A1C: 9.2 % — AB (ref 4.0–5.6)

## 2021-02-25 LAB — POCT UA - MICROALBUMIN
Creatinine, POC: 50 mg/dL
Microalbumin Ur, POC: 30 mg/L

## 2021-02-25 MED ORDER — FENOFIBRATE 145 MG PO TABS: 145.0000 mg | ORAL_TABLET | Freq: Every day | ORAL | 3 refills | Status: DC

## 2021-02-25 MED ORDER — PRAVASTATIN SODIUM 40 MG PO TABS
40.0000 mg | ORAL_TABLET | Freq: Every day | ORAL | 3 refills | Status: DC
Start: 2021-02-25 — End: 2023-04-04

## 2021-02-25 NOTE — Telephone Encounter (Signed)
PA approved via [facility] for EGD/DIL. Auth# [id number], DOS Apr 08, 2021 - Jul 07, 2021

## 2021-02-25 NOTE — Progress Notes (Signed)
02/25/2021  Endocrinology follow-up note  Subjective:    Patient ID: Carrie Turner, female    DOB: [birth year].  She is being seen in follow-up  for management of diabetes requested by Monico Blitz, MD  Past Medical History:  Diagnosis Date   Anxiety    Arthritis    COPD (chronic obstructive pulmonary disease) ([place])    Delayed gastric emptying    GES completed on 08/27/2019 revealed borderline delayed gastric emptying; 87% emptying at 4 hours (normal greater than 90%)   Dementia ([place]) 09/30/2019   per patient, early dementia diagnosed after seeing PCP recently   Depression    Diabetes mellitus, type II ([place])    GERD (gastroesophageal reflux disease)    Glaucoma    Hyperlipidemia    Hypertension    Migraines    Neuropathy    Seizures ([place])    seizure was from ETOH, "a long time ago", no med and no recurrance   Vitamin D deficiency     Past Surgical History:  Procedure Laterality Date   ABDOMINAL HYSTERECTOMY     CATARACT EXTRACTION W/PHACO Left 07/24/2019   Procedure: CATARACT EXTRACTION PHACO AND INTRAOCULAR LENS PLACEMENT LEFT EYE  (CDE: 5.60);  Surgeon: Baruch Goldmann, MD;  Location: [facility];  Service: Ophthalmology;  Laterality: Left;   CATARACT EXTRACTION W/PHACO Right 08/19/2019   Procedure: CATARACT EXTRACTION PHACO AND INTRAOCULAR LENS PLACEMENT RIGHT EYE;  Surgeon: Baruch Goldmann, MD;  Location: [facility];  Service: Ophthalmology;  Laterality: Right;  CDE: 8.83   CHOLECYSTECTOMY N/A 12/25/2019   Procedure: LAPAROSCOPIC CHOLECYSTECTOMY;  Surgeon: Aviva Signs, MD;  Location: [facility];  Service: General;  Laterality: N/A;   COLONOSCOPY  11/2015   Dr. Britta Mccreedy: sessile polyp removed (benign). advised repeat colonoscopy in 5 years.    COLONOSCOPY WITH PROPOFOL N/A 10/26/2017   RMR: Tubular adenoma removed.  Random colon biopsies negative.  Nonbleeding internal hemorrhoids.  Next colonoscopy in 5 years.   ESOPHAGEAL DILATION  10/26/2017   Procedure: ESOPHAGEAL DILATION;  Surgeon: Daneil Dolin, MD;  Location: [facility];  Service: Endoscopy;;   ESOPHAGOGASTRODUODENOSCOPY  11/2015   Dr. Britta Mccreedy: hiatal hernia   ESOPHAGOGASTRODUODENOSCOPY (EGD) WITH PROPOFOL N/A 10/26/2017   RMR: Erosive reflux esophagitis, hiatal hernia.  Small bowel biopsies negative.   INSERTION OF ANTERIOR SEGMENT AQUEOUS DRAINAGE DEVICE (ISTENT) Left 07/24/2019   Procedure: INSERTION OF ANTERIOR SEGMENT AQUEOUS DRAINAGE DEVICE (ISTENT) LEFT EYE;  Surgeon: Baruch Goldmann, MD;  Location: [facility];  Service: Ophthalmology;  Laterality: Left;   INSERTION OF ANTERIOR SEGMENT AQUEOUS DRAINAGE DEVICE (ISTENT) Right 08/19/2019   Procedure: INSERTION OF ANTERIOR SEGMENT AQUEOUS DRAINAGE DEVICE (ISTENT) RIGHT EYE;  Surgeon: Baruch Goldmann, MD;  Location: [facility];  Service: Ophthalmology;  Laterality: Right;   KNEE ARTHROSCOPY Right    ORIF ANKLE FRACTURE Right      Social History   Socioeconomic History   Marital status: Single    Spouse name: Not on file   Number of children: Not on file   Years of education: Not on file   Highest education level: Not on file  Occupational History   Not on file  Tobacco Use   Smoking status: Never   Smokeless tobacco: Never  Vaping Use   Vaping Use: Never used  Substance and Sexual Activity   Alcohol use: No   Drug use: No   Sexual activity: Never  Other Topics Concern   Not on file  Social History Narrative   Not on file  Social Determinants of Health   Financial Resource Strain: Not on file  Food Insecurity: Not on file  Transportation Needs: Not on file  Physical Activity: Not on file  Stress: Not on file  Social Connections: Not on file  Intimate Partner Violence: Not on file    Current Outpatient Medications on File Prior to Visit  Medication Sig Dispense Refill   albuterol (VENTOLIN HFA) 108 (90 Base) MCG/ACT inhaler Inhale 1-2 puffs into the lungs every 6 (six) hours as needed for wheezing or shortness of breath.     amLODipine (NORVASC) 5 MG  tablet Take 5 mg by mouth daily.      aspirin EC 81 MG tablet Take 81 mg by mouth daily.     Blood Glucose Monitoring Suppl (ONETOUCH VERIO) w/Device KIT 1 each by Does not apply route in the morning, at noon, in the evening, and at bedtime. 1 kit 0   Cholecalciferol (VITAMIN D3) 2000 units TABS Take 2,000 Units by mouth daily.     citalopram (CELEXA) 40 MG tablet Take 40 mg by mouth daily.     Continuous Blood Gluc Sensor (FREESTYLE LIBRE 14 DAY SENSOR) MISC Inject 1 each into the skin every 14 (fourteen) days. Use as directed. 2 each 6   dexlansoprazole (DEXILANT) 60 MG capsule TAKE 1 CAPSULE BY MOUTH ONCE DAILY. 90 capsule 3   donepezil (ARICEPT) 5 MG tablet Take 5 mg by mouth at bedtime.     eszopiclone (LUNESTA) 2 MG TABS tablet Take 2 mg by mouth at bedtime as needed for sleep. Take immediately before bedtime     gabapentin (NEURONTIN) 800 MG tablet Take 800 mg by mouth 3 (three) times daily.     glipiZIDE (GLUCOTROL XL) 5 MG 24 hr tablet Take 5 mg by mouth daily.     HUMULIN R U-500 KWIKPEN 500 UNIT/ML kwikpen INJECT 55 UNITS INTO THE SKIN 3 TIMES A DAY WITH MEALS. 30 mL 0   Insulin Pen Needle (GLOBAL EASE INJECT PEN NEEDLES) 31G X 5 MM MISC USE 3 TIMES A DAY OR AS DIRECTED. 100 each 2   Insulin Pen Needle (PEN NEEDLES) 31G X 6 MM MISC 1 each by Does not apply route 4 (four) times daily. 400 each 1   LINZESS 72 MCG capsule Take 72 mcg by mouth every morning. As needed     lisinopril-hydrochlorothiazide (PRINZIDE,ZESTORETIC) 20-12.5 MG tablet Take 2 tablets by mouth daily.      metoCLOPramide (REGLAN) 5 MG tablet Take 5 mg by mouth in the morning and at bedtime.     ondansetron (ZOFRAN) 8 MG tablet Take 1 tablet (8 mg total) by mouth every 8 (eight) hours as needed for nausea or vomiting. (Patient not taking: Reported on 02/24/2021) 30 tablet 0   ONETOUCH VERIO test strip USE 1 STRIP TO CHECK GLUCOSE 4 TIMES DAILY. 200 strip 3   PREMARIN vaginal cream SMARTSIG:1 Vaginal Every Night      pyridOXINE (VITAMIN B-6) 100 MG tablet Take 100 mg by mouth daily.     temazepam (RESTORIL) 30 MG capsule Take 30 mg by mouth at bedtime as needed.     venlafaxine XR (EFFEXOR-XR) 150 MG 24 hr capsule Take 150 mg by mouth daily.     vitamin B-12 (CYANOCOBALAMIN) 500 MCG tablet Take 500 mcg by mouth daily.     No current facility-administered medications on file prior to visit.    No facility-administered encounter medications on file as of 10/16/2018.    Allergies  Allergen  Reactions   Sulfa Antibiotics Other (See Comments)    Weakness and fatigued      Diabetes She presents for her follow-up diabetic visit. She has type 2 diabetes mellitus. Onset time: diagnosed at approximate age of 11. Her disease course has been worsening. There are no hypoglycemic associated symptoms. Pertinent negatives for hypoglycemia include no confusion, headaches, mood changes or seizures. Associated symptoms include blurred vision, fatigue, foot paresthesias, polydipsia, polyuria and weight loss. Pertinent negatives for diabetes include no chest pain and no polyphagia. There are no hypoglycemic complications. Symptoms are stable. Diabetic complications include peripheral neuropathy. Risk factors for coronary artery disease include diabetes mellitus, dyslipidemia, hypertension, obesity, post-menopausal and sedentary lifestyle. Current diabetic treatment includes intensive insulin program and oral agent (monotherapy). She is compliant with treatment some of the time. Her weight is decreasing steadily. She is following a generally unhealthy diet. When asked about meal planning, she reported none. She has had a previous visit with a dietitian. She rarely participates in exercise. Her overall blood glucose range is >200 mg/dl. (She presents today with her meter and logs with very little data.  She has not been checking her glucose as often as she was asked to.  Her POCT A1c today is 9.2%, increasing from last visit of 9.1%.   She admits she has not been eating right, usually only eats once a day due to her gastroparesis.  Therefore, she has not been utilizing her U500 properly.  She denies any hypoglycemia.  Analysis of her meter shows 7-day average of 250 with 5 readings; 14-day average of 292 with 11 readings; 30-day average of 288 with 16 readings; and 90-day average of 262 with 46 readings.) An ACE inhibitor/angiotensin II receptor blocker is being taken. She does not see a podiatrist.Eye exam is current.  Hyperlipidemia This is a chronic problem. The current episode started more than 1 year ago. The problem is resistant. Recent lipid tests were reviewed and are high. Exacerbating diseases include diabetes and obesity. Factors aggravating her hyperlipidemia include thiazides. Pertinent negatives include no chest pain, leg pain, myalgias or shortness of breath. Current antihyperlipidemic treatment includes fibric acid derivatives and statins. The current treatment provides mild improvement of lipids. Compliance problems include adherence to exercise, adherence to diet and psychosocial issues.  Risk factors for coronary artery disease include diabetes mellitus, dyslipidemia, hypertension, obesity, post-menopausal and a sedentary lifestyle.  Hypertension This is a chronic problem. The current episode started more than 1 year ago. The problem has been waxing and waning since onset. The problem is uncontrolled. Associated symptoms include blurred vision. Pertinent negatives include no chest pain, headaches or shortness of breath. There are no associated agents to hypertension. Risk factors for coronary artery disease include diabetes mellitus, sedentary lifestyle, obesity, dyslipidemia and post-menopausal state. Past treatments include calcium channel blockers, ACE inhibitors and diuretics. The current treatment provides mild improvement. Compliance problems include diet, exercise and psychosocial issues.     Review of  systems  Constitutional: + Minimally fluctuating body weight,  current Body mass index is 32.74 kg/m. , + fatigue, no subjective hyperthermia, no subjective hypothermia Eyes: no blurry vision, no xerophthalmia ENT: no sore throat, no nodules palpated in throat, no dysphagia/odynophagia, no hoarseness Cardiovascular: no chest pain, no shortness of breath, no palpitations, no leg swelling Respiratory: no cough, no shortness of breath Gastrointestinal: mild intermittent nausea/vomiting/diarrhea associated with gastroparesis Musculoskeletal: no muscle/joint aches Skin: no rashes, no hyperemia Neurological: no tremors, + numbness/tingling to BLE, no dizziness Psychiatric: no depression, no  anxiety   Objective:    BP (!) 165/96   Pulse 93   Ht 5' 2"  (1.575 m)   Wt 179 lb (81.2 kg)   BMI 32.74 kg/m   Wt Readings from Last 3 Encounters:  02/25/21 179 lb (81.2 kg)  02/24/21 177 lb 12.8 oz (80.6 kg)  11/16/20 188 lb 6.4 oz (85.5 kg)   BP Readings from Last 3 Encounters:  02/25/21 (!) 165/96  02/24/21 (!) 167/81  11/16/20 (!) 180/100     Physical Exam- Limited  Constitutional:  Body mass index is 32.74 kg/m. , not in acute distress, normal state of mind Eyes:  EOMI, no exophthalmos Neck: Supple Cardiovascular: RRR, no murmurs, rubs, or gallops, no edema Respiratory: Adequate breathing efforts, no crackles, rales, rhonchi, or wheezing Musculoskeletal: no gross deformities, strength intact in all four extremities, no gross restriction of joint movements Skin:  no rashes, no hyperemia Neurological: no tremor with outstretched hands    Lipid Panel     Component Value Date/Time   CHOL 383 (A) 02/17/2021 0000   TRIG 373 (A) 02/17/2021 0000   HDL 61 02/17/2021 0000   LDLCALC 247 02/17/2021 0000     Assessment & Plan:   1) Uncontrolled type 2 diabetes mellitus with complication, with long-term current use of insulin (HCC)  - Patient has currently uncontrolled symptomatic  type 2 DM since 70 years of age.  She presents today with her meter and logs with very little data.  She has not been checking her glucose as often as she was asked to.  Her POCT A1c today is 9.2%, increasing from last visit of 9.1%.  She admits she has not been eating right, usually only eats once a day due to her gastroparesis.  Therefore, she has not been utilizing her U500 properly.  She denies any hypoglycemia.  Analysis of her meter shows 7-day average of 250 with 5 readings; 14-day average of 292 with 11 readings; 30-day average of 288 with 16 readings; and 90-day average of 262 with 46 readings.   - Recent labs are reviewed.   Her diabetes is complicated by noncompliance/nonadherence, obesity and sedentary life and patient remains at a high risk for more acute and chronic complications of diabetes which include CAD, CVA, CKD, retinopathy, and neuropathy. These are all discussed in detail with the patient.  - Nutritional counseling repeated at each appointment due to patients tendency to fall back in to old habits.  - The patient admits there is a room for improvement in their diet and drink choices. -  Suggestion is made for the patient to avoid simple carbohydrates from their diet including Cakes, Sweet Desserts / Pastries, Ice Cream, Soda (diet and regular), Sweet Tea, Candies, Chips, Cookies, Sweet Pastries, Store Bought Juices, Alcohol in Excess of 1-2 drinks a day, Artificial Sweeteners, Coffee Creamer, and "Sugar-free" Products. This will help patient to have stable blood glucose profile and potentially avoid unintended weight gain.   - I encouraged the patient to switch to unprocessed or minimally processed complex starch and increased protein intake (animal or plant source), fruits, and vegetables.   - Patient is advised to stick to a routine mealtimes to eat 3 meals a day and avoid unnecessary snacks (to snack only to correct hypoglycemia).  - I have approached patient with the  following individualized plan to manage diabetes and patient agrees:   -she is struggling to achieve control of diabetes, likely deals with moderate cognitive deficit. Avoiding hypoglycemia is the #1 priority  in her case.   -She is advised to continue with current medication regimen.  We may need to try switching back to basal insulin only since she is missing most opportunities to inject her U500 due to not eating.  She can continue U500 55 units SQ TID with meals if glucose is above 90 and she is eating and Glipizide 5 mg XL daily with breakfast.    -She does not tolerate Metformin due to GI side effects.  -She is encouraged to continue monitoring blood glucose at least 4 times per day, before meals and before bed, and call the clinic if she has readings less than 70 or greater than 300 for 3 tests in a row.    -She is not a good candidate for incretin therapy due to her hypertriglyceridemia increasing her risk of pancreatitis.    2) Lipids/HPL:  Her most recent lipid panel from 02/17/21 shows uncontrolled LDL of 247 and improved triglycerides of 373.  She is advised to continue Pravastatin 40 mg po daily at bedtime and continue Fenofibrate 145 mg po daily (she says she is pretty sure she has not been taking these medications- I refilled them for her today).  3) Hypertension: -Her blood pressure is not controlled to target.  She is advised to continue Norvasc 5 mg po daily, Lisinopril-HCT 40-25 mg po daily and follow up with Dr. Manuella Ghazi regarding her meds.   She is advised to continue follow-up closely with Dr. Manuella Ghazi for primary care needs.      I spent 34 minutes in the care of the patient today including review of labs from [place], Lipids, Thyroid Function, Hematology (current and previous including abstractions from other facilities); face-to-face time discussing  her blood glucose readings/logs, discussing hypoglycemia and hyperglycemia episodes and symptoms, medications doses, her options of  short and long term treatment based on the latest standards of care / guidelines;  discussion about incorporating lifestyle medicine;  and documenting the encounter.    Please refer to Patient Instructions for Blood Glucose Monitoring and Insulin/Medications Dosing Guide"  in media tab for additional information. Please  also refer to " Patient Self Inventory" in the Media  tab for reviewed elements of pertinent patient history.  Orlena Sheldon participated in the discussions, expressed understanding, and voiced agreement with the above plans.  All questions were answered to her satisfaction. she is encouraged to contact clinic should she have any questions or concerns prior to her return visit.   Follow up plan: Return in about 3 months (around 05/28/2021) for Diabetes F/U with A1c in office, No previsit labs, Bring meter and logs.   Rayetta Pigg, [facility] [address] [place], [place] [address] Phone: [phone] Fax: [phone]

## 2021-03-15 DIAGNOSIS — E1143 Type 2 diabetes mellitus with diabetic autonomic (poly)neuropathy: Secondary | ICD-10-CM | POA: Diagnosis not present

## 2021-03-15 DIAGNOSIS — E1165 Type 2 diabetes mellitus with hyperglycemia: Secondary | ICD-10-CM | POA: Diagnosis not present

## 2021-03-15 DIAGNOSIS — E876 Hypokalemia: Secondary | ICD-10-CM | POA: Diagnosis not present

## 2021-03-15 DIAGNOSIS — I10 Essential (primary) hypertension: Secondary | ICD-10-CM | POA: Diagnosis not present

## 2021-03-15 DIAGNOSIS — Z299 Encounter for prophylactic measures, unspecified: Secondary | ICD-10-CM | POA: Diagnosis not present

## 2021-03-15 DIAGNOSIS — R030 Elevated blood-pressure reading, without diagnosis of hypertension: Secondary | ICD-10-CM | POA: Diagnosis not present

## 2021-03-31 DIAGNOSIS — R5383 Other fatigue: Secondary | ICD-10-CM | POA: Diagnosis not present

## 2021-03-31 DIAGNOSIS — Z79899 Other long term (current) drug therapy: Secondary | ICD-10-CM | POA: Diagnosis not present

## 2021-03-31 DIAGNOSIS — Z299 Encounter for prophylactic measures, unspecified: Secondary | ICD-10-CM | POA: Diagnosis not present

## 2021-03-31 DIAGNOSIS — Z7189 Other specified counseling: Secondary | ICD-10-CM | POA: Diagnosis not present

## 2021-03-31 DIAGNOSIS — S81812A Laceration without foreign body, left lower leg, initial encounter: Secondary | ICD-10-CM | POA: Diagnosis not present

## 2021-03-31 DIAGNOSIS — M1710 Unilateral primary osteoarthritis, unspecified knee: Secondary | ICD-10-CM | POA: Diagnosis not present

## 2021-03-31 DIAGNOSIS — Z0000 Encounter for general adult medical examination without abnormal findings: Secondary | ICD-10-CM | POA: Diagnosis not present

## 2021-03-31 DIAGNOSIS — E7800 Pure hypercholesterolemia, unspecified: Secondary | ICD-10-CM | POA: Diagnosis not present

## 2021-03-31 DIAGNOSIS — I10 Essential (primary) hypertension: Secondary | ICD-10-CM | POA: Diagnosis not present

## 2021-03-31 DIAGNOSIS — E559 Vitamin D deficiency, unspecified: Secondary | ICD-10-CM | POA: Diagnosis not present

## 2021-04-02 NOTE — Patient Instructions (Signed)
Carrie Turner  04/02/2021     '@PREFPERIOPPHARMACY'$ @   Your procedure is scheduled on  04/08/2021.   Report to [facility] at  [facility]   Call this number if you have problems the morning of surgery:  [phone]   Remember:  Follow the diet instructions given to you by the office.     DO NOT take any medications for diabetes the morning of your procedure.     Use your inhaler before you come and bring your rescue inhaler with you.     Take these medicines the morning of surgery with A SIP OF WATER        amlodipine, celexa, dexilant, gabapentin, reglan, effexor.    Do not wear jewelry, make-up or nail polish.  Do not wear lotions, powders, or perfumes, or deodorant.  Do not shave 48 hours prior to surgery.  Men may shave face and neck.  Do not bring valuables to the hospital.  [facility] is not responsible for any belongings or valuables.  Contacts, dentures or bridgework may not be worn into surgery.  Leave your suitcase in the car.  After surgery it may be brought to your room.  For patients admitted to the hospital, discharge time will be determined by your treatment team.  Patients discharged the day of surgery will not be allowed to drive home and must have someone with them for 24 hours.    Special instructions:   DO NOT smoke tobacco or vape for 24 hours before your procedure.  Please read over the following fact sheets that you were given. Anesthesia Post-op Instructions and Care and Recovery After Surgery      Upper Endoscopy, Adult, Care After This sheet gives you information about how to care for yourself after your procedure. Your health care provider may also give you more specific instructions. If you have problems or questions, contact your health care provider. What can I expect after the procedure? After the procedure, it is common to have: A sore throat. Mild stomach pain or discomfort. Bloating. Nausea. Follow these  instructions at home:  Follow instructions from your health care provider about what to eat or drink after your procedure. Return to your normal activities as told by your health care provider. Ask your health care provider what activities are safe for you. Take over-the-counter and prescription medicines only as told by your health care provider. If you were given a sedative during the procedure, it can affect you for several hours. Do not drive or operate machinery until your health care provider says that it is safe. Keep all follow-up visits as told by your health care provider. This is important. Contact a health care provider if you have: A sore throat that lasts longer than one day. Trouble swallowing. Get help right away if: You vomit blood or your vomit looks like coffee grounds. You have: A fever. Bloody, black, or tarry stools. A severe sore throat or you cannot swallow. Difficulty breathing. Severe pain in your chest or abdomen. Summary After the procedure, it is common to have a sore throat, mild stomach discomfort, bloating, and nausea. If you were given a sedative during the procedure, it can affect you for several hours. Do not drive or operate machinery until your health care provider says that it is safe. Follow instructions from your health care provider about what to eat or drink after your procedure. Return to your normal activities as  told by your health care provider. This information is not intended to replace advice given to you by your health care provider. Make sure you discuss any questions you have with your health care provider. Document Revised: 07/09/2019 Document Reviewed: 12/11/2017 Elsevier Patient Education  2022 [employer]. Esophageal Dilatation Esophageal dilatation, also called esophageal dilation, is a procedure to widen or open a blocked or narrowed part of the esophagus. The esophagus is the part of the body that moves food and liquid from the  mouth to the stomach. You may need this procedure if: You have a buildup of scar tissue in your esophagus that makes it difficult, painful, or impossible to swallow. This can be caused by gastroesophageal reflux disease (GERD). You have cancer of the esophagus. There is a problem with how food moves through your esophagus. In some cases, you may need this procedure repeated at a later time to dilate the esophagus gradually. Tell a health care provider about: Any allergies you have. All medicines you are taking, including vitamins, herbs, eye drops, creams, and over-the-counter medicines. Any problems you or family members have had with anesthetic medicines. Any blood disorders you have. Any surgeries you have had. Any medical conditions you have. Any antibiotic medicines you are required to take before dental procedures. Whether you are pregnant or may be pregnant. What are the risks? Generally, this is a safe procedure. However, problems may occur, including: Bleeding due to a tear in the lining of the esophagus. A hole, or perforation, in the esophagus. What happens before the procedure? Ask your health care provider about: Changing or stopping your regular medicines. This is especially important if you are taking diabetes medicines or blood thinners. Taking medicines such as aspirin and ibuprofen. These medicines can thin your blood. Do not take these medicines unless your health care provider tells you to take them. Taking over-the-counter medicines, vitamins, herbs, and supplements. Follow instructions from your health care provider about eating or drinking restrictions. Plan to have a responsible adult take you home from the hospital or clinic. Plan to have a responsible adult care for you for the time you are told after you leave the hospital or clinic. This is important. What happens during the procedure? You may be given a medicine to help you relax (sedative). A numbing medicine  may be sprayed into the back of your throat, or you may gargle the medicine. Your health care provider may perform the dilatation using various surgical instruments, such as: Simple dilators. This instrument is carefully placed in the esophagus to stretch it. Guided wire bougies. This involves using an endoscope to insert a wire into the esophagus. A dilator is passed over this wire to enlarge the esophagus. Then the wire is removed. Balloon dilators. An endoscope with a small balloon is inserted into the esophagus. The balloon is inflated to stretch the esophagus and open it up. The procedure may vary among health care providers and hospitals. What can I expect after the procedure? Your blood pressure, heart rate, breathing rate, and blood oxygen level will be monitored until you leave the hospital or clinic. Your throat may feel slightly sore and numb. This will get better over time. You will not be allowed to eat or drink until your throat is no longer numb. When you are able to drink, urinate, and sit on the edge of the bed without nausea or dizziness, you may be able to return home. Follow these instructions at home: Take over-the-counter and prescription medicines  only as told by your health care provider. If you were given a sedative during the procedure, it can affect you for several hours. Do not drive or operate machinery until your health care provider says that it is safe. Plan to have a responsible adult care for you for the time you are told. This is important. Follow instructions from your health care provider about any eating or drinking restrictions. Do not use any products that contain nicotine or tobacco, such as cigarettes, e-cigarettes, and chewing tobacco. If you need help quitting, ask your health care provider. Keep all follow-up visits. This is important. Contact a health care provider if: You have a fever. You have pain that is not relieved by medicine. Get help right  away if: You have chest pain. You have trouble breathing. You have trouble swallowing. You vomit blood. You have black, tarry, or bloody stools. These symptoms may represent a serious problem that is an emergency. Do not wait to see if the symptoms will go away. Get medical help right away. Call your local emergency services (911 in the [place] Do not drive yourself to the hospital. Summary Esophageal dilatation, also called esophageal dilation, is a procedure to widen or open a blocked or narrowed part of the esophagus. Plan to have a responsible adult take you home from the hospital or clinic. For this procedure, a numbing medicine may be sprayed into the back of your throat, or you may gargle the medicine. Do not drive or operate machinery until your health care provider says that it is safe. This information is not intended to replace advice given to you by your health care provider. Make sure you discuss any questions you have with your health care provider. Document Revised: 11/27/2019 Document Reviewed: 11/27/2019 Elsevier Patient Education  [facility] After This sheet gives you information about how to care for yourself after your procedure. Your health care provider may also give you more specific instructions. If you have problems or questions, contact your health care provider. What can I expect after the procedure? After the procedure, it is common to have: Tiredness. Forgetfulness about what happened after the procedure. Impaired judgment for important decisions. Nausea or vomiting. Some difficulty with balance. Follow these instructions at home: For the time period you were told by your health care provider:   Rest as needed. Do not participate in activities where you could fall or become injured. Do not drive or use machinery. Do not drink alcohol. Do not take sleeping pills or medicines that cause drowsiness. Do not make  important decisions or sign legal documents. Do not take care of children on your own. Eating and drinking Follow the diet that is recommended by your health care provider. Drink enough fluid to keep your urine pale yellow. If you vomit: Drink water, juice, or soup when you can drink without vomiting. Make sure you have little or no nausea before eating solid foods. General instructions Have a responsible adult stay with you for the time you are told. It is important to have someone help care for you until you are awake and alert. Take over-the-counter and prescription medicines only as told by your health care provider. If you have sleep apnea, surgery and certain medicines can increase your risk for breathing problems. Follow instructions from your health care provider about wearing your sleep device: Anytime you are sleeping, including during daytime naps. While taking prescription pain medicines, sleeping medicines, or medicines that make you  drowsy. Avoid smoking. Keep all follow-up visits as told by your health care provider. This is important. Contact a health care provider if: You keep feeling nauseous or you keep vomiting. You feel light-headed. You are still sleepy or having trouble with balance after 24 hours. You develop a rash. You have a fever. You have redness or swelling around the IV site. Get help right away if: You have trouble breathing. You have new-onset confusion at home. Summary For several hours after your procedure, you may feel tired. You may also be forgetful and have poor judgment. Have a responsible adult stay with you for the time you are told. It is important to have someone help care for you until you are awake and alert. Rest as told. Do not drive or operate machinery. Do not drink alcohol or take sleeping pills. Get help right away if you have trouble breathing, or if you suddenly become confused. This information is not intended to replace advice  given to you by your health care provider. Make sure you discuss any questions you have with your health care provider. Document Revised: 03/26/2020 Document Reviewed: 06/13/2019 Elsevier Patient Education  2022 [employer].

## 2021-04-06 ENCOUNTER — Other Ambulatory Visit: Payer: Self-pay

## 2021-04-06 ENCOUNTER — Encounter (HOSPITAL_COMMUNITY): Payer: Self-pay

## 2021-04-06 ENCOUNTER — Encounter (HOSPITAL_COMMUNITY)
Admission: RE | Admit: 2021-04-06 | Discharge: 2021-04-06 | Disposition: A | Discharge status: Home / Self Care | Payer: Medicare Other | Source: Ambulatory Visit | Attending: Internal Medicine | Admitting: Internal Medicine

## 2021-04-06 DIAGNOSIS — Z01818 Encounter for other preprocedural examination: Secondary | ICD-10-CM | POA: Insufficient documentation

## 2021-04-06 DIAGNOSIS — I4510 Unspecified right bundle-branch block: Secondary | ICD-10-CM | POA: Insufficient documentation

## 2021-04-06 HISTORY — DX: Sleep apnea, unspecified: G47.30

## 2021-04-06 LAB — BASIC METABOLIC PANEL
Anion gap: 11 (ref 5–15)
BUN: 12 mg/dL (ref 8–23)
CO2: 25 mmol/L (ref 22–32)
Calcium: 9.3 mg/dL (ref 8.9–10.3)
Chloride: 100 mmol/L (ref 98–111)
Creatinine, Ser: 0.57 mg/dL (ref 0.44–1.00)
GFR, Estimated: >60 mL/min (ref >60)
Glucose, Bld: 222 mg/dL — ABNORMAL HIGH (ref 70–99)
Potassium: 3.9 mmol/L (ref 3.5–5.1)
Sodium: 136 mmol/L (ref 135–145)

## 2021-04-08 ENCOUNTER — Telehealth: Payer: Self-pay

## 2021-04-08 NOTE — Telephone Encounter (Signed)
Pre-op phone call 04/13/21.   Tried to call pt to inform her of pre-op phone call, LMOVM to inform her.

## 2021-04-08 NOTE — Telephone Encounter (Signed)
Pt called office this morning. She was scheduled for EGD/-/+DIL but her alarm didn't go off and she overslept. Requested to reschedule procedure. Procedure rescheduled to 04/15/21 at 1:45pm. Instructions given on phone and mailed.  Endo scheduler informed pt rescheduled.

## 2021-04-13 ENCOUNTER — Encounter (HOSPITAL_COMMUNITY)
Admission: RE | Admit: 2021-04-13 | Discharge: 2021-04-13 | Disposition: A | Discharge status: Home / Self Care | Payer: Medicare Other | Source: Ambulatory Visit | Attending: Internal Medicine | Admitting: Internal Medicine

## 2021-04-13 ENCOUNTER — Other Ambulatory Visit: Payer: Self-pay

## 2021-04-15 ENCOUNTER — Ambulatory Visit (HOSPITAL_COMMUNITY): Payer: Medicare Other | Admitting: Anesthesiology

## 2021-04-15 ENCOUNTER — Other Ambulatory Visit: Payer: Self-pay

## 2021-04-15 ENCOUNTER — Ambulatory Visit (HOSPITAL_COMMUNITY)
Admission: RE | Admit: 2021-04-15 | Discharge: 2021-04-15 | Disposition: A | Discharge status: Home / Self Care | Payer: Medicare Other | Attending: Internal Medicine | Admitting: Internal Medicine

## 2021-04-15 ENCOUNTER — Encounter (HOSPITAL_COMMUNITY): Payer: Self-pay | Admitting: Internal Medicine

## 2021-04-15 ENCOUNTER — Encounter (HOSPITAL_COMMUNITY)
Admission: RE | Disposition: A | Discharge status: Home / Self Care | Payer: Self-pay | Source: Home / Self Care | Attending: Internal Medicine

## 2021-04-15 DIAGNOSIS — R1310 Dysphagia, unspecified: Secondary | ICD-10-CM | POA: Insufficient documentation

## 2021-04-15 DIAGNOSIS — Z79899 Other long term (current) drug therapy: Secondary | ICD-10-CM | POA: Diagnosis not present

## 2021-04-15 DIAGNOSIS — K219 Gastro-esophageal reflux disease without esophagitis: Secondary | ICD-10-CM | POA: Diagnosis not present

## 2021-04-15 DIAGNOSIS — Z882 Allergy status to sulfonamides status: Secondary | ICD-10-CM | POA: Diagnosis not present

## 2021-04-15 DIAGNOSIS — J449 Chronic obstructive pulmonary disease, unspecified: Secondary | ICD-10-CM | POA: Diagnosis not present

## 2021-04-15 DIAGNOSIS — Z7982 Long term (current) use of aspirin: Secondary | ICD-10-CM | POA: Diagnosis not present

## 2021-04-15 DIAGNOSIS — K449 Diaphragmatic hernia without obstruction or gangrene: Secondary | ICD-10-CM | POA: Diagnosis not present

## 2021-04-15 DIAGNOSIS — Z7984 Long term (current) use of oral hypoglycemic drugs: Secondary | ICD-10-CM | POA: Insufficient documentation

## 2021-04-15 DIAGNOSIS — Z9049 Acquired absence of other specified parts of digestive tract: Secondary | ICD-10-CM | POA: Insufficient documentation

## 2021-04-15 DIAGNOSIS — Z794 Long term (current) use of insulin: Secondary | ICD-10-CM | POA: Diagnosis not present

## 2021-04-15 HISTORY — PX: ESOPHAGOGASTRODUODENOSCOPY (EGD) WITH PROPOFOL: SHX5813

## 2021-04-15 HISTORY — PX: MALONEY DILATION: SHX5535

## 2021-04-15 LAB — GLUCOSE, CAPILLARY: Glucose-Capillary: 199 mg/dL — ABNORMAL HIGH (ref 70–99)

## 2021-04-15 SURGERY — ESOPHAGOGASTRODUODENOSCOPY (EGD) WITH PROPOFOL
Anesthesia: General

## 2021-04-15 MED ORDER — LACTATED RINGERS IV SOLN: INTRAVENOUS | Status: DC

## 2021-04-15 MED ORDER — PROPOFOL 10 MG/ML IV BOLUS
INTRAVENOUS | Status: DC | PRN
  Administered 2021-04-15: 50 mg via INTRAVENOUS
  Administered 2021-04-15: 100 mg via INTRAVENOUS
  Administered 2021-04-15: 30 mg via INTRAVENOUS

## 2021-04-15 MED ORDER — LIDOCAINE HCL (CARDIAC) PF 100 MG/5ML IV SOSY
PREFILLED_SYRINGE | INTRAVENOUS | Status: DC | PRN
  Administered 2021-04-15: 50 mg via INTRAVENOUS

## 2021-04-15 MED ORDER — STERILE WATER FOR IRRIGATION IR SOLN
Status: DC | PRN
  Administered 2021-04-15: 100 mL

## 2021-04-15 NOTE — Transfer of Care (Signed)
Immediate Anesthesia Transfer of Care Note  Patient: Carrie Turner  Procedure(s) Performed: ESOPHAGOGASTRODUODENOSCOPY (EGD) WITH PROPOFOL [place]  Patient Location: Short Stay  Anesthesia Type:General  Level of Consciousness: awake, alert  and oriented  Airway & Oxygen Therapy: Patient Spontanous Breathing  Post-op Assessment: Report given to RN and Post -op Vital signs reviewed and stable  Post vital signs: Reviewed and stable  Last Vitals:  Vitals Value Taken Time  BP    Temp    Pulse    Resp    SpO2      Last Pain:  Vitals:   04/15/21 1431  TempSrc:   PainSc: 0-No pain      Patients Stated Pain Goal: 8 (65/99/35 [id number])  Complications: No notable events documented.

## 2021-04-15 NOTE — Op Note (Signed)
[facility] Patient Name: Carrie Turner Procedure Date: 04/15/2021 1:03 PM MRN: [id number] Date of Birth: Apr 22, 1951 Attending MD: Norvel Richards , MD CSN: [id number] Age: 70 Admit Type: Outpatient Procedure:                Upper GI endoscopy Indications:              Dysphagia; GERD Providers:                Norvel Richards, MD, Lambert Mody,                            Crystal Page, Randa Spike, Technician Referring MD:              Medicines:                Propofol per Anesthesia Complications:            No immediate complications. Estimated Blood Loss:     Estimated blood loss: none. Procedure:                Pre-Anesthesia Assessment:                           - Prior to the procedure, a History and Physical                            was performed, and patient medications and                            allergies were reviewed. The patient's tolerance of                            previous anesthesia was also reviewed. The risks                            and benefits of the procedure and the sedation                            options and risks were discussed with the patient.                            All questions were answered, and informed consent                            was obtained. Prior Anticoagulants: The patient has                            taken no previous anticoagulant or antiplatelet                            agents. ASA Grade Assessment: III - A patient with                            severe systemic disease. After reviewing the risks  and benefits, the patient was deemed in                            satisfactory condition to undergo the procedure.                           After obtaining informed consent, the endoscope was                            passed under direct vision. Throughout the                            procedure, the patient's blood pressure, pulse, and                            oxygen  saturations were monitored continuously. The                            GIF-H190 ([id number]) scope was introduced through the                            mouth, and advanced to the second part of duodenum.                            The upper GI endoscopy was accomplished without                            difficulty. The patient tolerated the procedure                            well. Scope In: 2:37:06 PM Scope Out: 2:41:46 PM Total Procedure Duration: 0 hours 4 minutes 40 seconds  Findings:      The examined esophagus was normal. The scope was withdrawn. Dilation was       performed with a Maloney dilator with mild resistance at 56 Fr. The       dilation site was examined following endoscope reinsertion and showed no       change. Estimated blood loss: none.      A medium-sized hiatal hernia was present. Otherwise, gastric mucosa       appeared normal. Patent pylorus. Normal-appearing D1 and D2 Impression:               - Normal esophagus. Dilated.                           - Medium-sized hiatal hernia.                           - No specimens collected. Moderate Sedation:      Moderate (conscious) sedation was personally administered by an       anesthesia professional. The following parameters were monitored: oxygen       saturation, heart rate, blood pressure, respiratory rate, EKG, adequacy       of pulmonary ventilation, and response to care. Recommendation:           - Patient has a contact number  available for                            emergencies. The signs and symptoms of potential                            delayed complications were discussed with the                            patient. Return to normal activities tomorrow.                            Written discharge instructions were provided to the                            patient.                           - Advance diet as tolerated.                           - Continue present medications. However, change                             timing of Dexilant administration from 30 minutes                            before breakfast to 30 minutes before lunch to come                            back late night early morning exacerbations of                            reflux.                           - Return to my office in 3 months. Procedure Code(s):        --- Professional ---                           [phone], Esophagogastroduodenoscopy, flexible,                            transoral; diagnostic, including collection of                            specimen(s) by brushing or washing, when performed                            (separate procedure)                           43450, Dilation of esophagus, by unguided sound or                            bougie, single or multiple passes Diagnosis Code(s):        ---  Professional ---                           K44.9, Diaphragmatic hernia without obstruction or                            gangrene                           R13.10, Dysphagia, unspecified CPT copyright 2019 [employer]. All rights reserved. The codes documented in this report are preliminary and upon coder review may  be revised to meet current compliance requirements. [name]. Chassity Ludke, MD Norvel Richards, MD 04/15/2021 3:08:37 PM This report has been signed electronically. Number of Addenda: 0

## 2021-04-15 NOTE — H&P (Signed)
@LOGO @   Primary Care Physician:  Monico Blitz, MD Primary Gastroenterologist:  Dr. Gala Romney  Pre-Procedure History & Physical: HPI:  Carrie Turner is a 70 y.o. female here for dysphagia. Hx of same in 2019  Past Medical History:  Diagnosis Date   Anxiety    Arthritis    COPD (chronic obstructive pulmonary disease) ([place])    Delayed gastric emptying    GES completed on 08/27/2019 revealed borderline delayed gastric emptying; 87% emptying at 4 hours (normal greater than 90%)   Dementia ([place]) 09/30/2019   per patient, early dementia diagnosed after seeing PCP recently   Depression    Diabetes mellitus, type II ([place])    GERD (gastroesophageal reflux disease)    Glaucoma    Hyperlipidemia    Hypertension    Migraines    Neuropathy    Seizures ([place])    seizure was from ETOH, "a long time ago", no med and no recurrance   Sleep apnea    Vitamin D deficiency     Past Surgical History:  Procedure Laterality Date   ABDOMINAL HYSTERECTOMY     CATARACT EXTRACTION W/PHACO Left 07/24/2019   Procedure: CATARACT EXTRACTION PHACO AND INTRAOCULAR LENS PLACEMENT LEFT EYE  (CDE: 5.60);  Surgeon: Baruch Goldmann, MD;  Location: [facility];  Service: Ophthalmology;  Laterality: Left;   CATARACT EXTRACTION W/PHACO Right 08/19/2019   Procedure: CATARACT EXTRACTION PHACO AND INTRAOCULAR LENS PLACEMENT RIGHT EYE;  Surgeon: Baruch Goldmann, MD;  Location: [facility];  Service: Ophthalmology;  Laterality: Right;  CDE: 8.83   CHOLECYSTECTOMY N/A 12/25/2019   Procedure: LAPAROSCOPIC CHOLECYSTECTOMY;  Surgeon: Aviva Signs, MD;  Location: [facility];  Service: General;  Laterality: N/A;   COLONOSCOPY  11/2015   Dr. Britta Mccreedy: sessile polyp removed (benign). advised repeat colonoscopy in 5 years.    COLONOSCOPY WITH PROPOFOL N/A 10/26/2017   RMR: Tubular adenoma removed.  Random colon biopsies negative.  Nonbleeding internal hemorrhoids.  Next colonoscopy in 5 years.   ESOPHAGEAL DILATION  10/26/2017   Procedure: ESOPHAGEAL  DILATION;  Surgeon: Daneil Dolin, MD;  [facility]: [facility];  Service: Endoscopy;;   ESOPHAGOGASTRODUODENOSCOPY  11/2015   Dr. Britta Mccreedy: hiatal hernia   ESOPHAGOGASTRODUODENOSCOPY (EGD) WITH PROPOFOL N/A 10/26/2017   RMR: Erosive reflux esophagitis, hiatal hernia.  Small bowel biopsies negative.   INSERTION OF ANTERIOR SEGMENT AQUEOUS DRAINAGE DEVICE (ISTENT) Left 07/24/2019   Procedure: INSERTION OF ANTERIOR SEGMENT AQUEOUS DRAINAGE DEVICE (ISTENT) LEFT EYE;  Surgeon: Baruch Goldmann, MD;  Location: [facility];  Service: Ophthalmology;  Laterality: Left;   INSERTION OF ANTERIOR SEGMENT AQUEOUS DRAINAGE DEVICE (ISTENT) Right 08/19/2019   Procedure: INSERTION OF ANTERIOR SEGMENT AQUEOUS DRAINAGE DEVICE (ISTENT) RIGHT EYE;  Surgeon: Baruch Goldmann, MD;  Location: [facility];  Service: Ophthalmology;  Laterality: Right;   KNEE ARTHROSCOPY Right    ORIF ANKLE FRACTURE Right     Prior to Admission medications   Medication Sig Start Date End Date Taking? Authorizing Provider  albuterol (VENTOLIN HFA) 108 (90 Base) MCG/ACT inhaler Inhale 1-2 puffs into the lungs every 6 (six) hours as needed for wheezing or shortness of breath.   Yes [provider]  amLODipine (NORVASC) 5 MG tablet Take 5 mg by mouth daily.    Yes [provider]  aspirin EC 81 MG tablet Take 81 mg by mouth daily.   Yes [provider]  Blood Glucose Monitoring Suppl (ONETOUCH VERIO) w/Device KIT 1 each by Does not apply route in the morning, at noon, in the evening,  and at bedtime. 02/17/20  Yes Cassandria Anger, MD  Cholecalciferol (VITAMIN D3) 2000 units TABS Take 2,000 Units by mouth daily.   Yes [provider]  citalopram (CELEXA) 40 MG tablet Take 40 mg by mouth daily.   Yes [provider]  Continuous Blood Gluc Sensor (FREESTYLE LIBRE 14 DAY SENSOR) MISC Inject 1 each into the skin every 14 (fourteen) days. Use as directed. 05/07/20  Yes Reardon, Juanetta Beets, NP  dexlansoprazole  (DEXILANT) 60 MG capsule TAKE 1 CAPSULE BY MOUTH ONCE DAILY. 02/01/21  Yes Mahala Menghini, PA-C  donepezil (ARICEPT) 5 MG tablet Take 5 mg by mouth at bedtime.   Yes [provider]  eszopiclone (LUNESTA) 2 MG TABS tablet Take 2 mg by mouth at bedtime as needed for sleep. Take immediately before bedtime   Yes [provider]  fenofibrate (TRICOR) 145 MG tablet Take 1 tablet (145 mg total) by mouth daily. 02/25/21  Yes Brita Romp, NP  gabapentin (NEURONTIN) 800 MG tablet Take 800 mg by mouth 3 (three) times daily. 03/25/20  Yes [provider]  glipiZIDE (GLUCOTROL XL) 5 MG 24 hr tablet Take 5 mg by mouth daily. 02/03/20  Yes [provider]  HUMULIN R U-500 KWIKPEN 500 UNIT/ML kwikpen INJECT 55 UNITS INTO THE SKIN 3 TIMES A DAY WITH MEALS. 02/09/21  Yes Reardon, Loree Fee J, NP  Insulin Pen Needle (GLOBAL EASE INJECT PEN NEEDLES) 31G X 5 MM MISC USE 3 TIMES A DAY OR AS DIRECTED. 02/24/21  Yes Reardon, Whitney J, NP  Insulin Pen Needle (PEN NEEDLES) 31G X 6 MM MISC 1 each by Does not apply route 4 (four) times daily. 03/07/19  Yes Nida, Marella Chimes, MD  lisinopril-hydrochlorothiazide (PRINZIDE,ZESTORETIC) 20-12.5 MG tablet Take 2 tablets by mouth daily.    Yes [provider]  metoCLOPramide (REGLAN) 5 MG tablet Take 5 mg by mouth in the morning and at bedtime. 01/31/20  Yes [provider]  ondansetron (ZOFRAN) 8 MG tablet Take 1 tablet (8 mg total) by mouth every 8 (eight) hours as needed for nausea or vomiting. 01/02/20  Yes Harper, Kristen S, PA-C  ONETOUCH VERIO test strip USE 1 STRIP TO CHECK GLUCOSE 4 TIMES DAILY. 02/17/20  Yes Cassandria Anger, MD  pravastatin (PRAVACHOL) 40 MG tablet Take 1 tablet (40 mg total) by mouth daily. 02/25/21  Yes Brita Romp, NP  PREMARIN vaginal cream SMARTSIG:1 Vaginal Every Night 01/24/20  Yes [provider]  pyridOXINE (VITAMIN B-6) 100 MG tablet Take 100 mg by mouth daily.   Yes [provider]  temazepam (RESTORIL) 30 MG capsule Take 30 mg by mouth at bedtime as needed. 10/27/20  Yes [provider]  venlafaxine XR (EFFEXOR-XR) 150 MG 24 hr capsule Take 150 mg by mouth daily. 10/27/20  Yes [provider]  vitamin B-12 (CYANOCOBALAMIN) 500 MCG tablet Take 500 mcg by mouth daily.   Yes [provider]  LINZESS 72 MCG capsule Take 72 mcg by mouth every morning. As needed 10/27/20   [provider]    Allergies as of 02/24/2021 - Review Complete 02/24/2021  Allergen Reaction Noted   Sulfa antibiotics Other (See Comments) 03/04/2016    Family History  Problem Relation Age of Onset   Hypertension Mother    Colon cancer Mother        diagnosed early 55s   Cerebral aneurysm Mother    Alcohol abuse Father    Pancreatic cancer Sister  deceased in 68s   Colon cancer Sister        diagnosed 33 y/o   Stroke Brother    Cancer Maternal Grandmother    Cancer Maternal Grandfather    Cancer Paternal Grandmother    Other Paternal Grandfather    Other Brother     Social History   Socioeconomic History   Marital status: Divorced    Spouse name: Not on file   Number of children: Not on file   Years of education: Not on file   Highest education level: Not on file  Occupational History   Not on file  Tobacco Use   Smoking status: Never   Smokeless tobacco: Never  Vaping Use   Vaping Use: Never used  Substance and Sexual Activity   Alcohol use: No   Drug use: No   Sexual activity: Never  Other Topics Concern   Not on file  Social History Narrative   Not on file   Social Determinants of Health   Financial Resource Strain: Not on file  Food Insecurity: Not on file  Transportation Needs: Not on file  Physical Activity: Not on file  Stress: Not on file  Social Connections: Not on file  Intimate Partner Violence: Not on file    Review of Systems: See HPI, otherwise negative ROS  Physical Exam: BP 124/75   Pulse 90    Temp 98.4 F (36.9 C) (Oral)   Resp 19   SpO2 96%  General:   Alert,  Well-developed, well-nourished, pleasant and cooperative in NAD Neck:  Supple; no masses or thyromegaly. No significant cervical adenopathy. Lungs:  Clear throughout to auscultation.   No wheezes, crackles, or rhonchi. No acute distress. Heart:  Regular rate and rhythm; no murmurs, clicks, rubs,  or gallops. Abdomen: Non-distended, normal bowel sounds.  Soft and nontender without appreciable mass or hepatosplenomegaly.  Pulses:  Normal pulses noted. Extremities:  Without clubbing or edema.  Impression/Plan: 70 y/o female with esophageal dysphagia.  Here for EGD/ED. The risks, benefits, limitations, alternatives and imponderables have been reviewed with the patient. Potential for esophageal dilation, biopsy, etc. have also been reviewed.  Questions have been answered. All parties agreeable.     Notice: This dictation was prepared with Dragon dictation along with smaller phrase technology. Any transcriptional errors that result from this process are unintentional and may not be corrected upon review.

## 2021-04-15 NOTE — Anesthesia Preprocedure Evaluation (Signed)
Anesthesia Evaluation  Patient identified by MRN, date of birth, ID band Patient awake    Reviewed: Allergy & Precautions, H&P , NPO status , Patient's Chart, lab work & pertinent test results, reviewed documented beta blocker date and time   Airway Mallampati: II  TM Distance: >3 FB Neck ROM: full    Dental no notable dental hx.    Pulmonary sleep apnea , COPD,    Pulmonary exam normal breath sounds clear to auscultation       Cardiovascular Exercise Tolerance: Good hypertension, negative cardio ROS   Rhythm:regular Rate:Normal     Neuro/Psych  Headaches, PSYCHIATRIC DISORDERS Anxiety Depression Dementia    GI/Hepatic Neg liver ROS, GERD  Medicated,  Endo/Other  negative endocrine ROSdiabetes  Renal/GU negative Renal ROS  negative genitourinary   Musculoskeletal   Abdominal   Peds  Hematology  (+) Blood dyscrasia, anemia ,   Anesthesia Other Findings   Reproductive/Obstetrics negative OB ROS                             Anesthesia Physical Anesthesia Plan  ASA: 3  Anesthesia Plan: General   Post-op Pain Management:    Induction:   PONV Risk Score and Plan: Propofol infusion  Airway Management Planned:   Additional Equipment:   Intra-op Plan:   Post-operative Plan:   Informed Consent: I have reviewed the patients History and Physical, chart, labs and discussed the procedure including the risks, benefits and alternatives for the proposed anesthesia with the patient or authorized representative who has indicated his/her understanding and acceptance.     Dental Advisory Given  Plan Discussed with: CRNA  Anesthesia Plan Comments:         Anesthesia Quick Evaluation

## 2021-04-15 NOTE — Discharge Instructions (Signed)
EGD Discharge instructions Please read the instructions outlined below and refer to this sheet in the next few weeks. These discharge instructions provide you with general information on caring for yourself after you leave the hospital. Your doctor may also give you specific instructions. While your treatment has been planned according to the most current medical practices available, unavoidable complications occasionally occur. If you have any problems or questions after discharge, please call your doctor. ACTIVITY You may resume your regular activity but move at a slower pace for the next 24 hours.  Take frequent rest periods for the next 24 hours.  Walking will help expel (get rid of) the air and reduce the bloated feeling in your abdomen.  No driving for 24 hours (because of the anesthesia (medicine) used during the test).  You may shower.  Do not sign any important legal documents or operate any machinery for 24 hours (because of the anesthesia used during the test).  NUTRITION Drink plenty of fluids.  You may resume your normal diet.  Begin with a light meal and progress to your normal diet.  Avoid alcoholic beverages for 24 hours or as instructed by your caregiver.  MEDICATIONS You may resume your normal medications unless your caregiver tells you otherwise.  WHAT YOU CAN EXPECT TODAY You may experience abdominal discomfort such as a feeling of fullness or "gas" pains.  FOLLOW-UP Your doctor will discuss the results of your test with you.  SEEK IMMEDIATE MEDICAL ATTENTION IF ANY OF THE FOLLOWING OCCUR: Excessive nausea (feeling sick to your stomach) and/or vomiting.  Severe abdominal pain and distention (swelling).  Trouble swallowing.  Temperature over 101 F (37.8 C).  Rectal bleeding or vomiting of blood.   Your esophagus was stretched today.  Change the way you are taking Dexilant as follows:  Take Dexilant 30 minutes before lunch every day not breakfast  Office visit  with [place] in 3 months

## 2021-04-15 NOTE — H&P (Signed)
@LOGO @   Primary Care Physician:  Monico Blitz, MD Primary Gastroenterologist:  Dr.   Pre-Procedure History & Physical: HPI:  Carrie Turner is a 70 y.o. female here for further evaluation of dysphagia. Hx of same 2019.  Past Medical History:  Diagnosis Date   Anxiety    Arthritis    COPD (chronic obstructive pulmonary disease) ([place])    Delayed gastric emptying    GES completed on 08/27/2019 revealed borderline delayed gastric emptying; 87% emptying at 4 hours (normal greater than 90%)   Dementia ([place]) 09/30/2019   per patient, early dementia diagnosed after seeing PCP recently   Depression    Diabetes mellitus, type II ([place])    GERD (gastroesophageal reflux disease)    Glaucoma    Hyperlipidemia    Hypertension    Migraines    Neuropathy    Seizures ([place])    seizure was from ETOH, "a long time ago", no med and no recurrance   Sleep apnea    Vitamin D deficiency     Past Surgical History:  Procedure Laterality Date   ABDOMINAL HYSTERECTOMY     CATARACT EXTRACTION W/PHACO Left 07/24/2019   Procedure: CATARACT EXTRACTION PHACO AND INTRAOCULAR LENS PLACEMENT LEFT EYE  (CDE: 5.60);  Surgeon: Baruch Goldmann, MD;  Location: [facility];  Service: Ophthalmology;  Laterality: Left;   CATARACT EXTRACTION W/PHACO Right 08/19/2019   Procedure: CATARACT EXTRACTION PHACO AND INTRAOCULAR LENS PLACEMENT RIGHT EYE;  Surgeon: Baruch Goldmann, MD;  Location: [facility];  Service: Ophthalmology;  Laterality: Right;  CDE: 8.83   CHOLECYSTECTOMY N/A 12/25/2019   Procedure: LAPAROSCOPIC CHOLECYSTECTOMY;  Surgeon: Aviva Signs, MD;  Location: [facility];  Service: General;  Laterality: N/A;   COLONOSCOPY  11/2015   Dr. Britta Mccreedy: sessile polyp removed (benign). advised repeat colonoscopy in 5 years.    COLONOSCOPY WITH PROPOFOL N/A 10/26/2017   RMR: Tubular adenoma removed.  Random colon biopsies negative.  Nonbleeding internal hemorrhoids.  Next colonoscopy in 5 years.   ESOPHAGEAL DILATION  10/26/2017   Procedure:  ESOPHAGEAL DILATION;  Surgeon: Daneil Dolin, MD;  [facility];  Service: Endoscopy;;   ESOPHAGOGASTRODUODENOSCOPY  11/2015   Dr. Britta Mccreedy: hiatal hernia   ESOPHAGOGASTRODUODENOSCOPY (EGD) WITH PROPOFOL N/A 10/26/2017   RMR: Erosive reflux esophagitis, hiatal hernia.  Small bowel biopsies negative.   INSERTION OF ANTERIOR SEGMENT AQUEOUS DRAINAGE DEVICE (ISTENT) Left 07/24/2019   Procedure: INSERTION OF ANTERIOR SEGMENT AQUEOUS DRAINAGE DEVICE (ISTENT) LEFT EYE;  Surgeon: Baruch Goldmann, MD;  Location: [facility];  Service: Ophthalmology;  Laterality: Left;   INSERTION OF ANTERIOR SEGMENT AQUEOUS DRAINAGE DEVICE (ISTENT) Right 08/19/2019   Procedure: INSERTION OF ANTERIOR SEGMENT AQUEOUS DRAINAGE DEVICE (ISTENT) RIGHT EYE;  Surgeon: Baruch Goldmann, MD;  Location: [facility];  Service: Ophthalmology;  Laterality: Right;   KNEE ARTHROSCOPY Right    ORIF ANKLE FRACTURE Right     Prior to Admission medications   Medication Sig Start Date End Date Taking? Authorizing Provider  albuterol (VENTOLIN HFA) 108 (90 Base) MCG/ACT inhaler Inhale 1-2 puffs into the lungs every 6 (six) hours as needed for wheezing or shortness of breath.   Yes [provider]  amLODipine (NORVASC) 5 MG tablet Take 5 mg by mouth daily.    Yes [provider]  aspirin EC 81 MG tablet Take 81 mg by mouth daily.   Yes [provider]  Blood Glucose Monitoring Suppl (ONETOUCH VERIO) w/Device KIT 1 each by Does not apply route in the morning, at noon, in  the evening, and at bedtime. 02/17/20  Yes Cassandria Anger, MD  Cholecalciferol (VITAMIN D3) 2000 units TABS Take 2,000 Units by mouth daily.   Yes [provider]  citalopram (CELEXA) 40 MG tablet Take 40 mg by mouth daily.   Yes [provider]  Continuous Blood Gluc Sensor (FREESTYLE LIBRE 14 DAY SENSOR) MISC Inject 1 each into the skin every 14 (fourteen) days. Use as directed. 05/07/20  Yes Reardon, Juanetta Beets, NP   dexlansoprazole (DEXILANT) 60 MG capsule TAKE 1 CAPSULE BY MOUTH ONCE DAILY. 02/01/21  Yes Mahala Menghini, PA-C  donepezil (ARICEPT) 5 MG tablet Take 5 mg by mouth at bedtime.   Yes [provider]  eszopiclone (LUNESTA) 2 MG TABS tablet Take 2 mg by mouth at bedtime as needed for sleep. Take immediately before bedtime   Yes [provider]  fenofibrate (TRICOR) 145 MG tablet Take 1 tablet (145 mg total) by mouth daily. 02/25/21  Yes Brita Romp, NP  gabapentin (NEURONTIN) 800 MG tablet Take 800 mg by mouth 3 (three) times daily. 03/25/20  Yes [provider]  glipiZIDE (GLUCOTROL XL) 5 MG 24 hr tablet Take 5 mg by mouth daily. 02/03/20  Yes [provider]  HUMULIN R U-500 KWIKPEN 500 UNIT/ML kwikpen INJECT 55 UNITS INTO THE SKIN 3 TIMES A DAY WITH MEALS. 02/09/21  Yes Reardon, Loree Fee J, NP  Insulin Pen Needle (GLOBAL EASE INJECT PEN NEEDLES) 31G X 5 MM MISC USE 3 TIMES A DAY OR AS DIRECTED. 02/24/21  Yes Reardon, Whitney J, NP  Insulin Pen Needle (PEN NEEDLES) 31G X 6 MM MISC 1 each by Does not apply route 4 (four) times daily. 03/07/19  Yes Nida, Marella Chimes, MD  lisinopril-hydrochlorothiazide (PRINZIDE,ZESTORETIC) 20-12.5 MG tablet Take 2 tablets by mouth daily.    Yes [provider]  metoCLOPramide (REGLAN) 5 MG tablet Take 5 mg by mouth in the morning and at bedtime. 01/31/20  Yes [provider]  ondansetron (ZOFRAN) 8 MG tablet Take 1 tablet (8 mg total) by mouth every 8 (eight) hours as needed for nausea or vomiting. 01/02/20  Yes Harper, Kristen S, PA-C  ONETOUCH VERIO test strip USE 1 STRIP TO CHECK GLUCOSE 4 TIMES DAILY. 02/17/20  Yes Cassandria Anger, MD  pravastatin (PRAVACHOL) 40 MG tablet Take 1 tablet (40 mg total) by mouth daily. 02/25/21  Yes Brita Romp, NP  PREMARIN vaginal cream SMARTSIG:1 Vaginal Every Night 01/24/20  Yes [provider]  pyridOXINE (VITAMIN B-6) 100 MG tablet Take 100 mg by mouth daily.    Yes [provider]  temazepam (RESTORIL) 30 MG capsule Take 30 mg by mouth at bedtime as needed. 10/27/20  Yes [provider]  venlafaxine XR (EFFEXOR-XR) 150 MG 24 hr capsule Take 150 mg by mouth daily. 10/27/20  Yes [provider]  vitamin B-12 (CYANOCOBALAMIN) 500 MCG tablet Take 500 mcg by mouth daily.   Yes [provider]  LINZESS 72 MCG capsule Take 72 mcg by mouth every morning. As needed 10/27/20   [provider]    Allergies as of 02/24/2021 - Review Complete 02/24/2021  Allergen Reaction Noted   Sulfa antibiotics Other (See Comments) 03/04/2016    Family History  Problem Relation Age of Onset   Hypertension Mother    Colon cancer Mother        diagnosed early 1s   Cerebral aneurysm Mother    Alcohol abuse Father    Pancreatic cancer Sister  deceased in 58s   Colon cancer Sister        diagnosed 68 y/o   Stroke Brother    Cancer Maternal Grandmother    Cancer Maternal Grandfather    Cancer Paternal Grandmother    Other Paternal Grandfather    Other Brother     Social History   Socioeconomic History   Marital status: Divorced    Spouse name: Not on file   Number of children: Not on file   Years of education: Not on file   Highest education level: Not on file  Occupational History   Not on file  Tobacco Use   Smoking status: Never   Smokeless tobacco: Never  Vaping Use   Vaping Use: Never used  Substance and Sexual Activity   Alcohol use: No   Drug use: No   Sexual activity: Never  Other Topics Concern   Not on file  Social History Narrative   Not on file   Social Determinants of Health   Financial Resource Strain: Not on file  Food Insecurity: Not on file  Transportation Needs: Not on file  Physical Activity: Not on file  Stress: Not on file  Social Connections: Not on file  Intimate Partner Violence: Not on file    Review of Systems: See HPI, otherwise negative ROS  Physical Exam: BP  124/75   Pulse 90   Temp 98.4 F (36.9 C) (Oral)   Resp 19   SpO2 96%  General:   Alert,  Well-developed, well-nourished, pleasant and cooperative in NAD SNeck:  Supple; no masses or thyromegaly. No significant cervical adenopathy. Lungs:  Clear throughout to auscultation.   No wheezes, crackles, or rhonchi. No acute distress. Heart:  Regular rate and rhythm; no murmurs, clicks, rubs,  or gallops. Abdomen: Non-distended, normal bowel sounds.  Soft and nontender without appreciable mass or hepatosplenomegaly.  Pulses:  Normal pulses noted. Extremities:  Without clubbing or edema.  Impression/Plan:  dysphagia. The risks, benefits, limitations, alternatives and imponderables have been reviewed with the patient. Potential for esophageal dilation, biopsy, etc. have also been reviewed.  Questions have been answered. All parties agreeable.      Notice: This dictation was prepared with Dragon dictation along with smaller phrase technology. Any transcriptional errors that result from this process are unintentional and may not be corrected upon review.

## 2021-04-16 NOTE — Anesthesia Postprocedure Evaluation (Signed)
Anesthesia Post Note  Patient: Carrie Turner  Procedure(s) Performed: ESOPHAGOGASTRODUODENOSCOPY (EGD) WITH PROPOFOL [place]  Patient location during evaluation: Phase II Anesthesia Type: General Level of consciousness: awake Pain management: pain level controlled Vital Signs Assessment: post-procedure vital signs reviewed and stable Respiratory status: spontaneous breathing and respiratory function stable Cardiovascular status: blood pressure returned to baseline and stable Postop Assessment: no headache and no apparent nausea or vomiting Anesthetic complications: no Comments: Late entry   No notable events documented.   Last Vitals:  Vitals:   04/15/21 1231 04/15/21 1444  BP: 124/75 (!) 117/53  Pulse: 90   Resp: 19   Temp: 36.9 C 36.9 C  SpO2: 96% 98%    Last Pain:  Vitals:   04/15/21 1444  TempSrc: Oral  PainSc: 0-No pain                 Louann Sjogren

## 2021-04-19 ENCOUNTER — Other Ambulatory Visit: Payer: Self-pay | Admitting: "Endocrinology

## 2021-04-19 DIAGNOSIS — Z23 Encounter for immunization: Secondary | ICD-10-CM | POA: Diagnosis not present

## 2021-04-19 DIAGNOSIS — I10 Essential (primary) hypertension: Secondary | ICD-10-CM | POA: Diagnosis not present

## 2021-04-19 DIAGNOSIS — E7800 Pure hypercholesterolemia, unspecified: Secondary | ICD-10-CM | POA: Diagnosis not present

## 2021-04-19 DIAGNOSIS — D72829 Elevated white blood cell count, unspecified: Secondary | ICD-10-CM | POA: Diagnosis not present

## 2021-04-19 DIAGNOSIS — Z299 Encounter for prophylactic measures, unspecified: Secondary | ICD-10-CM | POA: Diagnosis not present

## 2021-04-19 DIAGNOSIS — R350 Frequency of micturition: Secondary | ICD-10-CM | POA: Diagnosis not present

## 2021-04-21 ENCOUNTER — Encounter (HOSPITAL_COMMUNITY): Payer: Self-pay | Admitting: Internal Medicine

## 2021-04-29 DIAGNOSIS — D72828 Other elevated white blood cell count: Secondary | ICD-10-CM | POA: Diagnosis not present

## 2021-05-25 IMAGING — NM NM GASTRIC EMPTYING
10 series · 10 of 10 positions shown · non-contrast
Comparison: None

CLINICAL DATA: Nausea and vomiting for years, diabetes mellitus,
abdominal pain for 1 week

EXAM:
NUCLEAR MEDICINE GASTRIC EMPTYING SCAN
TECHNIQUE: After oral ingestion of radiolabeled meal, sequential abdominal
images were obtained for 4 hours. Percentage of activity emptying
the stomach was calculated at 1 hour, 2 hour, 3 hour, and 4 hours.
RADIOPHARMACEUTICALS:  2 mCi 8c-GGm sulfur colloid in standardized
meal

[Series 1: 0 min · 4.14mm/px · 1 of 1 slices shown (1 of 2)]
[im 1/1]
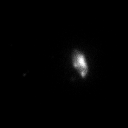

[Series 1: 0 min · 4.14mm/px · 1 of 1 slices shown (2 of 2)]
[im 1/1]
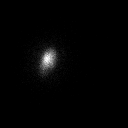

[Series 2: 60 min · 4.14mm/px · 1 of 1 slices shown (1 of 2)]
[im 1/1]
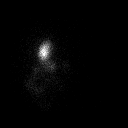

[Series 2: 60 min · 4.14mm/px · 1 of 1 slices shown (2 of 2)]
[im 1/1]
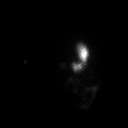

[Series 3: 120 min · 4.14mm/px · 1 of 1 slices shown (1 of 4)]
[im 1/1]
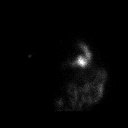

[Series 3: 120 min · 4.14mm/px · 1 of 1 slices shown (2 of 4)]
[im 1/1  full-range]
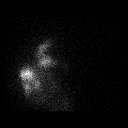

[Series 4: 120 min · 4.14mm/px · 1 of 1 slices shown (3 of 4)]
[im 1/1]
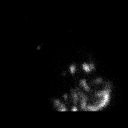

[Series 4: 120 min · 4.14mm/px · 1 of 1 slices shown (4 of 4)]
[im 1/1  full-range]
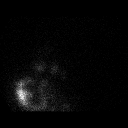

[Series 5: 4 hour · 4.14mm/px · 1 of 1 slices shown (1 of 2)]
[im 1/1]
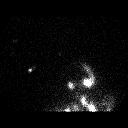

[Series 5: 4 hour · 4.14mm/px · 1 of 1 slices shown (2 of 2)]
[im 1/1  full-range]
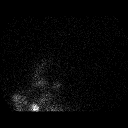

[10 of 10 positions shown; findings below may reference images not displayed]

FINDINGS: Expected location of the stomach in the left upper quadrant.

Ingested meal empties the stomach gradually over the course of the
study.

22% emptied at 1 hr ( normal >= 10%)

55% emptied at 2 hr ( normal >= 40%)

83% emptied at 3 hr ( normal >= 70%)

87% emptied at 4 hr ( normal >= 90%)
IMPRESSION: Borderline delayed gastric emptying study.

## 2021-05-28 DIAGNOSIS — E1165 Type 2 diabetes mellitus with hyperglycemia: Secondary | ICD-10-CM | POA: Diagnosis not present

## 2021-05-28 DIAGNOSIS — G4700 Insomnia, unspecified: Secondary | ICD-10-CM | POA: Diagnosis not present

## 2021-05-28 DIAGNOSIS — I10 Essential (primary) hypertension: Secondary | ICD-10-CM | POA: Diagnosis not present

## 2021-05-28 DIAGNOSIS — Z299 Encounter for prophylactic measures, unspecified: Secondary | ICD-10-CM | POA: Diagnosis not present

## 2021-06-01 DIAGNOSIS — D72828 Other elevated white blood cell count: Secondary | ICD-10-CM | POA: Diagnosis not present

## 2021-06-01 DIAGNOSIS — R3129 Other microscopic hematuria: Secondary | ICD-10-CM | POA: Diagnosis not present

## 2021-06-01 DIAGNOSIS — R300 Dysuria: Secondary | ICD-10-CM | POA: Diagnosis not present

## 2021-06-02 ENCOUNTER — Ambulatory Visit: Payer: Medicare Other | Admitting: Nurse Practitioner

## 2021-06-02 NOTE — Patient Instructions (Incomplete)
Advice for Weight Management  -For most of us the best way to lose weight is by diet management. Generally speaking, diet management means consuming less calories intentionally which over time brings about progressive weight loss.  This can be achieved more effectively by restricting carbohydrate consumption to the minimum possible.  So, it is critically important to know your numbers: how much calorie you are consuming and how much calorie you need. More importantly, our carbohydrates sources should be unprocessed or minimally processed complex starch food items.   Sometimes, it is important to balance nutrition by increasing protein intake (animal or plant source), fruits, and vegetables.  -Sticking to a routine mealtime to eat 3 meals a day and avoiding unnecessary snacks is shown to have a big role in weight control. Under normal circumstances, the only time we lose real weight is when we are hungry, so allow hunger to take place- hunger means no food between meal times, only water.  It is not advisable to starve.   -It is better to avoid simple carbohydrates including: Cakes, Sweet Desserts, Ice Cream, Soda (diet and regular), Sweet Tea, Candies, Chips, Cookies, Store Bought Juices, Alcohol in Excess of  1-2 drinks a day, Artificial Sweeteners, Doughnuts, Coffee Creamers, "Sugar-free" Products, etc, etc.  This is not a complete list.....    -Consulting with certified diabetes educators is proven to provide you with the most accurate and current information on diet.  Also, you may be  interested in discussing diet options/exchanges , we can schedule a visit with Carrie Turner, RDN, CDE for individualized nutrition education.  -Exercise: If you are able: 30 -60 minutes a day ,4 days a week, or 150 minutes a week.  The longer the better.  Combine stretch, strength, and aerobic activities.  If you were told in the past that you have high risk for cardiovascular diseases, you may seek evaluation by  your heart doctor prior to initiating moderate to intense exercise programs.    

## 2021-06-09 DIAGNOSIS — Z299 Encounter for prophylactic measures, unspecified: Secondary | ICD-10-CM | POA: Diagnosis not present

## 2021-06-09 DIAGNOSIS — J449 Chronic obstructive pulmonary disease, unspecified: Secondary | ICD-10-CM | POA: Diagnosis not present

## 2021-06-09 DIAGNOSIS — D72829 Elevated white blood cell count, unspecified: Secondary | ICD-10-CM | POA: Diagnosis not present

## 2021-06-09 DIAGNOSIS — I10 Essential (primary) hypertension: Secondary | ICD-10-CM | POA: Diagnosis not present

## 2021-06-22 ENCOUNTER — Other Ambulatory Visit: Payer: Self-pay | Admitting: Nurse Practitioner

## 2021-06-23 NOTE — Patient Instructions (Signed)

## 2021-06-24 ENCOUNTER — Encounter: Payer: Self-pay | Admitting: Nurse Practitioner

## 2021-06-24 ENCOUNTER — Ambulatory Visit (INDEPENDENT_AMBULATORY_CARE_PROVIDER_SITE_OTHER): Payer: Medicare Other | Admitting: Nurse Practitioner

## 2021-06-24 ENCOUNTER — Other Ambulatory Visit: Payer: Self-pay

## 2021-06-24 VITALS — BP 166/89 | Ht 62.0 in | Wt 173.0 lb

## 2021-06-24 DIAGNOSIS — E1165 Type 2 diabetes mellitus with hyperglycemia: Secondary | ICD-10-CM | POA: Diagnosis not present

## 2021-06-24 DIAGNOSIS — Z794 Long term (current) use of insulin: Secondary | ICD-10-CM | POA: Diagnosis not present

## 2021-06-24 DIAGNOSIS — I10 Essential (primary) hypertension: Secondary | ICD-10-CM | POA: Diagnosis not present

## 2021-06-24 DIAGNOSIS — Z91199 Patient's noncompliance with other medical treatment and regimen due to unspecified reason: Secondary | ICD-10-CM

## 2021-06-24 DIAGNOSIS — E782 Mixed hyperlipidemia: Secondary | ICD-10-CM

## 2021-06-24 DIAGNOSIS — E559 Vitamin D deficiency, unspecified: Secondary | ICD-10-CM

## 2021-06-24 LAB — POCT GLYCOSYLATED HEMOGLOBIN (HGB A1C): HbA1c POC (<> result, manual entry): 8.2 % (ref 4.0–5.6)

## 2021-06-24 MED ORDER — TRESIBA FLEXTOUCH 100 UNIT/ML ~~LOC~~ SOPN: 60.0000 [IU] | PEN_INJECTOR | Freq: Every day | SUBCUTANEOUS | 3 refills | Status: DC

## 2021-06-24 NOTE — Progress Notes (Signed)
06/24/2021  Endocrinology follow-up note  Subjective:    Patient ID: Carrie Turner, female    DOB: 07-21-1951.  She is being seen in follow-up  for management of diabetes requested by Monico Blitz, MD  Past Medical History:  Diagnosis Date   Anxiety    Arthritis    COPD (chronic obstructive pulmonary disease) ([place])    Delayed gastric emptying    GES completed on 08/27/2019 revealed borderline delayed gastric emptying; 87% emptying at 4 hours (normal greater than 90%)   Dementia ([place]) 09/30/2019   per patient, early dementia diagnosed after seeing PCP recently   Depression    Diabetes mellitus, type II ([place])    GERD (gastroesophageal reflux disease)    Glaucoma    Hyperlipidemia    Hypertension    Migraines    Neuropathy    Seizures ([place])    seizure was from ETOH, "a long time ago", no med and no recurrance   Sleep apnea    Vitamin D deficiency     Past Surgical History:  Procedure Laterality Date   ABDOMINAL HYSTERECTOMY     CATARACT EXTRACTION W/PHACO Left 07/24/2019   Procedure: CATARACT EXTRACTION PHACO AND INTRAOCULAR LENS PLACEMENT LEFT EYE  (CDE: 5.60);  Surgeon: Baruch Goldmann, MD;  Location: [facility];  Service: Ophthalmology;  Laterality: Left;   CATARACT EXTRACTION W/PHACO Right 08/19/2019   Procedure: CATARACT EXTRACTION PHACO AND INTRAOCULAR LENS PLACEMENT RIGHT EYE;  Surgeon: Baruch Goldmann, MD;  Location: [facility];  Service: Ophthalmology;  Laterality: Right;  CDE: 8.83   CHOLECYSTECTOMY N/A 12/25/2019   Procedure: LAPAROSCOPIC CHOLECYSTECTOMY;  Surgeon: Aviva Signs, MD;  Location: [facility];  Service: General;  Laterality: N/A;   COLONOSCOPY  11/2015   Dr. Britta Mccreedy: sessile polyp removed (benign). advised repeat colonoscopy in 5 years.    COLONOSCOPY WITH PROPOFOL N/A 10/26/2017   RMR: Tubular adenoma removed.  Random colon biopsies negative.  Nonbleeding internal hemorrhoids.  Next colonoscopy in 5 years.   ESOPHAGEAL DILATION  10/26/2017   Procedure: ESOPHAGEAL DILATION;   Surgeon: Daneil Dolin, MD;  [facility];  Service: Endoscopy;;   ESOPHAGOGASTRODUODENOSCOPY  11/2015   Dr. Britta Mccreedy: hiatal hernia   ESOPHAGOGASTRODUODENOSCOPY (EGD) WITH PROPOFOL N/A 10/26/2017   RMR: Erosive reflux esophagitis, hiatal hernia.  Small bowel biopsies negative.   ESOPHAGOGASTRODUODENOSCOPY (EGD) WITH PROPOFOL N/A 04/15/2021   Procedure: ESOPHAGOGASTRODUODENOSCOPY (EGD) WITH PROPOFOL;  Surgeon: Daneil Dolin, MD;  [facility];  Service: Endoscopy;  Laterality: N/A;  8:45am - LM to see if pt could move up [place] (ISTENT) Left 07/24/2019   Procedure: INSERTION OF ANTERIOR SEGMENT AQUEOUS DRAINAGE DEVICE (ISTENT) LEFT EYE;  Surgeon: Baruch Goldmann, MD;  Location: [facility];  Service: Ophthalmology;  Laterality: Left;   INSERTION OF ANTERIOR SEGMENT AQUEOUS DRAINAGE DEVICE (ISTENT) Right 08/19/2019   Procedure: INSERTION OF ANTERIOR SEGMENT AQUEOUS DRAINAGE DEVICE (ISTENT) RIGHT EYE;  Surgeon: Baruch Goldmann, MD;  Location: [facility];  Service: Ophthalmology;  Laterality: Right;   KNEE ARTHROSCOPY Right    MALONEY DILATION N/A 04/15/2021   Procedure: Venia Minks DILATION;  Surgeon: Daneil Dolin, MD;  [facility];  Service: Endoscopy;  Laterality: N/A;   ORIF ANKLE FRACTURE Right      Social History   Socioeconomic History   Marital status: Divorced    Spouse name: Not on file   Number of children: Not on file   Years of education: Not on file  Highest education level: Not on file  Occupational History   Not on file  Tobacco Use   Smoking status: Never   Smokeless tobacco: Never  Vaping Use   Vaping Use: Never used  Substance and Sexual Activity   Alcohol use: No   Drug use: No   Sexual activity: Never  Other Topics Concern   Not on file  Social History Narrative   Not on file   Social Determinants of Health   Financial Resource Strain: Not on file  Food Insecurity: Not on file   Transportation Needs: Not on file  Physical Activity: Not on file  Stress: Not on file  Social Connections: Not on file  Intimate Partner Violence: Not on file    Current Outpatient Medications on File Prior to Visit  Medication Sig Dispense Refill   albuterol (VENTOLIN HFA) 108 (90 Base) MCG/ACT inhaler Inhale 1-2 puffs into the lungs every 6 (six) hours as needed for wheezing or shortness of breath.     amLODipine (NORVASC) 5 MG tablet Take 5 mg by mouth daily.      aspirin EC 81 MG tablet Take 81 mg by mouth daily.     Blood Glucose Monitoring Suppl (ONETOUCH VERIO) w/Device KIT 1 each by Does not apply route in the morning, at noon, in the evening, and at bedtime. 1 kit 0   Cholecalciferol (VITAMIN D3) 2000 units TABS Take 2,000 Units by mouth daily.     citalopram (CELEXA) 40 MG tablet Take 40 mg by mouth daily.     Continuous Blood Gluc Sensor (FREESTYLE LIBRE 14 DAY SENSOR) MISC Inject 1 each into the skin every 14 (fourteen) days. Use as directed. 2 each 6   dexlansoprazole (DEXILANT) 60 MG capsule TAKE 1 CAPSULE BY MOUTH ONCE DAILY. 90 capsule 3   donepezil (ARICEPT) 5 MG tablet Take 5 mg by mouth at bedtime.     eszopiclone (LUNESTA) 2 MG TABS tablet Take 2 mg by mouth at bedtime as needed for sleep. Take immediately before bedtime     fenofibrate (TRICOR) 145 MG tablet Take 1 tablet (145 mg total) by mouth daily. 90 tablet 3   gabapentin (NEURONTIN) 800 MG tablet Take 800 mg by mouth 3 (three) times daily.     glipiZIDE (GLUCOTROL XL) 5 MG 24 hr tablet Take 5 mg by mouth daily.     Insulin Pen Needle (GLOBAL EASE INJECT PEN NEEDLES) 31G X 5 MM MISC USE 3 TIMES A DAY OR AS DIRECTED. 100 each 2   Insulin Pen Needle (PEN NEEDLES) 31G X 6 MM MISC 1 each by Does not apply route 4 (four) times daily. 400 each 1   LINZESS 72 MCG capsule Take 72 mcg by mouth every morning. As needed     lisinopril-hydrochlorothiazide (PRINZIDE,ZESTORETIC) 20-12.5 MG tablet Take 2 tablets by mouth daily.       metoCLOPramide (REGLAN) 5 MG tablet Take 5 mg by mouth in the morning and at bedtime.     ondansetron (ZOFRAN) 8 MG tablet Take 1 tablet (8 mg total) by mouth every 8 (eight) hours as needed for nausea or vomiting. 30 tablet 0   ONETOUCH VERIO test strip USE 1 STRIP TO CHECK GLUCOSE 4 TIMES DAILY. 200 strip 1   pravastatin (PRAVACHOL) 40 MG tablet Take 1 tablet (40 mg total) by mouth daily. 90 tablet 3   PREMARIN vaginal cream SMARTSIG:1 Vaginal Every Night     pyridOXINE (VITAMIN B-6) 100 MG tablet Take 100 mg by mouth  daily.     temazepam (RESTORIL) 30 MG capsule Take 30 mg by mouth at bedtime as needed.     venlafaxine XR (EFFEXOR-XR) 150 MG 24 hr capsule Take 150 mg by mouth daily.     vitamin B-12 (CYANOCOBALAMIN) 500 MCG tablet Take 500 mcg by mouth daily.     No current facility-administered medications on file prior to visit.    No facility-administered encounter medications on file as of 10/16/2018.    Allergies  Allergen Reactions   Sulfa Antibiotics Other (See Comments)    Weakness and fatigued      Diabetes She presents for her follow-up diabetic visit. She has type 2 diabetes mellitus. Onset time: diagnosed at approximate age of 85. Her disease course has been improving. There are no hypoglycemic associated symptoms. Pertinent negatives for hypoglycemia include no confusion, headaches, mood changes or seizures. Associated symptoms include blurred vision, fatigue, foot paresthesias and weight loss. Pertinent negatives for diabetes include no chest pain, no polydipsia, no polyphagia and no polyuria. There are no hypoglycemic complications. Symptoms are stable. Diabetic complications include peripheral neuropathy. Risk factors for coronary artery disease include diabetes mellitus, dyslipidemia, hypertension, obesity, post-menopausal and sedentary lifestyle. Current diabetic treatment includes intensive insulin program and oral agent (monotherapy). She is compliant with treatment  some of the time (only takes the u500 once daily as she only eats 1 meal per day). Her weight is decreasing steadily. She is following a generally unhealthy diet. When asked about meal planning, she reported none. She has had a previous visit with a dietitian. She rarely participates in exercise. Her dinner blood glucose range is generally >200 mg/dl. (She presents today with her meters, and logs, showing fluctuating glycemic profile.  She only checks glucose once daily, before supper (this is the only meal she eats per day).  Her POCT A1c today is 8.2%, improving from last visit of 9.2%.  She has significant GI issues and cannot eat more than 1 meal per day, per her report.  She denies any significant hypoglycemia.) An ACE inhibitor/angiotensin II receptor blocker is being taken. She does not see a podiatrist.Eye exam is current.  Hyperlipidemia This is a chronic problem. The current episode started more than 1 year ago. The problem is resistant. Recent lipid tests were reviewed and are high. Exacerbating diseases include diabetes and obesity. Factors aggravating her hyperlipidemia include thiazides. Pertinent negatives include no chest pain, leg pain, myalgias or shortness of breath. Current antihyperlipidemic treatment includes fibric acid derivatives and statins. The current treatment provides mild improvement of lipids. Compliance problems include adherence to exercise, adherence to diet and psychosocial issues.  Risk factors for coronary artery disease include diabetes mellitus, dyslipidemia, hypertension, obesity, post-menopausal and a sedentary lifestyle.  Hypertension This is a chronic problem. The current episode started more than 1 year ago. The problem has been waxing and waning since onset. The problem is uncontrolled. Associated symptoms include blurred vision. Pertinent negatives include no chest pain, headaches or shortness of breath. There are no associated agents to hypertension. Risk factors  for coronary artery disease include diabetes mellitus, sedentary lifestyle, obesity, dyslipidemia and post-menopausal state. Past treatments include calcium channel blockers, ACE inhibitors and diuretics. The current treatment provides mild improvement. Compliance problems include diet, exercise and psychosocial issues.     Review of systems  Constitutional: + Minimally fluctuating body weight,  current Body mass index is 31.64 kg/m. , + fatigue, no subjective hyperthermia, no subjective hypothermia Eyes: no blurry vision, no xerophthalmia ENT: no sore  throat, no nodules palpated in throat, no dysphagia/odynophagia, no hoarseness Cardiovascular: no chest pain, no shortness of breath, no palpitations, no leg swelling Respiratory: no cough, no shortness of breath Gastrointestinal: mild intermittent nausea/vomiting/diarrhea associated with gastroparesis Musculoskeletal: no muscle/joint aches Skin: no rashes, no hyperemia Neurological: no tremors, + numbness/tingling to BLE, no dizziness Psychiatric: no depression, no anxiety   Objective:    BP (!) 166/89   Ht 5' 2"  (1.575 m)   Wt 173 lb (78.5 kg)   BMI 31.64 kg/m   Wt Readings from Last 3 Encounters:  06/24/21 173 lb (78.5 kg)  04/06/21 175 lb (79.4 kg)  02/25/21 179 lb (81.2 kg)   BP Readings from Last 3 Encounters:  06/24/21 (!) 166/89  04/15/21 (!) 117/53  04/06/21 (!) 155/97     Physical Exam- Limited  Constitutional:  Body mass index is 31.64 kg/m. , not in acute distress, normal state of mind Eyes:  EOMI, no exophthalmos Neck: Supple Cardiovascular: RRR, no murmurs, rubs, or gallops, no edema Respiratory: Adequate breathing efforts, no crackles, rales, rhonchi, or wheezing Musculoskeletal: no gross deformities, strength intact in all four extremities, no gross restriction of joint movements Skin:  no rashes, no hyperemia Neurological: no tremor with outstretched hands  Diabetic Foot Exam - Simple   Simple Foot  Form Visual Inspection No deformities, no ulcerations, no other skin breakdown bilaterally: Yes Sensation Testing See comments: Yes Pulse Check Posterior Tibialis and Dorsalis pulse intact bilaterally: Yes Comments Decreased sensation to monofilament tool bilaterally     Lipid Panel     Component Value Date/Time   CHOL 383 (A) 02/17/2021 0000   TRIG 373 (A) 02/17/2021 0000   HDL 61 02/17/2021 0000   LDLCALC 247 02/17/2021 0000     Assessment & Plan:   1) Uncontrolled type 2 diabetes mellitus with complication, with long-term current use of insulin (HCC)  - Patient has currently uncontrolled symptomatic type 2 DM since 70 years of age.  She presents today with her meters, and logs, showing fluctuating glycemic profile.  She only checks glucose once daily, before supper (this is the only meal she eats per day).  Her POCT A1c today is 8.2%, improving from last visit of 9.2%.  She has significant GI issues and cannot eat more than 1 meal per day, per her report.  She denies any significant hypoglycemia.   - Recent labs are reviewed.   Her diabetes is complicated by noncompliance/nonadherence, obesity and sedentary life and patient remains at a high risk for more acute and chronic complications of diabetes which include CAD, CVA, CKD, retinopathy, and neuropathy. These are all discussed in detail with the patient.  - Nutritional counseling repeated at each appointment due to patients tendency to fall back in to old habits.  - The patient admits there is a room for improvement in their diet and drink choices. -  Suggestion is made for the patient to avoid simple carbohydrates from their diet including Cakes, Sweet Desserts / Pastries, Ice Cream, Soda (diet and regular), Sweet Tea, Candies, Chips, Cookies, Sweet Pastries, Store Bought Juices, Alcohol in Excess of 1-2 drinks a day, Artificial Sweeteners, Coffee Creamer, and "Sugar-free" Products. This will help patient to have stable  blood glucose profile and potentially avoid unintended weight gain.   - I encouraged the patient to switch to unprocessed or minimally processed complex starch and increased protein intake (animal or plant source), fruits, and vegetables.   - Patient is advised to stick to a routine mealtimes  to eat 3 meals a day and avoid unnecessary snacks (to snack only to correct hypoglycemia).  - I have approached patient with the following individualized plan to manage diabetes and patient agrees:   -she is struggling to achieve control of diabetes, likely deals with moderate cognitive deficit. Avoiding hypoglycemia is the #1 priority in her case.   -She will benefit from switching from the U500 back to basal insulin to give her more consistency with her levels throughout the day.  I discussed and changed her to Tresiba 60 units SQ nightly.  She can continue her Glipizide 5 mg XL po daily with breakfast for now.   -She does not tolerate Metformin due to GI side effects.  -She is encouraged to start consistently monitoring blood glucose twice daily, before breakfast and before bed, and to call the clinic if she has readings less than 70 or above 300 for 3 tests in a row.  -She is not a good candidate for incretin therapy due to her hypertriglyceridemia increasing her risk of pancreatitis.    2) Lipids/HPL:  Her most recent lipid panel from 02/17/21 shows uncontrolled LDL of 247 and improved triglycerides of 373.  She is advised to continue Pravastatin 40 mg po daily at bedtime and continue Fenofibrate 145 mg po daily.  3) Hypertension: -Her blood pressure is not controlled to target.  She is advised to continue Norvasc 5 mg po daily, Lisinopril-HCT 40-25 mg po daily and follow up with Dr. Manuella Ghazi regarding her meds.   She is advised to continue follow-up closely with Dr. Manuella Ghazi for primary care needs.      I spent 40 minutes in the care of the patient today including review of labs from [place], Lipids,  Thyroid Function, Hematology (current and previous including abstractions from other facilities); face-to-face time discussing  her blood glucose readings/logs, discussing hypoglycemia and hyperglycemia episodes and symptoms, medications doses, her options of short and long term treatment based on the latest standards of care / guidelines;  discussion about incorporating lifestyle medicine;  and documenting the encounter.    Please refer to Patient Instructions for Blood Glucose Monitoring and Insulin/Medications Dosing Guide"  in media tab for additional information. Please  also refer to " Patient Self Inventory" in the Media  tab for reviewed elements of pertinent patient history.  Carrie Turner participated in the discussions, expressed understanding, and voiced agreement with the above plans.  All questions were answered to her satisfaction. she is encouraged to contact clinic should she have any questions or concerns prior to her return visit.   Follow up plan: Return in about 1 month (around 07/25/2021) for Diabetes F/U, Bring meter and logs.   Rayetta Pigg, [facility] [address] [place], [place] [address] Phone: [phone] Fax: [phone]

## 2021-06-30 DIAGNOSIS — I10 Essential (primary) hypertension: Secondary | ICD-10-CM | POA: Diagnosis not present

## 2021-06-30 DIAGNOSIS — Z789 Other specified health status: Secondary | ICD-10-CM | POA: Diagnosis not present

## 2021-06-30 DIAGNOSIS — G4700 Insomnia, unspecified: Secondary | ICD-10-CM | POA: Diagnosis not present

## 2021-06-30 DIAGNOSIS — E1165 Type 2 diabetes mellitus with hyperglycemia: Secondary | ICD-10-CM | POA: Diagnosis not present

## 2021-06-30 DIAGNOSIS — Z299 Encounter for prophylactic measures, unspecified: Secondary | ICD-10-CM | POA: Diagnosis not present

## 2021-07-13 ENCOUNTER — Encounter: Payer: Self-pay | Admitting: Internal Medicine

## 2021-07-13 ENCOUNTER — Ambulatory Visit: Payer: Medicare Other | Admitting: Internal Medicine

## 2021-07-19 ENCOUNTER — Other Ambulatory Visit: Payer: Self-pay | Admitting: Nurse Practitioner

## 2021-07-21 ENCOUNTER — Other Ambulatory Visit: Payer: Self-pay

## 2021-07-21 MED ORDER — TRESIBA FLEXTOUCH 100 UNIT/ML ~~LOC~~ SOPN: 60.0000 [IU] | PEN_INJECTOR | Freq: Every day | SUBCUTANEOUS | 1 refills | Status: DC

## 2021-07-27 DIAGNOSIS — H5213 Myopia, bilateral: Secondary | ICD-10-CM | POA: Diagnosis not present

## 2021-07-27 DIAGNOSIS — Z961 Presence of intraocular lens: Secondary | ICD-10-CM | POA: Diagnosis not present

## 2021-07-28 ENCOUNTER — Ambulatory Visit (INDEPENDENT_AMBULATORY_CARE_PROVIDER_SITE_OTHER): Payer: Commercial Managed Care - HMO | Admitting: Nurse Practitioner

## 2021-07-28 ENCOUNTER — Encounter: Payer: Self-pay | Admitting: Nurse Practitioner

## 2021-07-28 ENCOUNTER — Other Ambulatory Visit: Payer: Self-pay

## 2021-07-28 VITALS — BP 143/89 | HR 107 | Ht 62.0 in | Wt 176.6 lb

## 2021-07-28 DIAGNOSIS — E1165 Type 2 diabetes mellitus with hyperglycemia: Secondary | ICD-10-CM

## 2021-07-28 DIAGNOSIS — E782 Mixed hyperlipidemia: Secondary | ICD-10-CM

## 2021-07-28 DIAGNOSIS — I10 Essential (primary) hypertension: Secondary | ICD-10-CM | POA: Diagnosis not present

## 2021-07-28 DIAGNOSIS — Z794 Long term (current) use of insulin: Secondary | ICD-10-CM | POA: Diagnosis not present

## 2021-07-28 DIAGNOSIS — Z91199 Patient's noncompliance with other medical treatment and regimen due to unspecified reason: Secondary | ICD-10-CM

## 2021-07-28 DIAGNOSIS — E559 Vitamin D deficiency, unspecified: Secondary | ICD-10-CM

## 2021-07-28 NOTE — Progress Notes (Signed)
07/28/2021  Endocrinology follow-up note  Subjective:    Patient ID: Carrie Turner, female    DOB: June 27, 1951.  She is being seen in follow-up  for management of diabetes requested by Monico Blitz, MD  Past Medical History:  Diagnosis Date   Anxiety    Arthritis    COPD (chronic obstructive pulmonary disease) ([place])    Delayed gastric emptying    GES completed on 08/27/2019 revealed borderline delayed gastric emptying; 87% emptying at 4 hours (normal greater than 90%)   Dementia ([place]) 09/30/2019   per patient, early dementia diagnosed after seeing PCP recently   Depression    Diabetes mellitus, type II ([place])    GERD (gastroesophageal reflux disease)    Glaucoma    Hyperlipidemia    Hypertension    Migraines    Neuropathy    Seizures ([place])    seizure was from ETOH, "a long time ago", no med and no recurrance   Sleep apnea    Vitamin D deficiency     Past Surgical History:  Procedure Laterality Date   ABDOMINAL HYSTERECTOMY     CATARACT EXTRACTION W/PHACO Left 07/24/2019   Procedure: CATARACT EXTRACTION PHACO AND INTRAOCULAR LENS PLACEMENT LEFT EYE  (CDE: 5.60);  Surgeon: Baruch Goldmann, MD;  Location: [facility];  Service: Ophthalmology;  Laterality: Left;   CATARACT EXTRACTION W/PHACO Right 08/19/2019   Procedure: CATARACT EXTRACTION PHACO AND INTRAOCULAR LENS PLACEMENT RIGHT EYE;  Surgeon: Baruch Goldmann, MD;  Location: [facility];  Service: Ophthalmology;  Laterality: Right;  CDE: 8.83   CHOLECYSTECTOMY N/A 12/25/2019   Procedure: LAPAROSCOPIC CHOLECYSTECTOMY;  Surgeon: Aviva Signs, MD;  Location: [facility];  Service: General;  Laterality: N/A;   COLONOSCOPY  11/2015   Dr. Britta Mccreedy: sessile polyp removed (benign). advised repeat colonoscopy in 5 years.    COLONOSCOPY WITH PROPOFOL N/A 10/26/2017   RMR: Tubular adenoma removed.  Random colon biopsies negative.  Nonbleeding internal hemorrhoids.  Next colonoscopy in 5 years.   ESOPHAGEAL DILATION  10/26/2017   Procedure: ESOPHAGEAL DILATION;   Surgeon: Daneil Dolin, MD;  [facility]: [facility];  Service: Endoscopy;;   ESOPHAGOGASTRODUODENOSCOPY  11/2015   Dr. Britta Mccreedy: hiatal hernia   ESOPHAGOGASTRODUODENOSCOPY (EGD) WITH PROPOFOL N/A 10/26/2017   RMR: Erosive reflux esophagitis, hiatal hernia.  Small bowel biopsies negative.   ESOPHAGOGASTRODUODENOSCOPY (EGD) WITH PROPOFOL N/A 04/15/2021   Procedure: ESOPHAGOGASTRODUODENOSCOPY (EGD) WITH PROPOFOL;  Surgeon: Daneil Dolin, MD;  [facility];  Service: Endoscopy;  Laterality: N/A;  8:45am - LM to see if pt could move up [place] (ISTENT) Left 07/24/2019   Procedure: INSERTION OF ANTERIOR SEGMENT AQUEOUS DRAINAGE DEVICE (ISTENT) LEFT EYE;  Surgeon: Baruch Goldmann, MD;  Location: [facility];  Service: Ophthalmology;  Laterality: Left;   INSERTION OF ANTERIOR SEGMENT AQUEOUS DRAINAGE DEVICE (ISTENT) Right 08/19/2019   Procedure: INSERTION OF ANTERIOR SEGMENT AQUEOUS DRAINAGE DEVICE (ISTENT) RIGHT EYE;  Surgeon: Baruch Goldmann, MD;  Location: [facility];  Service: Ophthalmology;  Laterality: Right;   KNEE ARTHROSCOPY Right    MALONEY DILATION N/A 04/15/2021   Procedure: Venia Minks DILATION;  Surgeon: Daneil Dolin, MD;  [facility];  Service: Endoscopy;  Laterality: N/A;   ORIF ANKLE FRACTURE Right      Social History   Socioeconomic History   Marital status: Divorced    Spouse name: Not on file   Number of children: Not on file   Years of education: Not on file  Highest education level: Not on file  Occupational History   Not on file  Tobacco Use   Smoking status: Never   Smokeless tobacco: Never  Vaping Use   Vaping Use: Never used  Substance and Sexual Activity   Alcohol use: No   Drug use: No   Sexual activity: Never  Other Topics Concern   Not on file  Social History Narrative   Not on file   Social Determinants of Health   Financial Resource Strain: Not on file  Food Insecurity: Not on file   Transportation Needs: Not on file  Physical Activity: Not on file  Stress: Not on file  Social Connections: Not on file  Intimate Partner Violence: Not on file    Current Outpatient Medications on File Prior to Visit  Medication Sig Dispense Refill   albuterol (VENTOLIN HFA) 108 (90 Base) MCG/ACT inhaler Inhale 1-2 puffs into the lungs every 6 (six) hours as needed for wheezing or shortness of breath.     amLODipine (NORVASC) 2.5 MG tablet Take 2.5 mg by mouth daily.     amLODipine (NORVASC) 5 MG tablet Take 5 mg by mouth daily.      aspirin EC 81 MG tablet Take 81 mg by mouth daily.     Blood Glucose Monitoring Suppl (ONETOUCH VERIO) w/Device KIT 1 each by Does not apply route in the morning, at noon, in the evening, and at bedtime. 1 kit 0   Cholecalciferol (VITAMIN D3) 2000 units TABS Take 2,000 Units by mouth daily.     citalopram (CELEXA) 40 MG tablet Take 40 mg by mouth daily.     dexlansoprazole (DEXILANT) 60 MG capsule TAKE 1 CAPSULE BY MOUTH ONCE DAILY. 90 capsule 3   donepezil (ARICEPT) 10 MG tablet Take 10 mg by mouth daily.     eszopiclone (LUNESTA) 2 MG TABS tablet Take 2 mg by mouth at bedtime as needed for sleep. Take immediately before bedtime     fenofibrate (TRICOR) 145 MG tablet Take 1 tablet (145 mg total) by mouth daily. 90 tablet 3   gabapentin (NEURONTIN) 800 MG tablet Take 800 mg by mouth 3 (three) times daily.     glipiZIDE (GLUCOTROL XL) 5 MG 24 hr tablet Take 5 mg by mouth daily.     insulin degludec (TRESIBA FLEXTOUCH) 100 UNIT/ML FlexTouch Pen Inject 60 Units into the skin at bedtime. 30 mL 1   Insulin Pen Needle (GLOBAL EASE INJECT PEN NEEDLES) 31G X 5 MM MISC USE 3 TIMES A DAY OR AS DIRECTED. 100 each 2   Insulin Pen Needle (PEN NEEDLES) 31G X 6 MM MISC 1 each by Does not apply route 4 (four) times daily. 400 each 1   latanoprost (XALATAN) 0.005 % ophthalmic solution SMARTSIG:In Eye(s)     LINZESS 72 MCG capsule Take 72 mcg by mouth every morning. As needed      lisinopril-hydrochlorothiazide (PRINZIDE,ZESTORETIC) 20-12.5 MG tablet Take 2 tablets by mouth daily.      metoCLOPramide (REGLAN) 5 MG tablet Take 5 mg by mouth in the morning and at bedtime.     ondansetron (ZOFRAN) 8 MG tablet Take 1 tablet (8 mg total) by mouth every 8 (eight) hours as needed for nausea or vomiting. 30 tablet 0   ONETOUCH VERIO test strip USE 1 STRIP TO CHECK GLUCOSE 4 TIMES DAILY. 200 strip 1   potassium chloride (KLOR-CON M) 10 MEQ tablet Take 10 mEq by mouth daily.     pravastatin (PRAVACHOL) 40 MG tablet  Take 1 tablet (40 mg total) by mouth daily. 90 tablet 3   PREMARIN vaginal cream SMARTSIG:1 Vaginal Every Night     pyridOXINE (VITAMIN B-6) 100 MG tablet Take 100 mg by mouth daily.     rosuvastatin (CRESTOR) 10 MG tablet Take 10 mg by mouth at bedtime.     temazepam (RESTORIL) 30 MG capsule Take 30 mg by mouth at bedtime as needed.     traZODone (DESYREL) 50 MG tablet Take 50 mg by mouth at bedtime.     venlafaxine XR (EFFEXOR-XR) 150 MG 24 hr capsule Take 150 mg by mouth daily.     vitamin B-12 (CYANOCOBALAMIN) 500 MCG tablet Take 500 mcg by mouth daily.     No current facility-administered medications on file prior to visit.    No facility-administered encounter medications on file as of 10/16/2018.    Allergies  Allergen Reactions   Sulfa Antibiotics Other (See Comments)    Weakness and fatigued      Diabetes She presents for her follow-up diabetic visit. She has type 2 diabetes mellitus. Onset time: diagnosed at approximate age of 34. Her disease course has been improving. There are no hypoglycemic associated symptoms. Pertinent negatives for hypoglycemia include no confusion, headaches, mood changes or seizures. Associated symptoms include blurred vision, fatigue and foot paresthesias. Pertinent negatives for diabetes include no chest pain, no polydipsia, no polyphagia, no polyuria and no weight loss. There are no hypoglycemic complications. Symptoms are  improving. Diabetic complications include peripheral neuropathy. Risk factors for coronary artery disease include diabetes mellitus, dyslipidemia, hypertension, obesity, post-menopausal and sedentary lifestyle. Current diabetic treatment includes oral agent (monotherapy) and insulin injections. She is compliant with treatment most of the time. Her weight is fluctuating minimally. She is following a generally unhealthy diet. When asked about meal planning, she reported none. She has had a previous visit with a dietitian. She rarely participates in exercise. Her home blood glucose trend is decreasing steadily. Her overall blood glucose range is 140-180 mg/dl. (She presents today with her meter and logs showing greatly improved glycemic profile overall.  She was not due for another A1c today.  Her PCP did start her on [place] about 2 weeks ago.  She denies any hypoglycemia.  Analysis of her meter shows 7-day average of 151, 14-day average of 177, 30-day average of 200, and 90-day average of 210.) An ACE inhibitor/angiotensin II receptor blocker is being taken. She does not see a podiatrist.Eye exam is current.  Hyperlipidemia This is a chronic problem. The current episode started more than 1 year ago. The problem is resistant. Recent lipid tests were reviewed and are high. Exacerbating diseases include diabetes and obesity. Factors aggravating her hyperlipidemia include thiazides. Pertinent negatives include no chest pain, leg pain, myalgias or shortness of breath. Current antihyperlipidemic treatment includes fibric acid derivatives and statins. The current treatment provides mild improvement of lipids. Compliance problems include adherence to exercise, adherence to diet and psychosocial issues.  Risk factors for coronary artery disease include diabetes mellitus, dyslipidemia, hypertension, obesity, post-menopausal and a sedentary lifestyle.  Hypertension This is a chronic problem. The current episode started more  than 1 year ago. The problem has been waxing and waning since onset. The problem is uncontrolled. Associated symptoms include blurred vision. Pertinent negatives include no chest pain, headaches or shortness of breath. There are no associated agents to hypertension. Risk factors for coronary artery disease include diabetes mellitus, sedentary lifestyle, obesity, dyslipidemia and post-menopausal state. Past treatments include calcium channel blockers, ACE  inhibitors and diuretics. The current treatment provides mild improvement. Compliance problems include diet, exercise and psychosocial issues.     Review of systems  Constitutional: + Minimally fluctuating body weight,  current Body mass index is 32.3 kg/m. , + fatigue-improving, no subjective hyperthermia, no subjective hypothermia Eyes: no blurry vision, no xerophthalmia ENT: no sore throat, no nodules palpated in throat, no dysphagia/odynophagia, no hoarseness Cardiovascular: no chest pain, no shortness of breath, no palpitations, no leg swelling Respiratory: no cough, no shortness of breath Gastrointestinal: mild intermittent nausea/vomiting/diarrhea associated with gastroparesis Musculoskeletal: no muscle/joint aches Skin: no rashes, no hyperemia Neurological: no tremors, + numbness/tingling to BLE, no dizziness Psychiatric: no depression, no anxiety   Objective:    BP (!) 143/89    Pulse (!) 107    Ht 5' 2"  (1.575 m)    Wt 176 lb 9.6 oz (80.1 kg)    SpO2 96%    BMI 32.30 kg/m   Wt Readings from Last 3 Encounters:  07/28/21 176 lb 9.6 oz (80.1 kg)  06/24/21 173 lb (78.5 kg)  04/06/21 175 lb (79.4 kg)   BP Readings from Last 3 Encounters:  07/28/21 (!) 143/89  06/24/21 (!) 166/89  04/15/21 (!) 117/53     Physical Exam- Limited  Constitutional:  Body mass index is 32.3 kg/m. , not in acute distress, normal state of mind Eyes:  EOMI, no exophthalmos Neck: Supple Cardiovascular: RRR, no murmurs, rubs, or gallops, no  edema Respiratory: Adequate breathing efforts, no crackles, rales, rhonchi, or wheezing Musculoskeletal: no gross deformities, strength intact in all four extremities, no gross restriction of joint movements Skin:  no rashes, no hyperemia Neurological: no tremor with outstretched hands    Diabetic Foot Exam - Simple   No data filed     Lipid Panel     Component Value Date/Time   CHOL 383 (A) 02/17/2021 0000   TRIG 373 (A) 02/17/2021 0000   HDL 61 02/17/2021 0000   LDLCALC 247 02/17/2021 0000     Assessment & Plan:   1) Uncontrolled type 2 diabetes mellitus with complication, with long-term current use of insulin (HCC)  - Patient has currently uncontrolled symptomatic type 2 DM since 71 years of age.  She presents today with her meter and logs showing greatly improved glycemic profile overall.  She was not due for another A1c today.  Her PCP did start her on [place] about 2 weeks ago.  She denies any hypoglycemia.  Analysis of her meter shows 7-day average of 151, 14-day average of 177, 30-day average of 200, and 90-day average of 210.   - Recent labs are reviewed.   Her diabetes is complicated by noncompliance/nonadherence, obesity and sedentary life and patient remains at a high risk for more acute and chronic complications of diabetes which include CAD, CVA, CKD, retinopathy, and neuropathy. These are all discussed in detail with the patient.  - Nutritional counseling repeated at each appointment due to patients tendency to fall back in to old habits.  - The patient admits there is a room for improvement in their diet and drink choices. -  Suggestion is made for the patient to avoid simple carbohydrates from their diet including Cakes, Sweet Desserts / Pastries, Ice Cream, Soda (diet and regular), Sweet Tea, Candies, Chips, Cookies, Sweet Pastries, Store Bought Juices, Alcohol in Excess of 1-2 drinks a day, Artificial Sweeteners, Coffee Creamer, and "Sugar-free" Products. This  will help patient to have stable blood glucose profile and potentially avoid unintended weight gain.   -  I encouraged the patient to switch to unprocessed or minimally processed complex starch and increased protein intake (animal or plant source), fruits, and vegetables.   - Patient is advised to stick to a routine mealtimes to eat 3 meals a day and avoid unnecessary snacks (to snack only to correct hypoglycemia).  - I have approached patient with the following individualized plan to manage diabetes and patient agrees:   -she is struggling to achieve control of diabetes, likely deals with moderate cognitive deficit. Avoiding hypoglycemia is the #1 priority in her case.   -She has done reasonably well with switching back to basal insulin.  She is advised to continue Tresiba 60 units SQ nightly and Glipizide 5 mg XL daily with breakfast.    I discontinued her Wilder Glade, risks outweigh benefits in her case.  -She does not tolerate Metformin due to GI side effects.  -She is encouraged to start consistently monitoring blood glucose twice daily, before breakfast and before bed, and to call the clinic if she has readings less than 70 or above 300 for 3 tests in a row.  -She is not a good candidate for incretin therapy due to her hypertriglyceridemia increasing her risk of pancreatitis.    2) Lipids/HPL:  Her most recent lipid panel from 02/17/21 shows uncontrolled LDL of 247 and improved triglycerides of 373.  She is advised to continue Pravastatin 40 mg po daily at bedtime and continue Fenofibrate 145 mg po daily.  3) Hypertension: -Her blood pressure is not controlled to target.  She is advised to continue Norvasc 7.5 mg po daily, Lisinopril-HCT 40-25 mg po daily and follow up with Dr. Manuella Ghazi regarding her meds.   She is advised to continue follow-up closely with Dr. Manuella Ghazi for primary care needs.      I spent 41 minutes in the care of the patient today including review of labs from [facility], Lipids,  Thyroid Function, Hematology (current and previous including abstractions from other facilities); face-to-face time discussing  her blood glucose readings/logs, discussing hypoglycemia and hyperglycemia episodes and symptoms, medications doses, her options of short and long term treatment based on the latest standards of care / guidelines;  discussion about incorporating lifestyle medicine;  and documenting the encounter.    Please refer to Patient Instructions for Blood Glucose Monitoring and Insulin/Medications Dosing Guide"  in media tab for additional information. Please  also refer to " Patient Self Inventory" in the Media  tab for reviewed elements of pertinent patient history.  Orlena Sheldon participated in the discussions, expressed understanding, and voiced agreement with the above plans.  All questions were answered to her satisfaction. she is encouraged to contact clinic should she have any questions or concerns prior to her return visit.   Follow up plan: Return in about 3 months (around 10/26/2021) for Diabetes F/U with A1c in office, Previsit labs, Bring meter and logs.   Rayetta Pigg, [facility] [address] [place], [place] [address] Phone: [phone] Fax: [phone]

## 2021-07-28 NOTE — Patient Instructions (Signed)

## 2021-09-01 DIAGNOSIS — Z789 Other specified health status: Secondary | ICD-10-CM | POA: Diagnosis not present

## 2021-09-01 DIAGNOSIS — E1165 Type 2 diabetes mellitus with hyperglycemia: Secondary | ICD-10-CM | POA: Diagnosis not present

## 2021-09-01 DIAGNOSIS — G4700 Insomnia, unspecified: Secondary | ICD-10-CM | POA: Diagnosis not present

## 2021-09-01 DIAGNOSIS — I10 Essential (primary) hypertension: Secondary | ICD-10-CM | POA: Diagnosis not present

## 2021-09-01 DIAGNOSIS — Z299 Encounter for prophylactic measures, unspecified: Secondary | ICD-10-CM | POA: Diagnosis not present

## 2021-09-01 DIAGNOSIS — R350 Frequency of micturition: Secondary | ICD-10-CM | POA: Diagnosis not present

## 2021-09-01 DIAGNOSIS — N390 Urinary tract infection, site not specified: Secondary | ICD-10-CM | POA: Diagnosis not present

## 2021-09-03 ENCOUNTER — Ambulatory Visit: Payer: Medicare Other | Admitting: Gastroenterology

## 2021-09-14 ENCOUNTER — Other Ambulatory Visit: Payer: Self-pay | Admitting: Nurse Practitioner

## 2021-09-30 DIAGNOSIS — E1165 Type 2 diabetes mellitus with hyperglycemia: Secondary | ICD-10-CM | POA: Diagnosis not present

## 2021-09-30 DIAGNOSIS — I10 Essential (primary) hypertension: Secondary | ICD-10-CM | POA: Diagnosis not present

## 2021-09-30 DIAGNOSIS — Z713 Dietary counseling and surveillance: Secondary | ICD-10-CM | POA: Diagnosis not present

## 2021-09-30 DIAGNOSIS — Z299 Encounter for prophylactic measures, unspecified: Secondary | ICD-10-CM | POA: Diagnosis not present

## 2021-10-19 DIAGNOSIS — H401112 Primary open-angle glaucoma, right eye, moderate stage: Secondary | ICD-10-CM | POA: Diagnosis not present

## 2021-10-21 DIAGNOSIS — E1100 Type 2 diabetes mellitus with hyperosmolarity without nonketotic hyperglycemic-hyperosmolar coma (NKHHC): Secondary | ICD-10-CM | POA: Diagnosis not present

## 2021-10-21 DIAGNOSIS — E1165 Type 2 diabetes mellitus with hyperglycemia: Secondary | ICD-10-CM | POA: Diagnosis not present

## 2021-10-25 DIAGNOSIS — J069 Acute upper respiratory infection, unspecified: Secondary | ICD-10-CM | POA: Diagnosis not present

## 2021-10-25 DIAGNOSIS — J449 Chronic obstructive pulmonary disease, unspecified: Secondary | ICD-10-CM | POA: Diagnosis not present

## 2021-10-25 DIAGNOSIS — Z87891 Personal history of nicotine dependence: Secondary | ICD-10-CM | POA: Diagnosis not present

## 2021-10-25 DIAGNOSIS — Z299 Encounter for prophylactic measures, unspecified: Secondary | ICD-10-CM | POA: Diagnosis not present

## 2021-10-26 ENCOUNTER — Ambulatory Visit (INDEPENDENT_AMBULATORY_CARE_PROVIDER_SITE_OTHER): Payer: Medicare Other | Admitting: Nurse Practitioner

## 2021-10-26 ENCOUNTER — Encounter: Payer: Self-pay | Admitting: Nurse Practitioner

## 2021-10-26 VITALS — BP 131/78 | HR 105 | Ht 62.0 in | Wt 182.6 lb

## 2021-10-26 DIAGNOSIS — I10 Essential (primary) hypertension: Secondary | ICD-10-CM | POA: Diagnosis not present

## 2021-10-26 DIAGNOSIS — E782 Mixed hyperlipidemia: Secondary | ICD-10-CM | POA: Diagnosis not present

## 2021-10-26 DIAGNOSIS — Z91199 Patient's noncompliance with other medical treatment and regimen due to unspecified reason: Secondary | ICD-10-CM

## 2021-10-26 DIAGNOSIS — E1165 Type 2 diabetes mellitus with hyperglycemia: Secondary | ICD-10-CM

## 2021-10-26 DIAGNOSIS — Z794 Long term (current) use of insulin: Secondary | ICD-10-CM

## 2021-10-26 DIAGNOSIS — E559 Vitamin D deficiency, unspecified: Secondary | ICD-10-CM

## 2021-10-26 LAB — POCT GLYCOSYLATED HEMOGLOBIN (HGB A1C): HbA1c, POC (controlled diabetic range): 7.2 % — AB (ref 0.0–7.0)

## 2021-10-26 NOTE — Patient Instructions (Signed)

## 2021-10-26 NOTE — Progress Notes (Signed)
10/26/2021 ? ?Endocrinology follow-up note ? ?Subjective:  ? ? Patient ID: Carrie Turner, female    DOB: 04-18-1951.  She is being seen in follow-up  for management of diabetes requested by Monico Blitz, MD ? ?Past Medical History:  ?Diagnosis Date  ? Anxiety   ? Arthritis   ? COPD (chronic obstructive pulmonary disease) ([place])   ? Delayed gastric emptying   ? GES completed on 08/27/2019 revealed borderline delayed gastric emptying; 87% emptying at 4 hours (normal greater than 90%)  ? Dementia ([place]) 09/30/2019  ? per patient, early dementia diagnosed after seeing PCP recently  ? Depression   ? Diabetes mellitus, type II ([place])   ? GERD (gastroesophageal reflux disease)   ? Glaucoma   ? Hyperlipidemia   ? Hypertension   ? Migraines   ? Neuropathy   ? Seizures ([place])   ? seizure was from ETOH, "a long time ago", no med and no recurrance  ? Sleep apnea   ? Vitamin D deficiency   ? ? ?Past Surgical History:  ?Procedure Laterality Date  ? ABDOMINAL HYSTERECTOMY    ? CATARACT EXTRACTION W/PHACO Left 07/24/2019  ? Procedure: CATARACT EXTRACTION PHACO AND INTRAOCULAR LENS PLACEMENT LEFT EYE  (CDE: 5.60);  Surgeon: Baruch Goldmann, MD;  Location: [facility];  Service: Ophthalmology;  Laterality: Left;  ? CATARACT EXTRACTION W/PHACO Right 08/19/2019  ? Procedure: CATARACT EXTRACTION PHACO AND INTRAOCULAR LENS PLACEMENT RIGHT EYE;  Surgeon: Baruch Goldmann, MD;  Location: [facility];  Service: Ophthalmology;  Laterality: Right;  CDE: 8.83  ? CHOLECYSTECTOMY N/A 12/25/2019  ? Procedure: LAPAROSCOPIC CHOLECYSTECTOMY;  Surgeon: Aviva Signs, MD;  Location: [facility];  Service: General;  Laterality: N/A;  ? COLONOSCOPY  11/2015  ? Dr. Britta Mccreedy: sessile polyp removed (benign). advised repeat colonoscopy in 5 years.   ? COLONOSCOPY WITH PROPOFOL N/A 10/26/2017  ? RMR: Tubular adenoma removed.  Random colon biopsies negative.  Nonbleeding internal hemorrhoids.  Next colonoscopy in 5 years.  ? ESOPHAGEAL DILATION  10/26/2017  ? Procedure: ESOPHAGEAL DILATION;   Surgeon: Daneil Dolin, MD;  Location: [facility];  Service: Endoscopy;;  ? ESOPHAGOGASTRODUODENOSCOPY  11/2015  ? Dr. Britta Mccreedy: hiatal hernia  ? ESOPHAGOGASTRODUODENOSCOPY (EGD) WITH PROPOFOL N/A 10/26/2017  ? RMR: Erosive reflux esophagitis, hiatal hernia.  Small bowel biopsies negative.  ? ESOPHAGOGASTRODUODENOSCOPY (EGD) WITH PROPOFOL N/A 04/15/2021  ? Procedure: ESOPHAGOGASTRODUODENOSCOPY (EGD) WITH PROPOFOL;  Surgeon: Daneil Dolin, MD;  [facility]: [facility];  Service: Endoscopy;  Laterality: N/A;  8:45am - LM to see if pt could move up CY  ? INSERTION OF ANTERIOR SEGMENT AQUEOUS DRAINAGE DEVICE (ISTENT) Left 07/24/2019  ? Procedure: INSERTION OF ANTERIOR SEGMENT AQUEOUS DRAINAGE DEVICE (ISTENT) LEFT EYE;  Surgeon: Baruch Goldmann, MD;  Location: [facility];  Service: Ophthalmology;  Laterality: Left;  ? INSERTION OF ANTERIOR SEGMENT AQUEOUS DRAINAGE DEVICE (ISTENT) Right 08/19/2019  ? Procedure: INSERTION OF ANTERIOR SEGMENT AQUEOUS DRAINAGE DEVICE (ISTENT) RIGHT EYE;  Surgeon: Baruch Goldmann, MD;  Location: [facility];  Service: Ophthalmology;  Laterality: Right;  ? KNEE ARTHROSCOPY Right   ? MALONEY DILATION N/A 04/15/2021  ? Procedure: MALONEY DILATION;  Surgeon: Daneil Dolin, MD;  [facility]: [facility];  Service: Endoscopy;  Laterality: N/A;  ? ORIF ANKLE FRACTURE Right   ?  ? ?Social History  ? ?Socioeconomic History  ? Marital status: Divorced  ?  Spouse name: Not on file  ? Number of children: Not on file  ? Years of education: Not on file  ?  Highest education level: Not on file  ?Occupational History  ? Not on file  ?Tobacco Use  ? Smoking status: Never  ? Smokeless tobacco: Never  ?Vaping Use  ? Vaping Use: Never used  ?Substance and Sexual Activity  ? Alcohol use: No  ? Drug use: No  ? Sexual activity: Never  ?Other Topics Concern  ? Not on file  ?Social History Narrative  ? Not on file  ? ?Social Determinants of Health  ? ?Financial Resource Strain: Not on file  ?Food Insecurity: Not on file   ?Transportation Needs: Not on file  ?Physical Activity: Not on file  ?Stress: Not on file  ?Social Connections: Not on file  ?Intimate Partner Violence: Not on file  ? ? ?Current Outpatient Medications on File Prior to Visit  ?Medication Sig Dispense Refill  ? albuterol (VENTOLIN HFA) 108 (90 Base) MCG/ACT inhaler Inhale 1-2 puffs into the lungs every 6 (six) hours as needed for wheezing or shortness of breath.    ? amLODipine (NORVASC) 2.5 MG tablet Take 2.5 mg by mouth daily.    ? amLODipine (NORVASC) 5 MG tablet Take 5 mg by mouth daily.     ? aspirin EC 81 MG tablet Take 81 mg by mouth daily.    ? azithromycin (ZITHROMAX) 250 MG tablet Take 250 mg by mouth as directed.    ? BELSOMRA 15 MG TABS Take 1 tablet by mouth at bedtime as needed.    ? Blood Glucose Monitoring Suppl (ONETOUCH VERIO) w/Device KIT 1 each by Does not apply route in the morning, at noon, in the evening, and at bedtime. 1 kit 0  ? Cholecalciferol (VITAMIN D3) 2000 units TABS Take 2,000 Units by mouth daily.    ? citalopram (CELEXA) 40 MG tablet Take 40 mg by mouth daily.    ? dexlansoprazole (DEXILANT) 60 MG capsule TAKE 1 CAPSULE BY MOUTH ONCE DAILY. 90 capsule 3  ? donepezil (ARICEPT) 10 MG tablet Take 10 mg by mouth daily.    ? eszopiclone (LUNESTA) 2 MG TABS tablet Take 2 mg by mouth at bedtime as needed for sleep. Take immediately before bedtime    ? fenofibrate (TRICOR) 145 MG tablet Take 1 tablet (145 mg total) by mouth daily. 90 tablet 3  ? gabapentin (NEURONTIN) 800 MG tablet Take 800 mg by mouth 3 (three) times daily.    ? glipiZIDE (GLUCOTROL XL) 5 MG 24 hr tablet Take 5 mg by mouth daily.    ? insulin degludec (TRESIBA FLEXTOUCH) 100 UNIT/ML FlexTouch Pen Inject 60 Units into the skin at bedtime. 30 mL 1  ? Insulin Pen Needle (GLOBAL EASE INJECT PEN NEEDLES) 31G X 5 MM MISC USE 3 TIMES A DAY OR AS DIRECTED. 100 each 2  ? Insulin Pen Needle (PEN NEEDLES) 31G X 6 MM MISC 1 each by Does not apply route 4 (four) times daily. 400 each  1  ? latanoprost (XALATAN) 0.005 % ophthalmic solution SMARTSIG:In Eye(s)    ? LINZESS 72 MCG capsule Take 72 mcg by mouth every morning. As needed    ? lisinopril-hydrochlorothiazide (PRINZIDE,ZESTORETIC) 20-12.5 MG tablet Take 2 tablets by mouth daily.     ? metoCLOPramide (REGLAN) 5 MG tablet Take 5 mg by mouth in the morning and at bedtime.    ? ondansetron (ZOFRAN) 8 MG tablet Take 1 tablet (8 mg total) by mouth every 8 (eight) hours as needed for nausea or vomiting. 30 tablet 0  ? ONETOUCH VERIO test strip USE 1 STRIP  TO CHECK GLUCOSE 4 TIMES DAILY. 200 strip 1  ? potassium chloride (KLOR-CON M) 10 MEQ tablet Take 10 mEq by mouth daily.    ? pravastatin (PRAVACHOL) 40 MG tablet Take 1 tablet (40 mg total) by mouth daily. 90 tablet 3  ? PREMARIN vaginal cream SMARTSIG:1 Vaginal Every Night    ? pyridOXINE (VITAMIN B-6) 100 MG tablet Take 100 mg by mouth daily.    ? rosuvastatin (CRESTOR) 10 MG tablet Take 10 mg by mouth at bedtime.    ? temazepam (RESTORIL) 30 MG capsule Take 30 mg by mouth at bedtime as needed.    ? traZODone (DESYREL) 50 MG tablet Take 50 mg by mouth at bedtime.    ? venlafaxine XR (EFFEXOR-XR) 150 MG 24 hr capsule Take 150 mg by mouth daily.    ? vitamin B-12 (CYANOCOBALAMIN) 500 MCG tablet Take 500 mcg by mouth daily.    ? ?No current facility-administered medications on file prior to visit.  ? ? ?No facility-administered encounter medications on file as of 10/16/2018.   ? ?Allergies  ?Allergen Reactions  ? Sulfa Antibiotics Other (See Comments)  ?  Weakness and fatigued   ? ? ? ?Diabetes ?She presents for her follow-up diabetic visit. She has type 2 diabetes mellitus. Onset time: diagnosed at approximate age of 12. Her disease course has been improving. There are no hypoglycemic associated symptoms. Pertinent negatives for hypoglycemia include no confusion, headaches, mood changes or seizures. Associated symptoms include blurred vision, fatigue and foot paresthesias. Pertinent negatives for  diabetes include no chest pain, no polydipsia, no polyphagia, no polyuria and no weight loss. There are no hypoglycemic complications. Symptoms are improving. Diabetic complications include peripheral neur

## 2021-11-11 ENCOUNTER — Encounter: Payer: Self-pay | Admitting: Internal Medicine

## 2021-12-06 ENCOUNTER — Ambulatory Visit: Payer: Medicare Other | Admitting: Gastroenterology

## 2021-12-07 ENCOUNTER — Other Ambulatory Visit: Payer: Self-pay | Admitting: Gastroenterology

## 2021-12-28 DIAGNOSIS — I10 Essential (primary) hypertension: Secondary | ICD-10-CM | POA: Diagnosis not present

## 2021-12-28 DIAGNOSIS — G4700 Insomnia, unspecified: Secondary | ICD-10-CM | POA: Diagnosis not present

## 2021-12-28 DIAGNOSIS — Z713 Dietary counseling and surveillance: Secondary | ICD-10-CM | POA: Diagnosis not present

## 2021-12-28 DIAGNOSIS — Z299 Encounter for prophylactic measures, unspecified: Secondary | ICD-10-CM | POA: Diagnosis not present

## 2022-01-02 ENCOUNTER — Other Ambulatory Visit: Payer: Self-pay | Admitting: "Endocrinology

## 2022-01-06 DIAGNOSIS — Z789 Other specified health status: Secondary | ICD-10-CM | POA: Diagnosis not present

## 2022-01-06 DIAGNOSIS — Z299 Encounter for prophylactic measures, unspecified: Secondary | ICD-10-CM | POA: Diagnosis not present

## 2022-01-06 DIAGNOSIS — Z0000 Encounter for general adult medical examination without abnormal findings: Secondary | ICD-10-CM | POA: Diagnosis not present

## 2022-01-06 DIAGNOSIS — G301 Alzheimer's disease with late onset: Secondary | ICD-10-CM | POA: Diagnosis not present

## 2022-01-06 DIAGNOSIS — I10 Essential (primary) hypertension: Secondary | ICD-10-CM | POA: Diagnosis not present

## 2022-01-06 DIAGNOSIS — E1165 Type 2 diabetes mellitus with hyperglycemia: Secondary | ICD-10-CM | POA: Diagnosis not present

## 2022-01-20 DIAGNOSIS — E1165 Type 2 diabetes mellitus with hyperglycemia: Secondary | ICD-10-CM | POA: Diagnosis not present

## 2022-01-20 DIAGNOSIS — E1100 Type 2 diabetes mellitus with hyperosmolarity without nonketotic hyperglycemic-hyperosmolar coma (NKHHC): Secondary | ICD-10-CM | POA: Diagnosis not present

## 2022-01-27 ENCOUNTER — Ambulatory Visit (INDEPENDENT_AMBULATORY_CARE_PROVIDER_SITE_OTHER): Payer: Medicare Other | Admitting: Nurse Practitioner

## 2022-01-27 ENCOUNTER — Encounter: Payer: Self-pay | Admitting: Nurse Practitioner

## 2022-01-27 VITALS — BP 154/87 | HR 93 | Ht 62.0 in | Wt 184.0 lb

## 2022-01-27 DIAGNOSIS — I10 Essential (primary) hypertension: Secondary | ICD-10-CM | POA: Diagnosis not present

## 2022-01-27 DIAGNOSIS — Z91199 Patient's noncompliance with other medical treatment and regimen due to unspecified reason: Secondary | ICD-10-CM

## 2022-01-27 DIAGNOSIS — E559 Vitamin D deficiency, unspecified: Secondary | ICD-10-CM

## 2022-01-27 DIAGNOSIS — E1165 Type 2 diabetes mellitus with hyperglycemia: Secondary | ICD-10-CM | POA: Diagnosis not present

## 2022-01-27 DIAGNOSIS — Z794 Long term (current) use of insulin: Secondary | ICD-10-CM | POA: Diagnosis not present

## 2022-01-27 DIAGNOSIS — E782 Mixed hyperlipidemia: Secondary | ICD-10-CM | POA: Diagnosis not present

## 2022-01-27 LAB — POCT GLYCOSYLATED HEMOGLOBIN (HGB A1C): HbA1c POC (<> result, manual entry): 7.9 % (ref 4.0–5.6)

## 2022-01-27 MED ORDER — OZEMPIC (0.25 OR 0.5 MG/DOSE) 2 MG/1.5ML ~~LOC~~ SOPN: 0.5000 mg | PEN_INJECTOR | SUBCUTANEOUS | 3 refills | Status: DC

## 2022-01-27 NOTE — Progress Notes (Signed)
01/27/2022  Endocrinology follow-up note  Subjective:    Patient ID: INSIYA OSHEA, female    DOB: [birth year].  She is being seen in follow-up  for management of diabetes requested by Kirstie Peri, MD  Past Medical History:  Diagnosis Date   Anxiety    Arthritis    COPD (chronic obstructive pulmonary disease) (HCC)    Delayed gastric emptying    GES completed on 08/27/2019 revealed borderline delayed gastric emptying; 87% emptying at 4 hours (normal greater than 90%)   Dementia (HCC) 09/30/2019   per patient, early dementia diagnosed after seeing PCP recently   Depression    Diabetes mellitus, type II (HCC)    GERD (gastroesophageal reflux disease)    Glaucoma    Hyperlipidemia    Hypertension    Migraines    Neuropathy    Seizures (HCC)    seizure was from ETOH, "a long time ago", no med and no recurrance   Sleep apnea    Vitamin D deficiency     Past Surgical History:  Procedure Laterality Date   ABDOMINAL HYSTERECTOMY     CATARACT EXTRACTION W/PHACO Left 07/24/2019   Procedure: CATARACT EXTRACTION PHACO AND INTRAOCULAR LENS PLACEMENT LEFT EYE  (CDE: 5.60);  Surgeon: Fabio Pierce, MD;  Location: [facility];  Service: Ophthalmology;  Laterality: Left;   CATARACT EXTRACTION W/PHACO Right 08/19/2019   Procedure: CATARACT EXTRACTION PHACO AND INTRAOCULAR LENS PLACEMENT RIGHT EYE;  Surgeon: Fabio Pierce, MD;  Location: [facility];  Service: Ophthalmology;  Laterality: Right;  CDE: 8.83   CHOLECYSTECTOMY N/A 12/25/2019   Procedure: LAPAROSCOPIC CHOLECYSTECTOMY;  Surgeon: Franky Macho, MD;  Location: [facility];  Service: General;  Laterality: N/A;   COLONOSCOPY  11/2015   Dr. Teena Dunk: sessile polyp removed (benign). advised repeat colonoscopy in 5 years.    COLONOSCOPY WITH PROPOFOL N/A 10/26/2017   RMR: Tubular adenoma removed.  Random colon biopsies negative.  Nonbleeding internal hemorrhoids.  Next colonoscopy in 5 years.   ESOPHAGEAL DILATION  10/26/2017   Procedure: ESOPHAGEAL DILATION;   Surgeon: Corbin Ade, MD;  Location: [facility];  Service: Endoscopy;;   ESOPHAGOGASTRODUODENOSCOPY  11/2015   Dr. Teena Dunk: hiatal hernia   ESOPHAGOGASTRODUODENOSCOPY (EGD) WITH PROPOFOL N/A 10/26/2017   RMR: Erosive reflux esophagitis, hiatal hernia.  Small bowel biopsies negative.   ESOPHAGOGASTRODUODENOSCOPY (EGD) WITH PROPOFOL N/A 04/15/2021   Procedure: ESOPHAGOGASTRODUODENOSCOPY (EGD) WITH PROPOFOL;  Surgeon: Corbin Ade, MD;  Location: [facility];  Service: Endoscopy;  Laterality: N/A;  8:45am - LM to see if pt could move up CY   INSERTION OF ANTERIOR SEGMENT AQUEOUS DRAINAGE DEVICE (ISTENT) Left 07/24/2019   Procedure: INSERTION OF ANTERIOR SEGMENT AQUEOUS DRAINAGE DEVICE (ISTENT) LEFT EYE;  Surgeon: Fabio Pierce, MD;  Location: [facility];  Service: Ophthalmology;  Laterality: Left;   INSERTION OF ANTERIOR SEGMENT AQUEOUS DRAINAGE DEVICE (ISTENT) Right 08/19/2019   Procedure: INSERTION OF ANTERIOR SEGMENT AQUEOUS DRAINAGE DEVICE (ISTENT) RIGHT EYE;  Surgeon: Fabio Pierce, MD;  Location: [facility];  Service: Ophthalmology;  Laterality: Right;   KNEE ARTHROSCOPY Right    MALONEY DILATION N/A 04/15/2021   Procedure: Elease Hashimoto DILATION;  Surgeon: Corbin Ade, MD;  Location: [facility];  Service: Endoscopy;  Laterality: N/A;   ORIF ANKLE FRACTURE Right      Social History   Socioeconomic History   Marital status: Divorced    Spouse name: Not on file   Number of children: Not on file   Years of education: Not on file  Highest education level: Not on file  Occupational History   Not on file  Tobacco Use   Smoking status: Never   Smokeless tobacco: Never  Vaping Use   Vaping Use: Never used  Substance and Sexual Activity   Alcohol use: No   Drug use: No   Sexual activity: Never  Other Topics Concern   Not on file  Social History Narrative   Not on file   Social Determinants of Health   Financial Resource Strain: Not on file  Food Insecurity: Not on file   Transportation Needs: Not on file  Physical Activity: Not on file  Stress: Not on file  Social Connections: Not on file  Intimate Partner Violence: Not on file    Current Outpatient Medications on File Prior to Visit  Medication Sig Dispense Refill   albuterol (VENTOLIN HFA) 108 (90 Base) MCG/ACT inhaler Inhale 1-2 puffs into the lungs every 6 (six) hours as needed for wheezing or shortness of breath.     amLODipine (NORVASC) 5 MG tablet Take 5 mg by mouth daily.      aspirin EC 81 MG tablet Take 81 mg by mouth daily.     azithromycin (ZITHROMAX) 250 MG tablet Take 250 mg by mouth as directed.     BELSOMRA 15 MG TABS Take 1 tablet by mouth at bedtime as needed.     Blood Glucose Monitoring Suppl (ONETOUCH VERIO) w/Device KIT 1 each by Does not apply route in the morning, at noon, in the evening, and at bedtime. 1 kit 0   Cholecalciferol (VITAMIN D3) 2000 units TABS Take 2,000 Units by mouth daily.     dexlansoprazole (DEXILANT) 60 MG capsule TAKE 1 CAPSULE BY MOUTH ONCE DAILY. 28 capsule 5   donepezil (ARICEPT) 10 MG tablet Take 10 mg by mouth daily.     fenofibrate (TRICOR) 145 MG tablet Take 1 tablet (145 mg total) by mouth daily. 90 tablet 3   gabapentin (NEURONTIN) 800 MG tablet Take 800 mg by mouth 3 (three) times daily.     Insulin Pen Needle (GLOBAL EASE INJECT PEN NEEDLES) 31G X 5 MM MISC USE 3 TIMES A DAY OR AS DIRECTED. 100 each 2   Insulin Pen Needle (PEN NEEDLES) 31G X 6 MM MISC 1 each by Does not apply route 4 (four) times daily. 400 each 1   latanoprost (XALATAN) 0.005 % ophthalmic solution SMARTSIG:In Eye(s)     LINZESS 72 MCG capsule Take 72 mcg by mouth every morning. As needed     lisinopril-hydrochlorothiazide (PRINZIDE,ZESTORETIC) 20-12.5 MG tablet Take 2 tablets by mouth daily.      LORazepam (ATIVAN) 0.5 MG tablet Take 0.5 mg by mouth daily as needed.     metoCLOPramide (REGLAN) 5 MG tablet Take 5 mg by mouth in the morning and at bedtime.     ondansetron (ZOFRAN) 8  MG tablet Take 1 tablet (8 mg total) by mouth every 8 (eight) hours as needed for nausea or vomiting. 30 tablet 0   ONETOUCH VERIO test strip USE 1 STRIP TO CHECK GLUCOSE 4 TIMES DAILY. 200 strip 1   potassium chloride (KLOR-CON M) 10 MEQ tablet Take 10 mEq by mouth daily.     pravastatin (PRAVACHOL) 40 MG tablet Take 1 tablet (40 mg total) by mouth daily. 90 tablet 3   PREMARIN vaginal cream SMARTSIG:1 Vaginal Every Night     pyridOXINE (VITAMIN B-6) 100 MG tablet Take 100 mg by mouth daily.     rosuvastatin (CRESTOR) 10  MG tablet Take 10 mg by mouth at bedtime.     temazepam (RESTORIL) 30 MG capsule Take 30 mg by mouth at bedtime as needed.     traZODone (DESYREL) 50 MG tablet Take 50 mg by mouth at bedtime.     TRESIBA FLEXTOUCH 100 UNIT/ML FlexTouch Pen INJECT 60 UNITS INTO THE SKIN AT BEDTIME. 15 mL 2   venlafaxine XR (EFFEXOR-XR) 150 MG 24 hr capsule Take 150 mg by mouth daily.     vitamin B-12 (CYANOCOBALAMIN) 500 MCG tablet Take 500 mcg by mouth daily.     No current facility-administered medications on file prior to visit.    No facility-administered encounter medications on file as of 10/16/2018.    Allergies  Allergen Reactions   Sulfa Antibiotics Other (See Comments)    Weakness and fatigued      Diabetes She presents for her follow-up diabetic visit. She has type 2 diabetes mellitus. Onset time: diagnosed at approximate age of 89. Her disease course has been fluctuating. There are no hypoglycemic associated symptoms. Pertinent negatives for hypoglycemia include no confusion, headaches, mood changes or seizures. Associated symptoms include blurred vision, fatigue and foot paresthesias. Pertinent negatives for diabetes include no chest pain, no polydipsia, no polyphagia, no polyuria and no weight loss. There are no hypoglycemic complications. Symptoms are stable. Diabetic complications include peripheral neuropathy. Risk factors for coronary artery disease include diabetes  mellitus, dyslipidemia, hypertension, obesity, post-menopausal and sedentary lifestyle. Current diabetic treatment includes oral agent (monotherapy) and insulin injections. She is compliant with treatment most of the time. Her weight is increasing steadily. She is following a generally unhealthy diet. When asked about meal planning, she reported none. She has had a previous visit with a dietitian. She rarely participates in exercise. Her home blood glucose trend is fluctuating dramatically. (She presents today with her meter and logs showing fluctuating glycemic profile overall.  Her POCT A1c today is 7.9%, increasing from last visit of 7.2%.  She denies significant hypoglycemia.  She does admit to eating some sweets at times.) An ACE inhibitor/angiotensin II receptor blocker is being taken. She does not see a podiatrist.Eye exam is current.  Hyperlipidemia This is a chronic problem. The current episode started more than 1 year ago. The problem is resistant. Recent lipid tests were reviewed and are high. Exacerbating diseases include diabetes and obesity. Factors aggravating her hyperlipidemia include thiazides. Pertinent negatives include no chest pain, leg pain, myalgias or shortness of breath. Current antihyperlipidemic treatment includes fibric acid derivatives and statins. The current treatment provides mild improvement of lipids. Compliance problems include adherence to exercise, adherence to diet and psychosocial issues.  Risk factors for coronary artery disease include diabetes mellitus, dyslipidemia, hypertension, obesity, post-menopausal and a sedentary lifestyle.  Hypertension This is a chronic problem. The current episode started more than 1 year ago. The problem has been waxing and waning since onset. The problem is uncontrolled. Associated symptoms include blurred vision. Pertinent negatives include no chest pain, headaches or shortness of breath. There are no associated agents to hypertension.  Risk factors for coronary artery disease include diabetes mellitus, sedentary lifestyle, obesity, dyslipidemia and post-menopausal state. Past treatments include calcium channel blockers, ACE inhibitors and diuretics. The current treatment provides mild improvement. Compliance problems include diet, exercise and psychosocial issues.      Review of systems  Constitutional: + steadily increasing body weight,  current Body mass index is 33.65 kg/m. , no fatigue, no subjective hyperthermia, no subjective hypothermia Eyes: no blurry vision, no xerophthalmia  ENT: no sore throat, no nodules palpated in throat, no dysphagia/odynophagia, no hoarseness Cardiovascular: no chest pain, no shortness of breath, no palpitations, no leg swelling Respiratory: no cough, no shortness of breath Gastrointestinal: no nausea/vomiting/diarrhea Musculoskeletal: no muscle/joint aches Skin: no rashes, no hyperemia Neurological: no tremors, no numbness, no tingling, no dizziness Psychiatric: no depression, no anxiety   Objective:    BP (!) 154/87   Pulse 93   Ht $R'5\' 2"'Bl$  (1.575 m)   Wt 184 lb (83.5 kg)   BMI 33.65 kg/m   Wt Readings from Last 3 Encounters:  01/27/22 184 lb (83.5 kg)  10/26/21 182 lb 9.6 oz (82.8 kg)  07/28/21 176 lb 9.6 oz (80.1 kg)   BP Readings from Last 3 Encounters:  01/27/22 (!) 154/87  10/26/21 131/78  07/28/21 (!) 143/89    Physical Exam- Limited  Constitutional:  Body mass index is 33.65 kg/m. , not in acute distress, normal state of mind Eyes:  EOMI, no exophthalmos Neck: Supple Cardiovascular: RRR, no murmurs, rubs, or gallops, no edema Respiratory: Adequate breathing efforts, no crackles, rales, rhonchi, or wheezing Musculoskeletal: no gross deformities, strength intact in all four extremities, no gross restriction of joint movements Skin:  no rashes, no hyperemia Neurological: no tremor with outstretched hands    Diabetic Foot Exam - Simple   No data filed      Lipid Panel     Component Value Date/Time   CHOL 383 (A) 02/17/2021 0000   TRIG 373 (A) 02/17/2021 0000   HDL 61 02/17/2021 0000   LDLCALC 247 02/17/2021 0000     Assessment & Plan:   1) Uncontrolled type 2 diabetes mellitus with complication, with long-term current use of insulin (HCC)  - Patient has currently uncontrolled symptomatic type 2 DM since 71 years of age.  She presents today with her meter and logs showing fluctuating glycemic profile overall.  Her POCT A1c today is 7.9%, increasing from last visit of 7.2%.  She denies significant hypoglycemia.  She does admit to eating some sweets at times.   - Recent labs are reviewed.   Her diabetes is complicated by noncompliance/nonadherence, obesity and sedentary life and patient remains at a high risk for more acute and chronic complications of diabetes which include CAD, CVA, CKD, retinopathy, and neuropathy. These are all discussed in detail with the patient.  - Nutritional counseling repeated at each appointment due to patients tendency to fall back in to old habits.  - The patient admits there is a room for improvement in their diet and drink choices. -  Suggestion is made for the patient to avoid simple carbohydrates from their diet including Cakes, Sweet Desserts / Pastries, Ice Cream, Soda (diet and regular), Sweet Tea, Candies, Chips, Cookies, Sweet Pastries, Store Bought Juices, Alcohol in Excess of 1-2 drinks a day, Artificial Sweeteners, Coffee Creamer, and "Sugar-free" Products. This will help patient to have stable blood glucose profile and potentially avoid unintended weight gain.   - I encouraged the patient to switch to unprocessed or minimally processed complex starch and increased protein intake (animal or plant source), fruits, and vegetables.   - Patient is advised to stick to a routine mealtimes to eat 3 meals a day and avoid unnecessary snacks (to snack only to correct hypoglycemia).  - I have approached  patient with the following individualized plan to manage diabetes and patient agrees:   -she is struggling to achieve control of diabetes, likely deals with moderate cognitive deficit. Avoiding hypoglycemia is the #  1 priority in her case.   -She has done reasonably well with switching back to basal insulin.  She is advised to continue Tresiba 60 units SQ nightly.  Will take her off Glipizide and start Ozempic 0.25 mg SQ weekly x 2 weeks, then increase to 0.5 mg SQ weekly thereafter to aide in attempts to lose weight and control her sugar cravings.   I discontinued her Wilder Glade, risks outweigh benefits in her case- given her account of yeast infections.  -She does not tolerate Metformin due to GI side effects.  -She is encouraged to start consistently monitoring blood glucose twice daily, before breakfast and before bed, and to call the clinic if she has readings less than 70 or above 300 for 3 tests in a row.  -She is not a good candidate for incretin therapy due to her hypertriglyceridemia increasing her risk of pancreatitis.    2) Lipids/HPL:  Her most recent lipid panel from 01/20/22 shows uncontrolled LDL of 137 and improved triglycerides of 216.  She is advised to continue Pravastatin 40 mg po daily at bedtime and continue Fenofibrate 145 mg po daily.    3) Hypertension: -Her blood pressure is not controlled to target today.  She is advised to continue Norvasc 7.5 mg po daily, Lisinopril-HCT 40-25 mg po daily and follow up with Dr. Manuella Ghazi regarding her meds.   She is advised to continue follow-up closely with Dr. Manuella Ghazi for primary care needs.     I spent 43 minutes in the care of the patient today including review of labs from [place], Lipids, Thyroid Function, Hematology (current and previous including abstractions from other facilities); face-to-face time discussing  her blood glucose readings/logs, discussing hypoglycemia and hyperglycemia episodes and symptoms, medications doses, her  options of short and long term treatment based on the latest standards of care / guidelines;  discussion about incorporating lifestyle medicine;  and documenting the encounter. Risk reduction counseling performed per USPSTF guidelines to reduce obesity and cardiovascular risk factors.     Please refer to Patient Instructions for Blood Glucose Monitoring and Insulin/Medications Dosing Guide"  in media tab for additional information. Please  also refer to " Patient Self Inventory" in the Media  tab for reviewed elements of pertinent patient history.  Orlena Sheldon participated in the discussions, expressed understanding, and voiced agreement with the above plans.  All questions were answered to her satisfaction. she is encouraged to contact clinic should she have any questions or concerns prior to her return visit.   Follow up plan: Return in about 3 months (around 04/29/2022) for Diabetes F/U with A1c in office, No previsit labs, Bring meter and logs.   Rayetta Pigg, [facility] [address] [place], [place] [address] Phone: [phone] Fax: [phone]

## 2022-02-03 ENCOUNTER — Other Ambulatory Visit: Payer: Self-pay | Admitting: Internal Medicine

## 2022-02-03 DIAGNOSIS — Z1231 Encounter for screening mammogram for malignant neoplasm of breast: Secondary | ICD-10-CM

## 2022-02-08 NOTE — Progress Notes (Deleted)
Referring Provider: Monico Blitz, MD Primary Care Physician:  Monico Blitz, MD Primary GI Physician: Dr. Gala Romney  No chief complaint on file.   HPI:   Carrie Turner is a 71 y.o. female with history of chronic alk phos elevation with negative work-up in 2019, fatty liver on [place] in 2021, GERD, nausea, chronic epigastric pain, constipation, adenomatous colon polyps, prior random colon biopsies negative and small bowel biopsies negative for celiac.  History of poorly controlled diabetes which has been felt to be contributing to nausea.  GES February 2021 with borderline delayed gastric emptying.  RUQ ultrasound March 2021 without gallstones or wall thickening, CBD slightly prominent at 6 mm.  HIDA within normal limits in May 2021.  Ultimately underwent cholecystectomy in June 2021 per her request.     Presenting today for follow-up of GERD, nausea/vomiting, epigastric pain, dysphagia.  Last seen in our office 02/24/2021.  She continued to wake up with morning nausea, but no vomiting.  She was taking Reglan in the morning which resolved her nausea and had no return throughout the day.  She was also taking Reglan before dinner.  Primarily eating 1 meal a day, dinner, which typically consisted of either cheeseburger, hotdog, or TV dinners.  Also reported sweets were her weakness.  Typical reflux symptoms well controlled on Dexilant 60 mg daily.  Blood sugars continue to be uncontrolled and had an upcoming appointment with Dr. Dorris Fetch. Had noticed solid foods getting stuck in her mid to lower chest x1 month.  Reported she had loose stools for couple months after she saw me in December 2021, but bowels returned to normal over the last few weeks, using Linzess as needed.  Occasional upper abdominal pain prior to a bowel movement that improves thereafter.  Continued with chronic intermittent toilet tissue hematochezia once every few months.  She was counseled extensively on gastroparesis diet, importance of good  glycemic control.  Plan to continue current medications and arrange EGD with possible dilation.  EGD 04/15/2021 with normal esophagus s/p dilation, medium size hiatal hernia, otherwise normal exam.  Recommended taking Dexilant 30 minutes before lunch rather than 30 minutes for breakfast to combat late night/early morning exacerbations of reflux.  Today:  GERD:   Nausea/vomiting:    Epigastric pain:    Constipation/diarrhea:    Elevated alk phos:  Chronic. Most recently alk phos fairly stable/within baseline fluctuations at 163 on 01/20/22.  Work-up in 2019 with GGT normal, hepatitis B&C negative, ANA, AMA, ASMA, immunoglobulins within normal limits, celiac screen negative. Ultrasound in 2021 with fatty liver.  Last A1C 7.9 on 7/6.   Weight:  Itching:  Fhx:    If continued increase in alk phos, consider re-evaluating with GGT and AMA.   Past Medical History:  Diagnosis Date   Anxiety    Arthritis    COPD (chronic obstructive pulmonary disease) ([place])    Delayed gastric emptying    GES completed on 08/27/2019 revealed borderline delayed gastric emptying; 87% emptying at 4 hours (normal greater than 90%)   Dementia ([place]) 09/30/2019   per patient, early dementia diagnosed after seeing PCP recently   Depression    Diabetes mellitus, type II ([place])    GERD (gastroesophageal reflux disease)    Glaucoma    Hyperlipidemia    Hypertension    Migraines    Neuropathy    Seizures ([place])    seizure was from ETOH, "a long time ago", no med and no recurrance   Sleep apnea  Vitamin D deficiency     Past Surgical History:  Procedure Laterality Date   ABDOMINAL HYSTERECTOMY     CATARACT EXTRACTION W/PHACO Left 07/24/2019   Procedure: CATARACT EXTRACTION PHACO AND INTRAOCULAR LENS PLACEMENT LEFT EYE  (CDE: 5.60);  Surgeon: Baruch Goldmann, MD;  Location: [facility];  Service: Ophthalmology;  Laterality: Left;   CATARACT EXTRACTION W/PHACO Right 08/19/2019   Procedure: CATARACT EXTRACTION  PHACO AND INTRAOCULAR LENS PLACEMENT RIGHT EYE;  Surgeon: Baruch Goldmann, MD;  Location: [facility];  Service: Ophthalmology;  Laterality: Right;  CDE: 8.83   CHOLECYSTECTOMY N/A 12/25/2019   Procedure: LAPAROSCOPIC CHOLECYSTECTOMY;  Surgeon: Aviva Signs, MD;  Location: [facility];  Service: General;  Laterality: N/A;   COLONOSCOPY  11/2015   Dr. Britta Mccreedy: sessile polyp removed (benign). advised repeat colonoscopy in 5 years.    COLONOSCOPY WITH PROPOFOL N/A 10/26/2017   RMR: Tubular adenoma removed.  Random colon biopsies negative.  Nonbleeding internal hemorrhoids.  Next colonoscopy in 5 years.   ESOPHAGEAL DILATION  10/26/2017   Procedure: ESOPHAGEAL DILATION;  Surgeon: Daneil Dolin, MD;  [facility]: [facility];  Service: Endoscopy;;   ESOPHAGOGASTRODUODENOSCOPY  11/2015   Dr. Britta Mccreedy: hiatal hernia   ESOPHAGOGASTRODUODENOSCOPY (EGD) WITH PROPOFOL N/A 10/26/2017   RMR: Erosive reflux esophagitis, hiatal hernia.  Small bowel biopsies negative.   ESOPHAGOGASTRODUODENOSCOPY (EGD) WITH PROPOFOL N/A 04/15/2021   Procedure: ESOPHAGOGASTRODUODENOSCOPY (EGD) WITH PROPOFOL;  Surgeon: Daneil Dolin, MD;  [facility];  Service: Endoscopy;  Laterality: N/A;  8:45am - LM to see if pt could move up [place] (ISTENT) Left 07/24/2019   Procedure: INSERTION OF ANTERIOR SEGMENT AQUEOUS DRAINAGE DEVICE (ISTENT) LEFT EYE;  Surgeon: Baruch Goldmann, MD;  Location: [facility];  Service: Ophthalmology;  Laterality: Left;   INSERTION OF ANTERIOR SEGMENT AQUEOUS DRAINAGE DEVICE (ISTENT) Right 08/19/2019   Procedure: INSERTION OF ANTERIOR SEGMENT AQUEOUS DRAINAGE DEVICE (ISTENT) RIGHT EYE;  Surgeon: Baruch Goldmann, MD;  Location: [facility];  Service: Ophthalmology;  Laterality: Right;   KNEE ARTHROSCOPY Right    MALONEY DILATION N/A 04/15/2021   Procedure: Venia Minks DILATION;  Surgeon: Daneil Dolin, MD;  [facility]: [facility];  Service: Endoscopy;  Laterality: N/A;   ORIF  ANKLE FRACTURE Right     Current Outpatient Medications  Medication Sig Dispense Refill   albuterol (VENTOLIN HFA) 108 (90 Base) MCG/ACT inhaler Inhale 1-2 puffs into the lungs every 6 (six) hours as needed for wheezing or shortness of breath.     amLODipine (NORVASC) 5 MG tablet Take 5 mg by mouth daily.      aspirin EC 81 MG tablet Take 81 mg by mouth daily.     azithromycin (ZITHROMAX) 250 MG tablet Take 250 mg by mouth as directed.     BELSOMRA 15 MG TABS Take 1 tablet by mouth at bedtime as needed.     Blood Glucose Monitoring Suppl (ONETOUCH VERIO) w/Device KIT 1 each by Does not apply route in the morning, at noon, in the evening, and at bedtime. 1 kit 0   Cholecalciferol (VITAMIN D3) 2000 units TABS Take 2,000 Units by mouth daily.     dexlansoprazole (DEXILANT) 60 MG capsule TAKE 1 CAPSULE BY MOUTH ONCE DAILY. 28 capsule 5   donepezil (ARICEPT) 10 MG tablet Take 10 mg by mouth daily.     fenofibrate (TRICOR) 145 MG tablet Take 1 tablet (145 mg total) by mouth daily. 90 tablet 3   gabapentin (NEURONTIN) 800 MG  tablet Take 800 mg by mouth 3 (three) times daily.     Insulin Pen Needle (GLOBAL EASE INJECT PEN NEEDLES) 31G X 5 MM MISC USE 3 TIMES A DAY OR AS DIRECTED. 100 each 2   Insulin Pen Needle (PEN NEEDLES) 31G X 6 MM MISC 1 each by Does not apply route 4 (four) times daily. 400 each 1   latanoprost (XALATAN) 0.005 % ophthalmic solution SMARTSIG:In Eye(s)     LINZESS 72 MCG capsule Take 72 mcg by mouth every morning. As needed     lisinopril-hydrochlorothiazide (PRINZIDE,ZESTORETIC) 20-12.5 MG tablet Take 2 tablets by mouth daily.      LORazepam (ATIVAN) 0.5 MG tablet Take 0.5 mg by mouth daily as needed.     metoCLOPramide (REGLAN) 5 MG tablet Take 5 mg by mouth in the morning and at bedtime.     ondansetron (ZOFRAN) 8 MG tablet Take 1 tablet (8 mg total) by mouth every 8 (eight) hours as needed for nausea or vomiting. 30 tablet 0   ONETOUCH VERIO test strip USE 1 STRIP TO CHECK  GLUCOSE 4 TIMES DAILY. 200 strip 1   potassium chloride (KLOR-CON M) 10 MEQ tablet Take 10 mEq by mouth daily.     pravastatin (PRAVACHOL) 40 MG tablet Take 1 tablet (40 mg total) by mouth daily. 90 tablet 3   PREMARIN vaginal cream SMARTSIG:1 Vaginal Every Night     pyridOXINE (VITAMIN B-6) 100 MG tablet Take 100 mg by mouth daily.     rosuvastatin (CRESTOR) 10 MG tablet Take 10 mg by mouth at bedtime.     Semaglutide,0.25 or 0.5MG/DOS, (OZEMPIC, 0.25 OR 0.5 MG/DOSE,) 2 MG/1.5ML SOPN Inject 0.5 mg into the skin once a week. 0.25 mg weekly x 2 weeks, then increase to 0.5 mg weekly thereafter 3 mL 3   temazepam (RESTORIL) 30 MG capsule Take 30 mg by mouth at bedtime as needed.     traZODone (DESYREL) 50 MG tablet Take 50 mg by mouth at bedtime.     TRESIBA FLEXTOUCH 100 UNIT/ML FlexTouch Pen INJECT 60 UNITS INTO THE SKIN AT BEDTIME. 15 mL 2   venlafaxine XR (EFFEXOR-XR) 150 MG 24 hr capsule Take 150 mg by mouth daily.     vitamin B-12 (CYANOCOBALAMIN) 500 MCG tablet Take 500 mcg by mouth daily.     No current facility-administered medications for this visit.    Allergies as of 02/10/2022 - Review Complete 01/27/2022  Allergen Reaction Noted   Sulfa antibiotics Other (See Comments) 03/04/2016    Family History  Problem Relation Age of Onset   Hypertension Mother    Colon cancer Mother        diagnosed early 64s   Cerebral aneurysm Mother    Alcohol abuse Father    Pancreatic cancer Sister        deceased in 73s   Colon cancer Sister        diagnosed 43 y/o   Stroke Brother    Cancer Maternal Grandmother    Cancer Maternal Grandfather    Cancer Paternal Grandmother    Other Paternal Grandfather    Other Brother     Social History   Socioeconomic History   Marital status: Divorced    Spouse name: Not on file   Number of children: Not on file   Years of education: Not on file   Highest education level: Not on file  Occupational History   Not on file  Tobacco Use    Smoking status: Never  Smokeless tobacco: Never  Vaping Use   Vaping Use: Never used  Substance and Sexual Activity   Alcohol use: No   Drug use: No   Sexual activity: Never  Other Topics Concern   Not on file  Social History Narrative   Not on file   Social Determinants of Health   Financial Resource Strain: Not on file  Food Insecurity: Not on file  Transportation Needs: Not on file  Physical Activity: Not on file  Stress: Not on file  Social Connections: Not on file    Review of Systems: Gen: Denies fever, chills, cold or flulike symptoms, presyncope, syncope. CV: Denies chest pain, palpitations. Resp: Denies dyspnea, cough.  GI: See HPI Heme: See HPI.  Physical Exam: There were no vitals taken for this visit. General:   Alert and oriented. No distress noted. Pleasant and cooperative.  Head:  Normocephalic and atraumatic. Eyes:  Conjuctiva clear without scleral icterus. Heart:  S1, S2 present without murmurs appreciated. Lungs:  Clear to auscultation bilaterally. No wheezes, rales, or rhonchi. No distress.  Abdomen:  +BS, soft, non-tender and non-distended. No rebound or guarding. No HSM or masses noted. Msk:  Symmetrical without gross deformities. Normal posture. Extremities:  Without edema. Neurologic:  Alert and  oriented x4 Psych:  Normal mood and affect.    Assessment:     Plan:  ***   Aliene Altes, PA-C [facility] 02/10/2022

## 2022-02-10 ENCOUNTER — Encounter: Payer: Self-pay | Admitting: Internal Medicine

## 2022-02-10 ENCOUNTER — Ambulatory Visit: Payer: Medicare Other | Admitting: Gastroenterology

## 2022-02-21 ENCOUNTER — Ambulatory Visit (INDEPENDENT_AMBULATORY_CARE_PROVIDER_SITE_OTHER): Payer: Medicare Other | Admitting: Gastroenterology

## 2022-02-21 ENCOUNTER — Encounter: Payer: Self-pay | Admitting: Gastroenterology

## 2022-02-21 VITALS — BP 128/86 | HR 81 | Temp 97.2°F | Ht 62.0 in | Wt 182.2 lb

## 2022-02-21 DIAGNOSIS — K219 Gastro-esophageal reflux disease without esophagitis: Secondary | ICD-10-CM | POA: Diagnosis not present

## 2022-02-21 DIAGNOSIS — R110 Nausea: Secondary | ICD-10-CM

## 2022-02-21 DIAGNOSIS — K5900 Constipation, unspecified: Secondary | ICD-10-CM

## 2022-02-21 DIAGNOSIS — R1310 Dysphagia, unspecified: Secondary | ICD-10-CM

## 2022-02-21 NOTE — Patient Instructions (Addendum)
Continue Dexilant 60 mg daily.   Try taking 1/2 tablet (2.5 mg) of Reglan in the morning and 1/2 tablet (2.5 mg) of Reglan in the evening before bed. If you have return of frequent nausea with decreasing the dose of Reglan, we can increase back to 5 mg if needed.  Continue to keep tight control of your blood sugars as this was likely the cause of your frequent nausea.  You should eat 4-6 small meals daily. Ensure not to eat within 3 hours of going to bed. Follow a low-fat and low fiber diet avoiding frequent consumption of raw fruits and vegetables.  Continue using Linzess 72 mcg as needed for constipation.  We will follow-up with you in about 6 months.  Do not hesitate to call if you have any questions or concerns prior to your next visit.  It was great to see you again today!  I am glad you are feeling much better!  Aliene Altes, PA-C [facility]

## 2022-02-21 NOTE — Progress Notes (Addendum)
Referring Provider: Monico Blitz, MD Primary Care Physician:  Monico Blitz, MD Primary GI Physician: Dr. Gala Romney  Chief Complaint  Patient presents with   Follow-up    3 month follow up. Pt stated nothing is bothering her today    HPI:   Carrie Turner is a 71 y.o. female female with history of chronic alk phos elevation with negative work-up in 2019, fatty liver on [place] in 2021, GERD, nausea, chronic epigastric pain, constipation, adenomatous colon polyps, prior random colon biopsies negative and small bowel biopsies negative for celiac.  History of poorly controlled diabetes which has been felt to be contributing to nausea.  GES February 2021 with borderline delayed gastric emptying.  RUQ ultrasound March 2021 without gallstones or wall thickening, CBD slightly prominent at 6 mm.  HIDA within normal limits in May 2021.  Ultimately underwent cholecystectomy in June 2021 per her request.      Presenting today for follow-up of GERD, nausea/vomiting, epigastric pain, dysphagia.  Last seen in our office 02/24/2021.  She continued to wake up with morning nausea, but no vomiting.  She was taking Reglan in the morning which resolved her nausea and had no return throughout the day.  She was also taking Reglan before dinner.  Primarily eating 1 meal a day, dinner, which typically consisted of either cheeseburger, hotdog, or TV dinners.  Also reported sweets were her weakness.  Typical reflux symptoms well controlled on Dexilant 60 mg daily.  Blood sugars continue to be uncontrolled and had an upcoming appointment with Dr. Dorris Fetch. Had noticed solid foods getting stuck in her mid to lower chest x1 month.  Reported she had loose stools for couple months after she saw me in December 2021, but bowels returned to normal over the last few weeks, using Linzess as needed.  Occasional upper abdominal pain prior to a bowel movement that improves thereafter.  Continued with chronic intermittent toilet tissue hematochezia  once every few months.  She was counseled extensively on gastroparesis diet, importance of good glycemic control.  Plan to continue current medications and arrange EGD with possible dilation.   EGD 04/15/2021 with normal esophagus s/p dilation, medium size hiatal hernia, otherwise normal exam.  Recommended taking Dexilant 30 minutes before lunch rather than 30 minutes for breakfast to combat late night/early morning exacerbations of reflux.  Today:   GERD:  Well-controlled on Dexilant 60 mg daily. No breakthrough symptoms.   Dysphagia:   Resolved.   Nausea/vomiting: Taking Reglan 5 mg in the morning and evening before bed. No side effects.  No longer having issues with nausea/vomiting.  Blood sugars are under better control and noticed this helped her nausea.  Typically, blood sugars are around 150.  No regular epigastric pain.  Occasional upper abdominal discomfort prior to bowel movement that resolves or after.  Bowels are moving well.  Uses Linzess 72 mcg as needed, about once a week.  No diarrhea, therapy PR, or melena.   Past Medical History:  Diagnosis Date   Anxiety    Arthritis    COPD (chronic obstructive pulmonary disease) ([place])    Delayed gastric emptying    GES completed on 08/27/2019 revealed borderline delayed gastric emptying; 87% emptying at 4 hours (normal greater than 90%)   Dementia ([place]) 09/30/2019   per patient, early dementia diagnosed after seeing PCP recently   Depression    Diabetes mellitus, type II ([place])    GERD (gastroesophageal reflux disease)    Glaucoma    Hyperlipidemia  Hypertension    Migraines    Neuropathy    Seizures ([place])    seizure was from ETOH, "a long time ago", no med and no recurrance   Sleep apnea    Vitamin D deficiency     Past Surgical History:  Procedure Laterality Date   ABDOMINAL HYSTERECTOMY     CATARACT EXTRACTION W/PHACO Left 07/24/2019   Procedure: CATARACT EXTRACTION PHACO AND INTRAOCULAR LENS PLACEMENT LEFT EYE   (CDE: 5.60);  Surgeon: Baruch Goldmann, MD;  Location: [facility];  Service: Ophthalmology;  Laterality: Left;   CATARACT EXTRACTION W/PHACO Right 08/19/2019   Procedure: CATARACT EXTRACTION PHACO AND INTRAOCULAR LENS PLACEMENT RIGHT EYE;  Surgeon: Baruch Goldmann, MD;  Location: [facility];  Service: Ophthalmology;  Laterality: Right;  CDE: 8.83   CHOLECYSTECTOMY N/A 12/25/2019   Procedure: LAPAROSCOPIC CHOLECYSTECTOMY;  Surgeon: Aviva Signs, MD;  Location: [facility];  Service: General;  Laterality: N/A;   COLONOSCOPY  11/2015   Dr. Britta Mccreedy: sessile polyp removed (benign). advised repeat colonoscopy in 5 years.    COLONOSCOPY WITH PROPOFOL N/A 10/26/2017   RMR: Tubular adenoma removed.  Random colon biopsies negative.  Nonbleeding internal hemorrhoids.  Next colonoscopy in 5 years.   ESOPHAGEAL DILATION  10/26/2017   Procedure: ESOPHAGEAL DILATION;  Surgeon: Daneil Dolin, MD;  [facility]: [facility];  Service: Endoscopy;;   ESOPHAGOGASTRODUODENOSCOPY  11/2015   Dr. Britta Mccreedy: hiatal hernia   ESOPHAGOGASTRODUODENOSCOPY (EGD) WITH PROPOFOL N/A 10/26/2017   RMR: Erosive reflux esophagitis, hiatal hernia.  Small bowel biopsies negative.   ESOPHAGOGASTRODUODENOSCOPY (EGD) WITH PROPOFOL N/A 04/15/2021   Procedure: ESOPHAGOGASTRODUODENOSCOPY (EGD) WITH PROPOFOL;  Surgeon: Daneil Dolin, MD;  [facility];  Service: Endoscopy;  Laterality: N/A;  8:45am - LM to see if pt could move up [place] (ISTENT) Left 07/24/2019   Procedure: INSERTION OF ANTERIOR SEGMENT AQUEOUS DRAINAGE DEVICE (ISTENT) LEFT EYE;  Surgeon: Baruch Goldmann, MD;  Location: [facility];  Service: Ophthalmology;  Laterality: Left;   INSERTION OF ANTERIOR SEGMENT AQUEOUS DRAINAGE DEVICE (ISTENT) Right 08/19/2019   Procedure: INSERTION OF ANTERIOR SEGMENT AQUEOUS DRAINAGE DEVICE (ISTENT) RIGHT EYE;  Surgeon: Baruch Goldmann, MD;  Location: [facility];  Service: Ophthalmology;  Laterality: Right;   KNEE  ARTHROSCOPY Right    MALONEY DILATION N/A 04/15/2021   Procedure: Venia Minks DILATION;  Surgeon: Daneil Dolin, MD;  [facility]: [facility];  Service: Endoscopy;  Laterality: N/A;   ORIF ANKLE FRACTURE Right     Current Outpatient Medications  Medication Sig Dispense Refill   albuterol (VENTOLIN HFA) 108 (90 Base) MCG/ACT inhaler Inhale 1-2 puffs into the lungs every 6 (six) hours as needed for wheezing or shortness of breath.     amLODipine (NORVASC) 5 MG tablet Take 5 mg by mouth daily.      aspirin EC 81 MG tablet Take 81 mg by mouth daily.     BELSOMRA 15 MG TABS Take 1 tablet by mouth at bedtime as needed.     Blood Glucose Monitoring Suppl (ONETOUCH VERIO) w/Device KIT 1 each by Does not apply route in the morning, at noon, in the evening, and at bedtime. 1 kit 0   Cholecalciferol (VITAMIN D3) 2000 units TABS Take 2,000 Units by mouth daily.     dexlansoprazole (DEXILANT) 60 MG capsule TAKE 1 CAPSULE BY MOUTH ONCE DAILY. 28 capsule 5   donepezil (ARICEPT) 10 MG tablet Take 10 mg by mouth daily.     fenofibrate (TRICOR)  145 MG tablet Take 1 tablet (145 mg total) by mouth daily. 90 tablet 3   gabapentin (NEURONTIN) 800 MG tablet Take 800 mg by mouth 3 (three) times daily.     Insulin Pen Needle (GLOBAL EASE INJECT PEN NEEDLES) 31G X 5 MM MISC USE 3 TIMES A DAY OR AS DIRECTED. 100 each 2   Insulin Pen Needle (PEN NEEDLES) 31G X 6 MM MISC 1 each by Does not apply route 4 (four) times daily. 400 each 1   latanoprost (XALATAN) 0.005 % ophthalmic solution SMARTSIG:In Eye(s)     LINZESS 72 MCG capsule Take 72 mcg by mouth every morning. As needed     lisinopril-hydrochlorothiazide (PRINZIDE,ZESTORETIC) 20-12.5 MG tablet Take 2 tablets by mouth daily.      LORazepam (ATIVAN) 0.5 MG tablet Take 0.5 mg by mouth daily as needed.     metoCLOPramide (REGLAN) 5 MG tablet Take 5 mg by mouth in the morning and at bedtime.     ondansetron (ZOFRAN) 8 MG tablet Take 1 tablet (8 mg total) by mouth every 8  (eight) hours as needed for nausea or vomiting. 30 tablet 0   ONETOUCH VERIO test strip USE 1 STRIP TO CHECK GLUCOSE 4 TIMES DAILY. 200 strip 1   potassium chloride (KLOR-CON M) 10 MEQ tablet Take 10 mEq by mouth daily.     pravastatin (PRAVACHOL) 40 MG tablet Take 1 tablet (40 mg total) by mouth daily. 90 tablet 3   PREMARIN vaginal cream SMARTSIG:1 Vaginal Every Night     pyridOXINE (VITAMIN B-6) 100 MG tablet Take 100 mg by mouth daily.     rosuvastatin (CRESTOR) 10 MG tablet Take 10 mg by mouth at bedtime.     Semaglutide,0.25 or 0.5MG/DOS, (OZEMPIC, 0.25 OR 0.5 MG/DOSE,) 2 MG/1.5ML SOPN Inject 0.5 mg into the skin once a week. 0.25 mg weekly x 2 weeks, then increase to 0.5 mg weekly thereafter 3 mL 3   temazepam (RESTORIL) 30 MG capsule Take 30 mg by mouth at bedtime as needed.     TRESIBA FLEXTOUCH 100 UNIT/ML FlexTouch Pen INJECT 60 UNITS INTO THE SKIN AT BEDTIME. 15 mL 2   venlafaxine XR (EFFEXOR-XR) 150 MG 24 hr capsule Take 150 mg by mouth daily.     vitamin B-12 (CYANOCOBALAMIN) 500 MCG tablet Take 500 mcg by mouth daily.     No current facility-administered medications for this visit.    Allergies as of 02/21/2022 - Review Complete 02/21/2022  Allergen Reaction Noted   Sulfa antibiotics Other (See Comments) 03/04/2016    Family History  Problem Relation Age of Onset   Hypertension Mother    Colon cancer Mother        diagnosed early 75s   Cerebral aneurysm Mother    Alcohol abuse Father    Pancreatic cancer Sister        deceased in 30s   Colon cancer Sister        diagnosed 71 y/o   Stroke Brother    Cancer Maternal Grandmother    Cancer Maternal Grandfather    Cancer Paternal Grandmother    Other Paternal Grandfather    Other Brother     Social History   Socioeconomic History   Marital status: Divorced    Spouse name: Not on file   Number of children: Not on file   Years of education: Not on file   Highest education level: Not on file  Occupational  History   Not on file  Tobacco Use  Smoking status: Never   Smokeless tobacco: Never  Vaping Use   Vaping Use: Never used  Substance and Sexual Activity   Alcohol use: No   Drug use: No   Sexual activity: Never  Other Topics Concern   Not on file  Social History Narrative   Not on file   Social Determinants of Health   Financial Resource Strain: Not on file  Food Insecurity: Not on file  Transportation Needs: Not on file  Physical Activity: Not on file  Stress: Not on file  Social Connections: Not on file    Review of Systems: Gen: Denies fever, chills, cold or flulike symptoms, presyncope, syncope. CV: Denies chest pain, palpitations. Resp: Denies dyspnea, cough. GI: See HPI Heme: See HPI  Physical Exam: BP 128/86   Pulse 81   Temp (!) 97.2 F (36.2 C)   Ht _0  (1.575 m)   Wt 182 lb 3.2 oz (82.6 kg)   BMI 33.32 kg/m  General:   Alert and oriented. No distress noted. Pleasant and cooperative.  Head:  Normocephalic and atraumatic. Eyes:  Conjuctiva clear without scleral icterus. Heart:  S1, S2 present without murmurs appreciated. Lungs:  Clear to auscultation bilaterally. No wheezes, rales, or rhonchi. No distress.  Abdomen:  +BS, soft, non-tender and non-distended. No rebound or guarding. No HSM or masses noted. Msk:  Symmetrical without gross deformities. Normal posture. Extremities:  Without edema. Neurologic:  Alert and  oriented x4 Psych:  Normal mood and affect.    Assessment:  71 y.o. female female with history of chronic alk phos elevation with negative work-up in 2019, fatty liver on [place] in 2021, GERD, nausea, chronic epigastric pain, constipation, adenomatous colon polyps, presenting today for follow-up.   GERD:  Well-controlled on Dexilant 60 mg daily.  Nausea without vomiting: Extensive work-up previously, detailed in HPI.  Now significantly improved/essentially resolved with better control of diabetes.  Symptoms likely secondary to  gastroparesis.  She had gastric emptying study in February 2021 with borderline delayed gastric emptying.  She has been on Reglan 5 mg twice daily for several months now. No adverse effects. As her symptoms have significantly improved, we will try decreasing Reglan to 2.5 mg twice daily and see how she does.  Reinforced gastroparesis diet.  Dysphagia: Resolved s/p EGD 04/15/2021 revealing normal esophagus s/p empiric dilation.    Chronic constipation: Well-controlled on Linzess 72 mcg as needed, typically about once a week.  No alarm symptoms.  Occasional upper abdominal pain prior to a bowel movement that resolves or after.   Plan:  Continue Dexilant 60 mg daily. Try decreasing Reglan to 2.5 mg in the morning and evening. Reinforced the importance of keeping tight glycemic control. 4-6 small meals daily. No eating within 3 hours of going to bed. Low-fat/low fiber diet avoiding frequent consumption of raw fruits and vegetables. Continue Linzess 72 mcg as needed for constipation. Follow-up in 6 months or sooner if needed.   Aliene Altes, PA-C [facility] 02/21/2022

## 2022-03-02 ENCOUNTER — Ambulatory Visit
Admission: RE | Admit: 2022-03-02 | Discharge: 2022-03-02 | Disposition: A | Discharge status: Home / Self Care | Payer: Medicare Other | Source: Ambulatory Visit | Attending: Internal Medicine | Admitting: Internal Medicine

## 2022-03-02 DIAGNOSIS — Z1231 Encounter for screening mammogram for malignant neoplasm of breast: Secondary | ICD-10-CM | POA: Diagnosis not present

## 2022-03-04 DIAGNOSIS — Z299 Encounter for prophylactic measures, unspecified: Secondary | ICD-10-CM | POA: Diagnosis not present

## 2022-03-04 DIAGNOSIS — E1165 Type 2 diabetes mellitus with hyperglycemia: Secondary | ICD-10-CM | POA: Diagnosis not present

## 2022-03-04 DIAGNOSIS — I10 Essential (primary) hypertension: Secondary | ICD-10-CM | POA: Diagnosis not present

## 2022-03-04 DIAGNOSIS — K5792 Diverticulitis of intestine, part unspecified, without perforation or abscess without bleeding: Secondary | ICD-10-CM | POA: Diagnosis not present

## 2022-03-30 ENCOUNTER — Other Ambulatory Visit: Payer: Self-pay | Admitting: "Endocrinology

## 2022-04-01 DIAGNOSIS — Z0000 Encounter for general adult medical examination without abnormal findings: Secondary | ICD-10-CM | POA: Diagnosis not present

## 2022-04-01 DIAGNOSIS — Z7189 Other specified counseling: Secondary | ICD-10-CM | POA: Diagnosis not present

## 2022-04-01 DIAGNOSIS — Z299 Encounter for prophylactic measures, unspecified: Secondary | ICD-10-CM | POA: Diagnosis not present

## 2022-04-01 DIAGNOSIS — R5383 Other fatigue: Secondary | ICD-10-CM | POA: Diagnosis not present

## 2022-04-01 DIAGNOSIS — Z23 Encounter for immunization: Secondary | ICD-10-CM | POA: Diagnosis not present

## 2022-04-01 DIAGNOSIS — I10 Essential (primary) hypertension: Secondary | ICD-10-CM | POA: Diagnosis not present

## 2022-04-01 DIAGNOSIS — E7800 Pure hypercholesterolemia, unspecified: Secondary | ICD-10-CM | POA: Diagnosis not present

## 2022-04-01 DIAGNOSIS — Z79899 Other long term (current) drug therapy: Secondary | ICD-10-CM | POA: Diagnosis not present

## 2022-04-01 DIAGNOSIS — E559 Vitamin D deficiency, unspecified: Secondary | ICD-10-CM | POA: Diagnosis not present

## 2022-04-11 DIAGNOSIS — Z961 Presence of intraocular lens: Secondary | ICD-10-CM | POA: Diagnosis not present

## 2022-04-11 DIAGNOSIS — E109 Type 1 diabetes mellitus without complications: Secondary | ICD-10-CM | POA: Diagnosis not present

## 2022-04-11 DIAGNOSIS — Z794 Long term (current) use of insulin: Secondary | ICD-10-CM | POA: Diagnosis not present

## 2022-04-11 DIAGNOSIS — H401131 Primary open-angle glaucoma, bilateral, mild stage: Secondary | ICD-10-CM | POA: Diagnosis not present

## 2022-04-11 LAB — HM DIABETES EYE EXAM

## 2022-04-27 ENCOUNTER — Other Ambulatory Visit: Payer: Self-pay | Admitting: "Endocrinology

## 2022-04-27 ENCOUNTER — Other Ambulatory Visit: Payer: Self-pay | Admitting: Nurse Practitioner

## 2022-05-02 ENCOUNTER — Ambulatory Visit: Payer: Medicare Other | Admitting: Nurse Practitioner

## 2022-05-06 DIAGNOSIS — G4700 Insomnia, unspecified: Secondary | ICD-10-CM | POA: Diagnosis not present

## 2022-05-06 DIAGNOSIS — E1165 Type 2 diabetes mellitus with hyperglycemia: Secondary | ICD-10-CM | POA: Diagnosis not present

## 2022-05-06 DIAGNOSIS — Z299 Encounter for prophylactic measures, unspecified: Secondary | ICD-10-CM | POA: Diagnosis not present

## 2022-05-06 DIAGNOSIS — E7800 Pure hypercholesterolemia, unspecified: Secondary | ICD-10-CM | POA: Diagnosis not present

## 2022-05-06 DIAGNOSIS — I10 Essential (primary) hypertension: Secondary | ICD-10-CM | POA: Diagnosis not present

## 2022-05-24 ENCOUNTER — Ambulatory Visit (INDEPENDENT_AMBULATORY_CARE_PROVIDER_SITE_OTHER): Payer: Medicare Other | Admitting: Nurse Practitioner

## 2022-05-24 ENCOUNTER — Encounter: Payer: Self-pay | Admitting: Nurse Practitioner

## 2022-05-24 VITALS — BP 110/76 | HR 100 | Ht 62.0 in | Wt 175.2 lb

## 2022-05-24 DIAGNOSIS — I10 Essential (primary) hypertension: Secondary | ICD-10-CM | POA: Diagnosis not present

## 2022-05-24 DIAGNOSIS — Z91199 Patient's noncompliance with other medical treatment and regimen due to unspecified reason: Secondary | ICD-10-CM | POA: Diagnosis not present

## 2022-05-24 DIAGNOSIS — E1165 Type 2 diabetes mellitus with hyperglycemia: Secondary | ICD-10-CM | POA: Diagnosis not present

## 2022-05-24 DIAGNOSIS — E782 Mixed hyperlipidemia: Secondary | ICD-10-CM

## 2022-05-24 DIAGNOSIS — Z794 Long term (current) use of insulin: Secondary | ICD-10-CM | POA: Diagnosis not present

## 2022-05-24 DIAGNOSIS — E559 Vitamin D deficiency, unspecified: Secondary | ICD-10-CM

## 2022-05-24 LAB — POCT UA - MICROALBUMIN: Creatinine, POC: 300 mg/dL

## 2022-05-24 LAB — POCT GLYCOSYLATED HEMOGLOBIN (HGB A1C): Hemoglobin A1C: 6.3 % — AB (ref 4.0–5.6)

## 2022-05-24 MED ORDER — SEMAGLUTIDE (1 MG/DOSE) 4 MG/3ML ~~LOC~~ SOPN: 1.0000 mg | PEN_INJECTOR | SUBCUTANEOUS | 3 refills | Status: DC

## 2022-05-24 MED ORDER — TRESIBA FLEXTOUCH 100 UNIT/ML ~~LOC~~ SOPN: 50.0000 [IU] | PEN_INJECTOR | Freq: Every day | SUBCUTANEOUS | 1 refills | Status: DC

## 2022-05-24 NOTE — Progress Notes (Signed)
05/24/2022  Endocrinology follow-up note  Subjective:    Patient ID: Carrie Turner, female    DOB: 1951/03/26.  She is being seen in follow-up  for management of diabetes requested by Monico Blitz, MD  Past Medical History:  Diagnosis Date   Anxiety    Arthritis    COPD (chronic obstructive pulmonary disease) ([place])    Delayed gastric emptying    GES completed on 08/27/2019 revealed borderline delayed gastric emptying; 87% emptying at 4 hours (normal greater than 90%)   Dementia ([place]) 09/30/2019   per patient, early dementia diagnosed after seeing PCP recently   Depression    Diabetes mellitus, type II ([place])    GERD (gastroesophageal reflux disease)    Glaucoma    Hyperlipidemia    Hypertension    Migraines    Neuropathy    Seizures ([place])    seizure was from ETOH, "a long time ago", no med and no recurrance   Sleep apnea    Vitamin D deficiency     Past Surgical History:  Procedure Laterality Date   ABDOMINAL HYSTERECTOMY     CATARACT EXTRACTION W/PHACO Left 07/24/2019   Procedure: CATARACT EXTRACTION PHACO AND INTRAOCULAR LENS PLACEMENT LEFT EYE  (CDE: 5.60);  Surgeon: Baruch Goldmann, MD;  Location: [facility];  Service: Ophthalmology;  Laterality: Left;   CATARACT EXTRACTION W/PHACO Right 08/19/2019   Procedure: CATARACT EXTRACTION PHACO AND INTRAOCULAR LENS PLACEMENT RIGHT EYE;  Surgeon: Baruch Goldmann, MD;  Location: [facility];  Service: Ophthalmology;  Laterality: Right;  CDE: 8.83   CHOLECYSTECTOMY N/A 12/25/2019   Procedure: LAPAROSCOPIC CHOLECYSTECTOMY;  Surgeon: Aviva Signs, MD;  Location: [facility];  Service: General;  Laterality: N/A;   COLONOSCOPY  11/2015   Dr. Britta Mccreedy: sessile polyp removed (benign). advised repeat colonoscopy in 5 years.    COLONOSCOPY WITH PROPOFOL N/A 10/26/2017   RMR: Tubular adenoma removed.  Random colon biopsies negative.  Nonbleeding internal hemorrhoids.  Next colonoscopy in 5 years.   ESOPHAGEAL DILATION  10/26/2017   Procedure: ESOPHAGEAL  DILATION;  Surgeon: Daneil Dolin, MD;  [facility]: [facility];  Service: Endoscopy;;   ESOPHAGOGASTRODUODENOSCOPY  11/2015   Dr. Britta Mccreedy: hiatal hernia   ESOPHAGOGASTRODUODENOSCOPY (EGD) WITH PROPOFOL N/A 10/26/2017   RMR: Erosive reflux esophagitis, hiatal hernia.  Small bowel biopsies negative.   ESOPHAGOGASTRODUODENOSCOPY (EGD) WITH PROPOFOL N/A 04/15/2021   Procedure: ESOPHAGOGASTRODUODENOSCOPY (EGD) WITH PROPOFOL;  Surgeon: Daneil Dolin, MD;  [facility];  Service: Endoscopy;  Laterality: N/A;  8:45am - LM to see if pt could move up [place] (ISTENT) Left 07/24/2019   Procedure: INSERTION OF ANTERIOR SEGMENT AQUEOUS DRAINAGE DEVICE (ISTENT) LEFT EYE;  Surgeon: Baruch Goldmann, MD;  Location: [facility];  Service: Ophthalmology;  Laterality: Left;   INSERTION OF ANTERIOR SEGMENT AQUEOUS DRAINAGE DEVICE (ISTENT) Right 08/19/2019   Procedure: INSERTION OF ANTERIOR SEGMENT AQUEOUS DRAINAGE DEVICE (ISTENT) RIGHT EYE;  Surgeon: Baruch Goldmann, MD;  Location: [facility];  Service: Ophthalmology;  Laterality: Right;   KNEE ARTHROSCOPY Right    MALONEY DILATION N/A 04/15/2021   Procedure: Venia Minks DILATION;  Surgeon: Daneil Dolin, MD;  [facility];  Service: Endoscopy;  Laterality: N/A;   ORIF ANKLE FRACTURE Right      Social History   Socioeconomic History   Marital status: Divorced    Spouse name: Not on file   Number of children: Not on file   Years of education: Not on file  Highest education level: Not on file  Occupational History   Not on file  Tobacco Use   Smoking status: Never   Smokeless tobacco: Never  Vaping Use   Vaping Use: Never used  Substance and Sexual Activity   Alcohol use: No   Drug use: No   Sexual activity: Never  Other Topics Concern   Not on file  Social History Narrative   Not on file   Social Determinants of Health   Financial Resource Strain: Not on file  Food Insecurity: Not on  file  Transportation Needs: Not on file  Physical Activity: Not on file  Stress: Not on file  Social Connections: Not on file  Intimate Partner Violence: Not on file    Current Outpatient Medications on File Prior to Visit  Medication Sig Dispense Refill   albuterol (VENTOLIN HFA) 108 (90 Base) MCG/ACT inhaler Inhale 1-2 puffs into the lungs every 6 (six) hours as needed for wheezing or shortness of breath.     amLODipine (NORVASC) 5 MG tablet Take 5 mg by mouth daily.      aspirin EC 81 MG tablet Take 81 mg by mouth daily.     BELSOMRA 15 MG TABS Take 1 tablet by mouth at bedtime as needed.     Blood Glucose Monitoring Suppl (ONETOUCH VERIO) w/Device KIT 1 each by Does not apply route in the morning, at noon, in the evening, and at bedtime. 1 kit 0   Cholecalciferol (VITAMIN D3) 2000 units TABS Take 2,000 Units by mouth daily.     dexlansoprazole (DEXILANT) 60 MG capsule TAKE 1 CAPSULE BY MOUTH ONCE DAILY. 28 capsule 5   donepezil (ARICEPT) 10 MG tablet Take 10 mg by mouth daily.     fenofibrate (TRICOR) 145 MG tablet Take 1 tablet (145 mg total) by mouth daily. 90 tablet 3   gabapentin (NEURONTIN) 800 MG tablet Take 800 mg by mouth 3 (three) times daily.     Insulin Pen Needle (GLOBAL EASE INJECT PEN NEEDLES) 31G X 5 MM MISC USE 3 TIMES A DAY OR AS DIRECTED. 100 each 2   Insulin Pen Needle (PEN NEEDLES) 31G X 6 MM MISC 1 each by Does not apply route 4 (four) times daily. 400 each 1   latanoprost (XALATAN) 0.005 % ophthalmic solution SMARTSIG:In Eye(s)     LINZESS 72 MCG capsule Take 72 mcg by mouth every morning. As needed     lisinopril-hydrochlorothiazide (PRINZIDE,ZESTORETIC) 20-12.5 MG tablet Take 2 tablets by mouth daily.      metoCLOPramide (REGLAN) 5 MG tablet Take 5 mg by mouth in the morning and at bedtime.     ondansetron (ZOFRAN) 8 MG tablet Take 1 tablet (8 mg total) by mouth every 8 (eight) hours as needed for nausea or vomiting. 30 tablet 0   ONETOUCH VERIO test strip USE 1  STRIP TO CHECK GLUCOSE 4 TIMES DAILY. 150 strip 2   potassium chloride (KLOR-CON M) 10 MEQ tablet Take 10 mEq by mouth daily.     pravastatin (PRAVACHOL) 40 MG tablet Take 1 tablet (40 mg total) by mouth daily. 90 tablet 3   PREMARIN vaginal cream SMARTSIG:1 Vaginal Every Night     pyridOXINE (VITAMIN B-6) 100 MG tablet Take 100 mg by mouth daily.     rosuvastatin (CRESTOR) 10 MG tablet Take 10 mg by mouth at bedtime.     temazepam (RESTORIL) 30 MG capsule Take 30 mg by mouth at bedtime as needed.     venlafaxine XR (  EFFEXOR-XR) 150 MG 24 hr capsule Take 150 mg by mouth daily.     vitamin B-12 (CYANOCOBALAMIN) 500 MCG tablet Take 500 mcg by mouth daily.     LORazepam (ATIVAN) 0.5 MG tablet Take 0.5 mg by mouth daily as needed. (Patient not taking: Reported on 05/24/2022)     No current facility-administered medications on file prior to visit.    No facility-administered encounter medications on file as of 10/16/2018.    Allergies  Allergen Reactions   Sulfa Antibiotics Other (See Comments)    Weakness and fatigued      Diabetes She presents for her follow-up diabetic visit. She has type 2 diabetes mellitus. Onset time: diagnosed at approximate age of 89. Her disease course has been improving. There are no hypoglycemic associated symptoms. Pertinent negatives for hypoglycemia include no confusion, headaches, mood changes or seizures. Associated symptoms include blurred vision, fatigue, foot paresthesias and weight loss. Pertinent negatives for diabetes include no chest pain, no polydipsia, no polyphagia and no polyuria. There are no hypoglycemic complications. Symptoms are improving. Diabetic complications include peripheral neuropathy. Risk factors for coronary artery disease include diabetes mellitus, dyslipidemia, hypertension, obesity, post-menopausal and sedentary lifestyle. Current diabetic treatment includes insulin injections (and Ozempic). She is compliant with treatment most of the  time. Her weight is decreasing steadily. She is following a generally healthy diet. When asked about meal planning, she reported none. She has had a previous visit with a dietitian. She rarely participates in exercise. Her home blood glucose trend is decreasing steadily. Her overall blood glucose range is 110-130 mg/dl. (She presents today with her meter and logs showing mostly at target glycemic profile.  Her POCT A1c today is 6.3%, improving from last visit of 7.9%.  She has lost 7 lbs since last visit and is feeling better.  Analysis of her meter shows 7-day average of 125, 14-day average of 124, 30-day average of 128, 90-day average of 136.  She denies any significant hypoglycemia.) An ACE inhibitor/angiotensin II receptor blocker is being taken. She does not see a podiatrist.Eye exam is current.  Hyperlipidemia This is a chronic problem. The current episode started more than 1 year ago. The problem is resistant. Recent lipid tests were reviewed and are high. Exacerbating diseases include diabetes and obesity. Factors aggravating her hyperlipidemia include thiazides. Pertinent negatives include no chest pain, leg pain, myalgias or shortness of breath. Current antihyperlipidemic treatment includes fibric acid derivatives and statins. The current treatment provides mild improvement of lipids. Compliance problems include adherence to exercise, adherence to diet and psychosocial issues.  Risk factors for coronary artery disease include diabetes mellitus, dyslipidemia, hypertension, obesity, post-menopausal and a sedentary lifestyle.  Hypertension This is a chronic problem. The current episode started more than 1 year ago. The problem has been waxing and waning since onset. The problem is uncontrolled. Associated symptoms include blurred vision. Pertinent negatives include no chest pain, headaches or shortness of breath. There are no associated agents to hypertension. Risk factors for coronary artery disease  include diabetes mellitus, sedentary lifestyle, obesity, dyslipidemia and post-menopausal state. Past treatments include calcium channel blockers, ACE inhibitors and diuretics. The current treatment provides mild improvement. Compliance problems include diet, exercise and psychosocial issues.      Review of systems  Constitutional: + steadily decreasing body weight,  current Body mass index is 32.04 kg/m. , + fatigue, no subjective hyperthermia, no subjective hypothermia Eyes: no blurry vision, no xerophthalmia ENT: no sore throat, no nodules palpated in throat, no dysphagia/odynophagia, no hoarseness  Cardiovascular: no chest pain, no shortness of breath, no palpitations, no leg swelling Respiratory: no cough, no shortness of breath Gastrointestinal: no nausea/vomiting/diarrhea Musculoskeletal: no muscle/joint aches Skin: no rashes, no hyperemia Neurological: no tremors, no numbness, no tingling, no dizziness Psychiatric: no depression, no anxiety, + significant insomnia   Objective:    BP 110/76 (BP Location: Right Arm, Patient Position: Sitting, Cuff Size: Large)   Pulse 100   Ht _0  (1.575 m)   Wt 175 lb 3.2 oz (79.5 kg)   BMI 32.04 kg/m   Wt Readings from Last 3 Encounters:  05/24/22 175 lb 3.2 oz (79.5 kg)  02/21/22 182 lb 3.2 oz (82.6 kg)  01/27/22 184 lb (83.5 kg)   BP Readings from Last 3 Encounters:  05/24/22 110/76  02/21/22 128/86  01/27/22 (!) 154/87     Physical Exam- Limited  Constitutional:  Body mass index is 32.04 kg/m. , not in acute distress, normal state of mind Eyes:  EOMI, no exophthalmos Neck: Supple Cardiovascular: RRR, no murmurs, rubs, or gallops, no edema Respiratory: Adequate breathing efforts, no crackles, rales, rhonchi, or wheezing Musculoskeletal: no gross deformities, strength intact in all four extremities, no gross restriction of joint movements Skin:  no rashes, no hyperemia Neurological: no tremor with outstretched  hands    Diabetic Foot Exam - Simple   No data filed     Lipid Panel     Component Value Date/Time   CHOL 383 (A) 02/17/2021 0000   TRIG 373 (A) 02/17/2021 0000   HDL 61 02/17/2021 0000   LDLCALC 247 02/17/2021 0000     Assessment & Plan:   1) Controlled type 2 diabetes mellitus with complication, with long-term current use of insulin (HCC)  - Patient has currently uncontrolled symptomatic type 2 DM since 71 years of age.  She presents today with her meter and logs showing mostly at target glycemic profile.  Her POCT A1c today is 6.3%, improving from last visit of 7.9%.  She has lost 7 lbs since last visit and is feeling better.  Analysis of her meter shows 7-day average of 125, 14-day average of 124, 30-day average of 128, 90-day average of 136.  She denies any significant hypoglycemia.   - Recent labs are reviewed.   Her diabetes is complicated by noncompliance/nonadherence, obesity and sedentary life and patient remains at a high risk for more acute and chronic complications of diabetes which include CAD, CVA, CKD, retinopathy, and neuropathy. These are all discussed in detail with the patient.  - Nutritional counseling repeated at each appointment due to patients tendency to fall back in to old habits.  - The patient admits there is a room for improvement in their diet and drink choices. -  Suggestion is made for the patient to avoid simple carbohydrates from their diet including Cakes, Sweet Desserts / Pastries, Ice Cream, Soda (diet and regular), Sweet Tea, Candies, Chips, Cookies, Sweet Pastries, Store Bought Juices, Alcohol in Excess of 1-2 drinks a day, Artificial Sweeteners, Coffee Creamer, and "Sugar-free" Products. This will help patient to have stable blood glucose profile and potentially avoid unintended weight gain.   - I encouraged the patient to switch to unprocessed or minimally processed complex starch and increased protein intake (animal or plant source),  fruits, and vegetables.   - Patient is advised to stick to a routine mealtimes to eat 3 meals a day and avoid unnecessary snacks (to snack only to correct hypoglycemia).  - I have approached patient with the following  individualized plan to manage diabetes and patient agrees:   -she is struggling to achieve control of diabetes, likely deals with moderate cognitive deficit. Avoiding hypoglycemia is the #1 priority in her case.   -She is advised to increase her Ozempic to 1 mg SQ weekly once she has finished her current supply of 0.5 mg doses.  She can lower her dose of Tresiba to 50 units SQ daily.   -She does not tolerate Metformin due to GI side effects.  -She is encouraged to start consistently monitoring blood glucose twice daily, before breakfast and before bed, and to call the clinic if she has readings less than 70 or above 300 for 3 tests in a row.  -She is not a good candidate for incretin therapy due to her hypertriglyceridemia increasing her risk of pancreatitis.    2) Lipids/HPL:  Her most recent lipid panel from 04/01/22 shows uncontrolled LDL of 120 and high triglycerides of 333.  She is advised to continue Crestor 10 mg po daily at bedtime and continue Fenofibrate 145 mg po daily.    3) Hypertension: -Her blood pressure is controlled to target.  She is advised to continue Norvasc 5 mg po daily, Lisinopril-HCT 20/12.5 mg po daily and follow up with Dr. Manuella Ghazi regarding her meds.   She is advised to continue follow-up closely with Dr. Manuella Ghazi for primary care needs.     I spent 45 minutes in the care of the patient today including review of labs from [facility], Lipids, Thyroid Function, Hematology (current and previous including abstractions from other facilities); face-to-face time discussing  her blood glucose readings/logs, discussing hypoglycemia and hyperglycemia episodes and symptoms, medications doses, her options of short and long term treatment based on the latest standards of  care / guidelines;  discussion about incorporating lifestyle medicine;  and documenting the encounter. Risk reduction counseling performed per USPSTF guidelines to reduce obesity and cardiovascular risk factors.     Please refer to Patient Instructions for Blood Glucose Monitoring and Insulin/Medications Dosing Guide"  in media tab for additional information. Please  also refer to " Patient Self Inventory" in the Media  tab for reviewed elements of pertinent patient history.  Orlena Sheldon participated in the discussions, expressed understanding, and voiced agreement with the above plans.  All questions were answered to her satisfaction. she is encouraged to contact clinic should she have any questions or concerns prior to her return visit.   Follow up plan: Return in about 3 months (around 08/24/2022) for Diabetes F/U with A1c in office, No previsit labs, Bring meter and logs.   Rayetta Pigg, [facility] [address] [place], [place] [address] Phone: [phone] Fax: [phone]

## 2022-05-25 DIAGNOSIS — R0683 Snoring: Secondary | ICD-10-CM | POA: Diagnosis not present

## 2022-05-25 DIAGNOSIS — E1165 Type 2 diabetes mellitus with hyperglycemia: Secondary | ICD-10-CM | POA: Diagnosis not present

## 2022-05-25 DIAGNOSIS — Z299 Encounter for prophylactic measures, unspecified: Secondary | ICD-10-CM | POA: Diagnosis not present

## 2022-05-25 DIAGNOSIS — G4700 Insomnia, unspecified: Secondary | ICD-10-CM | POA: Diagnosis not present

## 2022-05-25 DIAGNOSIS — I10 Essential (primary) hypertension: Secondary | ICD-10-CM | POA: Diagnosis not present

## 2022-05-26 ENCOUNTER — Other Ambulatory Visit: Payer: Self-pay | Admitting: "Endocrinology

## 2022-05-26 ENCOUNTER — Other Ambulatory Visit: Payer: Self-pay | Admitting: Gastroenterology

## 2022-06-10 DIAGNOSIS — G4730 Sleep apnea, unspecified: Secondary | ICD-10-CM | POA: Diagnosis not present

## 2022-06-21 ENCOUNTER — Other Ambulatory Visit: Payer: Self-pay | Admitting: "Endocrinology

## 2022-07-08 DIAGNOSIS — G4733 Obstructive sleep apnea (adult) (pediatric): Secondary | ICD-10-CM | POA: Diagnosis not present

## 2022-07-08 DIAGNOSIS — I10 Essential (primary) hypertension: Secondary | ICD-10-CM | POA: Diagnosis not present

## 2022-07-08 DIAGNOSIS — Z713 Dietary counseling and surveillance: Secondary | ICD-10-CM | POA: Diagnosis not present

## 2022-07-08 DIAGNOSIS — Z299 Encounter for prophylactic measures, unspecified: Secondary | ICD-10-CM | POA: Diagnosis not present

## 2022-07-15 ENCOUNTER — Other Ambulatory Visit: Payer: Self-pay | Admitting: "Endocrinology

## 2022-08-08 DIAGNOSIS — E1165 Type 2 diabetes mellitus with hyperglycemia: Secondary | ICD-10-CM | POA: Diagnosis not present

## 2022-08-08 DIAGNOSIS — G4700 Insomnia, unspecified: Secondary | ICD-10-CM | POA: Diagnosis not present

## 2022-08-08 DIAGNOSIS — I10 Essential (primary) hypertension: Secondary | ICD-10-CM | POA: Diagnosis not present

## 2022-08-08 DIAGNOSIS — Z299 Encounter for prophylactic measures, unspecified: Secondary | ICD-10-CM | POA: Diagnosis not present

## 2022-08-11 ENCOUNTER — Other Ambulatory Visit: Payer: Self-pay | Admitting: "Endocrinology

## 2022-08-24 ENCOUNTER — Encounter: Payer: Self-pay | Admitting: Nurse Practitioner

## 2022-08-24 ENCOUNTER — Ambulatory Visit (INDEPENDENT_AMBULATORY_CARE_PROVIDER_SITE_OTHER): Payer: 59 | Admitting: Nurse Practitioner

## 2022-08-24 VITALS — BP 106/68 | HR 79 | Ht 62.0 in | Wt 168.0 lb

## 2022-08-24 DIAGNOSIS — E782 Mixed hyperlipidemia: Secondary | ICD-10-CM

## 2022-08-24 DIAGNOSIS — I10 Essential (primary) hypertension: Secondary | ICD-10-CM | POA: Diagnosis not present

## 2022-08-24 DIAGNOSIS — E559 Vitamin D deficiency, unspecified: Secondary | ICD-10-CM | POA: Diagnosis not present

## 2022-08-24 DIAGNOSIS — Z91199 Patient's noncompliance with other medical treatment and regimen due to unspecified reason: Secondary | ICD-10-CM | POA: Diagnosis not present

## 2022-08-24 DIAGNOSIS — E1165 Type 2 diabetes mellitus with hyperglycemia: Secondary | ICD-10-CM

## 2022-08-24 DIAGNOSIS — Z794 Long term (current) use of insulin: Secondary | ICD-10-CM

## 2022-08-24 LAB — POCT GLYCOSYLATED HEMOGLOBIN (HGB A1C): Hemoglobin A1C: 6.3 % — AB (ref 4.0–5.6)

## 2022-08-24 MED ORDER — TRESIBA FLEXTOUCH 100 UNIT/ML ~~LOC~~ SOPN: 40.0000 [IU] | PEN_INJECTOR | Freq: Every day | SUBCUTANEOUS | 0 refills | Status: DC

## 2022-08-24 NOTE — Progress Notes (Signed)
08/24/2022  Endocrinology follow-up note  Subjective:    Patient ID: Carrie Turner, female    DOB: July 22, 1951.  She is being seen in follow-up  for management of diabetes requested by Monico Blitz, MD  Past Medical History:  Diagnosis Date   Anxiety    Arthritis    COPD (chronic obstructive pulmonary disease) ([place])    Delayed gastric emptying    GES completed on 08/27/2019 revealed borderline delayed gastric emptying; 87% emptying at 4 hours (normal greater than 90%)   Dementia ([place]) 09/30/2019   per patient, early dementia diagnosed after seeing PCP recently   Depression    Diabetes mellitus, type II ([place])    GERD (gastroesophageal reflux disease)    Glaucoma    Hyperlipidemia    Hypertension    Migraines    Neuropathy    Seizures ([place])    seizure was from ETOH, "a long time ago", no med and no recurrance   Sleep apnea    Vitamin D deficiency     Past Surgical History:  Procedure Laterality Date   ABDOMINAL HYSTERECTOMY     CATARACT EXTRACTION W/PHACO Left 07/24/2019   Procedure: CATARACT EXTRACTION PHACO AND INTRAOCULAR LENS PLACEMENT LEFT EYE  (CDE: 5.60);  Surgeon: Baruch Goldmann, MD;  Location: [facility];  Service: Ophthalmology;  Laterality: Left;   CATARACT EXTRACTION W/PHACO Right 08/19/2019   Procedure: CATARACT EXTRACTION PHACO AND INTRAOCULAR LENS PLACEMENT RIGHT EYE;  Surgeon: Baruch Goldmann, MD;  Location: [facility];  Service: Ophthalmology;  Laterality: Right;  CDE: 8.83   CHOLECYSTECTOMY N/A 12/25/2019   Procedure: LAPAROSCOPIC CHOLECYSTECTOMY;  Surgeon: Aviva Signs, MD;  Location: [facility];  Service: General;  Laterality: N/A;   COLONOSCOPY  11/2015   Dr. Britta Mccreedy: sessile polyp removed (benign). advised repeat colonoscopy in 5 years.    COLONOSCOPY WITH PROPOFOL N/A 10/26/2017   RMR: Tubular adenoma removed.  Random colon biopsies negative.  Nonbleeding internal hemorrhoids.  Next colonoscopy in 5 years.   ESOPHAGEAL DILATION  10/26/2017   Procedure: ESOPHAGEAL DILATION;   Surgeon: Daneil Dolin, MD;  [facility];  Service: Endoscopy;;   ESOPHAGOGASTRODUODENOSCOPY  11/2015   Dr. Britta Mccreedy: hiatal hernia   ESOPHAGOGASTRODUODENOSCOPY (EGD) WITH PROPOFOL N/A 10/26/2017   RMR: Erosive reflux esophagitis, hiatal hernia.  Small bowel biopsies negative.   ESOPHAGOGASTRODUODENOSCOPY (EGD) WITH PROPOFOL N/A 04/15/2021   Procedure: ESOPHAGOGASTRODUODENOSCOPY (EGD) WITH PROPOFOL;  Surgeon: Daneil Dolin, MD;  [facility];  Service: Endoscopy;  Laterality: N/A;  8:45am - LM to see if pt could move up [place] (ISTENT) Left 07/24/2019   Procedure: INSERTION OF ANTERIOR SEGMENT AQUEOUS DRAINAGE DEVICE (ISTENT) LEFT EYE;  Surgeon: Baruch Goldmann, MD;  Location: [facility];  Service: Ophthalmology;  Laterality: Left;   INSERTION OF ANTERIOR SEGMENT AQUEOUS DRAINAGE DEVICE (ISTENT) Right 08/19/2019   Procedure: INSERTION OF ANTERIOR SEGMENT AQUEOUS DRAINAGE DEVICE (ISTENT) RIGHT EYE;  Surgeon: Baruch Goldmann, MD;  Location: [facility];  Service: Ophthalmology;  Laterality: Right;   KNEE ARTHROSCOPY Right    MALONEY DILATION N/A 04/15/2021   Procedure: Venia Minks DILATION;  Surgeon: Daneil Dolin, MD;  [facility];  Service: Endoscopy;  Laterality: N/A;   ORIF ANKLE FRACTURE Right      Social History   Socioeconomic History   Marital status: Divorced    Spouse name: Not on file   Number of children: Not on file   Years of education: Not on file  Highest education level: Not on file  Occupational History   Not on file  Tobacco Use   Smoking status: Never   Smokeless tobacco: Never  Vaping Use   Vaping Use: Never used  Substance and Sexual Activity   Alcohol use: No   Drug use: No   Sexual activity: Never  Other Topics Concern   Not on file  Social History Narrative   Not on file   Social Determinants of Health   Financial Resource Strain: Not on file  Food Insecurity: Not on file   Transportation Needs: Not on file  Physical Activity: Not on file  Stress: Not on file  Social Connections: Not on file  Intimate Partner Violence: Not on file    Current Outpatient Medications on File Prior to Visit  Medication Sig Dispense Refill   albuterol (VENTOLIN HFA) 108 (90 Base) MCG/ACT inhaler Inhale 1-2 puffs into the lungs every 6 (six) hours as needed for wheezing or shortness of breath.     amLODipine (NORVASC) 5 MG tablet Take 5 mg by mouth daily.      aspirin EC 81 MG tablet Take 81 mg by mouth daily.     BELSOMRA 15 MG TABS Take 1 tablet by mouth at bedtime as needed.     Blood Glucose Monitoring Suppl (ONETOUCH VERIO) w/Device KIT 1 each by Does not apply route in the morning, at noon, in the evening, and at bedtime. 1 kit 0   Cholecalciferol (VITAMIN D3) 2000 units TABS Take 2,000 Units by mouth daily.     dexlansoprazole (DEXILANT) 60 MG capsule TAKE 1 CAPSULE BY MOUTH ONCE DAILY. 30 capsule 5   donepezil (ARICEPT) 10 MG tablet Take 10 mg by mouth daily.     fenofibrate (TRICOR) 145 MG tablet Take 1 tablet (145 mg total) by mouth daily. 90 tablet 3   gabapentin (NEURONTIN) 800 MG tablet Take 800 mg by mouth 3 (three) times daily.     Insulin Pen Needle (GLOBAL EASE INJECT PEN NEEDLES) 31G X 5 MM MISC USE UP TO 4 TIMES A DAY AS DIRECTED. 100 each 0   Insulin Pen Needle (PEN NEEDLES) 31G X 6 MM MISC 1 each by Does not apply route 4 (four) times daily. 400 each 1   latanoprost (XALATAN) 0.005 % ophthalmic solution SMARTSIG:In Eye(s)     LINZESS 72 MCG capsule Take 72 mcg by mouth every morning. As needed     lisinopril-hydrochlorothiazide (PRINZIDE,ZESTORETIC) 20-12.5 MG tablet Take 2 tablets by mouth daily.      metoCLOPramide (REGLAN) 5 MG tablet Take 5 mg by mouth in the morning and at bedtime.     ondansetron (ZOFRAN) 8 MG tablet Take 1 tablet (8 mg total) by mouth every 8 (eight) hours as needed for nausea or vomiting. 30 tablet 0   ONETOUCH VERIO test strip USE 1  STRIP TO CHECK GLUCOSE 4 TIMES DAILY. 150 strip 2   potassium chloride (KLOR-CON M) 10 MEQ tablet Take 10 mEq by mouth daily.     pravastatin (PRAVACHOL) 40 MG tablet Take 1 tablet (40 mg total) by mouth daily. 90 tablet 3   PREMARIN vaginal cream SMARTSIG:1 Vaginal Every Night     pyridOXINE (VITAMIN B-6) 100 MG tablet Take 100 mg by mouth daily.     rosuvastatin (CRESTOR) 10 MG tablet Take 10 mg by mouth at bedtime.     Semaglutide, 1 MG/DOSE, 4 MG/3ML SOPN Inject 1 mg as directed once a week. 6 mL 3  temazepam (RESTORIL) 30 MG capsule Take 30 mg by mouth at bedtime as needed.     venlafaxine XR (EFFEXOR-XR) 150 MG 24 hr capsule Take 150 mg by mouth daily.     vitamin B-12 (CYANOCOBALAMIN) 500 MCG tablet Take 500 mcg by mouth daily.     LORazepam (ATIVAN) 0.5 MG tablet Take 0.5 mg by mouth daily as needed. (Patient not taking: Reported on 05/24/2022)     No current facility-administered medications on file prior to visit.    No facility-administered encounter medications on file as of 10/16/2018.    Allergies  Allergen Reactions   Sulfa Antibiotics Other (See Comments)    Weakness and fatigued      Diabetes She presents for her follow-up diabetic visit. She has type 2 diabetes mellitus. Onset time: diagnosed at approximate age of 10. Her disease course has been stable. There are no hypoglycemic associated symptoms. Pertinent negatives for hypoglycemia include no confusion, headaches, mood changes or seizures. Associated symptoms include blurred vision, fatigue, foot paresthesias and weight loss. Pertinent negatives for diabetes include no chest pain, no polydipsia, no polyphagia and no polyuria. There are no hypoglycemic complications. Symptoms are improving. Diabetic complications include peripheral neuropathy. Risk factors for coronary artery disease include diabetes mellitus, dyslipidemia, hypertension, obesity, post-menopausal and sedentary lifestyle. Current diabetic treatment  includes insulin injections (and Ozempic). She is compliant with treatment most of the time. Her weight is decreasing steadily. She is following a generally healthy diet. When asked about meal planning, she reported none. She has had a previous visit with a dietitian. She rarely participates in exercise. Her home blood glucose trend is decreasing steadily. Her overall blood glucose range is 110-130 mg/dl. (She presents today with her meter and logs showing inconsistent glucose monitoring with at goal glycemic profile overall.  Her POCT A1c today is 6.3%, unchanged from previous visit.  Analysis of her meter shows 7-day average of 88 with 1 reading; 14-day average of 133 with 3 readings; 30-day average of 134 with 6 readings; 90-day average of 139 with 31 readings.  She did note some mild hypoglycemia at times.) An ACE inhibitor/angiotensin II receptor blocker is being taken. She does not see a podiatrist.Eye exam is current.  Hyperlipidemia This is a chronic problem. The current episode started more than 1 year ago. The problem is resistant. Recent lipid tests were reviewed and are high. Exacerbating diseases include diabetes and obesity. Factors aggravating her hyperlipidemia include thiazides. Pertinent negatives include no chest pain, leg pain, myalgias or shortness of breath. Current antihyperlipidemic treatment includes fibric acid derivatives and statins. The current treatment provides mild improvement of lipids. Compliance problems include adherence to exercise, adherence to diet and psychosocial issues.  Risk factors for coronary artery disease include diabetes mellitus, dyslipidemia, hypertension, obesity, post-menopausal and a sedentary lifestyle.  Hypertension This is a chronic problem. The current episode started more than 1 year ago. The problem has been waxing and waning since onset. The problem is uncontrolled. Associated symptoms include blurred vision. Pertinent negatives include no chest pain,  headaches or shortness of breath. There are no associated agents to hypertension. Risk factors for coronary artery disease include diabetes mellitus, sedentary lifestyle, obesity, dyslipidemia and post-menopausal state. Past treatments include calcium channel blockers, ACE inhibitors and diuretics. The current treatment provides mild improvement. Compliance problems include diet, exercise and psychosocial issues.      Review of systems  Constitutional: + steadily decreasing body weight,  current Body mass index is 30.73 kg/m. , + fatigue, no  subjective hyperthermia, no subjective hypothermia Eyes: no blurry vision, no xerophthalmia ENT: no sore throat, no nodules palpated in throat, no dysphagia/odynophagia, no hoarseness Cardiovascular: no chest pain, no shortness of breath, no palpitations, no leg swelling Respiratory: no cough, no shortness of breath Gastrointestinal: no nausea/vomiting/diarrhea, + constipation Musculoskeletal: no muscle/joint aches Skin: no rashes, no hyperemia Neurological: no tremors, no numbness, no tingling, no dizziness Psychiatric: no depression, no anxiety, + significant insomnia- recently improved on Lunesta   Objective:    BP 106/68 (BP Location: Left Arm, Patient Position: Sitting, Cuff Size: Normal)   Pulse 79   Ht '5\' 2"'$  (1.575 m)   Wt 168 lb (76.2 kg)   BMI 30.73 kg/m   Wt Readings from Last 3 Encounters:  08/24/22 168 lb (76.2 kg)  05/24/22 175 lb 3.2 oz (79.5 kg)  02/21/22 182 lb 3.2 oz (82.6 kg)   BP Readings from Last 3 Encounters:  08/24/22 106/68  05/24/22 110/76  02/21/22 128/86     Physical Exam- Limited  Constitutional:  Body mass index is 30.73 kg/m. , not in acute distress, normal state of mind Eyes:  EOMI, no exophthalmos Musculoskeletal: no gross deformities, strength intact in all four extremities, no gross restriction of joint movements Skin:  no rashes, no hyperemia Neurological: no tremor with outstretched  hands    Diabetic Foot Exam - Simple   Simple Foot Form Diabetic Foot exam was performed with the following findings: Yes 08/24/2022  2:55 PM  Visual Inspection No deformities, no ulcerations, no other skin breakdown bilaterally: Yes Sensation Testing See comments: Yes Pulse Check Posterior Tibialis and Dorsalis pulse intact bilaterally: Yes Comments Decreased sensation to monofilament tool bilaterally     Lipid Panel     Component Value Date/Time   CHOL 383 (A) 02/17/2021 0000   TRIG 373 (A) 02/17/2021 0000   HDL 61 02/17/2021 0000   LDLCALC 247 02/17/2021 0000     Assessment & Plan:   1) Controlled type 2 diabetes mellitus with complication, with long-term current use of insulin (HCC)  - Patient has currently uncontrolled symptomatic type 2 DM since 72 years of age.  She presents today with her meter and logs showing inconsistent glucose monitoring with at goal glycemic profile overall.  Her POCT A1c today is 6.3%, unchanged from previous visit.  Analysis of her meter shows 7-day average of 88 with 1 reading; 14-day average of 133 with 3 readings; 30-day average of 134 with 6 readings; 90-day average of 139 with 31 readings.  She did note some mild hypoglycemia at times.   - Recent labs are reviewed.   Her diabetes is complicated by noncompliance/nonadherence, obesity and sedentary life and patient remains at a high risk for more acute and chronic complications of diabetes which include CAD, CVA, CKD, retinopathy, and neuropathy. These are all discussed in detail with the patient.  - Nutritional counseling repeated at each appointment due to patients tendency to fall back in to old habits.  - The patient admits there is a room for improvement in their diet and drink choices. -  Suggestion is made for the patient to avoid simple carbohydrates from their diet including Cakes, Sweet Desserts / Pastries, Ice Cream, Soda (diet and regular), Sweet Tea, Candies, Chips, Cookies,  Sweet Pastries, Store Bought Juices, Alcohol in Excess of 1-2 drinks a day, Artificial Sweeteners, Coffee Creamer, and "Sugar-free" Products. This will help patient to have stable blood glucose profile and potentially avoid unintended weight gain.   - I encouraged  the patient to switch to unprocessed or minimally processed complex starch and increased protein intake (animal or plant source), fruits, and vegetables.   - Patient is advised to stick to a routine mealtimes to eat 3 meals a day and avoid unnecessary snacks (to snack only to correct hypoglycemia).  - I have approached patient with the following individualized plan to manage diabetes and patient agrees:   -she is struggling to achieve control of diabetes, likely deals with moderate cognitive deficit. Avoiding hypoglycemia is the #1 priority in her case.   -She is advised to continue her Ozempic 1 mg SQ weekly and lower her Tresiba to 40 units SQ daily. I did encourage her to increase her dietary fiber intake and water to avoid constipation.  -She does not tolerate Metformin due to GI side effects.  -She is encouraged to start consistently monitoring blood glucose twice daily, before breakfast and before bed, and to call the clinic if she has readings less than 70 or above 300 for 3 tests in a row.  -She is not a good candidate for incretin therapy due to her hypertriglyceridemia increasing her risk of pancreatitis.    2) Lipids/HPL:  Her most recent lipid panel from 04/01/22 shows uncontrolled LDL of 120 and high triglycerides of 333.  She is advised to continue Crestor 10 mg po daily at bedtime and continue Fenofibrate 145 mg po daily.    3) Hypertension: -Her blood pressure is controlled to target.  She is advised to continue Norvasc 5 mg po daily, Lisinopril-HCT 20/12.5 mg po daily and follow up with Dr. Manuella Ghazi regarding her meds.   She is advised to continue follow-up closely with Dr. Manuella Ghazi for primary care needs.     I spent   30  minutes in the care of the patient today including review of labs from [facility], Lipids, Thyroid Function, Hematology (current and previous including abstractions from other facilities); face-to-face time discussing  her blood glucose readings/logs, discussing hypoglycemia and hyperglycemia episodes and symptoms, medications doses, her options of short and long term treatment based on the latest standards of care / guidelines;  discussion about incorporating lifestyle medicine;  and documenting the encounter. Risk reduction counseling performed per USPSTF guidelines to reduce obesity and cardiovascular risk factors.     Please refer to Patient Instructions for Blood Glucose Monitoring and Insulin/Medications Dosing Guide"  in media tab for additional information. Please  also refer to " Patient Self Inventory" in the Media  tab for reviewed elements of pertinent patient history.  Orlena Sheldon participated in the discussions, expressed understanding, and voiced agreement with the above plans.  All questions were answered to her satisfaction. she is encouraged to contact clinic should she have any questions or concerns prior to her return visit.   Follow up plan: Return in about 4 months (around 12/23/2022) for Diabetes F/U with A1c in office, No previsit labs, Bring meter and logs.   Rayetta Pigg, [facility] [address] [place], [place] [address] Phone: [phone] Fax: [phone]

## 2022-09-12 ENCOUNTER — Other Ambulatory Visit: Payer: Self-pay | Admitting: Nurse Practitioner

## 2022-09-20 ENCOUNTER — Ambulatory Visit (INDEPENDENT_AMBULATORY_CARE_PROVIDER_SITE_OTHER): Payer: 59 | Admitting: Internal Medicine

## 2022-09-20 ENCOUNTER — Encounter: Payer: Self-pay | Admitting: Internal Medicine

## 2022-09-20 VITALS — BP 131/81 | HR 101 | Temp 98.0°F | Ht 62.0 in | Wt 170.6 lb

## 2022-09-20 DIAGNOSIS — K219 Gastro-esophageal reflux disease without esophagitis: Secondary | ICD-10-CM

## 2022-09-20 DIAGNOSIS — Z86010 Personal history of colonic polyps: Secondary | ICD-10-CM | POA: Diagnosis not present

## 2022-09-20 DIAGNOSIS — K5909 Other constipation: Secondary | ICD-10-CM

## 2022-09-20 NOTE — Progress Notes (Signed)
Primary Care Physician:  Kirstie Peri, MD Primary Gastroenterologist:  Dr. Jena Gauss  Pre-Procedure History & Physical: HPI:  Carrie Turner is a 72 y.o. female here for follow-up of nausea/gastroparesis.  Since stopping Ozempic chronic nausea subsided.  She states she does have occasional early morning nausea for which she takes a single metoclopramide 5 mg tablet.  Does not take any more than 1 a day and does not do that every day.  Reflux well-controlled on Dexilant 60 mg daily.  No dysphagia.- Bowel function good on Linzess 72.  history of serrated polyp; due for surveillance colonoscopy this year.  Patient wants to wait. Past Medical History:  Diagnosis Date   Anxiety    Arthritis    COPD (chronic obstructive pulmonary disease) (HCC)    Delayed gastric emptying    GES completed on 08/27/2019 revealed borderline delayed gastric emptying; 87% emptying at 4 hours (normal greater than 90%)   Dementia (HCC) 09/30/2019   per patient, early dementia diagnosed after seeing PCP recently   Depression    Diabetes mellitus, type II (HCC)    GERD (gastroesophageal reflux disease)    Glaucoma    Hyperlipidemia    Hypertension    Migraines    Neuropathy    Seizures (HCC)    seizure was from ETOH, "a long time ago", no med and no recurrance   Sleep apnea    Vitamin D deficiency     Past Surgical History:  Procedure Laterality Date   ABDOMINAL HYSTERECTOMY     CATARACT EXTRACTION W/PHACO Left 07/24/2019   Procedure: CATARACT EXTRACTION PHACO AND INTRAOCULAR LENS PLACEMENT LEFT EYE  (CDE: 5.60);  Surgeon: Fabio Pierce, MD;  Location: [facility];  Service: Ophthalmology;  Laterality: Left;   CATARACT EXTRACTION W/PHACO Right 08/19/2019   Procedure: CATARACT EXTRACTION PHACO AND INTRAOCULAR LENS PLACEMENT RIGHT EYE;  Surgeon: Fabio Pierce, MD;  Location: [facility];  Service: Ophthalmology;  Laterality: Right;  CDE: 8.83   CHOLECYSTECTOMY N/A 12/25/2019   Procedure: LAPAROSCOPIC CHOLECYSTECTOMY;   Surgeon: Franky Macho, MD;  Location: [facility];  Service: General;  Laterality: N/A;   COLONOSCOPY  11/2015   Dr. Teena Dunk: sessile polyp removed (benign). advised repeat colonoscopy in 5 years.    COLONOSCOPY WITH PROPOFOL N/A 10/26/2017   RMR: Tubular adenoma removed.  Random colon biopsies negative.  Nonbleeding internal hemorrhoids.  Next colonoscopy in 5 years.   ESOPHAGEAL DILATION  10/26/2017   Procedure: ESOPHAGEAL DILATION;  Surgeon: Corbin Ade, MD;  Location: [facility];  Service: Endoscopy;;   ESOPHAGOGASTRODUODENOSCOPY  11/2015   Dr. Teena Dunk: hiatal hernia   ESOPHAGOGASTRODUODENOSCOPY (EGD) WITH PROPOFOL N/A 10/26/2017   RMR: Erosive reflux esophagitis, hiatal hernia.  Small bowel biopsies negative.   ESOPHAGOGASTRODUODENOSCOPY (EGD) WITH PROPOFOL N/A 04/15/2021   Procedure: ESOPHAGOGASTRODUODENOSCOPY (EGD) WITH PROPOFOL;  Surgeon: Corbin Ade, MD;  Location: [facility];  Service: Endoscopy;  Laterality: N/A;  8:45am - LM to see if pt could move up CY   INSERTION OF ANTERIOR SEGMENT AQUEOUS DRAINAGE DEVICE (ISTENT) Left 07/24/2019   Procedure: INSERTION OF ANTERIOR SEGMENT AQUEOUS DRAINAGE DEVICE (ISTENT) LEFT EYE;  Surgeon: Fabio Pierce, MD;  Location: [facility];  Service: Ophthalmology;  Laterality: Left;   INSERTION OF ANTERIOR SEGMENT AQUEOUS DRAINAGE DEVICE (ISTENT) Right 08/19/2019   Procedure: INSERTION OF ANTERIOR SEGMENT AQUEOUS DRAINAGE DEVICE (ISTENT) RIGHT EYE;  Surgeon: Fabio Pierce, MD;  Location: [facility];  Service: Ophthalmology;  Laterality: Right;   KNEE ARTHROSCOPY Right    MALONEY  DILATION N/A 04/15/2021   Procedure: Elease Hashimoto DILATION;  Surgeon: Corbin Ade, MD;  Location: [facility];  Service: Endoscopy;  Laterality: N/A;   ORIF ANKLE FRACTURE Right     Prior to Admission medications   Medication Sig Start Date End Date Taking? Authorizing Provider  albuterol (VENTOLIN HFA) 108 (90 Base) MCG/ACT inhaler Inhale 1-2 puffs into the lungs every 6 (six)  hours as needed for wheezing or shortness of breath.   Yes [provider]  amLODipine (NORVASC) 5 MG tablet Take 5 mg by mouth daily.    Yes [provider]  aspirin EC 81 MG tablet Take 81 mg by mouth daily.   Yes [provider]  BELSOMRA 15 MG TABS Take 1 tablet by mouth at bedtime as needed. 08/16/21  Yes [provider]  Blood Glucose Monitoring Suppl (ONETOUCH VERIO) w/Device KIT 1 each by Does not apply route in the morning, at noon, in the evening, and at bedtime. 02/17/20  Yes Roma Kayser, MD  Cholecalciferol (VITAMIN D3) 2000 units TABS Take 2,000 Units by mouth daily.   Yes [provider]  dexlansoprazole (DEXILANT) 60 MG capsule TAKE 1 CAPSULE BY MOUTH ONCE DAILY. 05/26/22  Yes Mahon, Courtney L, NP  donepezil (ARICEPT) 10 MG tablet Take 10 mg by mouth daily. 07/19/21  Yes [provider]  DULoxetine (CYMBALTA) 60 MG capsule Take 60 mg by mouth daily. 09/12/22  Yes [provider]  Eszopiclone 3 MG TABS Take 3 mg by mouth at bedtime as needed. 09/13/22  Yes [provider]  fenofibrate (TRICOR) 145 MG tablet Take 1 tablet (145 mg total) by mouth daily. 02/25/21  Yes Dani Gobble, NP  gabapentin (NEURONTIN) 800 MG tablet Take 800 mg by mouth 3 (three) times daily. 03/25/20  Yes [provider]  insulin degludec (TRESIBA FLEXTOUCH) 100 UNIT/ML FlexTouch Pen Inject 40 Units into the skin at bedtime. 09/13/22  Yes Reardon, Alphonzo Lemmings J, NP  Insulin Pen Needle (GLOBAL EASE INJECT PEN NEEDLES) 31G X 5 MM MISC USE UP TO 4 TIMES A DAY AS DIRECTED. 08/12/22  Yes Reardon, Whitney J, NP  Insulin Pen Needle (PEN NEEDLES) 31G X 6 MM MISC 1 each by Does not apply route 4 (four) times daily. 03/07/19  Yes Nida, Denman George, MD  latanoprost (XALATAN) 0.005 % ophthalmic solution SMARTSIG:In Eye(s) 07/27/21  Yes [provider]  LINZESS 72 MCG capsule Take 72 mcg by mouth every morning. As needed 10/27/20  Yes  [provider]  lisinopril-hydrochlorothiazide (PRINZIDE,ZESTORETIC) 20-12.5 MG tablet Take 2 tablets by mouth daily.    Yes [provider]  metoCLOPramide (REGLAN) 5 MG tablet Take 5 mg by mouth in the morning and at bedtime. 01/31/20  Yes [provider]  ondansetron (ZOFRAN) 8 MG tablet Take 1 tablet (8 mg total) by mouth every 8 (eight) hours as needed for nausea or vomiting. 01/02/20  Yes Harper, Kristen S, PA-C  ONETOUCH VERIO test strip USE 1 STRIP TO CHECK GLUCOSE 4 TIMES DAILY. 04/27/22  Yes Reardon, Freddi Starr, NP  potassium chloride (KLOR-CON M) 10 MEQ tablet Take 10 mEq by mouth daily. 07/19/21  Yes [provider]  pravastatin (PRAVACHOL) 40 MG tablet Take 1 tablet (40 mg total) by mouth daily. 02/25/21  Yes Dani Gobble, NP  PREMARIN vaginal cream SMARTSIG:1 Vaginal Every Night 01/24/20  Yes [provider]  pyridOXINE (VITAMIN B-6) 100 MG tablet Take 100 mg by mouth daily.   Yes [provider]  rosuvastatin (CRESTOR) 20 MG tablet Take 20 mg by mouth at bedtime. 09/12/22  Yes [provider]  temazepam (RESTORIL) 30 MG capsule Take 30 mg by mouth at bedtime as needed. 10/27/20  Yes [provider]  traZODone (DESYREL) 50 MG tablet Take 50 mg by mouth at bedtime. 09/12/22  Yes [provider]  venlafaxine XR (EFFEXOR-XR) 150 MG 24 hr capsule Take 150 mg by mouth daily. 10/27/20  Yes [provider]  vitamin B-12 (CYANOCOBALAMIN) 500 MCG tablet Take 500 mcg by mouth daily.   Yes [provider]    Allergies as of 09/20/2022 - Review Complete 09/20/2022  Allergen Reaction Noted   Sulfa antibiotics Other (See Comments) 03/04/2016    Family History  Problem Relation Age of Onset   Hypertension Mother    Colon cancer Mother        diagnosed early 24s   Cerebral aneurysm Mother    Alcohol abuse Father    Pancreatic cancer Sister        deceased in 51s   Colon cancer Sister         diagnosed 68 y/o   Cancer Maternal Grandmother    Cancer Maternal Grandfather    Cancer Paternal Grandmother    Other Paternal Grandfather    Stroke Brother    Other Brother    Breast cancer Neg Hx     Social History   Socioeconomic History   Marital status: Divorced    Spouse name: Not on file   Number of children: Not on file   Years of education: Not on file   Highest education level: Not on file  Occupational History   Not on file  Tobacco Use   Smoking status: Never   Smokeless tobacco: Never  Vaping Use   Vaping Use: Never used  Substance and Sexual Activity   Alcohol use: No   Drug use: No   Sexual activity: Not Currently  Other Topics Concern   Not on file  Social History Narrative   Not on file   Social Determinants of Health   Financial Resource Strain: Not on file  Food Insecurity: Not on file  Transportation Needs: Not on file  Physical Activity: Not on file  Stress: Not on file  Social Connections: Not on file  Intimate Partner Violence: Not on file    Review of Systems: See HPI, otherwise negative ROS  Physical Exam: BP 131/81 (BP Location: Left Arm, Patient Position: Sitting, Cuff Size: Large)   Pulse (!) 101   Temp 98 F (36.7 C) (Oral)   Ht 5\' 2"  (1.575 m)   Wt 170 lb 9.6 oz (77.4 kg)   SpO2 96%   BMI 31.20 kg/m  General:   Alert,  pleasant and cooperative in NAD Neck:  Supple; no masses or thyromegaly. No significant cervical adenopathy. Lungs:  Clear throughout to auscultation.   No wheezes, crackles, or rhonchi. No acute distress. Heart:  Regular rate and rhythm; no murmurs, clicks, rubs,  or gallops. Abdomen: Non-distended, normal bowel sounds.  Soft and nontender without appreciable mass or hepatosplenomegaly.  Pulses:  Normal pulses noted. Extremities:  Without clubbing or edema.  Impression/Plan: 72 year old lady with GERD/gastroparesis.  Secondary nausea and vomiting from Ozempic improved.  Only occasional early morning nausea  for which she takes takes a single metoclopramide.  She feels metoclopramide works better than any other antiemetic agents.  We discussed the risk.  However,  she is on an extremely low dose -  not taken daily as a scheduled medication; only as needed.  Bowel function good on Linzess.  History of serrated adenoma due for colonoscopy now but patient wants to hold off.  Recommendations:  Continue Dexilant 60 mg 30 minutes before breakfast daily.  Discussed the risk of Reglan or metoclopramide.  You are only taking it once in the morning when you occasionally get nauseated.  As long as you are taking 5 mg daily and no more, we will allow you to continue.  Please report any unusual symptoms as discussed Colonoscopy declined at this time.  She wants to wait until later this year.  Will plan to see you back in 6 months.     Notice: This dictation was prepared with Dragon dictation along with smaller phrase technology. Any transcriptional errors that result from this process are unintentional and may not be corrected upon review.

## 2022-09-20 NOTE — Patient Instructions (Addendum)
It was good to see you again today!  Continue Dexilant 60 mg 30 minutes before breakfast daily.  Discussed the risk of Reglan or metoclopramide.  You are only taking it once in the morning when you occasionally get nauseated.  As long as you are taking 5 mg daily and no more, we will allow you to continue.  Please report any unusual symptoms as discussed  You have declined a colonoscopy today.  You would like to wait.  That is fine.  Will plan to see you back in 6 months.

## 2022-10-03 ENCOUNTER — Encounter: Payer: Self-pay | Admitting: *Deleted

## 2022-10-07 DIAGNOSIS — R350 Frequency of micturition: Secondary | ICD-10-CM | POA: Diagnosis not present

## 2022-10-07 DIAGNOSIS — Z299 Encounter for prophylactic measures, unspecified: Secondary | ICD-10-CM | POA: Diagnosis not present

## 2022-10-07 DIAGNOSIS — I10 Essential (primary) hypertension: Secondary | ICD-10-CM | POA: Diagnosis not present

## 2022-10-07 DIAGNOSIS — E1143 Type 2 diabetes mellitus with diabetic autonomic (poly)neuropathy: Secondary | ICD-10-CM | POA: Diagnosis not present

## 2022-10-07 DIAGNOSIS — E1165 Type 2 diabetes mellitus with hyperglycemia: Secondary | ICD-10-CM | POA: Diagnosis not present

## 2022-10-12 ENCOUNTER — Telehealth: Payer: Self-pay

## 2022-10-12 NOTE — Telephone Encounter (Signed)
Pt called stating she is experiencing nausea and diarrhea since taking ozempic. Asked if there is something to replace it.

## 2022-10-12 NOTE — Telephone Encounter (Signed)
No, unfortunately all similar medications will have similar side effects.  She seemed to tolerate the 0.5 mg dose ok, we can reduce her dose back to that and see how she does.  She can use her current 1 mg pen but listen for 37 clicks and stop at that point which would equal the 0.5 mg dose (this will help prevent her wasting any remaining of her 1 mg supply pens).  Most people will have nausea if they do not correct diet habits.  Need to have low fat, high fiber diet.

## 2022-10-13 NOTE — Telephone Encounter (Signed)
Spoke with pt, advised her to be mindful of her diet to eat low fat, high fiber also to decrease her ozempic to the 0.5mg  weekly and instructed her of how to use pen she currently has to obtain that dose per Rayetta Pigg, FNP. Pt voiced understanding and stated she took her dose of 1mg  ozempic yesterday and has not noticed side effects as of yet that she had been experiencing.

## 2022-11-11 DIAGNOSIS — Z0000 Encounter for general adult medical examination without abnormal findings: Secondary | ICD-10-CM | POA: Diagnosis not present

## 2022-11-11 DIAGNOSIS — R2681 Unsteadiness on feet: Secondary | ICD-10-CM | POA: Diagnosis not present

## 2022-11-11 DIAGNOSIS — I10 Essential (primary) hypertension: Secondary | ICD-10-CM | POA: Diagnosis not present

## 2022-11-11 DIAGNOSIS — Z7189 Other specified counseling: Secondary | ICD-10-CM | POA: Diagnosis not present

## 2022-11-11 DIAGNOSIS — J449 Chronic obstructive pulmonary disease, unspecified: Secondary | ICD-10-CM | POA: Diagnosis not present

## 2022-11-11 DIAGNOSIS — Z299 Encounter for prophylactic measures, unspecified: Secondary | ICD-10-CM | POA: Diagnosis not present

## 2022-11-24 DIAGNOSIS — R296 Repeated falls: Secondary | ICD-10-CM | POA: Diagnosis not present

## 2022-11-24 DIAGNOSIS — R2681 Unsteadiness on feet: Secondary | ICD-10-CM | POA: Diagnosis not present

## 2022-11-28 ENCOUNTER — Other Ambulatory Visit: Payer: Self-pay | Admitting: Nurse Practitioner

## 2022-12-01 ENCOUNTER — Other Ambulatory Visit: Payer: Self-pay | Admitting: Gastroenterology

## 2022-12-12 DIAGNOSIS — R2681 Unsteadiness on feet: Secondary | ICD-10-CM | POA: Diagnosis not present

## 2022-12-12 DIAGNOSIS — R296 Repeated falls: Secondary | ICD-10-CM | POA: Diagnosis not present

## 2022-12-15 DIAGNOSIS — Z299 Encounter for prophylactic measures, unspecified: Secondary | ICD-10-CM | POA: Diagnosis not present

## 2022-12-15 DIAGNOSIS — E1165 Type 2 diabetes mellitus with hyperglycemia: Secondary | ICD-10-CM | POA: Diagnosis not present

## 2022-12-15 DIAGNOSIS — E1143 Type 2 diabetes mellitus with diabetic autonomic (poly)neuropathy: Secondary | ICD-10-CM | POA: Diagnosis not present

## 2022-12-15 DIAGNOSIS — I10 Essential (primary) hypertension: Secondary | ICD-10-CM | POA: Diagnosis not present

## 2022-12-15 DIAGNOSIS — M1710 Unilateral primary osteoarthritis, unspecified knee: Secondary | ICD-10-CM | POA: Diagnosis not present

## 2022-12-16 DIAGNOSIS — R296 Repeated falls: Secondary | ICD-10-CM | POA: Diagnosis not present

## 2022-12-16 DIAGNOSIS — R2681 Unsteadiness on feet: Secondary | ICD-10-CM | POA: Diagnosis not present

## 2022-12-22 ENCOUNTER — Other Ambulatory Visit: Payer: Self-pay | Admitting: "Endocrinology

## 2022-12-23 ENCOUNTER — Other Ambulatory Visit: Payer: Self-pay | Admitting: Nurse Practitioner

## 2022-12-27 ENCOUNTER — Encounter: Payer: Self-pay | Admitting: Nurse Practitioner

## 2022-12-27 ENCOUNTER — Ambulatory Visit (INDEPENDENT_AMBULATORY_CARE_PROVIDER_SITE_OTHER): Payer: 59 | Admitting: Nurse Practitioner

## 2022-12-27 VITALS — BP 112/75 | HR 101 | Ht 62.0 in | Wt 166.2 lb

## 2022-12-27 DIAGNOSIS — E782 Mixed hyperlipidemia: Secondary | ICD-10-CM

## 2022-12-27 DIAGNOSIS — Z7985 Long-term (current) use of injectable non-insulin antidiabetic drugs: Secondary | ICD-10-CM

## 2022-12-27 DIAGNOSIS — E559 Vitamin D deficiency, unspecified: Secondary | ICD-10-CM

## 2022-12-27 DIAGNOSIS — E1165 Type 2 diabetes mellitus with hyperglycemia: Secondary | ICD-10-CM

## 2022-12-27 DIAGNOSIS — Z794 Long term (current) use of insulin: Secondary | ICD-10-CM | POA: Diagnosis not present

## 2022-12-27 DIAGNOSIS — I10 Essential (primary) hypertension: Secondary | ICD-10-CM | POA: Diagnosis not present

## 2022-12-27 LAB — POCT GLYCOSYLATED HEMOGLOBIN (HGB A1C): Hemoglobin A1C: 6.1 % — AB (ref 4.0–5.6)

## 2022-12-27 MED ORDER — TRESIBA FLEXTOUCH 100 UNIT/ML ~~LOC~~ SOPN: 30.0000 [IU] | PEN_INJECTOR | Freq: Every day | SUBCUTANEOUS | 3 refills | Status: DC

## 2022-12-27 NOTE — Progress Notes (Signed)
12/27/2022  Endocrinology follow-up note  Subjective:    Patient ID: Carrie Turner, female    DOB: 01-Jul-1951.  She is being seen in follow-up  for management of diabetes requested by Kirstie Peri, MD  Past Medical History:  Diagnosis Date   Anxiety    Arthritis    COPD (chronic obstructive pulmonary disease) (HCC)    Delayed gastric emptying    GES completed on 08/27/2019 revealed borderline delayed gastric emptying; 87% emptying at 4 hours (normal greater than 90%)   Dementia (HCC) 09/30/2019   per patient, early dementia diagnosed after seeing PCP recently   Depression    Diabetes mellitus, type II (HCC)    GERD (gastroesophageal reflux disease)    Glaucoma    Hyperlipidemia    Hypertension    Migraines    Neuropathy    Seizures (HCC)    seizure was from ETOH, "a long time ago", no med and no recurrance   Sleep apnea    Vitamin D deficiency     Past Surgical History:  Procedure Laterality Date   ABDOMINAL HYSTERECTOMY     CATARACT EXTRACTION W/PHACO Left 07/24/2019   Procedure: CATARACT EXTRACTION PHACO AND INTRAOCULAR LENS PLACEMENT LEFT EYE  (CDE: 5.60);  Surgeon: Fabio Pierce, MD;  Location: [facility];  Service: Ophthalmology;  Laterality: Left;   CATARACT EXTRACTION W/PHACO Right 08/19/2019   Procedure: CATARACT EXTRACTION PHACO AND INTRAOCULAR LENS PLACEMENT RIGHT EYE;  Surgeon: Fabio Pierce, MD;  Location: [facility];  Service: Ophthalmology;  Laterality: Right;  CDE: 8.83   CHOLECYSTECTOMY N/A 12/25/2019   Procedure: LAPAROSCOPIC CHOLECYSTECTOMY;  Surgeon: Franky Macho, MD;  Location: [facility];  Service: General;  Laterality: N/A;   COLONOSCOPY  11/2015   Dr. Teena Dunk: sessile polyp removed (benign). advised repeat colonoscopy in 5 years.    COLONOSCOPY WITH PROPOFOL N/A 10/26/2017   RMR: Tubular adenoma removed.  Random colon biopsies negative.  Nonbleeding internal hemorrhoids.  Next colonoscopy in 5 years.   ESOPHAGEAL DILATION  10/26/2017   Procedure: ESOPHAGEAL DILATION;   Surgeon: Corbin Ade, MD;  Location: [facility];  Service: Endoscopy;;   ESOPHAGOGASTRODUODENOSCOPY  11/2015   Dr. Teena Dunk: hiatal hernia   ESOPHAGOGASTRODUODENOSCOPY (EGD) WITH PROPOFOL N/A 10/26/2017   RMR: Erosive reflux esophagitis, hiatal hernia.  Small bowel biopsies negative.   ESOPHAGOGASTRODUODENOSCOPY (EGD) WITH PROPOFOL N/A 04/15/2021   Procedure: ESOPHAGOGASTRODUODENOSCOPY (EGD) WITH PROPOFOL;  Surgeon: Corbin Ade, MD;  Location: [facility];  Service: Endoscopy;  Laterality: N/A;  8:45am - LM to see if pt could move up CY   INSERTION OF ANTERIOR SEGMENT AQUEOUS DRAINAGE DEVICE (ISTENT) Left 07/24/2019   Procedure: INSERTION OF ANTERIOR SEGMENT AQUEOUS DRAINAGE DEVICE (ISTENT) LEFT EYE;  Surgeon: Fabio Pierce, MD;  Location: [facility];  Service: Ophthalmology;  Laterality: Left;   INSERTION OF ANTERIOR SEGMENT AQUEOUS DRAINAGE DEVICE (ISTENT) Right 08/19/2019   Procedure: INSERTION OF ANTERIOR SEGMENT AQUEOUS DRAINAGE DEVICE (ISTENT) RIGHT EYE;  Surgeon: Fabio Pierce, MD;  Location: [facility];  Service: Ophthalmology;  Laterality: Right;   KNEE ARTHROSCOPY Right    MALONEY DILATION N/A 04/15/2021   Procedure: Elease Hashimoto DILATION;  Surgeon: Corbin Ade, MD;  Location: [facility];  Service: Endoscopy;  Laterality: N/A;   ORIF ANKLE FRACTURE Right      Social History   Socioeconomic History   Marital status: Divorced    Spouse name: Not on file   Number of children: Not on file   Years of education: Not on file  Highest education level: Not on file  Occupational History   Not on file  Tobacco Use   Smoking status: Never   Smokeless tobacco: Never  Vaping Use   Vaping Use: Never used  Substance and Sexual Activity   Alcohol use: No   Drug use: No   Sexual activity: Not Currently  Other Topics Concern   Not on file  Social History Narrative   Not on file   Social Determinants of Health   Financial Resource Strain: Not on file  Food Insecurity: Not on file   Transportation Needs: Not on file  Physical Activity: Not on file  Stress: Not on file  Social Connections: Not on file  Intimate Partner Violence: Not on file    Current Outpatient Medications on File Prior to Visit  Medication Sig Dispense Refill   albuterol (VENTOLIN HFA) 108 (90 Base) MCG/ACT inhaler Inhale 1-2 puffs into the lungs every 6 (six) hours as needed for wheezing or shortness of breath.     amLODipine (NORVASC) 5 MG tablet Take 5 mg by mouth daily.      aspirin EC 81 MG tablet Take 81 mg by mouth daily.     BELSOMRA 15 MG TABS Take 1 tablet by mouth at bedtime as needed.     Blood Glucose Monitoring Suppl (ONETOUCH VERIO) w/Device KIT 1 each by Does not apply route in the morning, at noon, in the evening, and at bedtime. 1 kit 0   Cholecalciferol (VITAMIN D3) 2000 units TABS Take 2,000 Units by mouth daily.     dexlansoprazole (DEXILANT) 60 MG capsule Take 1 capsule (60 mg total) by mouth daily. 28 capsule 3   donepezil (ARICEPT) 10 MG tablet Take 10 mg by mouth daily.     DULoxetine (CYMBALTA) 60 MG capsule Take 60 mg by mouth daily.     Eszopiclone 3 MG TABS Take 3 mg by mouth at bedtime as needed.     fenofibrate (TRICOR) 145 MG tablet Take 1 tablet (145 mg total) by mouth daily. 90 tablet 3   gabapentin (NEURONTIN) 800 MG tablet Take 800 mg by mouth 3 (three) times daily.     glucose blood (ONETOUCH VERIO) test strip USE TO CHECK GLUCOSE TWO TIMES DAILY 100 each 0   Insulin Pen Needle (GLOBAL EASE INJECT PEN NEEDLES) 31G X 5 MM MISC USE UP TO 4 TIMES A DAY AS DIRECTED. 100 each 0   Insulin Pen Needle (PEN NEEDLES) 31G X 6 MM MISC 1 each by Does not apply route 4 (four) times daily. 400 each 1   latanoprost (XALATAN) 0.005 % ophthalmic solution SMARTSIG:In Eye(s)     LINZESS 72 MCG capsule Take 72 mcg by mouth every morning. As needed     lisinopril-hydrochlorothiazide (PRINZIDE,ZESTORETIC) 20-12.5 MG tablet Take 2 tablets by mouth daily.      metoCLOPramide (REGLAN) 5  MG tablet Take 5 mg by mouth in the morning and at bedtime.     ondansetron (ZOFRAN) 8 MG tablet Take 1 tablet (8 mg total) by mouth every 8 (eight) hours as needed for nausea or vomiting. 30 tablet 0   potassium chloride (KLOR-CON M) 10 MEQ tablet Take 10 mEq by mouth daily.     pravastatin (PRAVACHOL) 40 MG tablet Take 1 tablet (40 mg total) by mouth daily. 90 tablet 3   PREMARIN vaginal cream SMARTSIG:1 Vaginal Every Night     pyridOXINE (VITAMIN B-6) 100 MG tablet Take 100 mg by mouth daily.  rosuvastatin (CRESTOR) 20 MG tablet Take 20 mg by mouth at bedtime.     Semaglutide, 1 MG/DOSE, (OZEMPIC, 1 MG/DOSE,) 4 MG/3ML SOPN INJECT 1 MG AS DIRECTED ONCE WEEKLY 3 mL 2   temazepam (RESTORIL) 30 MG capsule Take 30 mg by mouth at bedtime as needed.     traZODone (DESYREL) 50 MG tablet Take 50 mg by mouth at bedtime.     venlafaxine XR (EFFEXOR-XR) 150 MG 24 hr capsule Take 150 mg by mouth daily.     vitamin B-12 (CYANOCOBALAMIN) 500 MCG tablet Take 500 mcg by mouth daily.     No current facility-administered medications on file prior to visit.    No facility-administered encounter medications on file as of 10/16/2018.    Allergies  Allergen Reactions   Sulfa Antibiotics Other (See Comments)    Weakness and fatigued      Diabetes She presents for her follow-up diabetic visit. She has type 2 diabetes mellitus. Onset time: diagnosed at approximate age of 56. Her disease course has been improving. There are no hypoglycemic associated symptoms. Pertinent negatives for hypoglycemia include no confusion, headaches, mood changes or seizures. Associated symptoms include blurred vision, fatigue, foot paresthesias and weight loss. Pertinent negatives for diabetes include no chest pain, no polydipsia, no polyphagia and no polyuria. There are no hypoglycemic complications. Symptoms are improving. Diabetic complications include peripheral neuropathy. Risk factors for coronary artery disease include  diabetes mellitus, dyslipidemia, hypertension, obesity, post-menopausal and sedentary lifestyle. Current diabetic treatment includes insulin injections (and Ozempic). She is compliant with treatment most of the time. Her weight is decreasing steadily. She is following a generally healthy diet. When asked about meal planning, she reported none. She has had a previous visit with a dietitian. She rarely participates in exercise. Her home blood glucose trend is decreasing steadily. Her overall blood glucose range is 90-110 mg/dl. (She presents today with her meter and logs showing tightening glycemic profile.  Her POCT A1c today is 6.1%, improving from last visit of 6.3%.  She called between visits for upset stomach and she stopped her Ozempic for a brief period of time until she was better and restarted after glucose readings rebounded.  She reports no further issues.  Analysis of her meter shows 7-day average of 98 (5 readings); 14-day average of 103 (9 readings); 30-day average of 132 (22 readings); 90-day average of 137 (58 readings).) An ACE inhibitor/angiotensin II receptor blocker is being taken. She does not see a podiatrist.Eye exam is current.  Hyperlipidemia This is a chronic problem. The current episode started more than 1 year ago. The problem is resistant. Recent lipid tests were reviewed and are high. Exacerbating diseases include diabetes and obesity. Factors aggravating her hyperlipidemia include thiazides. Pertinent negatives include no chest pain, leg pain, myalgias or shortness of breath. Current antihyperlipidemic treatment includes fibric acid derivatives and statins. The current treatment provides mild improvement of lipids. Compliance problems include adherence to exercise, adherence to diet and psychosocial issues.  Risk factors for coronary artery disease include diabetes mellitus, dyslipidemia, hypertension, obesity, post-menopausal and a sedentary lifestyle.  Hypertension This is a  chronic problem. The current episode started more than 1 year ago. The problem has been waxing and waning since onset. The problem is uncontrolled. Associated symptoms include blurred vision. Pertinent negatives include no chest pain, headaches or shortness of breath. There are no associated agents to hypertension. Risk factors for coronary artery disease include diabetes mellitus, sedentary lifestyle, obesity, dyslipidemia and post-menopausal state. Past treatments include  calcium channel blockers, ACE inhibitors and diuretics. The current treatment provides mild improvement. Compliance problems include diet, exercise and psychosocial issues.      Review of systems  Constitutional: + steadily decreasing body weight,  current Body mass index is 30.4 kg/m. , + fatigue, no subjective hyperthermia, no subjective hypothermia Eyes: no blurry vision, no xerophthalmia ENT: no sore throat, no nodules palpated in throat, no dysphagia/odynophagia, no hoarseness Cardiovascular: no chest pain, no shortness of breath, no palpitations, no leg swelling Respiratory: no cough, no shortness of breath Gastrointestinal: no nausea/vomiting/diarrhea Musculoskeletal: no muscle/joint aches Skin: no rashes, no hyperemia Neurological: no tremors, no numbness, no tingling, no dizziness Psychiatric: no depression, no anxiety   Objective:    BP 112/75 (BP Location: Left Arm, Patient Position: Sitting, Cuff Size: Large)   Pulse (!) 101   Ht 5\' 2"  (1.575 m)   Wt 166 lb 3.2 oz (75.4 kg)   BMI 30.40 kg/m   Wt Readings from Last 3 Encounters:  12/27/22 166 lb 3.2 oz (75.4 kg)  09/20/22 170 lb 9.6 oz (77.4 kg)  08/24/22 168 lb (76.2 kg)   BP Readings from Last 3 Encounters:  12/27/22 112/75  09/20/22 131/81  08/24/22 106/68      Physical Exam- Limited  Constitutional:  Body mass index is 30.4 kg/m. , not in acute distress, normal state of mind Eyes:  EOMI, no exophthalmos Musculoskeletal: no gross  deformities, strength intact in all four extremities, no gross restriction of joint movements Skin:  no rashes, no hyperemia Neurological: no tremor with outstretched hands    Diabetic Foot Exam - Simple   No data filed     Lipid Panel     Component Value Date/Time   CHOL 383 (A) 02/17/2021 0000   TRIG 373 (A) 02/17/2021 0000   HDL 61 02/17/2021 0000   LDLCALC 247 02/17/2021 0000     Assessment & Plan:   1) Controlled type 2 diabetes mellitus with complication, with long-term current use of insulin (HCC)  - Patient has currently uncontrolled symptomatic type 2 DM since 72 years of age.  She presents today with her meter and logs showing tightening glycemic profile.  Her POCT A1c today is 6.1%, improving from last visit of 6.3%.  She called between visits for upset stomach and she stopped her Ozempic for a brief period of time until she was better and restarted after glucose readings rebounded.  She reports no further issues.  Analysis of her meter shows 7-day average of 98 (5 readings); 14-day average of 103 (9 readings); 30-day average of 132 (22 readings); 90-day average of 137 (58 readings).   - Recent labs are reviewed.   Her diabetes is complicated by noncompliance/nonadherence, obesity and sedentary life and patient remains at a high risk for more acute and chronic complications of diabetes which include CAD, CVA, CKD, retinopathy, and neuropathy. These are all discussed in detail with the patient.  - Nutritional counseling repeated at each appointment due to patients tendency to fall back in to old habits.  - The patient admits there is a room for improvement in their diet and drink choices. -  Suggestion is made for the patient to avoid simple carbohydrates from their diet including Cakes, Sweet Desserts / Pastries, Ice Cream, Soda (diet and regular), Sweet Tea, Candies, Chips, Cookies, Sweet Pastries, Store Bought Juices, Alcohol in Excess of 1-2 drinks a day, Artificial  Sweeteners, Coffee Creamer, and "Sugar-free" Products. This will help patient to have stable blood glucose  profile and potentially avoid unintended weight gain.   - I encouraged the patient to switch to unprocessed or minimally processed complex starch and increased protein intake (animal or plant source), fruits, and vegetables.   - Patient is advised to stick to a routine mealtimes to eat 3 meals a day and avoid unnecessary snacks (to snack only to correct hypoglycemia).  - I have approached patient with the following individualized plan to manage diabetes and patient agrees:   -she is struggling to achieve control of diabetes, likely deals with moderate cognitive deficit. Avoiding hypoglycemia is the #1 priority in her case.   -She is advised to continue her Ozempic 1 mg SQ weekly and lower her Tresiba to 30 units SQ daily. I did encourage her to increase her dietary fiber intake and water to avoid constipation.  -She does not tolerate Metformin due to GI side effects.  -She is encouraged to start consistently monitoring blood glucose twice daily, before breakfast and before bed, and to call the clinic if she has readings less than 70 or above 300 for 3 tests in a row.  -She is not a good candidate for incretin therapy due to her hypertriglyceridemia increasing her risk of pancreatitis.    2) Lipids/HPL:  Her most recent lipid panel from 04/01/22 shows uncontrolled LDL of 120 and high triglycerides of 333.  She is advised to continue Crestor 10 mg po daily at bedtime and continue Fenofibrate 145 mg po daily.    3) Hypertension: -Her blood pressure is controlled to target.  She is advised to continue Norvasc 5 mg po daily, Lisinopril-HCT 20/12.5 mg po daily and follow up with Dr. Sherryll Burger regarding her meds.   She is advised to continue follow-up closely with Dr. Sherryll Burger for primary care needs.      I spent  44  minutes in the care of the patient today including review of labs from CMP,  Lipids, Thyroid Function, Hematology (current and previous including abstractions from other facilities); face-to-face time discussing  her blood glucose readings/logs, discussing hypoglycemia and hyperglycemia episodes and symptoms, medications doses, her options of short and long term treatment based on the latest standards of care / guidelines;  discussion about incorporating lifestyle medicine;  and documenting the encounter. Risk reduction counseling performed per USPSTF guidelines to reduce obesity and cardiovascular risk factors.     Please refer to Patient Instructions for Blood Glucose Monitoring and Insulin/Medications Dosing Guide"  in media tab for additional information. Please  also refer to " Patient Self Inventory" in the Media  tab for reviewed elements of pertinent patient history.  Dennie Bible participated in the discussions, expressed understanding, and voiced agreement with the above plans.  All questions were answered to her satisfaction. she is encouraged to contact clinic should she have any questions or concerns prior to her return visit.   Follow up plan: Return in about 4 months (around 04/28/2023) for Diabetes F/U with A1c in office, No previsit labs, Bring meter and logs.   Ronny Bacon, [facility] [address] [place], [place] [address] Phone: [phone] Fax: [phone]

## 2022-12-31 DIAGNOSIS — G4733 Obstructive sleep apnea (adult) (pediatric): Secondary | ICD-10-CM | POA: Diagnosis not present

## 2023-01-10 DIAGNOSIS — G4733 Obstructive sleep apnea (adult) (pediatric): Secondary | ICD-10-CM | POA: Diagnosis not present

## 2023-01-10 DIAGNOSIS — Z299 Encounter for prophylactic measures, unspecified: Secondary | ICD-10-CM | POA: Diagnosis not present

## 2023-01-10 DIAGNOSIS — I10 Essential (primary) hypertension: Secondary | ICD-10-CM | POA: Diagnosis not present

## 2023-01-19 ENCOUNTER — Telehealth: Payer: Self-pay | Admitting: Nurse Practitioner

## 2023-01-19 ENCOUNTER — Other Ambulatory Visit (HOSPITAL_COMMUNITY): Payer: Self-pay

## 2023-01-19 NOTE — Telephone Encounter (Signed)
Per test claim: PA is not required. Copay is $0

## 2023-01-19 NOTE — Telephone Encounter (Signed)
Pt is stating they need a PA for [place].  Have you seen this for the patient?

## 2023-01-20 NOTE — Telephone Encounter (Signed)
See above, PA was not required

## 2023-01-23 NOTE — Telephone Encounter (Signed)
Called pt and left a message, to call the office back to explain.

## 2023-01-30 DIAGNOSIS — I739 Peripheral vascular disease, unspecified: Secondary | ICD-10-CM | POA: Diagnosis not present

## 2023-01-30 DIAGNOSIS — G4733 Obstructive sleep apnea (adult) (pediatric): Secondary | ICD-10-CM | POA: Diagnosis not present

## 2023-01-30 DIAGNOSIS — Z299 Encounter for prophylactic measures, unspecified: Secondary | ICD-10-CM | POA: Diagnosis not present

## 2023-01-30 DIAGNOSIS — I10 Essential (primary) hypertension: Secondary | ICD-10-CM | POA: Diagnosis not present

## 2023-02-07 ENCOUNTER — Encounter: Payer: Self-pay | Admitting: Internal Medicine

## 2023-02-15 DIAGNOSIS — E1165 Type 2 diabetes mellitus with hyperglycemia: Secondary | ICD-10-CM | POA: Diagnosis not present

## 2023-02-15 DIAGNOSIS — Z0000 Encounter for general adult medical examination without abnormal findings: Secondary | ICD-10-CM | POA: Diagnosis not present

## 2023-02-15 DIAGNOSIS — E1143 Type 2 diabetes mellitus with diabetic autonomic (poly)neuropathy: Secondary | ICD-10-CM | POA: Diagnosis not present

## 2023-02-15 DIAGNOSIS — I739 Peripheral vascular disease, unspecified: Secondary | ICD-10-CM | POA: Diagnosis not present

## 2023-02-15 DIAGNOSIS — I10 Essential (primary) hypertension: Secondary | ICD-10-CM | POA: Diagnosis not present

## 2023-02-15 DIAGNOSIS — Z299 Encounter for prophylactic measures, unspecified: Secondary | ICD-10-CM | POA: Diagnosis not present

## 2023-02-21 DIAGNOSIS — G4700 Insomnia, unspecified: Secondary | ICD-10-CM | POA: Diagnosis not present

## 2023-02-21 DIAGNOSIS — Z299 Encounter for prophylactic measures, unspecified: Secondary | ICD-10-CM | POA: Diagnosis not present

## 2023-02-21 DIAGNOSIS — B372 Candidiasis of skin and nail: Secondary | ICD-10-CM | POA: Diagnosis not present

## 2023-02-21 DIAGNOSIS — I10 Essential (primary) hypertension: Secondary | ICD-10-CM | POA: Diagnosis not present

## 2023-03-02 DIAGNOSIS — G4733 Obstructive sleep apnea (adult) (pediatric): Secondary | ICD-10-CM | POA: Diagnosis not present

## 2023-03-15 DIAGNOSIS — Z299 Encounter for prophylactic measures, unspecified: Secondary | ICD-10-CM | POA: Diagnosis not present

## 2023-03-15 DIAGNOSIS — R300 Dysuria: Secondary | ICD-10-CM | POA: Diagnosis not present

## 2023-03-15 DIAGNOSIS — G4700 Insomnia, unspecified: Secondary | ICD-10-CM | POA: Diagnosis not present

## 2023-03-15 DIAGNOSIS — G4733 Obstructive sleep apnea (adult) (pediatric): Secondary | ICD-10-CM | POA: Diagnosis not present

## 2023-03-15 DIAGNOSIS — I10 Essential (primary) hypertension: Secondary | ICD-10-CM | POA: Diagnosis not present

## 2023-03-15 DIAGNOSIS — N390 Urinary tract infection, site not specified: Secondary | ICD-10-CM | POA: Diagnosis not present

## 2023-03-16 ENCOUNTER — Other Ambulatory Visit: Payer: Self-pay | Admitting: Nurse Practitioner

## 2023-03-21 ENCOUNTER — Other Ambulatory Visit: Payer: Self-pay | Admitting: Internal Medicine

## 2023-04-04 ENCOUNTER — Encounter: Payer: Self-pay | Admitting: Internal Medicine

## 2023-04-04 ENCOUNTER — Ambulatory Visit (INDEPENDENT_AMBULATORY_CARE_PROVIDER_SITE_OTHER): Payer: 59 | Admitting: Internal Medicine

## 2023-04-04 VITALS — BP 138/82 | HR 102 | Temp 98.8°F | Ht 62.0 in | Wt 167.0 lb

## 2023-04-04 DIAGNOSIS — K219 Gastro-esophageal reflux disease without esophagitis: Secondary | ICD-10-CM | POA: Diagnosis not present

## 2023-04-04 DIAGNOSIS — Z86010 Personal history of colonic polyps: Secondary | ICD-10-CM | POA: Diagnosis not present

## 2023-04-04 DIAGNOSIS — R110 Nausea: Secondary | ICD-10-CM | POA: Diagnosis not present

## 2023-04-04 DIAGNOSIS — K5909 Other constipation: Secondary | ICD-10-CM | POA: Diagnosis not present

## 2023-04-04 MED ORDER — ONDANSETRON 4 MG PO TBDP: 4.0000 mg | ORAL_TABLET | Freq: Three times a day (TID) | ORAL | 11 refills | Status: AC | PRN

## 2023-04-04 MED ORDER — LINACLOTIDE 72 MCG PO CAPS: 72.0000 ug | ORAL_CAPSULE | Freq: Every day | ORAL | 11 refills | Status: AC

## 2023-04-04 NOTE — Patient Instructions (Signed)
It was very nice seeing you again today!  As discussed you are due to have a colonoscopy now given your history of polyps.  We will plan to set that up in the near future ASA 3.  Diabetic medicines per protocol.  Because you have recurrent difficulty swallowing, we will also set up an EGD with esophageal dilation at the same time as your colonoscopy.  ASA 3.  You will need an extra half day of clear liquids the preparation due to your "lazy stomach".  For better bowel function I would like you to resume Linzess 72 gelcaps take 1 daily as needed to facilitate bowel function.  Dispense 30 with 11 refills.  For nausea, I would like you to stop the metoclopramide or Reglan completely  New prescription for Zofran sublingual tablets.  Dispense 30 with 11 refills.  May take 1 every 8 hours as needed for nausea.

## 2023-04-04 NOTE — Progress Notes (Signed)
Primary Care Physician:  Kirstie Peri, MD Primary Gastroenterologist:  Dr. Jena Gauss  Pre-Procedure History & Physical: HPI:  Carrie Turner is a 72 y.o. female here for follow-up of nausea history mild gastroparesis.  Takes Reglan 5 mg once daily in the morning.  Cannot tell much difference with Reglan no apparent side effects.  Reflux well-controlled on Dexilant 60 mg daily.  History of serrated polyp removed 2019; due for surveillance examination now.  Bowel function is reported to be good on Linzess 72 daily.  Ran out of meds recently with recurrent symptoms. Patient now reports recurrent esophageal dysphagia to solids.  Again, typical  reflux symptoms well-controlled.  Significant improvement in nausea since Ozempic was discontinued however she does have some persisting symptoms.  Past Medical History:  Diagnosis Date   Anxiety    Arthritis    COPD (chronic obstructive pulmonary disease) (HCC)    Delayed gastric emptying    GES completed on 08/27/2019 revealed borderline delayed gastric emptying; 87% emptying at 4 hours (normal greater than 90%)   Dementia (HCC) 09/30/2019   per patient, early dementia diagnosed after seeing PCP recently   Depression    Diabetes mellitus, type II (HCC)    GERD (gastroesophageal reflux disease)    Glaucoma    Hyperlipidemia    Hypertension    Migraines    Neuropathy    Seizures (HCC)    seizure was from ETOH, "a long time ago", no med and no recurrance   Sleep apnea    Vitamin D deficiency     Past Surgical History:  Procedure Laterality Date   ABDOMINAL HYSTERECTOMY     CATARACT EXTRACTION W/PHACO Left 07/24/2019   Procedure: CATARACT EXTRACTION PHACO AND INTRAOCULAR LENS PLACEMENT LEFT EYE  (CDE: 5.60);  Surgeon: Fabio Pierce, MD;  Location: [facility];  Service: Ophthalmology;  Laterality: Left;   CATARACT EXTRACTION W/PHACO Right 08/19/2019   Procedure: CATARACT EXTRACTION PHACO AND INTRAOCULAR LENS PLACEMENT RIGHT EYE;  Surgeon: Fabio Pierce, MD;  Location: [facility];  Service: Ophthalmology;  Laterality: Right;  CDE: 8.83   CHOLECYSTECTOMY N/A 12/25/2019   Procedure: LAPAROSCOPIC CHOLECYSTECTOMY;  Surgeon: Franky Macho, MD;  Location: [facility];  Service: General;  Laterality: N/A;   COLONOSCOPY  11/2015   Dr. Teena Dunk: sessile polyp removed (benign). advised repeat colonoscopy in 5 years.    COLONOSCOPY WITH PROPOFOL N/A 10/26/2017   RMR: Tubular adenoma removed.  Random colon biopsies negative.  Nonbleeding internal hemorrhoids.  Next colonoscopy in 5 years.   ESOPHAGEAL DILATION  10/26/2017   Procedure: ESOPHAGEAL DILATION;  Surgeon: Corbin Ade, MD;  Location: [facility];  Service: Endoscopy;;   ESOPHAGOGASTRODUODENOSCOPY  11/2015   Dr. Teena Dunk: hiatal hernia   ESOPHAGOGASTRODUODENOSCOPY (EGD) WITH PROPOFOL N/A 10/26/2017   RMR: Erosive reflux esophagitis, hiatal hernia.  Small bowel biopsies negative.   ESOPHAGOGASTRODUODENOSCOPY (EGD) WITH PROPOFOL N/A 04/15/2021   Procedure: ESOPHAGOGASTRODUODENOSCOPY (EGD) WITH PROPOFOL;  Surgeon: Corbin Ade, MD;  Location: [facility];  Service: Endoscopy;  Laterality: N/A;  8:45am - LM to see if pt could move up CY   INSERTION OF ANTERIOR SEGMENT AQUEOUS DRAINAGE DEVICE (ISTENT) Left 07/24/2019   Procedure: INSERTION OF ANTERIOR SEGMENT AQUEOUS DRAINAGE DEVICE (ISTENT) LEFT EYE;  Surgeon: Fabio Pierce, MD;  Location: [facility];  Service: Ophthalmology;  Laterality: Left;   INSERTION OF ANTERIOR SEGMENT AQUEOUS DRAINAGE DEVICE (ISTENT) Right 08/19/2019   Procedure: INSERTION OF ANTERIOR SEGMENT AQUEOUS DRAINAGE DEVICE (ISTENT) RIGHT EYE;  Surgeon: Fabio Pierce,  MD;  Location: [facility];  Service: Ophthalmology;  Laterality: Right;   KNEE ARTHROSCOPY Right    MALONEY DILATION N/A 04/15/2021   Procedure: Elease Hashimoto DILATION;  Surgeon: Corbin Ade, MD;  Location: [facility];  Service: Endoscopy;  Laterality: N/A;   ORIF ANKLE FRACTURE Right     Prior to Admission medications    Medication Sig Start Date End Date Taking? Authorizing Provider  albuterol (VENTOLIN HFA) 108 (90 Base) MCG/ACT inhaler Inhale 1-2 puffs into the lungs every 6 (six) hours as needed for wheezing or shortness of breath.   Yes [provider]  amLODipine (NORVASC) 5 MG tablet Take 5 mg by mouth daily.    Yes [provider]  aspirin EC 81 MG tablet Take 81 mg by mouth daily.   Yes [provider]  Blood Glucose Monitoring Suppl (ONETOUCH VERIO) w/Device KIT 1 each by Does not apply route in the morning, at noon, in the evening, and at bedtime. 02/17/20  Yes Roma Kayser, MD  Cholecalciferol (VITAMIN D3) 2000 units TABS Take 2,000 Units by mouth daily.   Yes [provider]  dexlansoprazole (DEXILANT) 60 MG capsule Take 1 capsule (60 mg total) by mouth daily. 03/22/23  Yes Jonia Oakey, Gerrit Friends, MD  donepezil (ARICEPT) 10 MG tablet Take 10 mg by mouth daily. 07/19/21  Yes [provider]  DULoxetine (CYMBALTA) 60 MG capsule Take 60 mg by mouth daily. 09/12/22  Yes [provider]  gabapentin (NEURONTIN) 800 MG tablet Take 800 mg by mouth 3 (three) times daily. 03/25/20  Yes [provider]  glucose blood (ONETOUCH VERIO) test strip USE TO CHECK GLUCOSE TWO TIMES DAILY 12/22/22  Yes Reardon, Alphonzo Lemmings J, NP  insulin degludec (TRESIBA FLEXTOUCH) 100 UNIT/ML FlexTouch Pen Inject 30 Units into the skin at bedtime. 12/27/22  Yes Reardon, Alphonzo Lemmings J, NP  Insulin Pen Needle (GLOBAL EASE INJECT PEN NEEDLES) 31G X 5 MM MISC USE UP TO 4 TIMES A DAY AS DIRECTED. 08/12/22  Yes Reardon, Whitney J, NP  Insulin Pen Needle (PEN NEEDLES) 31G X 6 MM MISC 1 each by Does not apply route 4 (four) times daily. 03/07/19  Yes Nida, Denman George, MD  lisinopril-hydrochlorothiazide (PRINZIDE,ZESTORETIC) 20-12.5 MG tablet Take 2 tablets by mouth daily.    Yes [provider]  metoCLOPramide (REGLAN) 5 MG tablet Take 5 mg by mouth in the morning and at bedtime.  01/31/20  Yes [provider]  potassium chloride (KLOR-CON M) 10 MEQ tablet Take 10 mEq by mouth daily. 07/19/21  Yes [provider]  rosuvastatin (CRESTOR) 20 MG tablet Take 20 mg by mouth at bedtime. 09/12/22  Yes [provider]  Semaglutide, 1 MG/DOSE, (OZEMPIC, 1 MG/DOSE,) 4 MG/3ML SOPN inject 1 MILLIGRAM as directed once weekly 03/16/23  Yes Reardon, Alphonzo Lemmings J, NP  sertraline (ZOLOFT) 100 MG tablet Take 100 mg by mouth 2 (two) times daily. 03/21/23  Yes [provider]  traZODone (DESYREL) 50 MG tablet Take 50 mg by mouth at bedtime. 09/12/22  Yes [provider]  vitamin B-12 (CYANOCOBALAMIN) 500 MCG tablet Take 500 mcg by mouth daily.   Yes [provider]  zolpidem (AMBIEN) 10 MG tablet Take 10 mg by mouth at bedtime as needed. 03/27/23  Yes [provider]    Allergies as of 04/04/2023 - Review Complete 04/04/2023  Allergen Reaction Noted   Sulfa antibiotics Other (See Comments) 03/04/2016    Family History  Problem Relation Age of Onset   Hypertension  Mother    Colon cancer Mother        diagnosed early 48s   Cerebral aneurysm Mother    Alcohol abuse Father    Pancreatic cancer Sister        deceased in 7s   Colon cancer Sister        diagnosed 32 y/o   Cancer Maternal Grandmother    Cancer Maternal Grandfather    Cancer Paternal Grandmother    Other Paternal Grandfather    Stroke Brother    Other Brother    Breast cancer Neg Hx     Social History   Socioeconomic History   Marital status: Divorced    Spouse name: Not on file   Number of children: Not on file   Years of education: Not on file   Highest education level: Not on file  Occupational History   Not on file  Tobacco Use   Smoking status: Never   Smokeless tobacco: Never  Vaping Use   Vaping status: Never Used  Substance and Sexual Activity   Alcohol use: No   Drug use: No   Sexual activity: Not Currently  Other Topics Concern   Not on  file  Social History Narrative   Not on file   Social Determinants of Health   Financial Resource Strain: Not on file  Food Insecurity: Not on file  Transportation Needs: Not on file  Physical Activity: Not on file  Stress: Not on file  Social Connections: Socially Isolated (04/29/2021)   Received from [facility], [facility]   Social Connection and Isolation Panel [NHANES]    Frequency of Communication with Friends and Family: More than three times a week    Frequency of Social Gatherings with Friends and Family: Once a week    Attends Religious Services: Never    Database administrator or Organizations: No    Attends Engineer, structural: Never    Marital Status: Divorced  Catering manager Violence: Not on file    Review of Systems: See HPI, otherwise negative ROS  Physical Exam: BP 138/82 (BP Location: Right Arm, Patient Position: Sitting, Cuff Size: Normal)   Pulse (!) 102   Temp 98.8 F (37.1 C) (Oral)   Ht 5\' 2"  (1.575 m)   Wt 167 lb (75.8 kg)   SpO2 96%   BMI 30.54 kg/m  General:   Alert,  Well-developed, well-nourished, pleasant and cooperative in NAD Abdomen: Non-distended, normal bowel sounds.  Soft and nontender without appreciable mass or hepatosplenomegaly.   Impression/Plan: 72 year old lady with gastroparesis, GERD constipation history of serrated polyp here for follow-up.  Vague early morning nausea unchanged with low-dose Reglan.  GERD well-controlled.  Recurrent esophageal dysphagia.  Recommendations:  We will plan to set that up in the near future ASA 3.  Diabetic medicines per protocol.  Because of recurrent dysphagia, offered an EGD with esophageal dilation at the same time as  colonoscopy.  ASA 3  The risks, benefits, limitations, imponderables and alternatives regarding both EGD and colonoscopy have been reviewed with the patient. Questions have been answered. All parties agreeable.    resume Linzess 72 gelcaps take 1 daily as  needed to facilitate bowel function.  Dispense 30 with 11 refills.  For nausea,  stop the metoclopramide or Reglan completely  New prescription for Zofran sublingual tablets.  Dispense 30 with 11 refills.     Notice: This dictation was prepared with Dragon dictation along with smaller phrase technology. Any transcriptional  errors that result from this process are unintentional and may not be corrected upon review.

## 2023-04-10 ENCOUNTER — Telehealth: Payer: Self-pay | Admitting: *Deleted

## 2023-04-10 NOTE — Telephone Encounter (Signed)
LMOVM to call back to schedule TCS/EGD/ED with propofol ASA 3 with Dr. Jena Gauss, no ozempic x 1 week, HALF dose tresiba night prior

## 2023-04-13 NOTE — Telephone Encounter (Signed)
LMOVM to call back. Letter mailed. ?

## 2023-04-14 ENCOUNTER — Encounter: Payer: Self-pay | Admitting: *Deleted

## 2023-04-14 ENCOUNTER — Other Ambulatory Visit: Payer: Self-pay | Admitting: *Deleted

## 2023-04-14 MED ORDER — PEG 3350-KCL-NA BICARB-NACL 420 G PO SOLR: 4000.0000 mL | Freq: Once | ORAL | 0 refills | Status: AC

## 2023-04-14 NOTE — Telephone Encounter (Signed)
Pt has been scheduled for 06/07/23, instructions mailed and prep sent to the pharmacy.   [employer] PA:  Notification or Prior Authorization is not required for the requested services You are not required to submit a notification/prior authorization based on the information provided. If you have general questions about the prior authorization requirements, visit [email or web address] > Clinician Resources > Advance and Admission Notification Requirements. The number above acknowledges your notification. Please write this reference number down for future reference. If you would like to request an organization determination, please call us at [phone]. Decision ID #: [id number]

## 2023-04-17 ENCOUNTER — Encounter: Payer: Self-pay | Admitting: *Deleted

## 2023-05-01 ENCOUNTER — Ambulatory Visit (INDEPENDENT_AMBULATORY_CARE_PROVIDER_SITE_OTHER): Payer: 59 | Admitting: Nurse Practitioner

## 2023-05-01 ENCOUNTER — Encounter: Payer: Self-pay | Admitting: Nurse Practitioner

## 2023-05-01 VITALS — BP 113/74 | HR 92 | Ht 62.0 in | Wt 165.2 lb

## 2023-05-01 DIAGNOSIS — E782 Mixed hyperlipidemia: Secondary | ICD-10-CM

## 2023-05-01 DIAGNOSIS — E559 Vitamin D deficiency, unspecified: Secondary | ICD-10-CM | POA: Diagnosis not present

## 2023-05-01 DIAGNOSIS — I10 Essential (primary) hypertension: Secondary | ICD-10-CM | POA: Diagnosis not present

## 2023-05-01 DIAGNOSIS — E1165 Type 2 diabetes mellitus with hyperglycemia: Secondary | ICD-10-CM | POA: Diagnosis not present

## 2023-05-01 DIAGNOSIS — Z794 Long term (current) use of insulin: Secondary | ICD-10-CM

## 2023-05-01 DIAGNOSIS — Z7985 Long-term (current) use of injectable non-insulin antidiabetic drugs: Secondary | ICD-10-CM

## 2023-05-01 LAB — POCT UA - MICROALBUMIN: Microalbumin Ur, POC: 30 mg/L

## 2023-05-01 LAB — POCT GLYCOSYLATED HEMOGLOBIN (HGB A1C): Hemoglobin A1C: 6.2 % — AB (ref 4.0–5.6)

## 2023-05-01 MED ORDER — TRESIBA FLEXTOUCH 100 UNIT/ML ~~LOC~~ SOPN: 30.0000 [IU] | PEN_INJECTOR | Freq: Every day | SUBCUTANEOUS | 3 refills | Status: DC

## 2023-05-01 NOTE — Progress Notes (Signed)
05/01/2023  Endocrinology follow-up note  Subjective:    Patient ID: Carrie Turner, female    DOB: 08-03-1950.  She is being seen in follow-up  for management of diabetes requested by Kirstie Peri, MD  Past Medical History:  Diagnosis Date   Anxiety    Arthritis    COPD (chronic obstructive pulmonary disease) (HCC)    Delayed gastric emptying    GES completed on 08/27/2019 revealed borderline delayed gastric emptying; 87% emptying at 4 hours (normal greater than 90%)   Dementia (HCC) 09/30/2019   per patient, early dementia diagnosed after seeing PCP recently   Depression    Diabetes mellitus, type II (HCC)    GERD (gastroesophageal reflux disease)    Glaucoma    Hyperlipidemia    Hypertension    Migraines    Neuropathy    Seizures (HCC)    seizure was from ETOH, "a long time ago", no med and no recurrance   Sleep apnea    Vitamin D deficiency     Past Surgical History:  Procedure Laterality Date   ABDOMINAL HYSTERECTOMY     CATARACT EXTRACTION W/PHACO Left 07/24/2019   Procedure: CATARACT EXTRACTION PHACO AND INTRAOCULAR LENS PLACEMENT LEFT EYE  (CDE: 5.60);  Surgeon: Fabio Pierce, MD;  Location: [facility];  Service: Ophthalmology;  Laterality: Left;   CATARACT EXTRACTION W/PHACO Right 08/19/2019   Procedure: CATARACT EXTRACTION PHACO AND INTRAOCULAR LENS PLACEMENT RIGHT EYE;  Surgeon: Fabio Pierce, MD;  Location: [facility];  Service: Ophthalmology;  Laterality: Right;  CDE: 8.83   CHOLECYSTECTOMY N/A 12/25/2019   Procedure: LAPAROSCOPIC CHOLECYSTECTOMY;  Surgeon: Franky Macho, MD;  Location: [facility];  Service: General;  Laterality: N/A;   COLONOSCOPY  11/2015   Dr. Teena Dunk: sessile polyp removed (benign). advised repeat colonoscopy in 5 years.    COLONOSCOPY WITH PROPOFOL N/A 10/26/2017   RMR: Tubular adenoma removed.  Random colon biopsies negative.  Nonbleeding internal hemorrhoids.  Next colonoscopy in 5 years.   ESOPHAGEAL DILATION  10/26/2017   Procedure: ESOPHAGEAL DILATION;   Surgeon: Corbin Ade, MD;  Location: [facility];  Service: Endoscopy;;   ESOPHAGOGASTRODUODENOSCOPY  11/2015   Dr. Teena Dunk: hiatal hernia   ESOPHAGOGASTRODUODENOSCOPY (EGD) WITH PROPOFOL N/A 10/26/2017   RMR: Erosive reflux esophagitis, hiatal hernia.  Small bowel biopsies negative.   ESOPHAGOGASTRODUODENOSCOPY (EGD) WITH PROPOFOL N/A 04/15/2021   Procedure: ESOPHAGOGASTRODUODENOSCOPY (EGD) WITH PROPOFOL;  Surgeon: Corbin Ade, MD;  Location: [facility];  Service: Endoscopy;  Laterality: N/A;  8:45am - LM to see if pt could move up CY   INSERTION OF ANTERIOR SEGMENT AQUEOUS DRAINAGE DEVICE (ISTENT) Left 07/24/2019   Procedure: INSERTION OF ANTERIOR SEGMENT AQUEOUS DRAINAGE DEVICE (ISTENT) LEFT EYE;  Surgeon: Fabio Pierce, MD;  Location: [facility];  Service: Ophthalmology;  Laterality: Left;   INSERTION OF ANTERIOR SEGMENT AQUEOUS DRAINAGE DEVICE (ISTENT) Right 08/19/2019   Procedure: INSERTION OF ANTERIOR SEGMENT AQUEOUS DRAINAGE DEVICE (ISTENT) RIGHT EYE;  Surgeon: Fabio Pierce, MD;  Location: [facility];  Service: Ophthalmology;  Laterality: Right;   KNEE ARTHROSCOPY Right    MALONEY DILATION N/A 04/15/2021   Procedure: Elease Hashimoto DILATION;  Surgeon: Corbin Ade, MD;  Location: [facility];  Service: Endoscopy;  Laterality: N/A;   ORIF ANKLE FRACTURE Right      Social History   Socioeconomic History   Marital status: Divorced    Spouse name: Not on file   Number of children: Not on file   Years of education: Not on file  Highest education level: Not on file  Occupational History   Not on file  Tobacco Use   Smoking status: Never   Smokeless tobacco: Never  Vaping Use   Vaping status: Never Used  Substance and Sexual Activity   Alcohol use: No   Drug use: No   Sexual activity: Not Currently  Other Topics Concern   Not on file  Social History Narrative   Not on file   Social Determinants of Health   Financial Resource Strain: Not on file  Food Insecurity: Not on  file  Transportation Needs: Not on file  Physical Activity: Not on file  Stress: Not on file  Social Connections: Socially Isolated (04/29/2021)   Received from [facility], [facility]   Social Connection and Isolation Panel [NHANES]    Frequency of Communication with Friends and Family: More than three times a week    Frequency of Social Gatherings with Friends and Family: Once a week    Attends Religious Services: Never    Database administrator or Organizations: No    Attends Engineer, structural: Never    Marital Status: Divorced  Catering manager Violence: Not on file    Current Outpatient Medications on File Prior to Visit  Medication Sig Dispense Refill   albuterol (VENTOLIN HFA) 108 (90 Base) MCG/ACT inhaler Inhale 1-2 puffs into the lungs every 6 (six) hours as needed for wheezing or shortness of breath.     amLODipine (NORVASC) 5 MG tablet Take 5 mg by mouth daily.      aspirin EC 81 MG tablet Take 81 mg by mouth daily.     Blood Glucose Monitoring Suppl (ONETOUCH VERIO) w/Device KIT 1 each by Does not apply route in the morning, at noon, in the evening, and at bedtime. 1 kit 0   Cholecalciferol (VITAMIN D3) 2000 units TABS Take 2,000 Units by mouth daily.     dexlansoprazole (DEXILANT) 60 MG capsule Take 1 capsule (60 mg total) by mouth daily. 30 capsule 3   donepezil (ARICEPT) 10 MG tablet Take 10 mg by mouth daily.     DULoxetine (CYMBALTA) 60 MG capsule Take 60 mg by mouth daily.     gabapentin (NEURONTIN) 800 MG tablet Take 800 mg by mouth 3 (three) times daily.     glucose blood (ONETOUCH VERIO) test strip USE TO CHECK GLUCOSE TWO TIMES DAILY 100 each 0   Insulin Pen Needle (GLOBAL EASE INJECT PEN NEEDLES) 31G X 5 MM MISC USE UP TO 4 TIMES A DAY AS DIRECTED. 100 each 0   Insulin Pen Needle (PEN NEEDLES) 31G X 6 MM MISC 1 each by Does not apply route 4 (four) times daily. 400 each 1   linaclotide (LINZESS) 72 MCG capsule Take 1 capsule (72 mcg total) by  mouth daily before breakfast. 30 capsule 11   lisinopril-hydrochlorothiazide (PRINZIDE,ZESTORETIC) 20-12.5 MG tablet Take 2 tablets by mouth daily.      metoCLOPramide (REGLAN) 5 MG tablet Take 5 mg by mouth in the morning and at bedtime.     ondansetron (ZOFRAN-ODT) 4 MG disintegrating tablet Take 1 tablet (4 mg total) by mouth every 8 (eight) hours as needed for nausea or vomiting. 30 tablet 11   potassium chloride (KLOR-CON M) 10 MEQ tablet Take 10 mEq by mouth daily.     rosuvastatin (CRESTOR) 20 MG tablet Take 20 mg by mouth at bedtime.     Semaglutide, 1 MG/DOSE, (OZEMPIC, 1 MG/DOSE,) 4 MG/3ML SOPN inject  1 MILLIGRAM as directed once weekly 3 mL 1   sertraline (ZOLOFT) 100 MG tablet Take 100 mg by mouth 2 (two) times daily.     traZODone (DESYREL) 50 MG tablet Take 50 mg by mouth at bedtime.     vitamin B-12 (CYANOCOBALAMIN) 500 MCG tablet Take 500 mcg by mouth daily.     zolpidem (AMBIEN) 10 MG tablet Take 10 mg by mouth at bedtime as needed.     No current facility-administered medications on file prior to visit.    No facility-administered encounter medications on file as of 10/16/2018.    Allergies  Allergen Reactions   Sulfa Antibiotics Other (See Comments)    Weakness and fatigued      Diabetes She presents for her follow-up diabetic visit. She has type 2 diabetes mellitus. Onset time: diagnosed at approximate age of 42. Her disease course has been stable. There are no hypoglycemic associated symptoms. Pertinent negatives for hypoglycemia include no confusion, headaches, mood changes or seizures. Associated symptoms include blurred vision, fatigue and foot paresthesias. Pertinent negatives for diabetes include no chest pain, no polydipsia, no polyphagia, no polyuria and no weight loss. There are no hypoglycemic complications. Symptoms are improving. Diabetic complications include peripheral neuropathy. Risk factors for coronary artery disease include diabetes mellitus,  dyslipidemia, hypertension, obesity, post-menopausal and sedentary lifestyle. Current diabetic treatment includes insulin injections (and Ozempic). She is compliant with treatment most of the time. Her weight is fluctuating minimally. She is following a generally healthy diet. When asked about meal planning, she reported none. She has had a previous visit with a dietitian. She rarely participates in exercise. Her home blood glucose trend is fluctuating minimally. Her overall blood glucose range is 140-180 mg/dl. (She presents today with her meter and logs showing inconsistent glucose monitoring.  Her POCT A1c today is 6.2%, essentially unchanged from last visit.  Analysis of her meter shows 7-day average of 160 (4 readings), 14-day average of 154 (7 readings), 30-day average of 168 (15 readings), 90-day average of 153 (48 readings).  She notes she has some trouble with constipation/diarrhea at times- related to her diet.) An ACE inhibitor/angiotensin II receptor blocker is being taken. She does not see a podiatrist.Eye exam is current.  Hyperlipidemia This is a chronic problem. The current episode started more than 1 year ago. The problem is resistant. Recent lipid tests were reviewed and are high. Exacerbating diseases include diabetes and obesity. Factors aggravating her hyperlipidemia include thiazides. Pertinent negatives include no chest pain, leg pain, myalgias or shortness of breath. Current antihyperlipidemic treatment includes fibric acid derivatives and statins. The current treatment provides mild improvement of lipids. Compliance problems include adherence to exercise, adherence to diet and psychosocial issues.  Risk factors for coronary artery disease include diabetes mellitus, dyslipidemia, hypertension, obesity, post-menopausal and a sedentary lifestyle.  Hypertension This is a chronic problem. The current episode started more than 1 year ago. The problem has been waxing and waning since onset. The  problem is uncontrolled. Associated symptoms include blurred vision. Pertinent negatives include no chest pain, headaches or shortness of breath. There are no associated agents to hypertension. Risk factors for coronary artery disease include diabetes mellitus, sedentary lifestyle, obesity, dyslipidemia and post-menopausal state. Past treatments include calcium channel blockers, ACE inhibitors and diuretics. The current treatment provides mild improvement. Compliance problems include diet, exercise and psychosocial issues.      Review of systems  Constitutional: + stable body weight,  current Body mass index is 30.22 kg/m. , + fatigue,  no subjective hyperthermia, no subjective hypothermia Eyes: no blurry vision, no xerophthalmia ENT: no sore throat, no nodules palpated in throat, no dysphagia/odynophagia, no hoarseness Cardiovascular: no chest pain, no shortness of breath, no palpitations, no leg swelling Respiratory: no cough, no shortness of breath Gastrointestinal: no nausea/vomiting, + intermittent constipation and diarrhea Musculoskeletal: no muscle/joint aches Skin: no rashes, no hyperemia Neurological: no tremors, no numbness, no tingling, no dizziness Psychiatric: no depression, no anxiety   Objective:    BP 113/74 (BP Location: Left Arm, Patient Position: Sitting, Cuff Size: Large)   Pulse 92   Ht 5\' 2"  (1.575 m)   Wt 165 lb 3.2 oz (74.9 kg)   BMI 30.22 kg/m   Wt Readings from Last 3 Encounters:  05/01/23 165 lb 3.2 oz (74.9 kg)  04/04/23 167 lb (75.8 kg)  12/27/22 166 lb 3.2 oz (75.4 kg)   BP Readings from Last 3 Encounters:  05/01/23 113/74  04/04/23 138/82  12/27/22 112/75    Physical Exam- Limited  Constitutional:  Body mass index is 30.22 kg/m. , not in acute distress, normal state of mind Eyes:  EOMI, no exophthalmos Musculoskeletal: no gross deformities, strength intact in all four extremities, no gross restriction of joint movements Skin:  no rashes, no  hyperemia Neurological: no tremor with outstretched hands    Diabetic Foot Exam - Simple   No data filed     Lipid Panel     Component Value Date/Time   CHOL 383 (A) 02/17/2021 0000   TRIG 373 (A) 02/17/2021 0000   HDL 61 02/17/2021 0000   LDLCALC 247 02/17/2021 0000     Assessment & Plan:   1) Controlled type 2 diabetes mellitus with complication, with long-term current use of insulin (HCC)  - Patient has currently uncontrolled symptomatic type 2 DM since 72 years of age.  She presents today with her meter and logs showing inconsistent glucose monitoring.  Her POCT A1c today is 6.2%, essentially unchanged from last visit.  Analysis of her meter shows 7-day average of 160 (4 readings), 14-day average of 154 (7 readings), 30-day average of 168 (15 readings), 90-day average of 153 (48 readings).  She notes she has some trouble with constipation/diarrhea at times- related to her diet.   - Recent labs are reviewed.  POCT UM today was normal.   Her diabetes is complicated by noncompliance/nonadherence, obesity and sedentary life and patient remains at a high risk for more acute and chronic complications of diabetes which include CAD, CVA, CKD, retinopathy, and neuropathy. These are all discussed in detail with the patient.  - Nutritional counseling repeated at each appointment due to patients tendency to fall back in to old habits.  - The patient admits there is a room for improvement in their diet and drink choices. -  Suggestion is made for the patient to avoid simple carbohydrates from their diet including Cakes, Sweet Desserts / Pastries, Ice Cream, Soda (diet and regular), Sweet Tea, Candies, Chips, Cookies, Sweet Pastries, Store Bought Juices, Alcohol in Excess of 1-2 drinks a day, Artificial Sweeteners, Coffee Creamer, and "Sugar-free" Products. This will help patient to have stable blood glucose profile and potentially avoid unintended weight gain.   - I encouraged the patient  to switch to unprocessed or minimally processed complex starch and increased protein intake (animal or plant source), fruits, and vegetables.   - Patient is advised to stick to a routine mealtimes to eat 3 meals a day and avoid unnecessary snacks (to snack only to  correct hypoglycemia).  - I have approached patient with the following individualized plan to manage diabetes and patient agrees:   -she is struggling to achieve control of diabetes, likely deals with moderate cognitive deficit. Avoiding hypoglycemia is the #1 priority in her case.   -She is advised to continue her Ozempic 1 mg SQ weekly and Tresiba 30 units SQ daily. I did encourage her to increase her dietary fiber intake and water to avoid constipation.  I am hesitant to increase her Ozempic due to constipation/diarrhea.  -She does not tolerate Metformin due to GI side effects.  -She is encouraged to start consistently monitoring blood glucose twice daily, before breakfast and before bed, and to call the clinic if she has readings less than 70 or above 300 for 3 tests in a row.  -She is not a good candidate for incretin therapy due to her hypertriglyceridemia increasing her risk of pancreatitis.    2) Lipids/HPL:  Her most recent lipid panel from 04/01/22 shows uncontrolled LDL of 120 and high triglycerides of 333.  She is advised to continue Crestor 10 mg po daily at bedtime and continue Fenofibrate 145 mg po daily.  Will recheck lipid panel prior to next visit.  3) Hypertension: -Her blood pressure is controlled to target.  She is advised to continue Norvasc 5 mg po daily, Lisinopril-HCT 20/12.5 mg po daily and follow up with Dr. Sherryll Burger regarding her meds.   She is advised to continue follow-up closely with Dr. Sherryll Burger for primary care needs.     I spent  36  minutes in the care of the patient today including review of labs from CMP, Lipids, Thyroid Function, Hematology (current and previous including abstractions from other  facilities); face-to-face time discussing  her blood glucose readings/logs, discussing hypoglycemia and hyperglycemia episodes and symptoms, medications doses, her options of short and long term treatment based on the latest standards of care / guidelines;  discussion about incorporating lifestyle medicine;  and documenting the encounter. Risk reduction counseling performed per USPSTF guidelines to reduce obesity and cardiovascular risk factors.     Please refer to Patient Instructions for Blood Glucose Monitoring and Insulin/Medications Dosing Guide"  in media tab for additional information. Please  also refer to " Patient Self Inventory" in the Media  tab for reviewed elements of pertinent patient history.  Dennie Bible participated in the discussions, expressed understanding, and voiced agreement with the above plans.  All questions were answered to her satisfaction. she is encouraged to contact clinic should she have any questions or concerns prior to her return visit.   Follow up plan: Return in about 4 months (around 09/01/2023) for Diabetes F/U with A1c in office, Previsit labs, Bring meter and logs.   Ronny Bacon, [facility] [address] [place], [place] [address] Phone: [phone] Fax: [phone]

## 2023-05-02 DIAGNOSIS — G4733 Obstructive sleep apnea (adult) (pediatric): Secondary | ICD-10-CM | POA: Diagnosis not present

## 2023-05-03 DIAGNOSIS — Z23 Encounter for immunization: Secondary | ICD-10-CM | POA: Diagnosis not present

## 2023-05-03 DIAGNOSIS — G4700 Insomnia, unspecified: Secondary | ICD-10-CM | POA: Diagnosis not present

## 2023-05-03 DIAGNOSIS — I10 Essential (primary) hypertension: Secondary | ICD-10-CM | POA: Diagnosis not present

## 2023-05-03 DIAGNOSIS — Z299 Encounter for prophylactic measures, unspecified: Secondary | ICD-10-CM | POA: Diagnosis not present

## 2023-05-17 ENCOUNTER — Other Ambulatory Visit: Payer: Self-pay | Admitting: Nurse Practitioner

## 2023-05-24 ENCOUNTER — Other Ambulatory Visit: Payer: Self-pay | Admitting: "Endocrinology

## 2023-06-02 DIAGNOSIS — G4733 Obstructive sleep apnea (adult) (pediatric): Secondary | ICD-10-CM | POA: Diagnosis not present

## 2023-06-02 NOTE — Patient Instructions (Signed)
AVALEY STAHMER  06/02/2023     @PREFPERIOPPHARMACY @   Your procedure is scheduled on  06/07/2023.   Report to [facility] at  0800 A.M.   Call this number if you have problems the morning of surgery:  [phone]  If you experience any cold or flu symptoms such as cough, fever, chills, shortness of breath, etc. between now and your scheduled surgery, please notify us at the above number.   Remember:     Your last dose of semaglutide should have been on 05/30/2023.     Take 1/2 (15 units) of your usual night time insulin the night before your procedure.     Use your inhaler before you come and bring your rescue inhaler with you.    Follow the diet and prep instructions given to you by the office.    You may drink clear liquids until 0600 am on 06/07/2023.             Clear liquids allowed are:                    Water, Juice (No red color; non-citric and without pulp; diabetics please choose diet or no sugar options), Carbonated beverages (diabetics please choose diet or no sugar options), Clear Tea (No creamer, milk, or cream, including half & half and powdered creamer), Black Coffee Only (No creamer, milk or cream, including half & half and powdered creamer), Plain Jell-O Only (No red color; diabetics please choose no sugar options), Clear Sports drink (No red color; diabetics please choose diet or no sugar options), and Plain Popsicles Only (No red color; diabetics please choose no sugar options)    Take these medicines the morning of surgery with A SIP OF WATER           amlodipine, dexlansoprazole, donazepril, duloxetine, gabapentin, metoclopraide, ondansetron (if needed), sertraline.     Do not wear jewelry, make-up or nail polish, including gel polish,  artificial nails, or any other type of covering on natural nails (fingers and  toes).  Do not wear lotions, powders, or perfumes, or deodorant.  Do not shave 48 hours prior to surgery.  Men may shave  face and neck.  Do not bring valuables to the hospital.  [facility] is not responsible for any belongings or valuables.  Contacts, dentures or bridgework may not be worn into surgery.  Leave your suitcase in the car.  After surgery it may be brought to your room.  For patients admitted to the hospital, discharge time will be determined by your treatment team.  Patients discharged the day of surgery will not be allowed to drive home and must have someone with them for 24 hours.    Special instructions:   DO NOT smoke tobacco or vape for 24 hours before your procedure.  Please read over the following fact sheets that you were given. Anesthesia Post-op Instructions and Care and Recovery After Surgery      Upper Endoscopy, Adult, Care After After the procedure, it is common to have a sore throat. It is also common to have: Mild stomach pain or discomfort. Bloating. Nausea. Follow these instructions at home: The instructions below may help you care for yourself at home. Your health care provider may give you more instructions. If you have questions, ask your health care provider. If you were given a sedative during the procedure, it can affect you for several hours. Do not  drive or operate machinery until your health care provider says that it is safe. If you will be going home right after the procedure, plan to have a responsible adult: Take you home from the hospital or clinic. You will not be allowed to drive. Care for you for the time you are told. Follow instructions from your health care provider about what you may eat and drink. Return to your normal activities as told by your health care provider. Ask your health care provider what activities are safe for you. Take over-the-counter and prescription medicines only as told by your health care provider. Contact a health care provider if you: Have a sore throat that lasts longer than one day. Have trouble swallowing. Have a  fever. Get help right away if you: Vomit blood or your vomit looks like coffee grounds. Have bloody, black, or tarry stools. Have a very bad sore throat or you cannot swallow. Have difficulty breathing or very bad pain in your chest or abdomen. These symptoms may be an emergency. Get help right away. Call 911. Do not wait to see if the symptoms will go away. Do not drive yourself to the hospital. Summary After the procedure, it is common to have a sore throat, mild stomach discomfort, bloating, and nausea. If you were given a sedative during the procedure, it can affect you for several hours. Do not drive until your health care provider says that it is safe. Follow instructions from your health care provider about what you may eat and drink. Return to your normal activities as told by your health care provider. This information is not intended to replace advice given to you by your health care provider. Make sure you discuss any questions you have with your health care provider. Document Revised: 10/20/2021 Document Reviewed: 10/20/2021 Elsevier Patient Education  2024 [employer] Esophageal Dilatation Esophageal dilatation, also called esophageal dilation, is a procedure to widen or open a blocked or narrowed part of the esophagus. The esophagus is the part of the body that moves food and liquid from the mouth to the stomach. You may need this procedure if: You have a buildup of scar tissue in your esophagus that makes it difficult, painful, or impossible to swallow. This can be caused by gastroesophageal reflux disease (GERD). You have cancer of the esophagus. There is a problem with how food moves through your esophagus. In some cases, you may need this procedure repeated at a later time to dilate the esophagus gradually. Tell a health care provider about: Any allergies you have. All medicines you are taking, including vitamins, herbs, eye drops, creams, and over-the-counter  medicines. Any problems you or family members have had with anesthetic medicines. Any blood disorders you have. Any surgeries you have had. Any medical conditions you have. Any antibiotic medicines you are required to take before dental procedures. Whether you are pregnant or may be pregnant. What are the risks? Generally, this is a safe procedure. However, problems may occur, including: Bleeding due to a tear in the lining of the esophagus. A hole, or perforation, in the esophagus. What happens before the procedure? Ask your health care provider about: Changing or stopping your regular medicines. This is especially important if you are taking diabetes medicines or blood thinners. Taking medicines such as aspirin and ibuprofen. These medicines can thin your blood. Do not take these medicines unless your health care provider tells you to take them. Taking over-the-counter medicines, vitamins, herbs, and supplements. Follow instructions from your health  care provider about eating or drinking restrictions. Plan to have a responsible adult take you home from the hospital or clinic. Plan to have a responsible adult care for you for the time you are told after you leave the hospital or clinic. This is important. What happens during the procedure? You may be given a medicine to help you relax (sedative). A numbing medicine may be sprayed into the back of your throat, or you may gargle the medicine. Your health care provider may perform the dilatation using various surgical instruments, such as: Simple dilators. This instrument is carefully placed in the esophagus to stretch it. Guided wire bougies. This involves using an endoscope to insert a wire into the esophagus. A dilator is passed over this wire to enlarge the esophagus. Then the wire is removed. Balloon dilators. An endoscope with a small balloon is inserted into the esophagus. The balloon is inflated to stretch the esophagus and open it  up. The procedure may vary among health care providers and hospitals. What can I expect after the procedure? Your blood pressure, heart rate, breathing rate, and blood oxygen level will be monitored until you leave the hospital or clinic. Your throat may feel slightly sore and numb. This will get better over time. You will not be allowed to eat or drink until your throat is no longer numb. When you are able to drink, urinate, and sit on the edge of the bed without nausea or dizziness, you may be able to return home. Follow these instructions at home: Take over-the-counter and prescription medicines only as told by your health care provider. If you were given a sedative during the procedure, it can affect you for several hours. Do not drive or operate machinery until your health care provider says that it is safe. Plan to have a responsible adult care for you for the time you are told. This is important. Follow instructions from your health care provider about any eating or drinking restrictions. Do not use any products that contain nicotine or tobacco, such as cigarettes, e-cigarettes, and chewing tobacco. If you need help quitting, ask your health care provider. Keep all follow-up visits. This is important. Contact a health care provider if: You have a fever. You have pain that is not relieved by medicine. Get help right away if: You have chest pain. You have trouble breathing. You have trouble swallowing. You vomit blood. You have black, tarry, or bloody stools. These symptoms may represent a serious problem that is an emergency. Do not wait to see if the symptoms will go away. Get medical help right away. Call your local emergency services (911 in the [place] Do not drive yourself to the hospital. Summary Esophageal dilatation, also called esophageal dilation, is a procedure to widen or open a blocked or narrowed part of the esophagus. Plan to have a responsible adult take you home from  the hospital or clinic. For this procedure, a numbing medicine may be sprayed into the back of your throat, or you may gargle the medicine. Do not drive or operate machinery until your health care provider says that it is safe. This information is not intended to replace advice given to you by your health care provider. Make sure you discuss any questions you have with your health care provider. Document Revised: 11/27/2019 Document Reviewed: 11/27/2019 Elsevier Patient Education  2024 [employer] Colonoscopy, Adult, Care After The following information offers guidance on how to care for yourself after your procedure. Your health care provider  may also give you more specific instructions. If you have problems or questions, contact your health care provider. What can I expect after the procedure? After the procedure, it is common to have: A small amount of blood in your stool for 24 hours after the procedure. Some gas. Mild cramping or bloating of your abdomen. Follow these instructions at home: Eating and drinking  Drink enough fluid to keep your urine pale yellow. Follow instructions from your health care provider about eating or drinking restrictions. Resume your normal diet as told by your health care provider. Avoid heavy or fried foods that are hard to digest. Activity Rest as told by your health care provider. Avoid sitting for a long time without moving. Get up to take short walks every 1-2 hours. This is important to improve blood flow and breathing. Ask for help if you feel weak or unsteady. Return to your normal activities as told by your health care provider. Ask your health care provider what activities are safe for you. Managing cramping and bloating  Try walking around when you have cramps or feel bloated. If directed, apply heat to your abdomen as told by your health care provider. Use the heat source that your health care provider recommends, such as a moist heat pack or  a heating pad. Place a towel between your skin and the heat source. Leave the heat on for 20-30 minutes. Remove the heat if your skin turns bright red. This is especially important if you are unable to feel pain, heat, or cold. You have a greater risk of getting burned. General instructions If you were given a sedative during the procedure, it can affect you for several hours. Do not drive or operate machinery until your health care provider says that it is safe. For the first 24 hours after the procedure: Do not sign important documents. Do not drink alcohol. Do your regular daily activities at a slower pace than normal. Eat soft foods that are easy to digest. Take over-the-counter and prescription medicines only as told by your health care provider. Keep all follow-up visits. This is important. Contact a health care provider if: You have blood in your stool 2-3 days after the procedure. Get help right away if: You have more than a small spotting of blood in your stool. You have large blood clots in your stool. You have swelling of your abdomen. You have nausea or vomiting. You have a fever. You have increasing pain in your abdomen that is not relieved with medicine. These symptoms may be an emergency. Get help right away. Call 911. Do not wait to see if the symptoms will go away. Do not drive yourself to the hospital. Summary After the procedure, it is common to have a small amount of blood in your stool. You may also have mild cramping and bloating of your abdomen. If you were given a sedative during the procedure, it can affect you for several hours. Do not drive or operate machinery until your health care provider says that it is safe. Get help right away if you have a lot of blood in your stool, nausea or vomiting, a fever, or increased pain in your abdomen. This information is not intended to replace advice given to you by your health care provider. Make sure you discuss any  questions you have with your health care provider. Document Revised: 08/23/2022 Document Reviewed: 03/03/2021 Elsevier Patient Education  2024 [employer]. Monitored Anesthesia Care, Care After The following information offers guidance  on how to care for yourself after your procedure. Your health care provider may also give you more specific instructions. If you have problems or questions, contact your health care provider. What can I expect after the procedure? After the procedure, it is common to have: Tiredness. Little or no memory about what happened during or after the procedure. Impaired judgment when it comes to making decisions. Nausea or vomiting. Some trouble with balance. Follow these instructions at home: For the time period you were told by your health care provider:  Rest. Do not participate in activities where you could fall or become injured. Do not drive or use machinery. Do not drink alcohol. Do not take sleeping pills or medicines that cause drowsiness. Do not make important decisions or sign legal documents. Do not take care of children on your own. Medicines Take over-the-counter and prescription medicines only as told by your health care provider. If you were prescribed antibiotics, take them as told by your health care provider. Do not stop using the antibiotic even if you start to feel better. Eating and drinking Follow instructions from your health care provider about what you may eat and drink. Drink enough fluid to keep your urine pale yellow. If you vomit: Drink clear fluids slowly and in small amounts as you are able. Clear fluids include water, ice chips, low-calorie sports drinks, and fruit juice that has water added to it (diluted fruit juice). Eat light and bland foods in small amounts as you are able. These foods include bananas, applesauce, rice, lean meats, toast, and crackers. General instructions  Have a responsible adult stay with you for the  time you are told. It is important to have someone help care for you until you are awake and alert. If you have sleep apnea, surgery and some medicines can increase your risk for breathing problems. Follow instructions from your health care provider about wearing your sleep device: When you are sleeping. This includes during daytime naps. While taking prescription pain medicines, sleeping medicines, or medicines that make you drowsy. Do not use any products that contain nicotine or tobacco. These products include cigarettes, chewing tobacco, and vaping devices, such as e-cigarettes. If you need help quitting, ask your health care provider. Contact a health care provider if: You feel nauseous or vomit every time you eat or drink. You feel light-headed. You are still sleepy or having trouble with balance after 24 hours. You get a rash. You have a fever. You have redness or swelling around the IV site. Get help right away if: You have trouble breathing. You have new confusion after you get home. These symptoms may be an emergency. Get help right away. Call 911. Do not wait to see if the symptoms will go away. Do not drive yourself to the hospital. This information is not intended to replace advice given to you by your health care provider. Make sure you discuss any questions you have with your health care provider. Document Revised: 12/06/2021 Document Reviewed: 12/06/2021 Elsevier Patient Education  2024 [employer].

## 2023-06-05 ENCOUNTER — Other Ambulatory Visit: Payer: Self-pay

## 2023-06-05 ENCOUNTER — Encounter (HOSPITAL_COMMUNITY): Payer: Self-pay

## 2023-06-05 ENCOUNTER — Encounter (HOSPITAL_COMMUNITY)
Admission: RE | Admit: 2023-06-05 | Discharge: 2023-06-05 | Disposition: A | Discharge status: Home / Self Care | Payer: 59 | Source: Ambulatory Visit | Attending: Internal Medicine | Admitting: Internal Medicine

## 2023-06-05 VITALS — BP 125/89 | HR 99 | Temp 97.8°F | Resp 18 | Ht 62.0 in | Wt 165.1 lb

## 2023-06-05 DIAGNOSIS — Z01818 Encounter for other preprocedural examination: Secondary | ICD-10-CM | POA: Insufficient documentation

## 2023-06-05 DIAGNOSIS — I10 Essential (primary) hypertension: Secondary | ICD-10-CM | POA: Diagnosis not present

## 2023-06-05 DIAGNOSIS — R9431 Abnormal electrocardiogram [ECG] [EKG]: Secondary | ICD-10-CM | POA: Insufficient documentation

## 2023-06-05 DIAGNOSIS — Z01812 Encounter for preprocedural laboratory examination: Secondary | ICD-10-CM | POA: Diagnosis present

## 2023-06-05 DIAGNOSIS — E119 Type 2 diabetes mellitus without complications: Secondary | ICD-10-CM | POA: Insufficient documentation

## 2023-06-05 DIAGNOSIS — D508 Other iron deficiency anemias: Secondary | ICD-10-CM | POA: Diagnosis not present

## 2023-06-05 DIAGNOSIS — Z01810 Encounter for preprocedural cardiovascular examination: Secondary | ICD-10-CM | POA: Diagnosis present

## 2023-06-05 LAB — BASIC METABOLIC PANEL
Anion gap: 12 (ref 5–15)
BUN: 10 mg/dL (ref 8–23)
CO2: 24 mmol/L (ref 22–32)
Calcium: 9.1 mg/dL (ref 8.9–10.3)
Chloride: 99 mmol/L (ref 98–111)
Creatinine, Ser: 0.63 mg/dL (ref 0.44–1.00)
GFR, Estimated: >60 mL/min (ref >60)
Glucose, Bld: 198 mg/dL — ABNORMAL HIGH (ref 70–99)
Potassium: 3.4 mmol/L — ABNORMAL LOW (ref 3.5–5.1)
Sodium: 135 mmol/L (ref 135–145)

## 2023-06-05 LAB — CBC WITH DIFFERENTIAL/PLATELET
Abs Immature Granulocytes: 0.05 10*3/uL (ref 0.00–0.07)
Basophils Absolute: 0.1 10*3/uL (ref 0.0–0.1)
Basophils Relative: 1 %
Eosinophils Absolute: 0.2 10*3/uL (ref 0.0–0.5)
Eosinophils Relative: 2 %
HCT: 37.7 % (ref 36.0–46.0)
Hemoglobin: 12.2 g/dL (ref 12.0–15.0)
Immature Granulocytes: 1 %
Lymphocytes Relative: 24 %
Lymphs Abs: 2.2 10*3/uL (ref 0.7–4.0)
MCH: 31.1 pg (ref 26.0–34.0)
MCHC: 32.4 g/dL (ref 30.0–36.0)
MCV: 96.2 fL (ref 80.0–100.0)
Monocytes Absolute: 0.6 10*3/uL (ref 0.1–1.0)
Monocytes Relative: 7 %
Neutro Abs: 5.9 10*3/uL (ref 1.7–7.7)
Neutrophils Relative %: 65 %
Platelets: 449 10*3/uL — ABNORMAL HIGH (ref 150–400)
RBC: 3.92 MIL/uL (ref 3.87–5.11)
RDW: 12.1 % (ref 11.5–15.5)
WBC: 9.1 10*3/uL (ref 4.0–10.5)
nRBC: 0 % (ref 0.0–0.2)

## 2023-06-07 ENCOUNTER — Ambulatory Visit (HOSPITAL_COMMUNITY): Payer: 59 | Admitting: Anesthesiology

## 2023-06-07 ENCOUNTER — Encounter (HOSPITAL_COMMUNITY): Payer: Self-pay | Admitting: Internal Medicine

## 2023-06-07 ENCOUNTER — Encounter (HOSPITAL_COMMUNITY)
Admission: RE | Disposition: A | Discharge status: Home / Self Care | Payer: Self-pay | Source: Home / Self Care | Attending: Internal Medicine

## 2023-06-07 ENCOUNTER — Ambulatory Visit (HOSPITAL_COMMUNITY)
Admission: RE | Admit: 2023-06-07 | Discharge: 2023-06-07 | Disposition: A | Discharge status: Home / Self Care | Payer: 59 | Attending: Internal Medicine | Admitting: Internal Medicine

## 2023-06-07 ENCOUNTER — Other Ambulatory Visit: Payer: Self-pay

## 2023-06-07 DIAGNOSIS — K219 Gastro-esophageal reflux disease without esophagitis: Secondary | ICD-10-CM | POA: Diagnosis not present

## 2023-06-07 DIAGNOSIS — E119 Type 2 diabetes mellitus without complications: Secondary | ICD-10-CM | POA: Diagnosis not present

## 2023-06-07 DIAGNOSIS — G4730 Sleep apnea, unspecified: Secondary | ICD-10-CM | POA: Insufficient documentation

## 2023-06-07 DIAGNOSIS — I10 Essential (primary) hypertension: Secondary | ICD-10-CM | POA: Diagnosis not present

## 2023-06-07 DIAGNOSIS — K3189 Other diseases of stomach and duodenum: Secondary | ICD-10-CM | POA: Diagnosis not present

## 2023-06-07 DIAGNOSIS — Z1211 Encounter for screening for malignant neoplasm of colon: Secondary | ICD-10-CM | POA: Insufficient documentation

## 2023-06-07 DIAGNOSIS — D126 Benign neoplasm of colon, unspecified: Secondary | ICD-10-CM

## 2023-06-07 DIAGNOSIS — Z860100 Personal history of colon polyps, unspecified: Secondary | ICD-10-CM

## 2023-06-07 DIAGNOSIS — D122 Benign neoplasm of ascending colon: Secondary | ICD-10-CM | POA: Insufficient documentation

## 2023-06-07 DIAGNOSIS — E1143 Type 2 diabetes mellitus with diabetic autonomic (poly)neuropathy: Secondary | ICD-10-CM | POA: Insufficient documentation

## 2023-06-07 DIAGNOSIS — Z860101 Personal history of adenomatous and serrated colon polyps: Secondary | ICD-10-CM | POA: Diagnosis not present

## 2023-06-07 DIAGNOSIS — K635 Polyp of colon: Secondary | ICD-10-CM | POA: Diagnosis not present

## 2023-06-07 DIAGNOSIS — R1310 Dysphagia, unspecified: Secondary | ICD-10-CM | POA: Diagnosis not present

## 2023-06-07 DIAGNOSIS — K449 Diaphragmatic hernia without obstruction or gangrene: Secondary | ICD-10-CM

## 2023-06-07 DIAGNOSIS — J449 Chronic obstructive pulmonary disease, unspecified: Secondary | ICD-10-CM | POA: Insufficient documentation

## 2023-06-07 DIAGNOSIS — F0394 Unspecified dementia, unspecified severity, with anxiety: Secondary | ICD-10-CM | POA: Diagnosis not present

## 2023-06-07 DIAGNOSIS — K319 Disease of stomach and duodenum, unspecified: Secondary | ICD-10-CM | POA: Diagnosis not present

## 2023-06-07 HISTORY — PX: POLYPECTOMY: SHX5525

## 2023-06-07 HISTORY — PX: COLONOSCOPY WITH PROPOFOL: SHX5780

## 2023-06-07 HISTORY — PX: BIOPSY: SHX5522

## 2023-06-07 HISTORY — PX: MALONEY DILATION: SHX5535

## 2023-06-07 HISTORY — PX: ESOPHAGOGASTRODUODENOSCOPY (EGD) WITH PROPOFOL: SHX5813

## 2023-06-07 LAB — GLUCOSE, CAPILLARY: Glucose-Capillary: 117 mg/dL — ABNORMAL HIGH (ref 70–99)

## 2023-06-07 SURGERY — COLONOSCOPY WITH PROPOFOL
Anesthesia: General

## 2023-06-07 MED ORDER — LACTATED RINGERS IV SOLN: INTRAVENOUS | Status: DC | PRN

## 2023-06-07 MED ORDER — PROPOFOL 500 MG/50ML IV EMUL
INTRAVENOUS | Status: DC | PRN
Start: 2023-06-07 — End: 2023-06-07
  Administered 2023-06-07: 150 ug/kg/min via INTRAVENOUS
  Administered 2023-06-07: 80 mg via INTRAVENOUS

## 2023-06-07 MED ORDER — LIDOCAINE HCL (PF) 2 % IJ SOLN
INTRAMUSCULAR | Status: DC | PRN
  Administered 2023-06-07: 50 mg via INTRADERMAL

## 2023-06-07 NOTE — Anesthesia Preprocedure Evaluation (Addendum)
Anesthesia Evaluation  Patient identified by MRN, date of birth, ID band Patient awake    Reviewed: Allergy & Precautions, H&P , NPO status , Patient's Chart, lab work & pertinent test results, reviewed documented beta blocker date and time   Airway Mallampati: II  TM Distance: >3 FB Neck ROM: full    Dental  (+) Edentulous Upper, Edentulous Lower   Pulmonary sleep apnea , COPD   Pulmonary exam normal breath sounds clear to auscultation       Cardiovascular Exercise Tolerance: Good hypertension, Normal cardiovascular exam Rhythm:regular Rate:Normal     Neuro/Psych  Headaches, Seizures -,  PSYCHIATRIC DISORDERS Anxiety Depression   Dementia neuropathy  Neuromuscular disease    GI/Hepatic Neg liver ROS,GERD  Medicated,,  Endo/Other  diabetes, Type 2    Renal/GU negative Renal ROS  negative genitourinary   Musculoskeletal  (+) Arthritis , Osteoarthritis,    Abdominal   Peds  Hematology  (+) Blood dyscrasia, anemia   Anesthesia Other Findings   Reproductive/Obstetrics negative OB ROS                             Anesthesia Physical Anesthesia Plan  ASA: 3  Anesthesia Plan: General   Post-op Pain Management: Minimal or no pain anticipated   Induction: Intravenous  PONV Risk Score and Plan: Propofol infusion  Airway Management Planned: Nasal Cannula and Natural Airway  Additional Equipment: None  Intra-op Plan:   Post-operative Plan:   Informed Consent: I have reviewed the patients History and Physical, chart, labs and discussed the procedure including the risks, benefits and alternatives for the proposed anesthesia with the patient or authorized representative who has indicated his/her understanding and acceptance.     Dental Advisory Given  Plan Discussed with: CRNA  Anesthesia Plan Comments:         Anesthesia Quick Evaluation

## 2023-06-07 NOTE — Discharge Instructions (Signed)
EGD Discharge instructions Please read the instructions outlined below and refer to this sheet in the next few weeks. These discharge instructions provide you with general information on caring for yourself after you leave the hospital. Your doctor may also give you specific instructions. While your treatment has been planned according to the most current medical practices available, unavoidable complications occasionally occur. If you have any problems or questions after discharge, please call your doctor. ACTIVITY You may resume your regular activity but move at a slower pace for the next 24 hours.  Take frequent rest periods for the next 24 hours.  Walking will help expel (get rid of) the air and reduce the bloated feeling in your abdomen.  No driving for 24 hours (because of the anesthesia (medicine) used during the test).  You may shower.  Do not sign any important legal documents or operate any machinery for 24 hours (because of the anesthesia used during the test).  NUTRITION Drink plenty of fluids.  You may resume your normal diet.  Begin with a light meal and progress to your normal diet.  Avoid alcoholic beverages for 24 hours or as instructed by your caregiver.  MEDICATIONS You may resume your normal medications unless your caregiver tells you otherwise.  WHAT YOU CAN EXPECT TODAY You may experience abdominal discomfort such as a feeling of fullness or "gas" pains.  FOLLOW-UP Your doctor will discuss the results of your test with you.  SEEK IMMEDIATE MEDICAL ATTENTION IF ANY OF THE FOLLOWING OCCUR: Excessive nausea (feeling sick to your stomach) and/or vomiting.  Severe abdominal pain and distention (swelling).  Trouble swallowing.  Temperature over 101 F (37.8 C).  Rectal bleeding or vomiting of blood.    Colonoscopy Discharge Instructions  Read the instructions outlined below and refer to this sheet in the next few weeks. These discharge instructions provide you with  general information on caring for yourself after you leave the hospital. Your doctor may also give you specific instructions. While your treatment has been planned according to the most current medical practices available, unavoidable complications occasionally occur. If you have any problems or questions after discharge, call Dr. Jena Gauss at [phone]. ACTIVITY You may resume your regular activity, but move at a slower pace for the next 24 hours.  Take frequent rest periods for the next 24 hours.  Walking will help get rid of the air and reduce the bloated feeling in your belly (abdomen).  No driving for 24 hours (because of the medicine (anesthesia) used during the test).   Do not sign any important legal documents or operate any machinery for 24 hours (because of the anesthesia used during the test).  NUTRITION Drink plenty of fluids.  You may resume your normal diet as instructed by your doctor.  Begin with a light meal and progress to your normal diet. Heavy or fried foods are harder to digest and may make you feel sick to your stomach (nauseated).  Avoid alcoholic beverages for 24 hours or as instructed.  MEDICATIONS You may resume your normal medications unless your doctor tells you otherwise.  WHAT YOU CAN EXPECT TODAY Some feelings of bloating in the abdomen.  Passage of more gas than usual.  Spotting of blood in your stool or on the toilet paper.  IF YOU HAD POLYPS REMOVED DURING THE COLONOSCOPY: No aspirin products for 7 days or as instructed.  No alcohol for 7 days or as instructed.  Eat a soft diet for the next 24 hours.  FINDING  OUT THE RESULTS OF YOUR TEST Not all test results are available during your visit. If your test results are not back during the visit, make an appointment with your caregiver to find out the results. Do not assume everything is normal if you have not heard from your caregiver or the medical facility. It is important for you to follow up on all of your test  results.  SEEK IMMEDIATE MEDICAL ATTENTION IF: You have more than a spotting of blood in your stool.  Your belly is swollen (abdominal distention).  You are nauseated or vomiting.  You have a temperature over 101.  You have abdominal pain or discomfort that is severe or gets worse throughout the day.        Your esophagus was stretched today.  Your stomach appeared somewhat abnormal and inflamed.  Biopsies taken   6 polyps removed from your colon  Further recommendations to follow    at patient request,   I called Keenan Bachelor at [phone] findings and recommendations

## 2023-06-07 NOTE — Op Note (Signed)
[facility] Patient Name: Carrie Turner Procedure Date: 06/07/2023 9:40 AM MRN: [id number] Date of Birth: 01-08-1951 Attending MD: Gennette Pac , MD, [id number] CSN: [id number] Age: 72 Admit Type: Outpatient Procedure:                Colonoscopy Indications:              High risk colon cancer surveillance: Personal                            history of colonic polyps Providers:                Gennette Pac, MD, Nena Polio, RN, Zena Amos Referring MD:              Medicines:                Propofol per Anesthesia Complications:            No immediate complications. Estimated Blood Loss:     Estimated blood loss was minimal. Procedure:                Pre-Anesthesia Assessment:                           - Prior to the procedure, a History and Physical                            was performed, and patient medications and                            allergies were reviewed. The patient's tolerance of                            previous anesthesia was also reviewed. The risks                            and benefits of the procedure and the sedation                            options and risks were discussed with the patient.                            All questions were answered, and informed consent                            was obtained. Prior Anticoagulants: The patient has                            taken no anticoagulant or antiplatelet agents. ASA                            Grade Assessment: III - A patient with severe  systemic disease. After reviewing the risks and                            benefits, the patient was deemed in satisfactory                            condition to undergo the procedure.                           After obtaining informed consent, the colonoscope                            was passed under direct vision. Throughout the                            procedure, the patient's  blood pressure, pulse, and                            oxygen saturations were monitored continuously. The                            [id number]) scope was introduced through the                            anus and advanced to the the cecum, identified by                            appendiceal orifice and ileocecal valve. The                            colonoscopy was performed without difficulty. The                            patient tolerated the procedure well. The quality                            of the bowel preparation was adequate. The                            ileocecal valve, appendiceal orifice, and rectum                            were photographed. The colonoscopy was performed                            without difficulty. The patient tolerated the                            procedure well. The quality of the bowel                            preparation was adequate. Findings:      The perianal and digital rectal examinations were normal.      Six sessile polyps were found in the ascending  colon. The polyps were 4       to 5 mm in size. These polyps were removed with a cold snare. Resection       and retrieval were complete. Estimated blood loss was minimal.      The exam was otherwise without abnormality on direct and retroflexion       views. Impression:               - Six 4 to 5 mm polyps in the ascending colon,                            removed with a cold snare. Resected and retrieved.                           - The examination was otherwise normal on direct                            and retroflexion views. Moderate Sedation:      Moderate (conscious) sedation was personally administered by an       anesthesia professional. The following parameters were monitored: oxygen       saturation, heart rate, blood pressure, respiratory rate, EKG, adequacy       of pulmonary ventilation, and response to care. Recommendation:           - Patient has a contact  number available for                            emergencies. The signs and symptoms of potential                            delayed complications were discussed with the                            patient. Return to normal activities tomorrow.                            Written discharge instructions were provided to the                            patient.                           - Advance diet as tolerated.                           - Continue present medications.                           - Repeat colonoscopy date to be determined after                            pending pathology results are reviewed for                            surveillance.                           -  Return to GI office (date not yet determined).                            See EGD report. Procedure Code(s):        --- Professional ---                           [phone], Colonoscopy, flexible; with removal of                            tumor(s), polyp(s), or other lesion(s) by snare                            technique Diagnosis Code(s):        --- Professional ---                           Z86.010, Personal history of colonic polyps                           D12.2, Benign neoplasm of ascending colon CPT copyright 2022 American Medical Association. All rights reserved. The codes documented in this report are preliminary and upon coder review may  be revised to meet current compliance requirements. Gerrit Friends. Saliah Crisp, MD Gennette Pac, MD 06/07/2023 10:47:24 AM This report has been signed electronically. Number of Addenda: 0

## 2023-06-07 NOTE — Transfer of Care (Signed)
Immediate Anesthesia Transfer of Care Note  Patient: Carrie Turner  Procedure(s) Performed: COLONOSCOPY WITH PROPOFOL ESOPHAGOGASTRODUODENOSCOPY (EGD) WITH PROPOFOL MALONEY DILATION BIOPSY POLYPECTOMY  Patient Location: Short Stay  Anesthesia Type:General  Level of Consciousness: awake, alert , and oriented  Airway & Oxygen Therapy: Patient Spontanous Breathing  Post-op Assessment: Report given to RN, Post -op Vital signs reviewed and stable, Patient moving all extremities X 4, and Patient able to stick tongue midline  Post vital signs: Reviewed and stable  Last Vitals:  Vitals Value Taken Time  BP 115/51 06/07/23 1046  Temp 36.5 C 06/07/23 1046  Pulse 74 06/07/23 1046  Resp 9 06/07/23 1046  SpO2 100 % 06/07/23 1046    Last Pain:  Vitals:   06/07/23 1046  TempSrc: Oral  PainSc: 0-No pain         Complications: No notable events documented.

## 2023-06-07 NOTE — Op Note (Signed)
[facility] Patient Name: Carrie Turner Procedure Date: 06/07/2023 9:43 AM MRN: [id number] Date of Birth: 09-21-1950 Attending MD: Gennette Pac , MD, [id number] CSN: [id number] Age: 72 Admit Type: Outpatient Procedure:                Upper GI endoscopy Indications:              Dysphagia Providers:                Gennette Pac, MD, Nena Polio, RN, Zena Amos Referring MD:              Medicines:                Propofol per Anesthesia Complications:            No immediate complications. Estimated Blood Loss:     Estimated blood loss was minimal. Procedure:                Pre-Anesthesia Assessment:                           - Prior to the procedure, a History and Physical                            was performed, and patient medications and                            allergies were reviewed. The patient's tolerance of                            previous anesthesia was also reviewed. The risks                            and benefits of the procedure and the sedation                            options and risks were discussed with the patient.                            All questions were answered, and informed consent                            was obtained. Prior Anticoagulants: The patient has                            taken no anticoagulant or antiplatelet agents. ASA                            Grade Assessment: III - A patient with severe                            systemic disease. After reviewing the risks and  benefits, the patient was deemed in satisfactory                            condition to undergo the procedure.                           After obtaining informed consent, the endoscope was                            passed under direct vision. Throughout the                            procedure, the patient's blood pressure, pulse, and                            oxygen saturations were  monitored continuously. The                            [id number]-H190 ([id number]) scope was introduced through the                            mouth, and advanced to the second part of duodenum.                            The upper GI endoscopy was accomplished without                            difficulty. The patient tolerated the procedure                            well. Scope In: 10:00:47 AM Scope Out: 10:08:49 AM Total Procedure Duration: 0 hours 8 minutes 2 seconds  Findings:      The examined esophagus was normal.      A medium-sized hiatal hernia was present. Mild for scale or snakeskin       appearance of the gastric mucosa diffusely.      The duodenal bulb and second portion of the duodenum were normal. The       scope was withdrawn. Dilation was performed with a Maloney dilator with       no resistance at 56 Fr. The scope was withdrawn. Dilation was performed       with a Maloney dilator with no resistance at 58 Fr. The dilation site       was examined following endoscope reinsertion and showed no change.       Estimated blood loss: none. Finally, the abnormal appearing gastric       mucosa was biopsied. Impression:               - Normal esophagus. Dilated.                           - Medium-sized hiatal hernia. Abnormal appearing                            gastric mucosa of uncertain significance. Status  post biopsy.                           - Normal duodenal bulb and second portion of the                            duodenum. Moderate Sedation:      Moderate (conscious) sedation was personally administered by an       anesthesia professional. The following parameters were monitored: oxygen       saturation, heart rate, blood pressure, respiratory rate, EKG, adequacy       of pulmonary ventilation, and response to care. Recommendation:           - Patient has a contact number available for                            emergencies. The signs and symptoms  of potential                            delayed complications were discussed with the                            patient. Return to normal activities tomorrow.                            Written discharge instructions were provided to the                            patient.                           - Advance diet as tolerated. Follow-up on pathology.                           - Continue present medications. See colonoscopy                            report. Procedure Code(s):        --- Professional ---                           [phone], Esophagogastroduodenoscopy, flexible,                            transoral; diagnostic, including collection of                            specimen(s) by brushing or washing, when performed                            (separate procedure)                           43450, Dilation of esophagus, by unguided sound or                            bougie, single or multiple passes  Diagnosis Code(s):        --- Professional ---                           K44.9, Diaphragmatic hernia without obstruction or                            gangrene                           R13.10, Dysphagia, unspecified CPT copyright 2022 American Medical Association. All rights reserved. The codes documented in this report are preliminary and upon coder review may  be revised to meet current compliance requirements. Gerrit Friends. Keyonia Gluth, MD Gennette Pac, MD 06/07/2023 10:43:59 AM This report has been signed electronically. Number of Addenda: 0

## 2023-06-07 NOTE — H&P (Signed)
@LOGO @   Primary Care Physician:  Kirstie Peri, MD Primary Gastroenterologist:  Dr. Jena Gauss  Pre-Procedure History & Physical: HPI:  Carrie Turner is a 72 y.o. female here for  further management of esophageal dysphagia and polyp surveillance.  History of serrated polyp removed 2019.  Past Medical History:  Diagnosis Date   Anxiety    Arthritis    COPD (chronic obstructive pulmonary disease) (HCC)    Delayed gastric emptying    GES completed on 08/27/2019 revealed borderline delayed gastric emptying; 87% emptying at 4 hours (normal greater than 90%)   Dementia (HCC) 09/30/2019   per patient, early dementia diagnosed after seeing PCP recently   Depression    Diabetes mellitus, type II (HCC)    GERD (gastroesophageal reflux disease)    Glaucoma    Hyperlipidemia    Hypertension    Migraines    Neuropathy    Seizures (HCC)    seizure was from ETOH, "a long time ago", no med and no recurrance   Sleep apnea    Vitamin D deficiency     Past Surgical History:  Procedure Laterality Date   ABDOMINAL HYSTERECTOMY     CATARACT EXTRACTION W/PHACO Left 07/24/2019   Procedure: CATARACT EXTRACTION PHACO AND INTRAOCULAR LENS PLACEMENT LEFT EYE  (CDE: 5.60);  Surgeon: Fabio Pierce, MD;  Location: [facility];  Service: Ophthalmology;  Laterality: Left;   CATARACT EXTRACTION W/PHACO Right 08/19/2019   Procedure: CATARACT EXTRACTION PHACO AND INTRAOCULAR LENS PLACEMENT RIGHT EYE;  Surgeon: Fabio Pierce, MD;  Location: [facility];  Service: Ophthalmology;  Laterality: Right;  CDE: 8.83   CHOLECYSTECTOMY N/A 12/25/2019   Procedure: LAPAROSCOPIC CHOLECYSTECTOMY;  Surgeon: Franky Macho, MD;  Location: [facility];  Service: General;  Laterality: N/A;   COLONOSCOPY  11/2015   Dr. Teena Dunk: sessile polyp removed (benign). advised repeat colonoscopy in 5 years.    COLONOSCOPY WITH PROPOFOL N/A 10/26/2017   RMR: Tubular adenoma removed.  Random colon biopsies negative.  Nonbleeding internal hemorrhoids.  Next  colonoscopy in 5 years.   ESOPHAGEAL DILATION  10/26/2017   Procedure: ESOPHAGEAL DILATION;  Surgeon: Corbin Ade, MD;  Location: [facility];  Service: Endoscopy;;   ESOPHAGOGASTRODUODENOSCOPY  11/2015   Dr. Teena Dunk: hiatal hernia   ESOPHAGOGASTRODUODENOSCOPY (EGD) WITH PROPOFOL N/A 10/26/2017   RMR: Erosive reflux esophagitis, hiatal hernia.  Small bowel biopsies negative.   ESOPHAGOGASTRODUODENOSCOPY (EGD) WITH PROPOFOL N/A 04/15/2021   Procedure: ESOPHAGOGASTRODUODENOSCOPY (EGD) WITH PROPOFOL;  Surgeon: Corbin Ade, MD;  [facility]: [facility];  Service: Endoscopy;  Laterality: N/A;  8:45am - LM to see if pt could move up CY   INSERTION OF ANTERIOR SEGMENT AQUEOUS DRAINAGE DEVICE (ISTENT) Left 07/24/2019   Procedure: INSERTION OF ANTERIOR SEGMENT AQUEOUS DRAINAGE DEVICE (ISTENT) LEFT EYE;  Surgeon: Fabio Pierce, MD;  Location: [facility];  Service: Ophthalmology;  Laterality: Left;   INSERTION OF ANTERIOR SEGMENT AQUEOUS DRAINAGE DEVICE (ISTENT) Right 08/19/2019   Procedure: INSERTION OF ANTERIOR SEGMENT AQUEOUS DRAINAGE DEVICE (ISTENT) RIGHT EYE;  Surgeon: Fabio Pierce, MD;  Location: [facility];  Service: Ophthalmology;  Laterality: Right;   KNEE ARTHROSCOPY Right    MALONEY DILATION N/A 04/15/2021   Procedure: Elease Hashimoto DILATION;  Surgeon: Corbin Ade, MD;  Location: [facility];  Service: Endoscopy;  Laterality: N/A;   ORIF ANKLE FRACTURE Right     Prior to Admission medications   Medication Sig Start Date End Date Taking? Authorizing Provider  amLODipine (NORVASC) 5 MG tablet Take 5 mg by mouth daily.  Yes [provider]  aspirin EC 81 MG tablet Take 81 mg by mouth daily.   Yes [provider]  Cholecalciferol (VITAMIN D3) 2000 units TABS Take 2,000 Units by mouth daily.   Yes [provider]  dexlansoprazole (DEXILANT) 60 MG capsule Take 1 capsule (60 mg total) by mouth daily. 03/22/23  Yes Migel Hannis, Gerrit Friends, MD  donepezil (ARICEPT) 10 MG tablet Take  10 mg by mouth daily. 07/19/21  Yes [provider]  DULoxetine (CYMBALTA) 60 MG capsule Take 60 mg by mouth daily. 09/12/22  Yes [provider]  gabapentin (NEURONTIN) 800 MG tablet Take 800 mg by mouth 3 (three) times daily. 03/25/20  Yes [provider]  insulin degludec (TRESIBA FLEXTOUCH) 100 UNIT/ML FlexTouch Pen Inject 30 Units into the skin at bedtime. 05/01/23  Yes Dani Gobble, NP  linaclotide Karlene Einstein) 72 MCG capsule Take 1 capsule (72 mcg total) by mouth daily before breakfast. 04/04/23  Yes Niaja Stickley, Gerrit Friends, MD  lisinopril-hydrochlorothiazide (PRINZIDE,ZESTORETIC) 20-12.5 MG tablet Take 2 tablets by mouth daily.    Yes [provider]  metoCLOPramide (REGLAN) 5 MG tablet Take 5 mg by mouth in the morning and at bedtime. 01/31/20  Yes [provider]  ondansetron (ZOFRAN-ODT) 4 MG disintegrating tablet Take 1 tablet (4 mg total) by mouth every 8 (eight) hours as needed for nausea or vomiting. 04/04/23  Yes Weston Fulco, Gerrit Friends, MD  potassium chloride (KLOR-CON M) 10 MEQ tablet Take 10 mEq by mouth daily. 07/19/21  Yes [provider]  rosuvastatin (CRESTOR) 20 MG tablet Take 20 mg by mouth at bedtime. 09/12/22  Yes [provider]  sertraline (ZOLOFT) 100 MG tablet Take 100 mg by mouth 2 (two) times daily. 03/21/23  Yes [provider]  traZODone (DESYREL) 50 MG tablet Take 50 mg by mouth at bedtime. 09/12/22  Yes [provider]  vitamin B-12 (CYANOCOBALAMIN) 500 MCG tablet Take 500 mcg by mouth daily.   Yes [provider]  zolpidem (AMBIEN) 10 MG tablet Take 10 mg by mouth at bedtime as needed. 03/27/23  Yes [provider]  albuterol (VENTOLIN HFA) 108 (90 Base) MCG/ACT inhaler Inhale 1-2 puffs into the lungs every 6 (six) hours as needed for wheezing or shortness of breath.    [provider]  Blood Glucose Monitoring Suppl (ONETOUCH VERIO) w/Device KIT 1 each by Does not apply route in the  morning, at noon, in the evening, and at bedtime. 02/17/20   Roma Kayser, MD  glucose blood (ONETOUCH VERIO) test strip use to check glucose two times daily 05/17/23   Dani Gobble, NP  Insulin Pen Needle (GLOBAL EASE INJECT PEN NEEDLES) 31G X 5 MM MISC USE UP TO 4 TIMES A DAY AS DIRECTED. 05/24/23   Dani Gobble, NP  Insulin Pen Needle (PEN NEEDLES) 31G X 6 MM MISC 1 each by Does not apply route 4 (four) times daily. 03/07/19   Roma Kayser, MD  Semaglutide, 1 MG/DOSE, (OZEMPIC, 1 MG/DOSE,) 4 MG/3ML SOPN inject 1 MILLIGRAM as directed once weekly 03/16/23   Dani Gobble, NP    Allergies as of 04/14/2023 - Review Complete 04/04/2023  Allergen Reaction Noted   Sulfa antibiotics Other (See Comments) 03/04/2016    Family History  Problem Relation Age of Onset   Hypertension Mother    Colon cancer Mother        diagnosed early 66s   Cerebral aneurysm Mother    Alcohol abuse Father  Pancreatic cancer Sister        deceased in 75s   Colon cancer Sister        diagnosed 52 y/o   Cancer Maternal Grandmother    Cancer Maternal Grandfather    Cancer Paternal Grandmother    Other Paternal Grandfather    Stroke Brother    Other Brother    Breast cancer Neg Hx     Social History   Socioeconomic History   Marital status: Divorced    Spouse name: Not on file   Number of children: Not on file   Years of education: Not on file   Highest education level: Not on file  Occupational History   Not on file  Tobacco Use   Smoking status: Never   Smokeless tobacco: Never  Vaping Use   Vaping status: Never Used  Substance and Sexual Activity   Alcohol use: No   Drug use: No   Sexual activity: Not Currently  Other Topics Concern   Not on file  Social History Narrative   Not on file   Social Determinants of Health   Financial Resource Strain: Not on file  Food Insecurity: Not on file  Transportation Needs: Not on file  Physical Activity: Not on  file  Stress: Not on file  Social Connections: Socially Isolated (04/29/2021)   [facility], [facility]   Social Connection and Isolation Panel [NHANES]    Frequency of Communication with Friends and Family: More than three times a week    Frequency of Social Gatherings with Friends and Family: Once a week    Attends Religious Services: Never    Database administrator or Organizations: No    Attends Engineer, structural: Never    Marital Status: Divorced  Catering manager Violence: Not on file    Review of Systems: See HPI, otherwise negative ROS  Physical Exam: BP (!) 145/73   Pulse 85   Temp 98 F (36.7 C) (Oral)   Resp 17   Ht 5\' 2"  (1.575 m)   Wt 74.9 kg   SpO2 99%   BMI 30.20 kg/m  General:   Alert,  Well-developed, well-nourished, pleasant and cooperative in NAD Neck:  Supple; no masses or thyromegaly. No significant cervical adenopathy. Lungs:  Clear throughout to auscultation.   No wheezes, crackles, or rhonchi. No acute distress. Heart:  Regular rate and rhythm; no murmurs, clicks, rubs,  or gallops. Abdomen: Non-distended, normal bowel sounds.  Soft and nontender without appreciable mass or hepatosplenomegaly.    Impression/Plan:    72 year old lady with longstanding GERD recurrent esophageal dysphagia.  Normal esophagus 2 years ago empirically dilated with    Durable results until recently.  Here for EGD with EGD as feasible/appropriate as well as a surveillance colonoscopy per plan The risks, benefits, limitations, imponderables and alternatives regarding both EGD and colonoscopy have been reviewed with the patient. Questions have been answered. All parties agreeable.      Notice: This dictation was prepared with Dragon dictation along with smaller phrase technology. Any transcriptional errors that result from this process are unintentional and may not be corrected upon review.

## 2023-06-07 NOTE — Anesthesia Postprocedure Evaluation (Signed)
Anesthesia Post Note  Patient: Carrie Turner  Procedure(s) Performed: COLONOSCOPY WITH PROPOFOL ESOPHAGOGASTRODUODENOSCOPY (EGD) WITH PROPOFOL MALONEY DILATION BIOPSY POLYPECTOMY  Patient location during evaluation: PACU Anesthesia Type: General Level of consciousness: awake and alert Pain management: pain level controlled Vital Signs Assessment: post-procedure vital signs reviewed and stable Respiratory status: spontaneous breathing, nonlabored ventilation, respiratory function stable and patient connected to nasal cannula oxygen Cardiovascular status: blood pressure returned to baseline and stable Postop Assessment: no apparent nausea or vomiting Anesthetic complications: no   There were no known notable events for this encounter.   Last Vitals:  Vitals:   06/07/23 0847 06/07/23 1046  BP: (!) 145/73 (!) 115/51  Pulse: 85 74  Resp: 17 (!) 9  Temp: 36.7 C 36.5 C  SpO2: 99% 100%    Last Pain:  Vitals:   06/07/23 1046  TempSrc: Oral  PainSc: 0-No pain                 Stoy Fenn L Shenae Bonanno

## 2023-06-08 LAB — SURGICAL PATHOLOGY

## 2023-06-10 ENCOUNTER — Encounter: Payer: Self-pay | Admitting: Internal Medicine

## 2023-06-12 ENCOUNTER — Other Ambulatory Visit (HOSPITAL_COMMUNITY): Payer: Self-pay

## 2023-06-13 ENCOUNTER — Encounter (HOSPITAL_COMMUNITY): Payer: Self-pay | Admitting: Internal Medicine

## 2023-07-02 DIAGNOSIS — G4733 Obstructive sleep apnea (adult) (pediatric): Secondary | ICD-10-CM | POA: Diagnosis not present

## 2023-07-16 ENCOUNTER — Other Ambulatory Visit: Payer: Self-pay | Admitting: Internal Medicine

## 2023-07-16 ENCOUNTER — Other Ambulatory Visit: Payer: Self-pay | Admitting: Nurse Practitioner

## 2023-07-23 ENCOUNTER — Other Ambulatory Visit: Payer: Self-pay | Admitting: Nurse Practitioner

## 2023-08-02 DIAGNOSIS — G4733 Obstructive sleep apnea (adult) (pediatric): Secondary | ICD-10-CM | POA: Diagnosis not present

## 2023-08-04 DIAGNOSIS — J069 Acute upper respiratory infection, unspecified: Secondary | ICD-10-CM | POA: Diagnosis not present

## 2023-08-04 DIAGNOSIS — E1143 Type 2 diabetes mellitus with diabetic autonomic (poly)neuropathy: Secondary | ICD-10-CM | POA: Diagnosis not present

## 2023-08-04 DIAGNOSIS — I10 Essential (primary) hypertension: Secondary | ICD-10-CM | POA: Diagnosis not present

## 2023-08-04 DIAGNOSIS — J449 Chronic obstructive pulmonary disease, unspecified: Secondary | ICD-10-CM | POA: Diagnosis not present

## 2023-08-04 DIAGNOSIS — Z299 Encounter for prophylactic measures, unspecified: Secondary | ICD-10-CM | POA: Diagnosis not present

## 2023-08-04 DIAGNOSIS — R3911 Hesitancy of micturition: Secondary | ICD-10-CM | POA: Diagnosis not present

## 2023-08-14 ENCOUNTER — Other Ambulatory Visit: Payer: Self-pay | Admitting: Gastroenterology

## 2023-09-04 ENCOUNTER — Ambulatory Visit: Payer: 59 | Admitting: Nurse Practitioner

## 2023-09-04 DIAGNOSIS — E782 Mixed hyperlipidemia: Secondary | ICD-10-CM

## 2023-09-04 DIAGNOSIS — Z7985 Long-term (current) use of injectable non-insulin antidiabetic drugs: Secondary | ICD-10-CM

## 2023-09-04 DIAGNOSIS — Z794 Long term (current) use of insulin: Secondary | ICD-10-CM

## 2023-09-04 DIAGNOSIS — I10 Essential (primary) hypertension: Secondary | ICD-10-CM

## 2023-09-04 DIAGNOSIS — E559 Vitamin D deficiency, unspecified: Secondary | ICD-10-CM

## 2023-09-12 ENCOUNTER — Other Ambulatory Visit: Payer: Self-pay | Admitting: Nurse Practitioner

## 2023-10-27 ENCOUNTER — Other Ambulatory Visit: Payer: Self-pay | Admitting: Nurse Practitioner

## 2023-11-08 DIAGNOSIS — E1165 Type 2 diabetes mellitus with hyperglycemia: Secondary | ICD-10-CM | POA: Diagnosis not present

## 2023-11-08 DIAGNOSIS — Z794 Long term (current) use of insulin: Secondary | ICD-10-CM | POA: Diagnosis not present

## 2023-11-09 LAB — LAB REPORT - SCANNED
EGFR: 72
Free T4: 0.63 ng/dL
TSH: 2.07 (ref 0.41–5.90)

## 2023-11-13 ENCOUNTER — Encounter: Payer: Self-pay | Admitting: Nurse Practitioner

## 2023-11-13 ENCOUNTER — Ambulatory Visit (INDEPENDENT_AMBULATORY_CARE_PROVIDER_SITE_OTHER): Payer: 59 | Admitting: Nurse Practitioner

## 2023-11-13 VITALS — BP 130/76 | HR 92 | Ht 62.0 in | Wt 173.4 lb

## 2023-11-13 DIAGNOSIS — E782 Mixed hyperlipidemia: Secondary | ICD-10-CM

## 2023-11-13 DIAGNOSIS — Z7985 Long-term (current) use of injectable non-insulin antidiabetic drugs: Secondary | ICD-10-CM

## 2023-11-13 DIAGNOSIS — Z794 Long term (current) use of insulin: Secondary | ICD-10-CM

## 2023-11-13 DIAGNOSIS — I10 Essential (primary) hypertension: Secondary | ICD-10-CM

## 2023-11-13 DIAGNOSIS — E559 Vitamin D deficiency, unspecified: Secondary | ICD-10-CM

## 2023-11-13 DIAGNOSIS — E1165 Type 2 diabetes mellitus with hyperglycemia: Secondary | ICD-10-CM

## 2023-11-13 LAB — POCT GLYCOSYLATED HEMOGLOBIN (HGB A1C): Hemoglobin A1C: 6.6 % — AB (ref 4.0–5.6)

## 2023-11-13 MED ORDER — SEMAGLUTIDE (2 MG/DOSE) 8 MG/3ML ~~LOC~~ SOPN: 2.0000 mg | PEN_INJECTOR | SUBCUTANEOUS | 1 refills | Status: DC

## 2023-11-13 MED ORDER — TRESIBA FLEXTOUCH 100 UNIT/ML ~~LOC~~ SOPN: 20.0000 [IU] | PEN_INJECTOR | Freq: Every day | SUBCUTANEOUS | Status: DC

## 2023-11-13 NOTE — Progress Notes (Signed)
 11/13/2023  Endocrinology follow-up note  Subjective:    Patient ID: Carrie Turner, female    DOB: [birth year].  She is being seen in follow-up  for management of diabetes requested by Theoplis Fix, MD  Past Medical History:  Diagnosis Date   Anxiety    Arthritis    COPD (chronic obstructive pulmonary disease) (HCC)    Delayed gastric emptying    GES completed on 08/27/2019 revealed borderline delayed gastric emptying; 87% emptying at 4 hours (normal greater than 90%)   Dementia (HCC) 09/30/2019   per patient, early dementia diagnosed after seeing PCP recently   Depression    Diabetes mellitus, type II (HCC)    GERD (gastroesophageal reflux disease)    Glaucoma    Hyperlipidemia    Hypertension    Migraines    Neuropathy    Seizures (HCC)    seizure was from ETOH, "a long time ago", no med and no recurrance   Sleep apnea    Vitamin D  deficiency     Past Surgical History:  Procedure Laterality Date   ABDOMINAL HYSTERECTOMY     BIOPSY  06/07/2023   Procedure: BIOPSY;  Surgeon: Suzette Espy, MD;  [facility]: [facility];  Service: Endoscopy;;   CATARACT EXTRACTION W/PHACO Left 07/24/2019   Procedure: CATARACT EXTRACTION PHACO AND INTRAOCULAR LENS PLACEMENT LEFT EYE  (CDE: 5.60);  Surgeon: Tarri Farm, MD;  Location: [facility];  Service: Ophthalmology;  Laterality: Left;   CATARACT EXTRACTION W/PHACO Right 08/19/2019   Procedure: CATARACT EXTRACTION PHACO AND INTRAOCULAR LENS PLACEMENT RIGHT EYE;  Surgeon: Tarri Farm, MD;  Location: [facility];  Service: Ophthalmology;  Laterality: Right;  CDE: 8.83   CHOLECYSTECTOMY N/A 12/25/2019   Procedure: LAPAROSCOPIC CHOLECYSTECTOMY;  Surgeon: Alanda Allegra, MD;  Location: [facility];  Service: General;  Laterality: N/A;   COLONOSCOPY  11/2015   Dr. Alline Ivans: sessile polyp removed (benign). advised repeat colonoscopy in 5 years.    COLONOSCOPY WITH PROPOFOL  N/A 10/26/2017   RMR: Tubular adenoma removed.  Random colon biopsies negative.   Nonbleeding internal hemorrhoids.  Next colonoscopy in 5 years.   COLONOSCOPY WITH PROPOFOL  N/A 06/07/2023   Procedure: COLONOSCOPY WITH PROPOFOL ;  Surgeon: Suzette Espy, MD;  [facility]: [facility];  Service: Endoscopy;  Laterality: N/A;  10:00 am, asa 3   ESOPHAGEAL DILATION  10/26/2017   Procedure: ESOPHAGEAL DILATION;  Surgeon: Suzette Espy, MD;  [facility]: [facility];  Service: Endoscopy;;   ESOPHAGOGASTRODUODENOSCOPY  11/2015   Dr. Alline Ivans: hiatal hernia   ESOPHAGOGASTRODUODENOSCOPY (EGD) WITH PROPOFOL  N/A 10/26/2017   RMR: Erosive reflux esophagitis, hiatal hernia.  Small bowel biopsies negative.   ESOPHAGOGASTRODUODENOSCOPY (EGD) WITH PROPOFOL  N/A 04/15/2021   Procedure: ESOPHAGOGASTRODUODENOSCOPY (EGD) WITH PROPOFOL ;  Surgeon: Suzette Espy, MD;  [facility];  Service: Endoscopy;  Laterality: N/A;  8:45am - LM to see if pt could move up CY   ESOPHAGOGASTRODUODENOSCOPY (EGD) WITH PROPOFOL  N/A 06/07/2023   Procedure: ESOPHAGOGASTRODUODENOSCOPY (EGD) WITH PROPOFOL ;  Surgeon: Suzette Espy, MD;  [facility];  Service: Endoscopy;  Laterality: N/A;   INSERTION OF ANTERIOR SEGMENT AQUEOUS DRAINAGE DEVICE (ISTENT) Left 07/24/2019   Procedure: INSERTION OF ANTERIOR SEGMENT AQUEOUS DRAINAGE DEVICE (ISTENT) LEFT EYE;  Surgeon: Tarri Farm, MD;  Location: [facility];  Service: Ophthalmology;  Laterality: Left;   INSERTION OF ANTERIOR SEGMENT AQUEOUS DRAINAGE DEVICE (ISTENT) Right 08/19/2019   Procedure: INSERTION OF ANTERIOR SEGMENT AQUEOUS DRAINAGE DEVICE (ISTENT) RIGHT EYE;  Surgeon: Tarri Farm, MD;  Location: [facility];  Service: Ophthalmology;  Laterality: Right;   KNEE ARTHROSCOPY Right    MALONEY DILATION N/A 04/15/2021   Procedure: Londa Rival DILATION;  Surgeon: Suzette Espy, MD;  [facility];  Service: Endoscopy;  Laterality: N/A;   MALONEY DILATION N/A 06/07/2023   Procedure: Londa Rival DILATION;  Surgeon: Suzette Espy, MD;  [facility];   Service: Endoscopy;  Laterality: N/A;   ORIF ANKLE FRACTURE Right    POLYPECTOMY  06/07/2023   Procedure: POLYPECTOMY;  Surgeon: Suzette Espy, MD;  [facility];  Service: Endoscopy;;     Social History   Socioeconomic History   Marital status: Divorced    Spouse name: Not on file   Number of children: Not on file   Years of education: Not on file   Highest education level: Not on file  Occupational History   Not on file  Tobacco Use   Smoking status: Never   Smokeless tobacco: Never  Vaping Use   Vaping status: Never Used  Substance and Sexual Activity   Alcohol  use: No   Drug use: No   Sexual activity: Not Currently  Other Topics Concern   Not on file  Social History Narrative   Not on file   Social Drivers of Health   Financial Resource Strain: Not on file  Food Insecurity: Not on file  Transportation Needs: Not on file  Physical Activity: Not on file  Stress: Not on file  Social Connections: Socially Isolated (04/29/2021)   Received from [facility]   Social Connection and Isolation Panel [NHANES]    Frequency of Communication with Friends and Family: More than three times a week    Frequency of Social Gatherings with Friends and Family: Once a week    Attends Religious Services: Never    Database administrator or Organizations: No    Attends Engineer, structural: Never    Marital Status: Divorced  Catering manager Violence: Not on file    Current Outpatient Medications on File Prior to Visit  Medication Sig Dispense Refill   albuterol (VENTOLIN HFA) 108 (90 Base) MCG/ACT inhaler Inhale 1-2 puffs into the lungs every 6 (six) hours as needed for wheezing or shortness of breath.     amLODipine (NORVASC) 5 MG tablet Take 5 mg by mouth daily.      aspirin EC 81 MG tablet Take 81 mg by mouth daily.     Blood Glucose Monitoring Suppl (ONETOUCH VERIO) w/Device KIT 1 each by Does not apply route in the morning, at noon, in  the evening, and at bedtime. 1 kit 0   Cholecalciferol (VITAMIN D3) 2000 units TABS Take 2,000 Units by mouth daily.     dexlansoprazole  (DEXILANT ) 60 MG capsule Take 1 capsule (60 mg total) by mouth daily. 90 capsule 3   donepezil (ARICEPT) 10 MG tablet Take 10 mg by mouth daily.     DULoxetine (CYMBALTA) 60 MG capsule Take 60 mg by mouth daily.     gabapentin (NEURONTIN) 800 MG tablet Take 800 mg by mouth 3 (three) times daily.     glucose blood (ONETOUCH VERIO) test strip use to check glucose two times daily 100 strip 3   Insulin  Pen Needle (GLOBAL EASE INJECT PEN NEEDLES) 31G X 5 MM MISC use up to 4 times a day as directed. 200 each 3   Insulin  Pen Needle (PEN NEEDLES) 31G X 6 MM MISC 1 each by  Does not apply route 4 (four) times daily. 400 each 1   linaclotide  (LINZESS ) 72 MCG capsule Take 1 capsule (72 mcg total) by mouth daily before breakfast. 30 capsule 11   lisinopril-hydrochlorothiazide (PRINZIDE,ZESTORETIC) 20-12.5 MG tablet Take 2 tablets by mouth daily.      metoCLOPramide (REGLAN) 5 MG tablet Take 5 mg by mouth in the morning and at bedtime.     ondansetron  (ZOFRAN -ODT) 4 MG disintegrating tablet Take 1 tablet (4 mg total) by mouth every 8 (eight) hours as needed for nausea or vomiting. 30 tablet 11   potassium chloride  (KLOR-CON  M) 10 MEQ tablet Take 10 mEq by mouth daily.     rosuvastatin (CRESTOR) 20 MG tablet Take 20 mg by mouth at bedtime.     sertraline (ZOLOFT) 100 MG tablet Take 100 mg by mouth 2 (two) times daily.     traZODone (DESYREL) 50 MG tablet Take 50 mg by mouth at bedtime.     vitamin B-12 (CYANOCOBALAMIN) 500 MCG tablet Take 500 mcg by mouth daily.     zolpidem (AMBIEN) 10 MG tablet Take 10 mg by mouth at bedtime as needed.     No current facility-administered medications on file prior to visit.    No facility-administered encounter medications on file as of 10/16/2018.    Allergies  Allergen Reactions   Sulfa Antibiotics Other (See Comments)    Weakness  and fatigued      Diabetes She presents for her follow-up diabetic visit. She has type 2 diabetes mellitus. Onset time: diagnosed at approximate age of 32. Her disease course has been stable. There are no hypoglycemic associated symptoms. Pertinent negatives for hypoglycemia include no confusion, headaches, mood changes or seizures. Associated symptoms include blurred vision, fatigue and foot paresthesias. Pertinent negatives for diabetes include no chest pain, no polydipsia, no polyphagia, no polyuria and no weight loss. There are no hypoglycemic complications. Symptoms are improving. Diabetic complications include peripheral neuropathy. Risk factors for coronary artery disease include diabetes mellitus, dyslipidemia, hypertension, obesity, post-menopausal and sedentary lifestyle. Current diabetic treatment includes insulin  injections (and Ozempic ). She is compliant with treatment most of the time. Her weight is fluctuating minimally. She is following a generally healthy diet. When asked about meal planning, she reported none. She has had a previous visit with a dietitian. She rarely participates in exercise. Her home blood glucose trend is fluctuating minimally. Her overall blood glucose range is 140-180 mg/dl. (She presents today with her meter and logs showing stable, at target glycemic profile.  Her POCT A1c today is 6.6%, increasing slightly from last visit of 6.2%.  She denies any hypoglycemia.  She has gained 7 lbs since last visit which she is bummed about.) An ACE inhibitor/angiotensin II receptor blocker is being taken. She does not see a podiatrist.Eye exam is current.  Hyperlipidemia This is a chronic problem. The current episode started more than 1 year ago. The problem is resistant. Recent lipid tests were reviewed and are high. Exacerbating diseases include diabetes and obesity. Factors aggravating her hyperlipidemia include thiazides. Pertinent negatives include no chest pain, leg pain,  myalgias or shortness of breath. Current antihyperlipidemic treatment includes fibric acid derivatives and statins. The current treatment provides mild improvement of lipids. Compliance problems include adherence to exercise, adherence to diet and psychosocial issues.  Risk factors for coronary artery disease include diabetes mellitus, dyslipidemia, hypertension, obesity, post-menopausal and a sedentary lifestyle.  Hypertension This is a chronic problem. The current episode started more than 1 year ago. The problem  has been waxing and waning since onset. The problem is uncontrolled. Associated symptoms include blurred vision. Pertinent negatives include no chest pain, headaches or shortness of breath. There are no associated agents to hypertension. Risk factors for coronary artery disease include diabetes mellitus, sedentary lifestyle, obesity, dyslipidemia and post-menopausal state. Past treatments include calcium channel blockers, ACE inhibitors and diuretics. The current treatment provides mild improvement. Compliance problems include diet, exercise and psychosocial issues.      Review of systems  Constitutional: + increasing body weight,  current Body mass index is 31.72 kg/m. , + fatigue (has appt with PCP regarding this soon), no subjective hyperthermia, no subjective hypothermia Eyes: no blurry vision, no xerophthalmia ENT: no sore throat, no nodules palpated in throat, no dysphagia/odynophagia, no hoarseness Cardiovascular: no chest pain, no shortness of breath, no palpitations, no leg swelling Respiratory: no cough, no shortness of breath Gastrointestinal: no nausea/vomiting, no diarrhea or constipation Musculoskeletal: no muscle/joint aches Skin: no rashes, no hyperemia Neurological: no tremors, no numbness, no tingling, no dizziness Psychiatric: no depression, no anxiety   Objective:    BP 130/76 (BP Location: Left Arm, Patient Position: Sitting, Cuff Size: Large)   Pulse 92   Ht  5\' 2"  (1.575 m)   Wt 173 lb 6.4 oz (78.7 kg)   BMI 31.72 kg/m   Wt Readings from Last 3 Encounters:  11/13/23 173 lb 6.4 oz (78.7 kg)  06/07/23 165 lb 2 oz (74.9 kg)  06/05/23 165 lb 2 oz (74.9 kg)   BP Readings from Last 3 Encounters:  11/13/23 130/76  06/07/23 (!) 115/51  06/05/23 125/89     Physical Exam- Limited  Constitutional:  Body mass index is 31.72 kg/m. , not in acute distress, normal state of mind Eyes:  EOMI, no exophthalmos Musculoskeletal: no gross deformities, strength intact in all four extremities, no gross restriction of joint movements Skin:  no rashes, no hyperemia Neurological: no tremor with outstretched hands    Diabetic Foot Exam - Simple   No data filed     Lipid Panel     Component Value Date/Time   CHOL 383 (A) 02/17/2021 0000   TRIG 373 (A) 02/17/2021 0000   HDL 61 02/17/2021 0000   LDLCALC 247 02/17/2021 0000     Assessment & Plan:   1) Controlled type 2 diabetes mellitus with complication, with long-term current use of insulin  (HCC)  - Patient has currently uncontrolled symptomatic type 2 DM since 73 years of age.  She presents today with her meter and logs showing stable, at target glycemic profile.  Her POCT A1c today is 6.6%, increasing slightly from last visit of 6.2%.  She denies any hypoglycemia.  She has gained 7 lbs since last visit which she is bummed about.   - Recent labs are reviewed.    Her diabetes is complicated by noncompliance/nonadherence, obesity and sedentary life and patient remains at a high risk for more acute and chronic complications of diabetes which include CAD, CVA, CKD, retinopathy, and neuropathy. These are all discussed in detail with the patient.  - Nutritional counseling repeated at each appointment due to patients tendency to fall back in to old habits.  - The patient admits there is a room for improvement in their diet and drink choices. -  Suggestion is made for the patient to avoid simple  carbohydrates from their diet including Cakes, Sweet Desserts / Pastries, Ice Cream, Soda (diet and regular), Sweet Tea, Candies, Chips, Cookies, Sweet Pastries, Store Bought Juices, Alcohol  in  Excess of 1-2 drinks a day, Artificial Sweeteners, Coffee Creamer, and "Sugar-free" Products. This will help patient to have stable blood glucose profile and potentially avoid unintended weight gain.   - I encouraged the patient to switch to unprocessed or minimally processed complex starch and increased protein intake (animal or plant source), fruits, and vegetables.   - Patient is advised to stick to a routine mealtimes to eat 3 meals a day and avoid unnecessary snacks (to snack only to correct hypoglycemia).  - I have approached patient with the following individualized plan to manage diabetes and patient agrees:   -she is struggling to achieve control of diabetes, likely deals with moderate cognitive deficit. Avoiding hypoglycemia is the #1 priority in her case.   -She is advised to increase Ozempic  to 2 mg SQ weekly and lower Tresiba  to 20 units SQ daily.   -She does not tolerate Metformin  due to GI side effects.  -She is encouraged to start consistently monitoring blood glucose twice daily, before breakfast and before bed, and to call the clinic if she has readings less than 70 or above 300 for 3 tests in a row.  -She is not a good candidate for incretin therapy due to her hypertriglyceridemia increasing her risk of pancreatitis.    2) Lipids/HPL:  Her most recent lipid panel from 11/08/23 shows uncontrolled LDL of 122 and high triglycerides of 193 (improving).  She is advised to continue Crestor 10 mg po daily at bedtime and continue Fenofibrate  145 mg po daily.  I instructed her to avoid fried foods.  3) Hypertension: -Her blood pressure is controlled to target.  She is advised to continue Norvasc 5 mg po daily, Lisinopril-HCT 20/12.5 mg po daily and follow up with Dr. Mason Sole regarding her  meds.   She is advised to continue follow-up closely with Dr. Mason Sole for primary care needs.      I spent  34  minutes in the care of the patient today including review of labs from CMP, Lipids, Thyroid  Function, Hematology (current and previous including abstractions from other facilities); face-to-face time discussing  her blood glucose readings/logs, discussing hypoglycemia and hyperglycemia episodes and symptoms, medications doses, her options of short and long term treatment based on the latest standards of care / guidelines;  discussion about incorporating lifestyle medicine;  and documenting the encounter. Risk reduction counseling performed per USPSTF guidelines to reduce obesity and cardiovascular risk factors.     Please refer to Patient Instructions for Blood Glucose Monitoring and Insulin /Medications Dosing Guide"  in media tab for additional information. Please  also refer to " Patient Self Inventory" in the Media  tab for reviewed elements of pertinent patient history.  Gayleen Kawasaki participated in the discussions, expressed understanding, and voiced agreement with the above plans.  All questions were answered to her satisfaction. she is encouraged to contact clinic should she have any questions or concerns prior to her return visit.   Follow up plan: Return in about 4 months (around 03/14/2024) for Diabetes F/U with A1c in office, No previsit labs, Bring meter and logs.   Hulon Magic, [facility] [address] [place], [place] [address] Phone: [phone] Fax: [phone]

## 2023-11-24 DIAGNOSIS — Z0000 Encounter for general adult medical examination without abnormal findings: Secondary | ICD-10-CM | POA: Diagnosis not present

## 2023-11-24 DIAGNOSIS — R5383 Other fatigue: Secondary | ICD-10-CM | POA: Diagnosis not present

## 2023-11-24 DIAGNOSIS — Z79899 Other long term (current) drug therapy: Secondary | ICD-10-CM | POA: Diagnosis not present

## 2023-11-24 DIAGNOSIS — Z7189 Other specified counseling: Secondary | ICD-10-CM | POA: Diagnosis not present

## 2023-11-24 DIAGNOSIS — E7800 Pure hypercholesterolemia, unspecified: Secondary | ICD-10-CM | POA: Diagnosis not present

## 2023-11-24 DIAGNOSIS — Z299 Encounter for prophylactic measures, unspecified: Secondary | ICD-10-CM | POA: Diagnosis not present

## 2023-11-24 DIAGNOSIS — I10 Essential (primary) hypertension: Secondary | ICD-10-CM | POA: Diagnosis not present

## 2023-11-27 DIAGNOSIS — Z79899 Other long term (current) drug therapy: Secondary | ICD-10-CM | POA: Diagnosis not present

## 2023-11-27 DIAGNOSIS — E7800 Pure hypercholesterolemia, unspecified: Secondary | ICD-10-CM | POA: Diagnosis not present

## 2023-11-27 DIAGNOSIS — E559 Vitamin D deficiency, unspecified: Secondary | ICD-10-CM | POA: Diagnosis not present

## 2023-11-27 DIAGNOSIS — R42 Dizziness and giddiness: Secondary | ICD-10-CM | POA: Diagnosis not present

## 2023-11-27 DIAGNOSIS — R5383 Other fatigue: Secondary | ICD-10-CM | POA: Diagnosis not present

## 2023-12-26 DIAGNOSIS — E559 Vitamin D deficiency, unspecified: Secondary | ICD-10-CM | POA: Diagnosis not present

## 2023-12-26 DIAGNOSIS — R42 Dizziness and giddiness: Secondary | ICD-10-CM | POA: Diagnosis not present

## 2023-12-26 DIAGNOSIS — D473 Essential (hemorrhagic) thrombocythemia: Secondary | ICD-10-CM | POA: Diagnosis not present

## 2023-12-26 DIAGNOSIS — Z299 Encounter for prophylactic measures, unspecified: Secondary | ICD-10-CM | POA: Diagnosis not present

## 2023-12-26 DIAGNOSIS — I10 Essential (primary) hypertension: Secondary | ICD-10-CM | POA: Diagnosis not present

## 2024-01-07 ENCOUNTER — Emergency Department (HOSPITAL_COMMUNITY)

## 2024-01-07 ENCOUNTER — Encounter (HOSPITAL_COMMUNITY): Payer: Self-pay | Admitting: Emergency Medicine

## 2024-01-07 ENCOUNTER — Emergency Department (HOSPITAL_COMMUNITY)
Admission: EM | Admit: 2024-01-07 | Discharge: 2024-01-07 | Disposition: A | Discharge status: Home / Self Care | Attending: Emergency Medicine | Admitting: Emergency Medicine

## 2024-01-07 ENCOUNTER — Other Ambulatory Visit: Payer: Self-pay

## 2024-01-07 DIAGNOSIS — I10 Essential (primary) hypertension: Secondary | ICD-10-CM | POA: Diagnosis not present

## 2024-01-07 DIAGNOSIS — R000 Tachycardia, unspecified: Secondary | ICD-10-CM | POA: Diagnosis not present

## 2024-01-07 DIAGNOSIS — Z7982 Long term (current) use of aspirin: Secondary | ICD-10-CM | POA: Insufficient documentation

## 2024-01-07 DIAGNOSIS — R55 Syncope and collapse: Secondary | ICD-10-CM | POA: Diagnosis not present

## 2024-01-07 DIAGNOSIS — R0689 Other abnormalities of breathing: Secondary | ICD-10-CM | POA: Diagnosis not present

## 2024-01-07 DIAGNOSIS — Z79899 Other long term (current) drug therapy: Secondary | ICD-10-CM | POA: Insufficient documentation

## 2024-01-07 DIAGNOSIS — R918 Other nonspecific abnormal finding of lung field: Secondary | ICD-10-CM | POA: Diagnosis not present

## 2024-01-07 DIAGNOSIS — Z794 Long term (current) use of insulin: Secondary | ICD-10-CM | POA: Diagnosis not present

## 2024-01-07 DIAGNOSIS — R42 Dizziness and giddiness: Secondary | ICD-10-CM | POA: Diagnosis not present

## 2024-01-07 DIAGNOSIS — I6523 Occlusion and stenosis of bilateral carotid arteries: Secondary | ICD-10-CM | POA: Diagnosis not present

## 2024-01-07 DIAGNOSIS — R0602 Shortness of breath: Secondary | ICD-10-CM | POA: Diagnosis not present

## 2024-01-07 DIAGNOSIS — J449 Chronic obstructive pulmonary disease, unspecified: Secondary | ICD-10-CM | POA: Diagnosis not present

## 2024-01-07 DIAGNOSIS — N3000 Acute cystitis without hematuria: Secondary | ICD-10-CM

## 2024-01-07 DIAGNOSIS — W19XXXA Unspecified fall, initial encounter: Secondary | ICD-10-CM | POA: Diagnosis not present

## 2024-01-07 DIAGNOSIS — G9389 Other specified disorders of brain: Secondary | ICD-10-CM | POA: Diagnosis not present

## 2024-01-07 LAB — URINALYSIS, ROUTINE W REFLEX MICROSCOPIC
Bilirubin Urine: NEGATIVE
Glucose, UA: NEGATIVE mg/dL
Hgb urine dipstick: NEGATIVE
Ketones, ur: NEGATIVE mg/dL
Nitrite: NEGATIVE
Protein, ur: NEGATIVE mg/dL
Specific Gravity, Urine: 1.009 (ref 1.005–1.030)
WBC, UA: >50 WBC/hpf (ref 0–5)
pH: 5 (ref 5.0–8.0)

## 2024-01-07 LAB — CBC
HCT: 36.8 % (ref 36.0–46.0)
Hemoglobin: 12.5 g/dL (ref 12.0–15.0)
MCH: 31.8 pg (ref 26.0–34.0)
MCHC: 34 g/dL (ref 30.0–36.0)
MCV: 93.6 fL (ref 80.0–100.0)
Platelets: 344 10*3/uL (ref 150–400)
RBC: 3.93 MIL/uL (ref 3.87–5.11)
RDW: 12.2 % (ref 11.5–15.5)
WBC: 15.4 10*3/uL — ABNORMAL HIGH (ref 4.0–10.5)
nRBC: 0 % (ref 0.0–0.2)

## 2024-01-07 LAB — COMPREHENSIVE METABOLIC PANEL WITH GFR
ALT: 17 U/L (ref 0–44)
AST: 27 U/L (ref 15–41)
Albumin: 3.9 g/dL (ref 3.5–5.0)
Alkaline Phosphatase: 83 U/L (ref 38–126)
Anion gap: 15 (ref 5–15)
BUN: 16 mg/dL (ref 8–23)
CO2: 23 mmol/L (ref 22–32)
Calcium: 9.5 mg/dL (ref 8.9–10.3)
Chloride: 95 mmol/L — ABNORMAL LOW (ref 98–111)
Creatinine, Ser: 0.8 mg/dL (ref 0.44–1.00)
GFR, Estimated: >60 mL/min (ref >60)
Glucose, Bld: 143 mg/dL — ABNORMAL HIGH (ref 70–99)
Potassium: 3.7 mmol/L (ref 3.5–5.1)
Sodium: 133 mmol/L — ABNORMAL LOW (ref 135–145)
Total Bilirubin: 0.5 mg/dL (ref 0.0–1.2)
Total Protein: 6.8 g/dL (ref 6.5–8.1)

## 2024-01-07 LAB — CBG MONITORING, ED: Glucose-Capillary: 138 mg/dL — ABNORMAL HIGH (ref 70–99)

## 2024-01-07 MED ORDER — CEPHALEXIN 500 MG PO CAPS: 500.0000 mg | ORAL_CAPSULE | Freq: Four times a day (QID) | ORAL | 0 refills | Status: AC

## 2024-01-07 MED ORDER — SODIUM CHLORIDE 0.9 % IV BOLUS
2000.0000 mL | Freq: Once | INTRAVENOUS | Status: AC
  Administered 2024-01-07: 2000 mL via INTRAVENOUS

## 2024-01-07 MED ORDER — SODIUM CHLORIDE 0.9 % IV SOLN
2.0000 g | Freq: Once | INTRAVENOUS | Status: AC
  Administered 2024-01-07: 2 g via INTRAVENOUS
  Filled 2024-01-07: qty 20

## 2024-01-07 NOTE — Discharge Instructions (Addendum)
 Plenty of fluids.  Follow-up with your family doctor this week for recheck.  You may have any urinary tract infection and we will start you on antibiotics

## 2024-01-07 NOTE — ED Triage Notes (Signed)
 Pt fell outside but denies any pain. She states she laid outside x 2 hours before someone found her. Pt states the last two days she stayed in bed with very little to eat or drink. Ems states she is ortho-hypotensive.

## 2024-01-07 NOTE — ED Provider Notes (Signed)
 [place] EMERGENCY DEPARTMENT AT [facility] Provider Note   CSN: [id number] Arrival date & time: 01/07/24  1724     Patient presents with: Carrie Turner is a 73 y.o. female.  {Add pertinent medical, surgical, social history, OB history to WUJ:81191} Patient has a history of hypertension and COPD.  She became dizzy and fell down to the ground.  This has been happening for many months now.  She did not lose consciousness.  According to EMS she was mildly orthostatic.   Fall       Prior to Admission medications   Medication Sig Start Date End Date Taking? Authorizing Provider  albuterol (VENTOLIN HFA) 108 (90 Base) MCG/ACT inhaler Inhale 1-2 puffs into the lungs every 6 (six) hours as needed for wheezing or shortness of breath.    [provider]  amLODipine (NORVASC) 5 MG tablet Take 5 mg by mouth daily.     [provider]  aspirin EC 81 MG tablet Take 81 mg by mouth daily.    [provider]  Blood Glucose Monitoring Suppl (ONETOUCH VERIO) w/Device KIT 1 each by Does not apply route in the morning, at noon, in the evening, and at bedtime. 02/17/20   Nida, Gebreselassie W, MD  Cholecalciferol (VITAMIN D3) 2000 units TABS Take 2,000 Units by mouth daily.    [provider]  dexlansoprazole  (DEXILANT ) 60 MG capsule Take 1 capsule (60 mg total) by mouth daily. 08/15/23   Rourk, Windsor Hatcher, MD  donepezil (ARICEPT) 10 MG tablet Take 10 mg by mouth daily. 07/19/21   [provider]  DULoxetine (CYMBALTA) 60 MG capsule Take 60 mg by mouth daily. 09/12/22   [provider]  gabapentin (NEURONTIN) 800 MG tablet Take 800 mg by mouth 3 (three) times daily. 03/25/20   [provider]  glucose blood (ONETOUCH VERIO) test strip use to check glucose two times daily 05/17/23   Wendel Hals, NP  insulin  degludec (TRESIBA  FLEXTOUCH) 100 UNIT/ML FlexTouch Pen Inject 20 Units into the skin at bedtime. 11/13/23    Wendel Hals, NP  Insulin  Pen Needle (GLOBAL EASE INJECT PEN NEEDLES) 31G X 5 MM MISC use up to 4 times a day as directed. 09/12/23   Wendel Hals, NP  Insulin  Pen Needle (PEN NEEDLES) 31G X 6 MM MISC 1 each by Does not apply route 4 (four) times daily. 03/07/19   Nida, Gebreselassie W, MD  linaclotide  (LINZESS ) 72 MCG capsule Take 1 capsule (72 mcg total) by mouth daily before breakfast. 04/04/23   Rourk, Windsor Hatcher, MD  lisinopril-hydrochlorothiazide (PRINZIDE,ZESTORETIC) 20-12.5 MG tablet Take 2 tablets by mouth daily.     [provider]  metoCLOPramide (REGLAN) 5 MG tablet Take 5 mg by mouth in the morning and at bedtime. 01/31/20   [provider]  ondansetron  (ZOFRAN -ODT) 4 MG disintegrating tablet Take 1 tablet (4 mg total) by mouth every 8 (eight) hours as needed for nausea or vomiting. 04/04/23   Rourk, Windsor Hatcher, MD  potassium chloride  (KLOR-CON  M) 10 MEQ tablet Take 10 mEq by mouth daily. 07/19/21   [provider]  rosuvastatin (CRESTOR) 20 MG tablet Take 20 mg by mouth at bedtime. 09/12/22   [provider]  Semaglutide , 2 MG/DOSE, 8 MG/3ML SOPN Inject 2 mg as directed once a week. 11/13/23   Wendel Hals, NP  sertraline (ZOLOFT) 100 MG tablet Take 100 mg by mouth 2 (two) times daily. 03/21/23   [provider]  traZODone (DESYREL) 50 MG tablet Take 50 mg by mouth at bedtime. 09/12/22   [provider]  vitamin B-12 (CYANOCOBALAMIN) 500 MCG tablet Take 500 mcg by mouth daily.    [provider]  zolpidem (AMBIEN) 10 MG tablet Take 10 mg by mouth at bedtime as needed. 03/27/23   [provider]    Allergies: Sulfa antibiotics    Review of Systems  Updated Vital Signs BP 114/72   Pulse 87   Temp 98.3 F (36.8 C) (Oral)   Resp 13   Ht 5' 2 (1.575 m)   Wt 78 kg   SpO2 97%   BMI 31.45 kg/m   Physical Exam  (all labs ordered are listed, but only abnormal results are displayed) Labs Reviewed   COMPREHENSIVE METABOLIC PANEL WITH GFR - Abnormal; Notable for the following components:      Result Value   Sodium 133 (*)    Chloride 95 (*)    Glucose, Bld 143 (*)    All other components within normal limits  CBC - Abnormal; Notable for the following components:   WBC 15.4 (*)    All other components within normal limits  URINALYSIS, ROUTINE W REFLEX MICROSCOPIC - Abnormal; Notable for the following components:   Leukocytes,Ua SMALL (*)    Bacteria, UA RARE (*)    All other components within normal limits  CBG MONITORING, ED - Abnormal; Notable for the following components:   Glucose-Capillary 138 (*)    All other components within normal limits  URINE CULTURE    EKG: None  Radiology: DG Chest 2 View Result Date: 01/07/2024 CLINICAL DATA:  Short of breath, abnormal x-ray EXAM: CHEST - 2 VIEW COMPARISON:  01/07/2024 FINDINGS: Frontal and lateral views of the chest demonstrate a stable cardiac silhouette. No acute airspace disease, effusion, or pneumothorax. No acute bony abnormalities. IMPRESSION: 1. No acute intrathoracic process. Electronically Signed   By: Bobbye Burrow M.D.   On: 01/07/2024 21:00   DG Chest Port 1 View Result Date: 01/07/2024 CLINICAL DATA:  sob EXAM: PORTABLE CHEST 1 VIEW COMPARISON:  Chest x-ray 04/29/2004 FINDINGS: The heart and mediastinal contours are within normal limits. Left pericardiac fat again noted. Question retrocardiac airspace opacity. No pulmonary edema. No pleural effusion. No pneumothorax. No acute osseous abnormality. IMPRESSION: Question retrocardiac airspace opacity. Recommend chest x-ray PA and lateral view for further evaluation. Electronically Signed   By: Morgane  Naveau M.D.   On: 01/07/2024 19:47   CT Head Wo Contrast Result Date: 01/07/2024 CLINICAL DATA:  Syncope/presyncope, cerebrovascular cause suspected fell outside but denies any pain. She states she laid outside x 2 hours before someone found her. Pt states the last two days  she stayed in bed with very little to eat or drink. Ems states she is ortho-hypotensive EXAM: CT HEAD WITHOUT CONTRAST TECHNIQUE: Contiguous axial images were obtained from the base of the skull through the vertex without intravenous contrast. RADIATION DOSE REDUCTION: This exam was performed according to the departmental dose-optimization program which includes automated exposure control, adjustment of the mA and/or kV according to patient size and/or use of iterative reconstruction technique. COMPARISON:  CT head 02/05/2016 and 11/07/2006 report without imaging FINDINGS: Brain: No evidence of large-territorial acute infarction. No parenchymal hemorrhage. No mass lesion. No extra-axial collection. Calcifications noted along the left cerebellar hemisphere (similar findings are mentioned on prior CT head is). No mass effect or midline shift. No hydrocephalus. Basilar cisterns are patent. Vascular: No hyperdense vessel. A Atherosclerotic  calcifications are present within the cavernous internal carotid arteries. Skull: No acute fracture or focal lesion. Sinuses/Orbits: Paranasal sinuses and mastoid air cells are clear. Bilateral lens replacement. Otherwise the orbits are unremarkable. Other: None. IMPRESSION: No acute intracranial abnormality. Electronically Signed   By: Morgane  Naveau M.D.   On: 01/07/2024 19:38    {Document cardiac monitor, telemetry assessment procedure when appropriate:32947} Procedures   Medications Ordered in the ED  cefTRIAXone (ROCEPHIN) 2 g in sodium chloride  0.9 % 100 mL IVPB (2 g Intravenous New Bag/Given 01/07/24 2141)  sodium chloride  0.9 % bolus 2,000 mL (0 mLs Intravenous Stopped 01/07/24 2134)      {Click here for ABCD2, HEART and other calculators REFRESH Note before signing:1}                              Medical Decision Making Amount and/or Complexity of Data Reviewed Labs: ordered. Radiology: ordered. ECG/medicine tests: ordered.  Risk Prescription drug  management.  Dehydration and UTI with near syncopal episode.  Patient is started on antibiotics and has been hydrated with fluids will follow-up with PCP {Document critical care time when appropriate  Document review of labs and clinical decision tools ie [id number], etc  Document your independent review of radiology images and any outside records  Document your discussion with family members, caretakers and with consultants  Document social determinants of health affecting pt's care  Document your decision making why or why not admission, treatments were needed:32947:::1}   Final diagnoses:  Syncope and collapse    ED Discharge Orders     None

## 2024-01-07 NOTE — ED Notes (Signed)
 Changed pt brief, pt is now resting

## 2024-01-09 DIAGNOSIS — I10 Essential (primary) hypertension: Secondary | ICD-10-CM | POA: Diagnosis not present

## 2024-01-09 DIAGNOSIS — N390 Urinary tract infection, site not specified: Secondary | ICD-10-CM | POA: Diagnosis not present

## 2024-01-09 DIAGNOSIS — Z79899 Other long term (current) drug therapy: Secondary | ICD-10-CM | POA: Diagnosis not present

## 2024-01-09 DIAGNOSIS — D72829 Elevated white blood cell count, unspecified: Secondary | ICD-10-CM | POA: Diagnosis not present

## 2024-01-09 DIAGNOSIS — E785 Hyperlipidemia, unspecified: Secondary | ICD-10-CM | POA: Diagnosis not present

## 2024-01-09 DIAGNOSIS — Z91048 Other nonmedicinal substance allergy status: Secondary | ICD-10-CM | POA: Diagnosis not present

## 2024-01-09 DIAGNOSIS — E119 Type 2 diabetes mellitus without complications: Secondary | ICD-10-CM | POA: Diagnosis not present

## 2024-01-09 DIAGNOSIS — Z794 Long term (current) use of insulin: Secondary | ICD-10-CM | POA: Diagnosis not present

## 2024-01-09 DIAGNOSIS — R55 Syncope and collapse: Secondary | ICD-10-CM | POA: Diagnosis not present

## 2024-01-09 DIAGNOSIS — E876 Hypokalemia: Secondary | ICD-10-CM | POA: Diagnosis not present

## 2024-01-09 DIAGNOSIS — R42 Dizziness and giddiness: Secondary | ICD-10-CM | POA: Diagnosis not present

## 2024-01-09 DIAGNOSIS — D649 Anemia, unspecified: Secondary | ICD-10-CM | POA: Diagnosis not present

## 2024-01-09 LAB — URINE CULTURE: Culture: <10000 — AB

## 2024-02-01 ENCOUNTER — Other Ambulatory Visit: Payer: Self-pay | Admitting: Nurse Practitioner

## 2024-03-06 DIAGNOSIS — I10 Essential (primary) hypertension: Secondary | ICD-10-CM | POA: Diagnosis not present

## 2024-03-06 DIAGNOSIS — R42 Dizziness and giddiness: Secondary | ICD-10-CM | POA: Diagnosis not present

## 2024-03-06 DIAGNOSIS — Z299 Encounter for prophylactic measures, unspecified: Secondary | ICD-10-CM | POA: Diagnosis not present

## 2024-03-18 ENCOUNTER — Ambulatory Visit: Admitting: Nurse Practitioner

## 2024-04-04 ENCOUNTER — Ambulatory Visit (INDEPENDENT_AMBULATORY_CARE_PROVIDER_SITE_OTHER): Admitting: Nurse Practitioner

## 2024-04-04 ENCOUNTER — Encounter: Payer: Self-pay | Admitting: Nurse Practitioner

## 2024-04-04 VITALS — BP 118/80 | HR 91 | Ht 62.0 in | Wt 170.0 lb

## 2024-04-04 DIAGNOSIS — Z794 Long term (current) use of insulin: Secondary | ICD-10-CM | POA: Diagnosis not present

## 2024-04-04 DIAGNOSIS — Z7985 Long-term (current) use of injectable non-insulin antidiabetic drugs: Secondary | ICD-10-CM | POA: Diagnosis not present

## 2024-04-04 DIAGNOSIS — E782 Mixed hyperlipidemia: Secondary | ICD-10-CM

## 2024-04-04 DIAGNOSIS — E559 Vitamin D deficiency, unspecified: Secondary | ICD-10-CM

## 2024-04-04 DIAGNOSIS — E1165 Type 2 diabetes mellitus with hyperglycemia: Secondary | ICD-10-CM | POA: Diagnosis not present

## 2024-04-04 DIAGNOSIS — I10 Essential (primary) hypertension: Secondary | ICD-10-CM | POA: Diagnosis not present

## 2024-04-04 LAB — POCT GLYCOSYLATED HEMOGLOBIN (HGB A1C): Hemoglobin A1C: 6.8 % — AB (ref 4.0–5.6)

## 2024-04-04 LAB — POCT UA - MICROALBUMIN

## 2024-04-04 MED ORDER — TRESIBA FLEXTOUCH 100 UNIT/ML ~~LOC~~ SOPN: 20.0000 [IU] | PEN_INJECTOR | Freq: Every day | SUBCUTANEOUS | Status: DC

## 2024-04-04 MED ORDER — PEN NEEDLES 31G X 6 MM MISC: 1.0000 | Freq: Four times a day (QID) | 1 refills | Status: AC

## 2024-04-04 MED ORDER — SEMAGLUTIDE (2 MG/DOSE) 8 MG/3ML ~~LOC~~ SOPN: 2.0000 mg | PEN_INJECTOR | SUBCUTANEOUS | 1 refills | Status: DC

## 2024-04-04 NOTE — Progress Notes (Signed)
 04/04/2024  Endocrinology follow-up note  Subjective:    Patient ID: Carrie Turner, female    DOB: Jul 15, 1951.  She is being seen in follow-up  for management of diabetes requested by Maree Isles, MD  Past Medical History:  Diagnosis Date   Anxiety    Arthritis    COPD (chronic obstructive pulmonary disease) (HCC)    Delayed gastric emptying    GES completed on 08/27/2019 revealed borderline delayed gastric emptying; 87% emptying at 4 hours (normal greater than 90%)   Dementia (HCC) 09/30/2019   per patient, early dementia diagnosed after seeing PCP recently   Depression    Diabetes mellitus, type II (HCC)    GERD (gastroesophageal reflux disease)    Glaucoma    Hyperlipidemia    Hypertension    Migraines    Neuropathy    Seizures (HCC)    seizure was from ETOH, a long time ago, no med and no recurrance   Sleep apnea    Vitamin D  deficiency     Past Surgical History:  Procedure Laterality Date   ABDOMINAL HYSTERECTOMY     BIOPSY  06/07/2023   Procedure: BIOPSY;  Surgeon: Shaaron Lamar HERO, MD;  [facility]: [facility];  Service: Endoscopy;;   CATARACT EXTRACTION W/PHACO Left 07/24/2019   Procedure: CATARACT EXTRACTION PHACO AND INTRAOCULAR LENS PLACEMENT LEFT EYE  (CDE: 5.60);  Surgeon: Harrie Agent, MD;  Location: [facility];  Service: Ophthalmology;  Laterality: Left;   CATARACT EXTRACTION W/PHACO Right 08/19/2019   Procedure: CATARACT EXTRACTION PHACO AND INTRAOCULAR LENS PLACEMENT RIGHT EYE;  Surgeon: Harrie Agent, MD;  Location: [facility];  Service: Ophthalmology;  Laterality: Right;  CDE: 8.83   CHOLECYSTECTOMY N/A 12/25/2019   Procedure: LAPAROSCOPIC CHOLECYSTECTOMY;  Surgeon: Mavis Anes, MD;  Location: [facility];  Service: General;  Laterality: N/A;   COLONOSCOPY  11/2015   Dr. Donnel: sessile polyp removed (benign). advised repeat colonoscopy in 5 years.    COLONOSCOPY WITH PROPOFOL  N/A 10/26/2017   RMR: Tubular adenoma removed.  Random colon biopsies negative.   Nonbleeding internal hemorrhoids.  Next colonoscopy in 5 years.   COLONOSCOPY WITH PROPOFOL  N/A 06/07/2023   Procedure: COLONOSCOPY WITH PROPOFOL ;  Surgeon: Shaaron Lamar HERO, MD;  [facility];  Service: Endoscopy;  Laterality: N/A;  10:00 am, asa 3   ESOPHAGEAL DILATION  10/26/2017   Procedure: ESOPHAGEAL DILATION;  Surgeon: Shaaron Lamar HERO, MD;  [facility];  Service: Endoscopy;;   ESOPHAGOGASTRODUODENOSCOPY  11/2015   Dr. Donnel: hiatal hernia   ESOPHAGOGASTRODUODENOSCOPY (EGD) WITH PROPOFOL  N/A 10/26/2017   RMR: Erosive reflux esophagitis, hiatal hernia.  Small bowel biopsies negative.   ESOPHAGOGASTRODUODENOSCOPY (EGD) WITH PROPOFOL  N/A 04/15/2021   Procedure: ESOPHAGOGASTRODUODENOSCOPY (EGD) WITH PROPOFOL ;  Surgeon: Shaaron Lamar HERO, MD;  [facility];  Service: Endoscopy;  Laterality: N/A;  8:45am - LM to see if pt could move up CY   ESOPHAGOGASTRODUODENOSCOPY (EGD) WITH PROPOFOL  N/A 06/07/2023   Procedure: ESOPHAGOGASTRODUODENOSCOPY (EGD) WITH PROPOFOL ;  Surgeon: Shaaron Lamar HERO, MD;  [facility];  Service: Endoscopy;  Laterality: N/A;   INSERTION OF ANTERIOR SEGMENT AQUEOUS DRAINAGE DEVICE (ISTENT) Left 07/24/2019   Procedure: INSERTION OF ANTERIOR SEGMENT AQUEOUS DRAINAGE DEVICE (ISTENT) LEFT EYE;  Surgeon: Harrie Agent, MD;  Location: [facility];  Service: Ophthalmology;  Laterality: Left;   INSERTION OF ANTERIOR SEGMENT AQUEOUS DRAINAGE DEVICE (ISTENT) Right 08/19/2019   Procedure: INSERTION OF ANTERIOR SEGMENT AQUEOUS DRAINAGE DEVICE (ISTENT) RIGHT EYE;  Surgeon: Harrie Agent, MD;  Location: [facility];  Service: Ophthalmology;  Laterality: Right;   KNEE ARTHROSCOPY Right    MALONEY DILATION N/A 04/15/2021   Procedure: AGAPITO DILATION;  Surgeon: Shaaron Lamar HERO, MD;  [facility];  Service: Endoscopy;  Laterality: N/A;   MALONEY DILATION N/A 06/07/2023   Procedure: AGAPITO DILATION;  Surgeon: Shaaron Lamar HERO, MD;  [facility];   Service: Endoscopy;  Laterality: N/A;   ORIF ANKLE FRACTURE Right    POLYPECTOMY  06/07/2023   Procedure: POLYPECTOMY;  Surgeon: Shaaron Lamar HERO, MD;  [facility];  Service: Endoscopy;;     Social History   Socioeconomic History   Marital status: Divorced    Spouse name: Not on file   Number of children: Not on file   Years of education: Not on file   Highest education level: Not on file  Occupational History   Not on file  Tobacco Use   Smoking status: Never   Smokeless tobacco: Never  Vaping Use   Vaping status: Never Used  Substance and Sexual Activity   Alcohol  use: No   Drug use: No   Sexual activity: Not Currently  Other Topics Concern   Not on file  Social History Narrative   Not on file   Social Drivers of Health   Financial Resource Strain: Not on file  Food Insecurity: Not on file  Transportation Needs: Not on file  Physical Activity: Not on file  Stress: Not on file  Social Connections: Socially Isolated (04/29/2021)   Received [facility]   Social Connection and Isolation Panel    In a typical week, how many times do you talk on the phone with family, friends, or neighbors?: More than three times a week    How often do you get together with friends or relatives?: Once a week    How often do you attend church or religious services?: Never    Do you belong to any clubs or organizations such as church groups, unions, fraternal or athletic groups, or school groups?: No    How often do you attend meetings of the clubs or organizations you belong to?: Never    Are you married, widowed, divorced, separated, never married, or living with a partner?: Divorced  Intimate Partner Violence: Not on file    Current Outpatient Medications on File Prior to Visit  Medication Sig Dispense Refill   albuterol (VENTOLIN HFA) 108 (90 Base) MCG/ACT inhaler Inhale 1-2 puffs into the lungs every 6 (six) hours as needed for wheezing or shortness of breath.      amLODipine (NORVASC) 5 MG tablet Take 5 mg by mouth daily.      aspirin EC 81 MG tablet Take 81 mg by mouth daily.     Blood Glucose Monitoring Suppl (ONETOUCH VERIO) w/Device KIT 1 each by Does not apply route in the morning, at noon, in the evening, and at bedtime. 1 kit 0   cephALEXin  (KEFLEX ) 500 MG capsule Take 1 capsule (500 mg total) by mouth 4 (four) times daily. 28 capsule 0   Cholecalciferol (VITAMIN D3) 2000 units TABS Take 2,000 Units by mouth daily.     dexlansoprazole  (DEXILANT ) 60 MG capsule Take 1 capsule (60 mg total) by mouth daily. 90 capsule 3   donepezil (ARICEPT) 10 MG tablet Take 10 mg by mouth daily.     DULoxetine (CYMBALTA) 60 MG capsule Take 60 mg by mouth daily.     gabapentin (NEURONTIN) 800 MG tablet  Take 800 mg by mouth 3 (three) times daily.     glucose blood (ONETOUCH VERIO) test strip use to check glucose two times daily 200 each 2   Insulin  Pen Needle (GLOBAL EASE INJECT PEN NEEDLES) 31G X 5 MM MISC use up to 4 times a day as directed. 200 each 3   linaclotide  (LINZESS ) 72 MCG capsule Take 1 capsule (72 mcg total) by mouth daily before breakfast. 30 capsule 11   lisinopril-hydrochlorothiazide (PRINZIDE,ZESTORETIC) 20-12.5 MG tablet Take 2 tablets by mouth daily.      metoCLOPramide (REGLAN) 5 MG tablet Take 5 mg by mouth in the morning and at bedtime.     ondansetron  (ZOFRAN -ODT) 4 MG disintegrating tablet Take 1 tablet (4 mg total) by mouth every 8 (eight) hours as needed for nausea or vomiting. 30 tablet 11   potassium chloride  (KLOR-CON  M) 10 MEQ tablet Take 10 mEq by mouth daily.     rosuvastatin (CRESTOR) 20 MG tablet Take 20 mg by mouth at bedtime.     sertraline (ZOLOFT) 100 MG tablet Take 100 mg by mouth 2 (two) times daily.     traZODone (DESYREL) 50 MG tablet Take 50 mg by mouth at bedtime.     zolpidem (AMBIEN) 10 MG tablet Take 10 mg by mouth at bedtime as needed.     vitamin B-12 (CYANOCOBALAMIN) 500 MCG tablet Take 500 mcg by mouth daily.      No current facility-administered medications on file prior to visit.    No facility-administered encounter medications on file as of 10/16/2018.    Allergies  Allergen Reactions   Sulfa Antibiotics Other (See Comments)    Weakness and fatigued      Diabetes She presents for her follow-up diabetic visit. She has type 2 diabetes mellitus. Onset time: diagnosed at approximate age of 33. Her disease course has been stable. There are no hypoglycemic associated symptoms. Pertinent negatives for hypoglycemia include no confusion, mood changes or seizures. Associated symptoms include fatigue and foot paresthesias. Pertinent negatives for diabetes include no polydipsia, no polyphagia, no polyuria and no weight loss. There are no hypoglycemic complications. Symptoms are improving. Diabetic complications include peripheral neuropathy. Risk factors for coronary artery disease include diabetes mellitus, dyslipidemia, hypertension, obesity, post-menopausal and sedentary lifestyle. Current diabetic treatment includes insulin  injections (and Ozempic ). She is compliant with treatment most of the time. Her weight is fluctuating minimally. She is following a generally healthy diet. When asked about meal planning, she reported none. She has had a previous visit with a dietitian. She rarely participates in exercise. Her home blood glucose trend is fluctuating minimally. Her overall blood glucose range is 140-180 mg/dl. (She presents today with her meter and logs showing inconsistent monitoring but stable, at target glycemic profile overall.  Her POCT A1c today is 6.8%, increasing slightly from last visit of 6.6%.  She denies any hypoglycemia.  She has gained some weight since last visit which she is bummed about but admits she has been giving in to her sugar cravings lately.  Analysis of her meter shows 7-day average of 108 with 4 readings; 14-day average of 128 with 9 readings, 30-day average of 140 with 20 readings,  90-day average of 141 with 49 readings.) An ACE inhibitor/angiotensin II receptor blocker is being taken. She does not see a podiatrist.Eye exam is current.     Review of systems  Constitutional: + stable body weight,  current Body mass index is 31.09 kg/m. ,  no fatigue, no subjective hyperthermia, no  subjective hypothermia Eyes: no blurry vision, no xerophthalmia ENT: no sore throat, no nodules palpated in throat, no dysphagia/odynophagia, no hoarseness Cardiovascular: no chest pain, no shortness of breath, no palpitations, no leg swelling Respiratory: no cough, no shortness of breath Gastrointestinal: no nausea/vomiting, no diarrhea or constipation Musculoskeletal: no muscle/joint aches Skin: no rashes, no hyperemia Neurological: no tremors, no numbness, no tingling, no dizziness Psychiatric: no depression, no anxiety   Objective:    BP 118/80 (BP Location: Left Arm, Patient Position: Sitting, Cuff Size: Large)   Pulse 91   Ht 5' 2 (1.575 m)   Wt 170 lb (77.1 kg)   BMI 31.09 kg/m   Wt Readings from Last 3 Encounters:  04/04/24 170 lb (77.1 kg)  01/07/24 171 lb 15.3 oz (78 kg)  11/13/23 173 lb 6.4 oz (78.7 kg)   BP Readings from Last 3 Encounters:  04/04/24 118/80  01/07/24 (!) 156/85  11/13/23 130/76     Physical Exam- Limited  Constitutional:  Body mass index is 31.09 kg/m. , not in acute distress, normal state of mind Eyes:  EOMI, no exophthalmos Musculoskeletal: no gross deformities, strength intact in all four extremities, no gross restriction of joint movements Skin:  no rashes, no hyperemia Neurological: no tremor with outstretched hands    Diabetic Foot Exam - Simple   Simple Foot Form Visual Inspection No deformities, no ulcerations, no other skin breakdown bilaterally: Yes Sensation Testing Intact to touch and monofilament testing bilaterally: Yes Pulse Check Posterior Tibialis and Dorsalis pulse intact bilaterally: Yes Comments     Lipid  Panel     Component Value Date/Time   CHOL 383 (A) 02/17/2021 0000   TRIG 373 (A) 02/17/2021 0000   HDL 61 02/17/2021 0000   LDLCALC 247 02/17/2021 0000     Assessment & Plan:   1) Controlled type 2 diabetes mellitus with complication, with long-term current use of insulin  (HCC)  - Patient has currently uncontrolled symptomatic type 2 DM since 73 years of age.  She presents today with her meter and logs showing inconsistent monitoring but stable, at target glycemic profile overall.  Her POCT A1c today is 6.8%, increasing slightly from last visit of 6.6%.  She denies any hypoglycemia.  She has gained some weight since last visit which she is bummed about but admits she has been giving in to her sugar cravings lately.  Analysis of her meter shows 7-day average of 108 with 4 readings; 14-day average of 128 with 9 readings, 30-day average of 140 with 20 readings, 90-day average of 141 with 49 readings.   - Recent labs are reviewed.    Her diabetes is complicated by noncompliance/nonadherence, obesity and sedentary life and patient remains at a high risk for more acute and chronic complications of diabetes which include CAD, CVA, CKD, retinopathy, and neuropathy. These are all discussed in detail with the patient.  - Nutritional counseling repeated at each appointment due to patients tendency to fall back in to old habits.  - The patient admits there is a room for improvement in their diet and drink choices. -  Suggestion is made for the patient to avoid simple carbohydrates from their diet including Cakes, Sweet Desserts / Pastries, Ice Cream, Soda (diet and regular), Sweet Tea, Candies, Chips, Cookies, Sweet Pastries, Store Bought Juices, Alcohol  in Excess of 1-2 drinks a day, Artificial Sweeteners, Coffee Creamer, and Sugar-free Products. This will help patient to have stable blood glucose profile and potentially avoid unintended weight gain.   -  I encouraged the patient to switch to  unprocessed or minimally processed complex starch and increased protein intake (animal or plant source), fruits, and vegetables.   - Patient is advised to stick to a routine mealtimes to eat 3 meals a day and avoid unnecessary snacks (to snack only to correct hypoglycemia).  - I have approached patient with the following individualized plan to manage diabetes and patient agrees:   -she is struggling to achieve control of diabetes, likely deals with moderate cognitive deficit. Avoiding hypoglycemia is the #1 priority in her case.   -She is advised to continue Ozempic  2 mg SQ weekly and continue Tresiba  to 20 units SQ daily.   -She does not tolerate Metformin  due to GI side effects.  -She is encouraged to start consistently monitoring blood glucose twice daily, before breakfast and before bed, and to call the clinic if she has readings less than 70 or above 300 for 3 tests in a row.  -She is not a good candidate for incretin therapy due to her hypertriglyceridemia increasing her risk of pancreatitis.    2) Lipids/HPL:  Her most recent lipid panel from 11/08/23 shows uncontrolled LDL of 122 and high triglycerides of 193 (improving).  She is advised to continue Crestor 10 mg po daily at bedtime and continue Fenofibrate  145 mg po daily.  I instructed her to avoid fried foods.  3) Hypertension: -Her blood pressure is controlled to target.  She is advised to continue Norvasc 5 mg po daily, Lisinopril-HCT 20/12.5 mg po daily and follow up with Dr. Maree regarding her meds.   She is advised to continue follow-up closely with Dr. Maree for primary care needs.     I spent  20  minutes in the care of the patient today including review of labs from CMP, Lipids, Thyroid  Function, Hematology (current and previous including abstractions from other facilities); face-to-face time discussing  her blood glucose readings/logs, discussing hypoglycemia and hyperglycemia episodes and symptoms, medications doses,  her options of short and long term treatment based on the latest standards of care / guidelines;  discussion about incorporating lifestyle medicine;  and documenting the encounter. Risk reduction counseling performed per USPSTF guidelines to reduce obesity and cardiovascular risk factors.     Please refer to Patient Instructions for Blood Glucose Monitoring and Insulin /Medications Dosing Guide  in media tab for additional information. Please  also refer to  Patient Self Inventory in the Media  tab for reviewed elements of pertinent patient history.  Hargis MARLA Cart participated in the discussions, expressed understanding, and voiced agreement with the above plans.  All questions were answered to her satisfaction. she is encouraged to contact clinic should she have any questions or concerns prior to her return visit.   Follow up plan: Return in about 4 months (around 08/04/2024) for Diabetes F/U with A1c in office.   Benton Rio, [facility] [address] [place], [place] [address] Phone: [phone] Fax: [phone]

## 2024-04-23 ENCOUNTER — Other Ambulatory Visit: Payer: Self-pay | Admitting: Nurse Practitioner

## 2024-05-01 DIAGNOSIS — R632 Polyphagia: Secondary | ICD-10-CM | POA: Diagnosis not present

## 2024-05-01 DIAGNOSIS — E1143 Type 2 diabetes mellitus with diabetic autonomic (poly)neuropathy: Secondary | ICD-10-CM | POA: Diagnosis not present

## 2024-05-01 DIAGNOSIS — R350 Frequency of micturition: Secondary | ICD-10-CM | POA: Diagnosis not present

## 2024-05-01 DIAGNOSIS — Z299 Encounter for prophylactic measures, unspecified: Secondary | ICD-10-CM | POA: Diagnosis not present

## 2024-05-01 DIAGNOSIS — I10 Essential (primary) hypertension: Secondary | ICD-10-CM | POA: Diagnosis not present

## 2024-05-26 ENCOUNTER — Other Ambulatory Visit: Payer: Self-pay | Admitting: Nurse Practitioner

## 2024-06-25 ENCOUNTER — Encounter

## 2024-06-25 ENCOUNTER — Other Ambulatory Visit: Payer: Self-pay | Admitting: Internal Medicine

## 2024-06-25 DIAGNOSIS — Z1231 Encounter for screening mammogram for malignant neoplasm of breast: Secondary | ICD-10-CM

## 2024-07-16 ENCOUNTER — Other Ambulatory Visit: Payer: Self-pay | Admitting: Nurse Practitioner

## 2024-07-23 ENCOUNTER — Other Ambulatory Visit: Payer: Self-pay

## 2024-07-23 MED ORDER — DEXLANSOPRAZOLE 60 MG PO CPDR: 60.0000 mg | DELAYED_RELEASE_CAPSULE | Freq: Every day | ORAL | 0 refills | Status: AC

## 2024-08-06 ENCOUNTER — Ambulatory Visit: Admitting: Nurse Practitioner

## 2024-08-06 DIAGNOSIS — Z794 Long term (current) use of insulin: Secondary | ICD-10-CM

## 2024-08-06 DIAGNOSIS — E559 Vitamin D deficiency, unspecified: Secondary | ICD-10-CM

## 2024-08-06 DIAGNOSIS — I10 Essential (primary) hypertension: Secondary | ICD-10-CM

## 2024-08-06 DIAGNOSIS — Z7985 Long-term (current) use of injectable non-insulin antidiabetic drugs: Secondary | ICD-10-CM

## 2024-08-06 DIAGNOSIS — E782 Mixed hyperlipidemia: Secondary | ICD-10-CM

## 2024-08-20 ENCOUNTER — Ambulatory Visit: Admitting: Internal Medicine

## 2024-08-27 ENCOUNTER — Ambulatory Visit: Admitting: Internal Medicine

## 2024-09-16 ENCOUNTER — Ambulatory Visit: Admitting: Internal Medicine

## 2024-11-13 ENCOUNTER — Ambulatory Visit: Admitting: Nurse Practitioner
